# Patient Record
Sex: Female | Born: 1945 | Race: Black or African American | Hispanic: No | Smoking: Former smoker
Health system: Southern US, Community
[De-identification: ages and names within clinical notes are randomized; demographics above are authoritative.]

## PROBLEM LIST (undated history)

## (undated) DIAGNOSIS — R7303 Prediabetes: Secondary | ICD-10-CM

## (undated) DIAGNOSIS — H409 Unspecified glaucoma: Secondary | ICD-10-CM

## (undated) DIAGNOSIS — I10 Essential (primary) hypertension: Secondary | ICD-10-CM

## (undated) DIAGNOSIS — H269 Unspecified cataract: Secondary | ICD-10-CM

## (undated) DIAGNOSIS — K219 Gastro-esophageal reflux disease without esophagitis: Secondary | ICD-10-CM

## (undated) DIAGNOSIS — M549 Dorsalgia, unspecified: Secondary | ICD-10-CM

## (undated) DIAGNOSIS — Z972 Presence of dental prosthetic device (complete) (partial): Secondary | ICD-10-CM

## (undated) DIAGNOSIS — Z973 Presence of spectacles and contact lenses: Secondary | ICD-10-CM

## (undated) DIAGNOSIS — K08109 Complete loss of teeth, unspecified cause, unspecified class: Secondary | ICD-10-CM

## (undated) DIAGNOSIS — Z96642 Presence of left artificial hip joint: Secondary | ICD-10-CM

## (undated) DIAGNOSIS — Z8711 Personal history of peptic ulcer disease: Secondary | ICD-10-CM

## (undated) DIAGNOSIS — M1990 Unspecified osteoarthritis, unspecified site: Secondary | ICD-10-CM

## (undated) HISTORY — DX: Unspecified cataract: H26.9

## (undated) HISTORY — DX: Presence of left artificial hip joint: Z96.642

## (undated) HISTORY — PX: COLONOSCOPY: SHX174

## (undated) HISTORY — DX: Unspecified glaucoma: H40.9

## (undated) HISTORY — DX: Personal history of peptic ulcer disease: Z87.11

## (undated) HISTORY — PX: TUBAL LIGATION: SHX77

## (undated) HISTORY — PX: COSMETIC SURGERY: SHX468

## (undated) HISTORY — PX: REDUCTION MAMMAPLASTY: SUR839

---

## 2013-04-20 ENCOUNTER — Encounter (HOSPITAL_BASED_OUTPATIENT_CLINIC_OR_DEPARTMENT_OTHER): Payer: Self-pay | Admitting: *Deleted

## 2013-04-20 NOTE — Progress Notes (Signed)
Pt to come in for bmet-ekg-denies any resp or cardiac problems

## 2013-04-21 ENCOUNTER — Encounter (HOSPITAL_BASED_OUTPATIENT_CLINIC_OR_DEPARTMENT_OTHER)
Admission: RE | Admit: 2013-04-21 | Discharge: 2013-04-21 | Disposition: A | Discharge status: Home / Self Care | Payer: Medicare Other | Source: Ambulatory Visit | Attending: Specialist | Admitting: Specialist

## 2013-04-21 ENCOUNTER — Other Ambulatory Visit: Payer: Self-pay

## 2013-04-21 DIAGNOSIS — Z01818 Encounter for other preprocedural examination: Secondary | ICD-10-CM | POA: Insufficient documentation

## 2013-04-21 DIAGNOSIS — Z01810 Encounter for preprocedural cardiovascular examination: Secondary | ICD-10-CM | POA: Insufficient documentation

## 2013-04-21 DIAGNOSIS — Z01812 Encounter for preprocedural laboratory examination: Secondary | ICD-10-CM | POA: Insufficient documentation

## 2013-04-22 LAB — POCT I-STAT, CHEM 8
BUN: 16 mg/dL (ref 6–23)
Calcium, Ion: 1.1 mmol/L — ABNORMAL LOW (ref 1.13–1.30)
HCT: 38 % (ref 36.0–46.0)
Hemoglobin: 12.9 g/dL (ref 12.0–15.0)
Sodium: 141 mEq/L (ref 135–145)
TCO2: 27 mmol/L (ref 0–100)

## 2013-04-25 ENCOUNTER — Encounter (HOSPITAL_BASED_OUTPATIENT_CLINIC_OR_DEPARTMENT_OTHER)
Admission: RE | Disposition: A | Discharge status: Home / Self Care | Payer: Self-pay | Source: Ambulatory Visit | Attending: Specialist

## 2013-04-25 ENCOUNTER — Ambulatory Visit (HOSPITAL_BASED_OUTPATIENT_CLINIC_OR_DEPARTMENT_OTHER)
Admission: RE | Admit: 2013-04-25 | Discharge: 2013-04-25 | Disposition: A | Discharge status: Home / Self Care | Payer: Medicare Other | Source: Ambulatory Visit | Attending: Specialist | Admitting: Specialist

## 2013-04-25 ENCOUNTER — Encounter (HOSPITAL_BASED_OUTPATIENT_CLINIC_OR_DEPARTMENT_OTHER): Payer: Self-pay | Admitting: Anesthesiology

## 2013-04-25 ENCOUNTER — Ambulatory Visit (HOSPITAL_BASED_OUTPATIENT_CLINIC_OR_DEPARTMENT_OTHER): Payer: Medicare Other | Admitting: Anesthesiology

## 2013-04-25 ENCOUNTER — Encounter (HOSPITAL_BASED_OUTPATIENT_CLINIC_OR_DEPARTMENT_OTHER): Payer: Self-pay | Admitting: *Deleted

## 2013-04-25 DIAGNOSIS — N62 Hypertrophy of breast: Secondary | ICD-10-CM | POA: Insufficient documentation

## 2013-04-25 HISTORY — DX: Gastro-esophageal reflux disease without esophagitis: K21.9

## 2013-04-25 HISTORY — DX: Presence of dental prosthetic device (complete) (partial): Z97.2

## 2013-04-25 HISTORY — DX: Dorsalgia, unspecified: M54.9

## 2013-04-25 HISTORY — DX: Presence of spectacles and contact lenses: Z97.3

## 2013-04-25 HISTORY — DX: Complete loss of teeth, unspecified cause, unspecified class: K08.109

## 2013-04-25 HISTORY — DX: Essential (primary) hypertension: I10

## 2013-04-25 HISTORY — PX: BREAST REDUCTION SURGERY: SHX8

## 2013-04-25 HISTORY — DX: Unspecified osteoarthritis, unspecified site: M19.90

## 2013-04-25 SURGERY — MAMMOPLASTY, REDUCTION
Anesthesia: General | Site: Breast | Laterality: Bilateral | Wound class: Clean

## 2013-04-25 MED ORDER — CEFAZOLIN SODIUM-DEXTROSE 2-3 GM-% IV SOLR
2.0000 g | INTRAVENOUS | Status: AC
  Administered 2013-04-25: 2 g via INTRAVENOUS

## 2013-04-25 MED ORDER — SODIUM CHLORIDE 0.9 % IV SOLN
INTRAVENOUS | Status: DC | PRN
  Administered 2013-04-25: 08:00:00 via INTRAMUSCULAR

## 2013-04-25 MED ORDER — DEXAMETHASONE SODIUM PHOSPHATE 4 MG/ML IJ SOLN
INTRAMUSCULAR | Status: DC | PRN
  Administered 2013-04-25: 10 mg via INTRAVENOUS

## 2013-04-25 MED ORDER — ONDANSETRON HCL 4 MG/2ML IJ SOLN
INTRAMUSCULAR | Status: DC | PRN
  Administered 2013-04-25: 4 mg via INTRAVENOUS

## 2013-04-25 MED ORDER — PHENYLEPHRINE HCL 10 MG/ML IJ SOLN
INTRAMUSCULAR | Status: DC | PRN
  Administered 2013-04-25: 20 ug via INTRAVENOUS

## 2013-04-25 MED ORDER — OXYCODONE HCL 5 MG/5ML PO SOLN: 5.0000 mg | Freq: Once | ORAL | Status: AC | PRN

## 2013-04-25 MED ORDER — HYDROMORPHONE HCL PF 1 MG/ML IJ SOLN
0.2500 mg | INTRAMUSCULAR | Status: DC | PRN
  Administered 2013-04-25: 0.5 mg via INTRAVENOUS

## 2013-04-25 MED ORDER — MIDAZOLAM HCL 5 MG/5ML IJ SOLN
INTRAMUSCULAR | Status: DC | PRN
  Administered 2013-04-25: 1 mg via INTRAVENOUS

## 2013-04-25 MED ORDER — PROPOFOL 10 MG/ML IV BOLUS
INTRAVENOUS | Status: DC | PRN
  Administered 2013-04-25: 120 mg via INTRAVENOUS

## 2013-04-25 MED ORDER — PHENYLEPHRINE HCL 10 MG/ML IJ SOLN
10.0000 mg | INTRAVENOUS | Status: DC | PRN
  Administered 2013-04-25: 50 ug/min via INTRAVENOUS

## 2013-04-25 MED ORDER — MORPHINE SULFATE 10 MG/ML IJ SOLN
INTRAMUSCULAR | Status: DC | PRN
  Administered 2013-04-25: 1 mg via INTRAVENOUS
  Administered 2013-04-25 (×2): 2 mg via INTRAVENOUS

## 2013-04-25 MED ORDER — EPHEDRINE SULFATE 50 MG/ML IJ SOLN
INTRAMUSCULAR | Status: DC | PRN
  Administered 2013-04-25: 10 mg via INTRAVENOUS

## 2013-04-25 MED ORDER — SUCCINYLCHOLINE CHLORIDE 20 MG/ML IJ SOLN
INTRAMUSCULAR | Status: DC | PRN
  Administered 2013-04-25: 80 mg via INTRAVENOUS

## 2013-04-25 MED ORDER — OXYCODONE HCL 5 MG PO TABS
5.0000 mg | ORAL_TABLET | Freq: Once | ORAL | Status: AC | PRN
  Administered 2013-04-25: 5 mg via ORAL

## 2013-04-25 MED ORDER — LACTATED RINGERS IV SOLN
INTRAVENOUS | Status: DC
  Administered 2013-04-25: 07:00:00 via INTRAVENOUS

## 2013-04-25 MED ORDER — LIDOCAINE HCL (CARDIAC) 20 MG/ML IV SOLN
INTRAVENOUS | Status: DC | PRN
  Administered 2013-04-25: 60 mg via INTRAVENOUS

## 2013-04-25 SURGICAL SUPPLY — 59 items
BAG DECANTER FOR FLEXI CONT (MISCELLANEOUS) ×2 IMPLANT
BENZOIN TINCTURE PRP APPL 2/3 (GAUZE/BANDAGES/DRESSINGS) ×4 IMPLANT
BINDER BREAST XLRG (GAUZE/BANDAGES/DRESSINGS) IMPLANT
BINDER BREAST XXLRG (GAUZE/BANDAGES/DRESSINGS) ×2 IMPLANT
BLADE KNIFE  20 PERSONNA (BLADE) ×2
BLADE KNIFE 20 PERSONNA (BLADE) ×2 IMPLANT
BLADE KNIFE PERSONA 15 (BLADE) ×2 IMPLANT
CANISTER SUCTION 1200CC (MISCELLANEOUS) ×2 IMPLANT
CLOTH BEACON ORANGE TIMEOUT ST (SAFETY) ×2 IMPLANT
COVER MAYO STAND STRL (DRAPES) ×2 IMPLANT
COVER TABLE BACK 60X90 (DRAPES) ×2 IMPLANT
DECANTER SPIKE VIAL GLASS SM (MISCELLANEOUS) ×4 IMPLANT
DRAIN CHANNEL 10F 3/8 F FF (DRAIN) ×4 IMPLANT
DRAPE LAPAROSCOPIC ABDOMINAL (DRAPES) ×2 IMPLANT
DRAPE UTILITY XL STRL (DRAPES) ×2 IMPLANT
DRSG PAD ABDOMINAL 8X10 ST (GAUZE/BANDAGES/DRESSINGS) ×8 IMPLANT
ELECT REM PT RETURN 9FT ADLT (ELECTROSURGICAL) ×2
ELECTRODE REM PT RTRN 9FT ADLT (ELECTROSURGICAL) ×1 IMPLANT
EVACUATOR SILICONE 100CC (DRAIN) ×4 IMPLANT
FILTER 7/8 IN (FILTER) IMPLANT
GAUZE XEROFORM 5X9 LF (GAUZE/BANDAGES/DRESSINGS) ×4 IMPLANT
GLOVE BIO SURGEON STRL SZ 6.5 (GLOVE) ×2 IMPLANT
GLOVE BIO SURGEON STRL SZ7 (GLOVE) ×2 IMPLANT
GLOVE BIOGEL M STRL SZ7.5 (GLOVE) IMPLANT
GLOVE BIOGEL PI IND STRL 8 (GLOVE) IMPLANT
GLOVE BIOGEL PI INDICATOR 8 (GLOVE)
GLOVE ECLIPSE 7.0 STRL STRAW (GLOVE) ×2 IMPLANT
GOWN PREVENTION PLUS XXLARGE (GOWN DISPOSABLE) ×4 IMPLANT
IV NS 500ML (IV SOLUTION) ×1
IV NS 500ML BAXH (IV SOLUTION) ×1 IMPLANT
NEEDLE SPNL 18GX3.5 QUINCKE PK (NEEDLE) ×2 IMPLANT
NS IRRIG 1000ML POUR BTL (IV SOLUTION) IMPLANT
PACK BASIN DAY SURGERY FS (CUSTOM PROCEDURE TRAY) ×2 IMPLANT
PEN SKIN MARKING BROAD TIP (MISCELLANEOUS) ×2 IMPLANT
PILLOW FOAM RUBBER ADULT (PILLOWS) ×2 IMPLANT
PIN SAFETY STERILE (MISCELLANEOUS) ×2 IMPLANT
SHEETING SILICONE GEL EPI DERM (MISCELLANEOUS) IMPLANT
SLEEVE SCD COMPRESS KNEE MED (MISCELLANEOUS) ×2 IMPLANT
SLEEVE SURGEON STRL (DRAPES) ×2 IMPLANT
SPECIMEN JAR MEDIUM (MISCELLANEOUS) IMPLANT
SPECIMEN JAR X LARGE (MISCELLANEOUS) ×4 IMPLANT
SPONGE GAUZE 4X4 12PLY (GAUZE/BANDAGES/DRESSINGS) ×4 IMPLANT
SPONGE LAP 18X18 X RAY DECT (DISPOSABLE) ×8 IMPLANT
STRIP SUTURE WOUND CLOSURE 1/2 (SUTURE) ×10 IMPLANT
SUT MNCRL AB 3-0 PS2 18 (SUTURE) ×12 IMPLANT
SUT MON AB 2-0 CT1 36 (SUTURE) IMPLANT
SUT MON AB 5-0 PS2 18 (SUTURE) ×4 IMPLANT
SUT PROLENE 3 0 PS 2 (SUTURE) ×12 IMPLANT
SYR 50ML LL SCALE MARK (SYRINGE) ×4 IMPLANT
SYR CONTROL 10ML LL (SYRINGE) IMPLANT
TAPE HYPAFIX 6X30 (GAUZE/BANDAGES/DRESSINGS) ×2 IMPLANT
TAPE MEASURE 72IN RETRACT (INSTRUMENTS) ×1
TAPE MEASURE LINEN 72IN RETRCT (INSTRUMENTS) ×1 IMPLANT
TAPE PAPER MEDFIX 1IN X 10YD (GAUZE/BANDAGES/DRESSINGS) ×2 IMPLANT
TOWEL OR NON WOVEN STRL DISP B (DISPOSABLE) ×2 IMPLANT
TUBE CONNECTING 20X1/4 (TUBING) ×2 IMPLANT
UNDERPAD 30X30 INCONTINENT (UNDERPADS AND DIAPERS) ×6 IMPLANT
VAC PENCILS W/TUBING CLEAR (MISCELLANEOUS) ×2 IMPLANT
YANKAUER SUCT BULB TIP NO VENT (SUCTIONS) ×4 IMPLANT

## 2013-04-25 NOTE — Transfer of Care (Signed)
Immediate Anesthesia Transfer of Care Note  [name]  Procedure(s) Performed: Procedure(s): MAMMARY REDUCTION  (BREAST) (Bilateral)  Patient Location: PACU  Anesthesia Type:General  Level of Consciousness: awake and sedated  Airway & Oxygen Therapy: Patient Spontanous Breathing and Patient connected to face mask oxygen  Post-op Assessment: Report given to PACU RN and Post -op Vital signs reviewed and stable  Post vital signs: Reviewed and stable  Complications: No apparent anesthesia complications

## 2013-04-25 NOTE — Op Note (Signed)
NAMEGOWRI, SUCHAN                   ACCOUNT NO.:  [id number]  MEDICAL RECORD NO.:  [id number]  LOCATION:                               FACILITY:  [facility]  PHYSICIAN:  Earvin Hansen L. Shon Hough, M.D.DATE OF BIRTH:  Feb 20, 1946  DATE OF PROCEDURE:  04/25/2013 DATE OF DISCHARGE:  04/25/2013                              OPERATIVE REPORT   INDICATIONS FOR PROCEDURE:  A 67 year old lady with severe macromastia, back and shoulder pain secondary to large pendulous breasts, as well as increased accessory breast tissue.  PROCEDURES DONE:  Bilateral breast reductions using the inferior pedicle technique, excision of accessory breast tissue bilaterally.  ANESTHESIA:  General.  DESCRIPTION OF PROCEDURE:  Preoperatively, the patient sat up and drawn for the reduction mammoplasty reducing the distance from the nipple- areolar complex to the suprasternal notch from over 40 cm to 25.  She underwent general anesthesia intubated orally and prep was done to the chest and breast areas in routine fashion using Hibiclens soap and solution walled off with sterile towels and drapes so as to make a sterile field.  One 4% Xylocaine with epinephrine 1:400,000 concentration was injected locally, a total of 200 mL per side.  This was allowed to set up.  The wounds were scored with #15 blade and the skin of the inferior pedicle was de-epithelialized with #20 blades. Medial and lateral fatty dermal pedicles were incised down the underlying pectoralis major fascia.  After proper hemostasis, the new keyhole area was also debulked, as well as the inferior pedicle.  After proper hemostasis, the flaps were transposed and stayed with 3-0 Monocryl sutures.  Subcutaneous closure was done with 3-0 Monocryl x2 layers and running subcuticular stitch of 3-0 Monocryl and 5-0 Monocryl throughout the inverted T.  The wounds were drained with #10 fully fluted Blake drains which were placed in the drape in the lateral portion of the  incision and secured with 3-0 Prolene.  The patient underwent the procedures very well.  ESTIMATED BLOOD LOSS:  Less than 100 mL.  The wounds were cleansed and covered with half-inch Steri-Strips, Xeroform 4x4s, ABD pads.  Nipple- areolar complexes at the end of procedure showing excellent suppleness and blood supply pinkness.  She was taken to recovery in excellent condition.  ESTIMATED BLOOD LOSS:  Less than 150 mL.  COMPLICATIONS:  None.     Yaakov Guthrie. Shon Hough, M.D.   ______________________________ Yaakov Guthrie. Shon Hough, M.D.    Cathie Hoops  D:  04/25/2013  T:  04/25/2013  Job:  [id number]

## 2013-04-25 NOTE — Anesthesia Preprocedure Evaluation (Signed)
Anesthesia Evaluation  Patient identified by MRN, date of birth, ID band Patient awake    Reviewed: Allergy & Precautions, H&P , NPO status , Patient's Chart, lab work & pertinent test results  Airway Mallampati: II TM Distance: >3 FB Neck ROM: Full    Dental no notable dental hx. (+) Edentulous Upper, Edentulous Lower and Dental Advisory Given   Pulmonary neg pulmonary ROS,  breath sounds clear to auscultation  Pulmonary exam normal       Cardiovascular hypertension, On Medications Rhythm:Regular Rate:Normal     Neuro/Psych negative neurological ROS  negative psych ROS   GI/Hepatic Neg liver ROS, GERD-  Medicated and Controlled,  Endo/Other  negative endocrine ROS  Renal/GU negative Renal ROS  negative genitourinary   Musculoskeletal   Abdominal   Peds  Hematology negative hematology ROS (+)   Anesthesia Other Findings   Reproductive/Obstetrics negative OB ROS                           Anesthesia Physical Anesthesia Plan  ASA: II  Anesthesia Plan: General   Post-op Pain Management:    Induction: Intravenous  Airway Management Planned: Oral ETT  Additional Equipment:   Intra-op Plan:   Post-operative Plan: Extubation in OR  Informed Consent: I have reviewed the patients History and Physical, chart, labs and discussed the procedure including the risks, benefits and alternatives for the proposed anesthesia with the patient or authorized representative who has indicated his/her understanding and acceptance.   Dental advisory given  Plan Discussed with: CRNA  Anesthesia Plan Comments:         Anesthesia Quick Evaluation

## 2013-04-25 NOTE — Anesthesia Postprocedure Evaluation (Signed)
  Anesthesia Post-op Note  [name]  Procedure(s) Performed: Procedure(s): MAMMARY REDUCTION  (BREAST) (Bilateral)  Patient Location: PACU  Anesthesia Type:General  Level of Consciousness: awake, alert  and oriented  Airway and Oxygen Therapy: Patient Spontanous Breathing  Post-op Pain: none  Post-op Assessment: Post-op Vital signs reviewed, Patient's Cardiovascular Status Stable, Respiratory Function Stable, Patent Airway and No signs of Nausea or vomiting  Post-op Vital Signs: Reviewed and stable  Complications: No apparent anesthesia complications

## 2013-04-25 NOTE — Anesthesia Procedure Notes (Signed)
Procedure Name: Intubation Performed by: York Grice Pre-anesthesia Checklist: Patient identified, Timeout performed, Emergency Drugs available, Suction available and Patient being monitored Patient Re-evaluated:Patient Re-evaluated prior to inductionOxygen Delivery Method: Circle system utilized Preoxygenation: Pre-oxygenation with 100% oxygen Intubation Type: IV induction Ventilation: Mask ventilation without difficulty Laryngoscope Size: Miller and 2 Grade View: Grade I Tube type: Oral Tube size: 7.0 mm Number of attempts: 1 Airway Equipment and Method: Stylet Placement Confirmation: positive ETCO2,  breath sounds checked- equal and bilateral and ETT inserted through vocal cords under direct vision Secured at: 22 cm Tube secured with: Tape Dental Injury: Teeth and Oropharynx as per pre-operative assessment

## 2013-04-25 NOTE — H&P (Signed)
Tiffany Bradley is an 67 y.o. female.   Chief Complaint: Increased macromastia HPI: Increased back and shoulder pain increased accessory breast tissue  Past Medical History  Diagnosis Date  . Arthritis   . Hypertension   . Back pain   . DJD (degenerative joint disease)   . GERD (gastroesophageal reflux disease)   . Wears glasses   . Full dentures     Past Surgical History  Procedure Laterality Date  . Tubal ligation    . Colonoscopy      History reviewed. No pertinent family history. Social History:  reports that she quit smoking about 20 years ago. She does not have any smokeless tobacco history on file. She reports that  drinks alcohol. She reports that she does not use illicit drugs.  Allergies: No Known Allergies  Medications Prior to Admission  Medication Sig Dispense Refill  . acetaminophen-codeine (TYLENOL #3) 300-30 MG per tablet Take 1 tablet by mouth every 4 (four) hours as needed for pain.      Marland Kitchen amLODipine (NORVASC) 10 MG tablet Take 10 mg by mouth daily.      . cyclobenzaprine (FLEXERIL) 10 MG tablet Take 10 mg by mouth 3 (three) times daily as needed for muscle spasms.      Marland Kitchen ibuprofen (ADVIL,MOTRIN) 800 MG tablet Take 800 mg by mouth every 8 (eight) hours as needed for pain.      Marland Kitchen omeprazole (PRILOSEC) 20 MG capsule Take 20 mg by mouth daily.        No results found for this or any previous visit (from the past 48 hour(s)). No results found.  Review of Systems  Constitutional: Negative.   HENT: Positive for neck pain.   Eyes: Negative.   Respiratory: Negative.   Cardiovascular: Negative.   Gastrointestinal: Positive for heartburn.  Genitourinary: Negative.   Musculoskeletal: Positive for back pain.  Skin: Positive for rash.  Neurological: Negative.   Endo/Heme/Allergies: Negative.   Psychiatric/Behavioral: Negative.     Blood pressure 139/99, pulse 94, temperature 98.1 F (36.7 C), temperature source Oral, height 5\' 6"  (1.676 m), weight 82.328 kg (181  lb 8 oz), SpO2 99.00%. Physical Exam   Assessment/Plan Severe macromastia for bilateral breast reductions and excision of accessory breast tissue  Zeke Aker L 04/25/2013, 7:25 AM

## 2013-04-25 NOTE — Brief Op Note (Signed)
04/25/2013  10:21 AM  PATIENT:  Tiffany Bradley  67 y.o. female  PRE-OPERATIVE DIAGNOSIS:  MACROMASTIA  POST-OPERATIVE DIAGNOSIS:  MACROMASTIA  PROCEDURE:  Procedure(s): MAMMARY REDUCTION  (BREAST) (Bilateral)  SURGEON:  Surgeon(s) and Role:    * Louisa Second, MD - Primary  PHYSICIAN ASSISTANT:   ASSISTANTS: none   ANESTHESIA:   general  EBL:  Total I/O In: 1300 [I.V.:1300] Out: -   BLOOD ADMINISTERED:none  DRAINS: (right and left lateral chest areas) Jackson-Pratt drain(s) with closed bulb suction in the right and left lateral chest areas   LOCAL MEDICATIONS USED:  LIDOCAINE   SPECIMEN:  Excision  DISPOSITION OF SPECIMEN:  PATHOLOGY  COUNTS:  YES  TOURNIQUET:  * No tourniquets in log *  DICTATION: .Other Dictation: Dictation Number [id number]  PLAN OF CARE: Discharge to home after PACU  PATIENT DISPOSITION:  PACU - hemodynamically stable.   Delay start of Pharmacological VTE agent (>24hrs) due to surgical blood loss or risk of bleeding: yes

## 2013-04-26 ENCOUNTER — Encounter (HOSPITAL_BASED_OUTPATIENT_CLINIC_OR_DEPARTMENT_OTHER): Payer: Self-pay | Admitting: Specialist

## 2015-07-09 DIAGNOSIS — M059 Rheumatoid arthritis with rheumatoid factor, unspecified: Secondary | ICD-10-CM | POA: Diagnosis not present

## 2015-07-09 DIAGNOSIS — M1990 Unspecified osteoarthritis, unspecified site: Secondary | ICD-10-CM | POA: Diagnosis not present

## 2015-07-23 DIAGNOSIS — M65842 Other synovitis and tenosynovitis, left hand: Secondary | ICD-10-CM | POA: Diagnosis not present

## 2015-07-23 DIAGNOSIS — M179 Osteoarthritis of knee, unspecified: Secondary | ICD-10-CM | POA: Diagnosis not present

## 2015-09-26 DIAGNOSIS — M170 Bilateral primary osteoarthritis of knee: Secondary | ICD-10-CM | POA: Diagnosis not present

## 2015-09-26 DIAGNOSIS — M25562 Pain in left knee: Secondary | ICD-10-CM | POA: Diagnosis not present

## 2015-11-08 DIAGNOSIS — H40013 Open angle with borderline findings, low risk, bilateral: Secondary | ICD-10-CM | POA: Diagnosis not present

## 2015-11-08 DIAGNOSIS — Z83511 Family history of glaucoma: Secondary | ICD-10-CM | POA: Diagnosis not present

## 2015-12-27 DIAGNOSIS — M170 Bilateral primary osteoarthritis of knee: Secondary | ICD-10-CM | POA: Diagnosis not present

## 2016-03-11 DIAGNOSIS — I10 Essential (primary) hypertension: Secondary | ICD-10-CM | POA: Diagnosis not present

## 2016-03-11 DIAGNOSIS — R69 Illness, unspecified: Secondary | ICD-10-CM | POA: Diagnosis not present

## 2016-03-11 DIAGNOSIS — M1990 Unspecified osteoarthritis, unspecified site: Secondary | ICD-10-CM | POA: Diagnosis not present

## 2016-03-11 DIAGNOSIS — K219 Gastro-esophageal reflux disease without esophagitis: Secondary | ICD-10-CM | POA: Diagnosis not present

## 2016-03-17 DIAGNOSIS — Z0000 Encounter for general adult medical examination without abnormal findings: Secondary | ICD-10-CM | POA: Diagnosis not present

## 2016-03-17 DIAGNOSIS — E785 Hyperlipidemia, unspecified: Secondary | ICD-10-CM | POA: Diagnosis not present

## 2016-04-02 DIAGNOSIS — M1711 Unilateral primary osteoarthritis, right knee: Secondary | ICD-10-CM | POA: Diagnosis not present

## 2016-04-02 DIAGNOSIS — M1712 Unilateral primary osteoarthritis, left knee: Secondary | ICD-10-CM | POA: Diagnosis not present

## 2016-05-19 DIAGNOSIS — Z0000 Encounter for general adult medical examination without abnormal findings: Secondary | ICD-10-CM | POA: Diagnosis not present

## 2016-05-19 DIAGNOSIS — K219 Gastro-esophageal reflux disease without esophagitis: Secondary | ICD-10-CM | POA: Diagnosis not present

## 2016-05-19 DIAGNOSIS — I10 Essential (primary) hypertension: Secondary | ICD-10-CM | POA: Diagnosis not present

## 2016-06-28 DIAGNOSIS — R350 Frequency of micturition: Secondary | ICD-10-CM | POA: Diagnosis not present

## 2016-06-28 DIAGNOSIS — R3915 Urgency of urination: Secondary | ICD-10-CM | POA: Diagnosis not present

## 2016-06-28 DIAGNOSIS — Z87891 Personal history of nicotine dependence: Secondary | ICD-10-CM | POA: Diagnosis not present

## 2016-06-28 DIAGNOSIS — R300 Dysuria: Secondary | ICD-10-CM | POA: Diagnosis not present

## 2016-06-28 DIAGNOSIS — I10 Essential (primary) hypertension: Secondary | ICD-10-CM | POA: Diagnosis not present

## 2016-06-28 DIAGNOSIS — R1013 Epigastric pain: Secondary | ICD-10-CM | POA: Diagnosis not present

## 2016-06-28 DIAGNOSIS — R109 Unspecified abdominal pain: Secondary | ICD-10-CM | POA: Diagnosis not present

## 2016-06-29 ENCOUNTER — Emergency Department (HOSPITAL_COMMUNITY): Payer: Medicare HMO | Admitting: Anesthesiology

## 2016-06-29 ENCOUNTER — Emergency Department (HOSPITAL_COMMUNITY): Payer: Medicare HMO

## 2016-06-29 ENCOUNTER — Inpatient Hospital Stay (HOSPITAL_COMMUNITY)
Admission: EM | Admit: 2016-06-29 | Discharge: 2016-07-04 | DRG: 330 | Disposition: A | Discharge status: Home / Self Care | Payer: Medicare HMO | Attending: Surgery | Admitting: Surgery

## 2016-06-29 ENCOUNTER — Encounter (HOSPITAL_COMMUNITY): Payer: Self-pay | Admitting: Emergency Medicine

## 2016-06-29 ENCOUNTER — Encounter (HOSPITAL_COMMUNITY)
Admission: EM | Disposition: A | Discharge status: Home / Self Care | Payer: Self-pay | Source: Home / Self Care | Attending: Surgery

## 2016-06-29 DIAGNOSIS — R1013 Epigastric pain: Secondary | ICD-10-CM | POA: Diagnosis not present

## 2016-06-29 DIAGNOSIS — I10 Essential (primary) hypertension: Secondary | ICD-10-CM | POA: Diagnosis present

## 2016-06-29 DIAGNOSIS — Z79899 Other long term (current) drug therapy: Secondary | ICD-10-CM

## 2016-06-29 DIAGNOSIS — J9811 Atelectasis: Secondary | ICD-10-CM | POA: Diagnosis not present

## 2016-06-29 DIAGNOSIS — Z01811 Encounter for preprocedural respiratory examination: Secondary | ICD-10-CM | POA: Diagnosis not present

## 2016-06-29 DIAGNOSIS — Q438 Other specified congenital malformations of intestine: Secondary | ICD-10-CM | POA: Diagnosis not present

## 2016-06-29 DIAGNOSIS — M1990 Unspecified osteoarthritis, unspecified site: Secondary | ICD-10-CM | POA: Diagnosis not present

## 2016-06-29 DIAGNOSIS — K631 Perforation of intestine (nontraumatic): Secondary | ICD-10-CM | POA: Diagnosis not present

## 2016-06-29 DIAGNOSIS — K5909 Other constipation: Secondary | ICD-10-CM | POA: Diagnosis not present

## 2016-06-29 DIAGNOSIS — K219 Gastro-esophageal reflux disease without esophagitis: Secondary | ICD-10-CM | POA: Diagnosis present

## 2016-06-29 DIAGNOSIS — K529 Noninfective gastroenteritis and colitis, unspecified: Secondary | ICD-10-CM | POA: Diagnosis not present

## 2016-06-29 DIAGNOSIS — K567 Ileus, unspecified: Secondary | ICD-10-CM | POA: Diagnosis not present

## 2016-06-29 DIAGNOSIS — K562 Volvulus: Secondary | ICD-10-CM | POA: Diagnosis not present

## 2016-06-29 DIAGNOSIS — R109 Unspecified abdominal pain: Secondary | ICD-10-CM | POA: Diagnosis not present

## 2016-06-29 DIAGNOSIS — Z87891 Personal history of nicotine dependence: Secondary | ICD-10-CM | POA: Diagnosis not present

## 2016-06-29 DIAGNOSIS — R000 Tachycardia, unspecified: Secondary | ICD-10-CM | POA: Diagnosis not present

## 2016-06-29 DIAGNOSIS — N281 Cyst of kidney, acquired: Secondary | ICD-10-CM | POA: Diagnosis not present

## 2016-06-29 HISTORY — PX: PARTIAL COLECTOMY: SHX5273

## 2016-06-29 HISTORY — DX: Perforation of intestine (nontraumatic): K63.1

## 2016-06-29 LAB — URINE MICROSCOPIC-ADD ON

## 2016-06-29 LAB — COMPREHENSIVE METABOLIC PANEL
ALT: 26 U/L (ref 14–54)
AST: 27 U/L (ref 15–41)
Albumin: 3.8 g/dL (ref 3.5–5.0)
Alkaline Phosphatase: 87 U/L (ref 38–126)
Anion gap: 10 (ref 5–15)
BUN: 13 mg/dL (ref 6–20)
CO2: 25 mmol/L (ref 22–32)
Calcium: 8.7 mg/dL — ABNORMAL LOW (ref 8.9–10.3)
Chloride: 102 mmol/L (ref 101–111)
Creatinine, Ser: 0.67 mg/dL (ref 0.44–1.00)
GFR calc Af Amer: >60 mL/min (ref >60)
GFR calc non Af Amer: >60 mL/min (ref >60)
Glucose, Bld: 138 mg/dL — ABNORMAL HIGH (ref 65–99)
Potassium: 3.1 mmol/L — ABNORMAL LOW (ref 3.5–5.1)
Sodium: 137 mmol/L (ref 135–145)
Total Bilirubin: 1.2 mg/dL (ref 0.3–1.2)
Total Protein: 7.6 g/dL (ref 6.5–8.1)

## 2016-06-29 LAB — URINALYSIS, ROUTINE W REFLEX MICROSCOPIC
Bilirubin Urine: NEGATIVE
Glucose, UA: NEGATIVE mg/dL
Hgb urine dipstick: NEGATIVE
Ketones, ur: NEGATIVE mg/dL
Leukocytes, UA: NEGATIVE
Nitrite: NEGATIVE
Protein, ur: 100 mg/dL — AB
Specific Gravity, Urine: 1.015 (ref 1.005–1.030)
pH: 7 (ref 5.0–8.0)

## 2016-06-29 LAB — CBC
HCT: 35.6 % — ABNORMAL LOW (ref 36.0–46.0)
Hemoglobin: 11.8 g/dL — ABNORMAL LOW (ref 12.0–15.0)
MCH: 26.4 pg (ref 26.0–34.0)
MCHC: 33.1 g/dL (ref 30.0–36.0)
MCV: 79.6 fL (ref 78.0–100.0)
Platelets: 286 10*3/uL (ref 150–400)
RBC: 4.47 MIL/uL (ref 3.87–5.11)
RDW: 15.9 % — ABNORMAL HIGH (ref 11.5–15.5)
WBC: 10 10*3/uL (ref 4.0–10.5)

## 2016-06-29 LAB — TYPE AND SCREEN
ABO/RH(D): O POS
Antibody Screen: NEGATIVE

## 2016-06-29 LAB — CBG MONITORING, ED: Glucose-Capillary: 145 mg/dL — ABNORMAL HIGH (ref 65–99)

## 2016-06-29 LAB — LIPASE, BLOOD: Lipase: 16 U/L (ref 11–51)

## 2016-06-29 SURGERY — COLECTOMY, PARTIAL
Anesthesia: General | Site: Abdomen

## 2016-06-29 MED ORDER — GLYCOPYRROLATE 0.2 MG/ML IJ SOLN
INTRAMUSCULAR | Status: DC | PRN
  Administered 2016-06-29: 0.4 mg via INTRAVENOUS

## 2016-06-29 MED ORDER — FAMOTIDINE IN NACL 20-0.9 MG/50ML-% IV SOLN
20.0000 mg | Freq: Two times a day (BID) | INTRAVENOUS | Status: DC
  Administered 2016-06-30 – 2016-07-01 (×5): 20 mg via INTRAVENOUS
  Filled 2016-06-29 (×10): qty 50

## 2016-06-29 MED ORDER — POTASSIUM CHLORIDE CRYS ER 20 MEQ PO TBCR
60.0000 meq | EXTENDED_RELEASE_TABLET | Freq: Once | ORAL | Status: AC
  Administered 2016-06-29: 60 meq via ORAL
  Filled 2016-06-29: qty 3

## 2016-06-29 MED ORDER — METRONIDAZOLE IN NACL 5-0.79 MG/ML-% IV SOLN
500.0000 mg | Freq: Once | INTRAVENOUS | Status: AC
  Administered 2016-06-29: 500 mg via INTRAVENOUS
  Filled 2016-06-29: qty 100

## 2016-06-29 MED ORDER — FENTANYL CITRATE (PF) 100 MCG/2ML IJ SOLN
INTRAMUSCULAR | Status: DC | PRN
  Administered 2016-06-29: 50 ug via INTRAVENOUS
  Administered 2016-06-29 (×3): 25 ug via INTRAVENOUS

## 2016-06-29 MED ORDER — POVIDONE-IODINE 10 % EX OINT
TOPICAL_OINTMENT | CUTANEOUS | Status: AC
  Filled 2016-06-29: qty 1

## 2016-06-29 MED ORDER — FENTANYL CITRATE (PF) 250 MCG/5ML IJ SOLN
INTRAMUSCULAR | Status: AC
  Filled 2016-06-29: qty 5

## 2016-06-29 MED ORDER — BUPIVACAINE LIPOSOME 1.3 % IJ SUSP
INTRAMUSCULAR | Status: DC | PRN
  Administered 2016-06-29: 20 mL

## 2016-06-29 MED ORDER — SUCCINYLCHOLINE CHLORIDE 20 MG/ML IJ SOLN
INTRAMUSCULAR | Status: AC
  Filled 2016-06-29: qty 1

## 2016-06-29 MED ORDER — 0.9 % SODIUM CHLORIDE (POUR BTL) OPTIME
TOPICAL | Status: DC | PRN
  Administered 2016-06-29: 2000 mL

## 2016-06-29 MED ORDER — HYDROMORPHONE HCL 1 MG/ML IJ SOLN
1.0000 mg | Freq: Once | INTRAMUSCULAR | Status: AC
  Administered 2016-06-29: 1 mg via INTRAVENOUS
  Filled 2016-06-29: qty 1

## 2016-06-29 MED ORDER — PHENYLEPHRINE 40 MCG/ML (10ML) SYRINGE FOR IV PUSH (FOR BLOOD PRESSURE SUPPORT)
PREFILLED_SYRINGE | INTRAVENOUS | Status: AC
  Filled 2016-06-29: qty 10

## 2016-06-29 MED ORDER — PROPOFOL 10 MG/ML IV BOLUS
INTRAVENOUS | Status: AC
  Filled 2016-06-29: qty 20

## 2016-06-29 MED ORDER — POVIDONE-IODINE 10 % OINT PACKET
TOPICAL_OINTMENT | CUTANEOUS | Status: DC | PRN
  Administered 2016-06-29: 1 via TOPICAL

## 2016-06-29 MED ORDER — SODIUM CHLORIDE 0.9 % IJ SOLN
INTRAMUSCULAR | Status: AC
  Filled 2016-06-29: qty 10

## 2016-06-29 MED ORDER — PROPOFOL 10 MG/ML IV BOLUS
INTRAVENOUS | Status: DC | PRN
  Administered 2016-06-29: 100 mg via INTRAVENOUS

## 2016-06-29 MED ORDER — MIDAZOLAM HCL 2 MG/2ML IJ SOLN
INTRAMUSCULAR | Status: DC | PRN
  Administered 2016-06-29: 2 mg via INTRAVENOUS

## 2016-06-29 MED ORDER — MIDAZOLAM HCL 2 MG/2ML IJ SOLN
INTRAMUSCULAR | Status: AC
  Filled 2016-06-29: qty 2

## 2016-06-29 MED ORDER — CHLORHEXIDINE GLUCONATE CLOTH 2 % EX PADS: 6.0000 | MEDICATED_PAD | Freq: Once | CUTANEOUS | Status: DC

## 2016-06-29 MED ORDER — IOPAMIDOL (ISOVUE-300) INJECTION 61%
100.0000 mL | Freq: Once | INTRAVENOUS | Status: AC | PRN
  Administered 2016-06-29: 100 mL via INTRAVENOUS

## 2016-06-29 MED ORDER — NEOSTIGMINE METHYLSULFATE 10 MG/10ML IV SOLN
INTRAVENOUS | Status: AC
Start: 2016-06-29 — End: 2016-06-29
  Filled 2016-06-29: qty 2

## 2016-06-29 MED ORDER — ONDANSETRON HCL 4 MG/2ML IJ SOLN
INTRAMUSCULAR | Status: AC
  Filled 2016-06-29: qty 2

## 2016-06-29 MED ORDER — LIDOCAINE HCL (PF) 1 % IJ SOLN
INTRAMUSCULAR | Status: AC
  Filled 2016-06-29: qty 5

## 2016-06-29 MED ORDER — BUPIVACAINE LIPOSOME 1.3 % IJ SUSP
INTRAMUSCULAR | Status: AC
  Filled 2016-06-29: qty 20

## 2016-06-29 MED ORDER — CEFOTETAN DISODIUM-DEXTROSE 2-2.08 GM-% IV SOLR: 2.0000 g | INTRAVENOUS | Status: DC

## 2016-06-29 MED ORDER — ROCURONIUM BROMIDE 50 MG/5ML IV SOLN
INTRAVENOUS | Status: AC
  Filled 2016-06-29: qty 1

## 2016-06-29 MED ORDER — EPHEDRINE SULFATE 50 MG/ML IJ SOLN
INTRAMUSCULAR | Status: AC
  Filled 2016-06-29: qty 1

## 2016-06-29 MED ORDER — IOPAMIDOL (ISOVUE-300) INJECTION 61%
15.0000 mL | Freq: Once | INTRAVENOUS | Status: AC | PRN
  Administered 2016-06-29: 15 mL via ORAL

## 2016-06-29 MED ORDER — ONDANSETRON HCL 4 MG/2ML IJ SOLN
4.0000 mg | Freq: Once | INTRAMUSCULAR | Status: AC
  Administered 2016-06-29: 4 mg via INTRAVENOUS
  Filled 2016-06-29: qty 2

## 2016-06-29 MED ORDER — ETOMIDATE 2 MG/ML IV SOLN
INTRAVENOUS | Status: AC
  Filled 2016-06-29: qty 10

## 2016-06-29 MED ORDER — LACTATED RINGERS IV SOLN
INTRAVENOUS | Status: DC | PRN
  Administered 2016-06-29 (×2): via INTRAVENOUS

## 2016-06-29 MED ORDER — GLYCOPYRROLATE 0.2 MG/ML IJ SOLN
INTRAMUSCULAR | Status: AC
  Filled 2016-06-29: qty 4

## 2016-06-29 MED ORDER — SUCCINYLCHOLINE CHLORIDE 20 MG/ML IJ SOLN
INTRAMUSCULAR | Status: DC | PRN
  Administered 2016-06-29: 120 mg via INTRAVENOUS

## 2016-06-29 MED ORDER — ROCURONIUM BROMIDE 100 MG/10ML IV SOLN
INTRAVENOUS | Status: DC | PRN
  Administered 2016-06-29: 5 mg via INTRAVENOUS
  Administered 2016-06-29: 25 mg via INTRAVENOUS

## 2016-06-29 MED ORDER — LIDOCAINE HCL (CARDIAC) 10 MG/ML IV SOLN
INTRAVENOUS | Status: DC | PRN
  Administered 2016-06-29: 50 mg via INTRAVENOUS

## 2016-06-29 MED ORDER — ONDANSETRON HCL 4 MG/2ML IJ SOLN
INTRAMUSCULAR | Status: DC | PRN
  Administered 2016-06-29: 4 mg via INTRAVENOUS

## 2016-06-29 MED ORDER — CIPROFLOXACIN IN D5W 400 MG/200ML IV SOLN
400.0000 mg | Freq: Once | INTRAVENOUS | Status: AC
  Administered 2016-06-29: 400 mg via INTRAVENOUS
  Filled 2016-06-29: qty 200

## 2016-06-29 MED ORDER — NEOSTIGMINE METHYLSULFATE 10 MG/10ML IV SOLN
INTRAVENOUS | Status: DC | PRN
  Administered 2016-06-29: 3 mg via INTRAVENOUS

## 2016-06-29 MED ORDER — PHENYLEPHRINE HCL 10 MG/ML IJ SOLN
INTRAMUSCULAR | Status: DC | PRN
  Administered 2016-06-29 (×2): 40 ug via INTRAVENOUS

## 2016-06-29 SURGICAL SUPPLY — 48 items
BAG HAMPER (MISCELLANEOUS) ×3 IMPLANT
CHLORAPREP W/TINT 26ML (MISCELLANEOUS) ×3 IMPLANT
CLOTH BEACON ORANGE TIMEOUT ST (SAFETY) ×3 IMPLANT
COVER LIGHT HANDLE STERIS (MISCELLANEOUS) ×6 IMPLANT
COVER MAYO STAND XLG (DRAPE) ×3 IMPLANT
DRAPE UTILITY W/TAPE 26X15 (DRAPES) ×6 IMPLANT
DRAPE WARM FLUID 44X44 (DRAPE) ×3 IMPLANT
DRSG OPSITE POSTOP 4X10 (GAUZE/BANDAGES/DRESSINGS) IMPLANT
DRSG OPSITE POSTOP 4X8 (GAUZE/BANDAGES/DRESSINGS) ×3 IMPLANT
ELECT BLADE 6 FLAT ULTRCLN (ELECTRODE) ×3 IMPLANT
ELECT REM PT RETURN 9FT ADLT (ELECTROSURGICAL) ×3
ELECTRODE REM PT RTRN 9FT ADLT (ELECTROSURGICAL) ×2 IMPLANT
GLOVE BIOGEL PI IND STRL 7.0 (GLOVE) ×10 IMPLANT
GLOVE BIOGEL PI IND STRL 7.5 (GLOVE) ×4 IMPLANT
GLOVE BIOGEL PI INDICATOR 7.0 (GLOVE) ×5
GLOVE BIOGEL PI INDICATOR 7.5 (GLOVE) ×2
GLOVE ECLIPSE 7.0 STRL STRAW (GLOVE) ×6 IMPLANT
GLOVE EXAM NITRILE MD LF STRL (GLOVE) ×3 IMPLANT
GLOVE SURG SS PI 7.0 STRL IVOR (GLOVE) ×6 IMPLANT
GOWN STRL REUS W/TWL LRG LVL3 (GOWN DISPOSABLE) ×15 IMPLANT
HANDLE SUCTION POOLE (INSTRUMENTS) ×2 IMPLANT
INST SET MAJOR GENERAL (KITS) ×3 IMPLANT
KIT BLADEGUARD II DBL (SET/KITS/TRAYS/PACK) ×3 IMPLANT
KIT ROOM TURNOVER APOR (KITS) ×3 IMPLANT
LIGASURE IMPACT 36 18CM CVD LR (INSTRUMENTS) ×3 IMPLANT
MANIFOLD NEPTUNE II (INSTRUMENTS) ×3 IMPLANT
NEEDLE HYPO 18GX1.5 BLUNT FILL (NEEDLE) ×3 IMPLANT
NEEDLE HYPO 21X1.5 SAFETY (NEEDLE) ×3 IMPLANT
NS IRRIG 1000ML POUR BTL (IV SOLUTION) ×6 IMPLANT
PACK ABDOMINAL MAJOR (CUSTOM PROCEDURE TRAY) ×3 IMPLANT
PAD ARMBOARD 7.5X6 YLW CONV (MISCELLANEOUS) ×3 IMPLANT
PENCIL HANDSWITCHING (ELECTRODE) ×3 IMPLANT
RELOAD LINEAR CUT PROX 55 BLUE (ENDOMECHANICALS) ×12 IMPLANT
RETRACTOR WND ALEXIS 25 LRG (MISCELLANEOUS) ×2 IMPLANT
RTRCTR WOUND ALEXIS 25CM LRG (MISCELLANEOUS) ×3
SET BASIN LINEN APH (SET/KITS/TRAYS/PACK) ×3 IMPLANT
SPONGE LAP 18X18 X RAY DECT (DISPOSABLE) ×3 IMPLANT
STAPLER PROXIMATE 55 BLUE (STAPLE) ×3 IMPLANT
STAPLER VISISTAT (STAPLE) ×3 IMPLANT
SUCTION POOLE HANDLE (INSTRUMENTS) ×3
SUCTION YANKAUER HANDLE (MISCELLANEOUS) ×3 IMPLANT
SUT PDS AB 0 CTX 60 (SUTURE) ×6 IMPLANT
SUT SILK 3 0 SH CR/8 (SUTURE) ×3 IMPLANT
SYR 20CC LL (SYRINGE) ×3 IMPLANT
SYRINGE 10CC LL (SYRINGE) ×3 IMPLANT
TOWEL BLUE STERILE X RAY DET (MISCELLANEOUS) ×3 IMPLANT
TOWEL OR 17X26 4PK STRL BLUE (TOWEL DISPOSABLE) ×3 IMPLANT
TRAY FOLEY W/METER SILVER 16FR (SET/KITS/TRAYS/PACK) ×3 IMPLANT

## 2016-06-29 NOTE — Anesthesia Preprocedure Evaluation (Signed)
Anesthesia Evaluation  Patient identified by MRN, date of birth, ID band Patient awake    Reviewed: Allergy & Precautions, H&P , NPO status , Patient's Chart, lab work & pertinent test results  Airway Mallampati: II  TM Distance: >3 FB Neck ROM: Full    Dental no notable dental hx. (+) Edentulous Upper, Edentulous Lower   Pulmonary neg pulmonary ROS, former smoker,    Pulmonary exam normal breath sounds clear to auscultation       Cardiovascular hypertension, Pt. on medications  Rhythm:Regular Rate:Normal     Neuro/Psych negative neurological ROS  negative psych ROS   GI/Hepatic Neg liver ROS, GERD  Medicated and Controlled,  Endo/Other  negative endocrine ROS  Renal/GU      Musculoskeletal  (+) Arthritis ,   Abdominal   Peds  Hematology negative hematology ROS (+)   Anesthesia Other Findings   Reproductive/Obstetrics negative OB ROS                             Anesthesia Physical Anesthesia Plan  ASA: II and emergent  Anesthesia Plan: General   Post-op Pain Management:    Induction: Rapid sequence, Cricoid pressure planned and Intravenous  Airway Management Planned: Oral ETT  Additional Equipment:   Intra-op Plan:   Post-operative Plan: Extubation in OR  Informed Consent: I have reviewed the patients History and Physical, chart, labs and discussed the procedure including the risks, benefits and alternatives for the proposed anesthesia with the patient or authorized representative who has indicated his/her understanding and acceptance.     Plan Discussed with: Surgeon  Anesthesia Plan Comments:         Anesthesia Quick Evaluation

## 2016-06-29 NOTE — ED Notes (Signed)
Report received 

## 2016-06-29 NOTE — Consult Note (Signed)
SURGICAL CONSULTATION NOTE (initial) - cpt: 99244  HISTORY OF PRESENT ILLNESS (HPI):  70 y.o. female presented with worsening abdominal pain x 3 days and mild-/moderate- nausea without emesis over the past 24 hours with abdominal "bloating" >~1 month. She was actually evaluated at [facility] ED yesterday, where her urine was tested and found to be negative for infection, after which she was offered pain medication and follow-up with her PMD vs CT. She chose to follow up outpatient with her PMD. Patient reports her last colonoscopy was >10 years ago, at which time she was told it was normal. Her last "normal" BM was 4 days ago, she has not eaten any solid food since last night, and she has had only a few sips of water today. She otherwise denies any fever/chills or chest pain, though she states she sometimes feels short of breath when walking <1 block or up a flight of stairs, and her pain is currently well-controlled. Despite her shortness of breath, she denies any prior cardiac or pulmonary diagnoses or treatments and quit smoking >20 years ago. She also reports having seen her primary care physician this past June and says her HTN and GERD are typically well-controlled.  Surgery is consulted by ED physician Dr. Juleen China in this context for evaluation and management of sigmoid colon perforation.  PAST MEDICAL HISTORY (PMH):  Past Medical History:  Diagnosis Date  . Arthritis   . Back pain   . DJD (degenerative joint disease)   . Full dentures   . GERD (gastroesophageal reflux disease)   . Hypertension   . Wears glasses      PAST SURGICAL HISTORY (PSH):  Past Surgical History:  Procedure Laterality Date  . BREAST REDUCTION SURGERY Bilateral 04/25/2013   Procedure: MAMMARY REDUCTION  (BREAST);  Surgeon: Louisa Second, MD;  Location: [facility];  Service: Plastics;  Laterality: Bilateral;  . COLONOSCOPY    . TUBAL LIGATION       MEDICATIONS:  Prior to Admission medications    Medication Sig Start Date End Date Taking? Authorizing Provider  amLODipine (NORVASC) 10 MG tablet Take 10 mg by mouth daily.   Yes Historical Provider, MD  diclofenac (VOLTAREN) 75 MG EC tablet Take 75 mg by mouth 2 (two) times daily.   Yes Historical Provider, MD  GLUCOSAMINE CHONDROITIN COMPLX PO Take 1 tablet by mouth daily.   Yes Historical Provider, MD  HYDROcodone-acetaminophen (NORCO/VICODIN) 5-325 MG tablet Take 1 tablet by mouth every 4 (four) hours as needed for moderate pain.   Yes Historical Provider, MD  Menthol 1.27 % PADS Apply 1 each topically daily as needed (for pain).   Yes Historical Provider, MD  omeprazole (PRILOSEC) 20 MG capsule Take 20 mg by mouth daily.   Yes Historical Provider, MD  tiZANidine (ZANAFLEX) 4 MG tablet Take 4 mg by mouth at bedtime. 03/26/16  Yes Historical Provider, MD     ALLERGIES:  No Known Allergies   SOCIAL HISTORY:  Social History   Social History  . Marital status: Divorced    Spouse name: N/A  . Number of children: N/A  . Years of education: N/A   Occupational History  . Not on file.   Social History Main Topics  . Smoking status: Former Smoker    Types: Cigarettes    Quit date: 04/20/1993  . Smokeless tobacco: Never Used  . Alcohol use Yes     Comment: rare  . Drug use: No  . Sexual activity: Not on file  Other Topics Concern  . Not on file   Social History Narrative  . No narrative on file    The patient currently resides (home / rehab facility / nursing home): Home  The patient normally is (ambulatory / bedbound): Ambulatory   FAMILY HISTORY:  History reviewed. No pertinent family history.   REVIEW OF SYSTEMS:  Constitutional: denies weight loss, fever, chills, or sweats  Eyes: denies any other vision changes, history of eye injury  ENT: denies sore throat, hearing problems  Respiratory: denies shortness of breath, wheezing  Cardiovascular: denies chest pain, palpitations  Gastrointestinal: abdominal pain,  N/V, and bowel function as per HPI Genitourinary: denies burning with urination or urinary frequency Musculoskeletal: denies any other joint pains or cramps  Skin: denies any other rashes or skin discolorations  Neurological: denies any other headache, dizziness, weakness  Psychiatric: denies any other depression, anxiety   All other review of systems were negative   VITAL SIGNS:  Temp:  [99.2 F (37.3 C)] 99.2 F (37.3 C) (10/29 1412) Pulse Rate:  [94-103] 95 (10/29 1800) Resp:  [18] 18 (10/29 1800) BP: (105-122)/(67-78) 105/75 (10/29 1800) SpO2:  [89 %-98 %] 97 % (10/29 1800) Weight:  [79.4 kg (175 lb)] 79.4 kg (175 lb) (10/29 1412)     Height: 5\' 6"  (167.6 cm) Weight: 79.4 kg (175 lb) BMI (Calculated): 28.3   INTAKE/OUTPUT:  This shift: No intake/output data recorded.  Last 2 shifts: @IOLAST2SHIFTS @   PHYSICAL EXAM:  Constitutional:  -- Normal overweight body habitus  -- Awake, alert, and oriented x3  Eyes:  -- Pupils equally round and reactive to light  -- No scleral icterus  Ear, nose, and throat:  -- No jugular venous distension  Pulmonary:  -- No crackles  -- Equal breath sounds bilaterally -- Breathing non-labored at rest Cardiovascular:  -- S1, S2 present  -- No pericardial rubs Gastrointestinal:  -- Abdomen soft, mild LLQ abdominal tenderness to palpation, mild distension, no guarding/rebound  -- No abdominal masses appreciated, pulsatile or otherwise  Musculoskeletal and Integumentary:  -- Wounds or skin discoloration: None appreciated except well-healed small infra-umbilical incision (likely a former port site)  -- Extremities: B/L UE and LE FROM, hands and feet warm, no edema  Neurologic:  -- Motor function: intact and symmetric -- Sensation: intact and symmetric   Labs:  CBC:  Lab Results  Component Value Date   WBC 10.0 06/29/2016   RBC 4.47 06/29/2016   BMP:  Lab Results  Component Value Date   GLUCOSE 138 (H) 06/29/2016   CO2 25 06/29/2016    BUN 13 06/29/2016   CREATININE 0.67 06/29/2016   CALCIUM 8.7 (L) 06/29/2016     Imaging studies:  CT Abdomen and Pelvis with Contrast (06/29/2016) Stomach/Bowel: The proximal sigmoid colon is distended filled with formed stool. This distended segment of the sigmoid colon is approximately 8 cm in length and an distended to 4.2 cm diameter. There is wall thickening in this region of the sigmoid colon, with wall thickness measuring up to 7 mm. There are no discrete diverticula in this region. There is some pericolonic fluid focally in the left lower quadrant on image number 69, with some stranding in the pericolonic mesenteries in this region. Distal to the dilated colon, the remainder of the sigmoid colon is decompressed. Proximal to the distended sigmoid colon, the remainder of the bowel loops are normal in caliber.  There are is small locules of gas within the sigmoid mesenteric in the left lower quadrant. Additionally,  there are multiple scattered locules of free intraperitoneal air, with a larger amount of free air in the nondependent portion of the upper abdomen, anterior to the liver.  Appendix and terminal ileum within normal limits. Normal rectum. Stomach is distended and demonstrates normal wall thickness.  Breast: In the far medial right breast, lower inner quadrant and  parasternal is a 1.6 x 2.3 x 2.0 cm circumscribed oval mass measuring  water density. No prior breast imaging for comparison.  Assessment/Plan: (ICD-10's: K63.1, possible C18.7) 70 y.o. 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   - NPO, IVF   - pain control prn   - IV antibiotics (Cefotetan or Zosyn preferred)  - all risks, benefits, and alternatives to colectomy with possible Hartmann's procedure (end  colostomy with mucus fistula, rectal stump) were discussed, including increased risk of anastomotic leak, abscess formation, and wound infection if fecal contamination, patient elects to proceed, and informed consent was accordingly obtained   - DVT prophylaxis  All of the above findings and recommendations were discussed with the patient and her family (daughter and nieces bedside), and all of patient's and her family's questions were answered to their expressed satisfaction.  Thank you for the opportunity to participate in this patient's care.   -- Scherrie GerlachJason E. Earlene Plateravis, MD, RPVI [facility] Office: [phone]

## 2016-06-29 NOTE — ED Provider Notes (Signed)
[facility]Y DEPT Provider Note   CSN: [id number] Arrival date & time: 06/29/16  1356     History   Chief Complaint Chief Complaint  Patient presents with  . Abdominal Pain    HPI Tiffany Bradley is a 70 y.o. female.  HPI   70 year old female with abdominal pain. Gradual onset on Friday. Constant progressive since then. Pain is diffuse across lower abdomen. Does not lateralize. Radiates into the back. She has a sensation that she needs to have a bowel movement but recently passed a small amount of stool over the past few days. No blood in this. Nausea and "watering of my mouth" but denies vomiting. Perhaps mild increase in urinary frequency. No dysuria. She reports that she was [facility]. She reports she had her urinalysis was told she did not have urinary tract infection. She was prescribed hydrocodone for pain. She is taking a few doses of this without significant improvement of her symptoms.[place]   Past Medical History:  Diagnosis Date  . Arthritis   . Back pain   . DJD (degenerative joint disease)   . Full dentures   . GERD (gastroesophageal reflux disease)   . Hypertension   . Wears glasses     There are no active problems to display for this patient.   Past Surgical History:  Procedure Laterality Date  . BREAST REDUCTION SURGERY Bilateral 04/25/2013   Procedure: MAMMARY REDUCTION  (BREAST);  Surgeon: Louisa SecondGerald Truesdale, MD;  Location: [facility];  Service: Plastics;  Laterality: Bilateral;  . COLONOSCOPY    . TUBAL LIGATION      OB History    Gravida Para Term Preterm AB Living   3 3 3     3    SAB TAB Ectopic Multiple Live Births                   Home Medications    Prior to Admission medications   Medication Sig Start Date End Date Taking? Authorizing Provider  amLODipine (NORVASC) 10 MG tablet Take 10 mg by mouth daily.    Historical Provider, MD  cyclobenzaprine (FLEXERIL) 10 MG tablet Take 10 mg by  mouth 3 (three) times daily as needed for muscle spasms.    Historical Provider, MD  omeprazole (PRILOSEC) 20 MG capsule Take 20 mg by mouth daily.    Historical Provider, MD    Family History History reviewed. No pertinent family history.  Social History Social History  Substance Use Topics  . Smoking status: Former Smoker    Types: Cigarettes    Quit date: 04/20/1993  . Smokeless tobacco: Never Used  . Alcohol use Yes     Comment: rare     Allergies   Review of patient's allergies indicates no known allergies.   Review of Systems Review of Systems  All systems reviewed and negative, other than as noted in HPI.   Physical Exam Updated Vital Signs BP 119/67 (BP Location: Left Arm)   Pulse 103   Temp 99.2 F (37.3 C) (Oral)   Resp 18   Ht 5\' 6"  (1.676 m)   Wt 175 lb (79.4 kg)   SpO2 97%   BMI 28.25 kg/m   Physical Exam  Constitutional: She appears well-developed and well-nourished. No distress.  HENT:  Head: Normocephalic and atraumatic.  Eyes: Conjunctivae are normal. Right eye exhibits no discharge. Left eye exhibits no discharge.  Neck: Neck supple.  Cardiovascular: Regular rhythm and normal heart sounds.  Exam reveals no gallop and no friction rub.   No murmur heard. Mild tachycardia  Pulmonary/Chest: Effort normal and breath sounds normal. No respiratory distress.  Abdominal: Soft. There is tenderness.  Perhaps some mild distention. Mild/moderate tenderness across lower abdomen. No rebound or guarding. No CVA tenderness.  Musculoskeletal: She exhibits no edema or tenderness.  Neurological: She is alert.  Skin: Skin is warm and dry.  Psychiatric: She has a normal mood and affect. Her behavior is normal. Thought content normal.  Nursing note and vitals reviewed.    ED Treatments / Results  Labs (all labs ordered are listed, but only abnormal results are displayed) Labs Reviewed  COMPREHENSIVE METABOLIC PANEL - Abnormal; Notable for the following:        Result Value   Potassium 3.1 (*)    Glucose, Bld 138 (*)    Calcium 8.7 (*)    All other components within normal limits  CBC - Abnormal; Notable for the following:    Hemoglobin 11.8 (*)    HCT 35.6 (*)    RDW 15.9 (*)    All other components within normal limits  URINALYSIS, ROUTINE W REFLEX MICROSCOPIC (NOT AT [facility]) - Abnormal; Notable for the following:    Color, Urine AMBER (*)    APPearance HAZY (*)    Protein, ur 100 (*)    All other components within normal limits  URINE MICROSCOPIC-ADD ON - Abnormal; Notable for the following:    Squamous Epithelial / LPF 6-30 (*)    Bacteria, UA FEW (*)    All other components within normal limits  LIPASE, BLOOD    EKG  EKG Interpretation None       Radiology No results found.   Ct Abdomen Pelvis W Contrast  Result Date: 06/29/2016 CLINICAL DATA:  70 year old patient with lower abdominal pain that radiates into the back for 2 days, nausea vomiting and constipation and frequent urination. EXAM: CT ABDOMEN AND PELVIS WITH CONTRAST TECHNIQUE: Multidetector CT imaging of the abdomen and pelvis was performed using the standard protocol following bolus administration of intravenous contrast. CONTRAST:  15mL ISOVUE-300 IOPAMIDOL (ISOVUE-300) INJECTION 61%, 100mL ISOVUE-300 IOPAMIDOL (ISOVUE-300) INJECTION 61% COMPARISON:  None. FINDINGS: Lower chest: There is a rounded noncalcified peripheral left lower lobe nodule measuring 8 mm on image number 4. There are no prior chest imaging studies for comparison. Bandlike opacity in the lingula is favored to be atelectasis or scarring. Coronary artery atherosclerosis noted. In the far medial right breast, lower inner quadrant and parasternal is a 1.6 x 2.3 x 2.0 cm circumscribed oval mass measuring water density. No prior breast imaging for comparison. Hepatobiliary: No focal liver abnormality is seen. No gallstones, gallbladder wall thickening, or biliary dilatation. Pancreas: Unremarkable. No  pancreatic ductal dilatation or surrounding inflammatory changes. Spleen: Normal in size without focal abnormality. Adrenals/Urinary Tract: Normal adrenal glands. 2 cm simple cyst upper pole left kidney. 2.2 cm simple cyst interpolar right kidney. Negative for hydronephrosis. No visualized renal stones. Stomach/Bowel: The proximal sigmoid colon is distended filled with formed stool. This distended segment of the sigmoid colon is approximately 8 cm in length and an distended to 4.2 cm diameter. There is wall thickening in this region of the sigmoid colon, with wall thickness measuring up to 7 mm. There are no discrete diverticula in this region. There is some pericolonic fluid focally in the left lower quadrant on image number 69, with some stranding in the pericolonic mesenteries in this region. Distal to the dilated colon, the remainder  of the sigmoid colon is decompressed. Proximal to the distended sigmoid colon, the remainder of the bowel loops are normal in caliber. There are is small locules of gas within the sigmoid mesenteric in the left lower quadrant. Additionally, there are multiple scattered locules of free intraperitoneal air, with a larger amount of free air in the nondependent portion of the upper abdomen, anterior to the liver. Appendix and terminal ileum within normal limits. Normal rectum. Stomach is distended and demonstrates normal wall thickness. Vascular/Lymphatic: Scattered atherosclerotic calcification of the abdominal aorta without aneurysm. Reproductive: Dense calcification in the normal size uterus consistent with a calcified fibroid. No adnexal mass. Other: Free intraperitoneal air as described above. Small amount of ascites in the dependent portion of the pelvis. Musculoskeletal: Grade 1 retrolisthesis of L4 on L5. Degenerative disc disease at L5-S1. No suspicious osseous lesion. IMPRESSION: 1. Free intraperitoneal air, most likely due to perforation of the sigmoid colon. There is an 8 cm  segment of the proximal sigmoid colon that is dilated and filled with formed stool. There is some fluid and locules of gas adjacent to this dilated colon, and the colonic wall is focally thickened in this region. There is a small amount of abdominal ascites. Colonic perforation may be due to a focal stricture. Colorectal carcinoma as a potential cause of stricture must be considered. A definite/discrete mass is not visualized on CT. Critical Value/emergent results were called by telephone at the time of interpretation on 06/29/2016 at 5:20 pm to Dr. Raeford Razor , who verbally acknowledged these results. 2. Approximately 2 cm mass in the parasternal lower inner quadrant of the right breast as described above. After the patient's current acute illness is resolved, recommend evaluation with diagnostic bilateral mammogram and right breast ultrasound at [facility]. This was discussed with Dr. Juleen China by telephone today. 3. Left lower lobe pulmonary nodule, 8 mm. Non-contrast chest CT at 6-12 months is recommended. If the nodule is stable at time of repeat CT, then future CT at 18-24 months (from today's scan) is considered optional for low-risk patients, but is recommended for high-risk patients. This recommendation follows the consensus statement: Guidelines for Management of Incidental Pulmonary Nodules Detected on CT Images: From the Fleischner Society 2017; Radiology 2017; 284:228-243. 4. Bilateral renal cysts. 5. Grade 1 anterolisthesis of L3 on L4. Electronically Signed   By: Britta Mccreedy M.D.   On: 06/29/2016 17:28   Dg Chest Portable 1 View  Result Date: 06/29/2016 CLINICAL DATA:  Acute onset of nausea, vomiting, constipation and increased urinary frequency. Initial encounter. EXAM: PORTABLE CHEST 1 VIEW COMPARISON:  None. FINDINGS: The lungs are well-aerated. Vascular congestion is noted. Bilateral atelectasis is seen. Increased interstitial markings could reflect minimal interstitial edema. There  is no evidence of pleural effusion or pneumothorax. The cardiomediastinal silhouette is borderline normal in size. No acute osseous abnormalities are seen. IMPRESSION: Vascular congestion noted. Bilateral atelectasis seen. Increased interstitial markings could reflect minimal interstitial edema. Electronically Signed   By: Roanna Raider M.D.   On: 06/29/2016 20:10    Procedures Procedures (including critical care time)  CRITICAL CARE Performed by: Raeford Razor Total critical care time: 35 minutes Critical care time was exclusive of separately billable procedures and treating other patients. Critical care was necessary to treat or prevent imminent or life-threatening deterioration. Critical care was time spent personally by me on the following activities: development of treatment plan with patient and/or surrogate as well as nursing, discussions with consultants, evaluation of patient's response to treatment,  examination of patient, obtaining history from patient or surrogate, ordering and performing treatments and interventions, ordering and review of laboratory studies, ordering and review of radiographic studies, pulse oximetry and re-evaluation of patient's condition.   Medications Ordered in ED Medications  potassium chloride SA (K-DUR,KLOR-CON) CR tablet 60 mEq (not administered)  HYDROmorphone (DILAUDID) injection 1 mg (not administered)  ondansetron (ZOFRAN) injection 4 mg (not administered)     Initial Impression / Assessment and Plan / ED Course  I have reviewed the triage vital signs and the nursing notes.  Pertinent labs & imaging results that were available during my care of the patient were reviewed by me and considered in my medical decision making (see chart for details).  Clinical Course    70 year old female with lower abdominal pain. Suspect that she may diverticulitis. States has been constipation as well. Urinalysis is unremarkable. Mild hypokalemia which is  likely noncontributory. Given her age and ER visit, will CT.  Final diagnoses:  Pre-op chest exam  Perforation of sigmoid colon [facility])    New Prescriptions New Prescriptions   No medications on file     Raeford Razor, MD 07/07/16 1152

## 2016-06-29 NOTE — ED Notes (Signed)
To OR via stretcher- family to OR WR

## 2016-06-29 NOTE — ED Notes (Signed)
Family at bedside. 

## 2016-06-29 NOTE — ED Triage Notes (Addendum)
Patient c/o lower abd pain that radiates into back. Patient reports nausea, vomiting, frequent urination, and constipation. Per [facility] and checked for UTI. Patient states no UTI. Denies any blood work or x-rays done. Patient states that the nausea and vomiting didn't start till last night. Denies any fevers.

## 2016-06-29 NOTE — ED Notes (Signed)
Pt placed on 2L of oxygen. Pt oxygen decreased after giving fentanyl.

## 2016-06-29 NOTE — ED Notes (Signed)
CRNA in to speak with pt- OR permit, labels and paperwork given to [facility], Scientist, clinical (histocompatibility and immunogenetics)CRNA

## 2016-06-29 NOTE — Anesthesia Procedure Notes (Signed)
Procedure Name: Intubation Date/Time: 06/29/2016 8:36 PM Performed by: Franco NonesYATES, Charmin Aguiniga S Pre-anesthesia Checklist: Patient identified, Patient being monitored, Timeout performed, Emergency Drugs available and Suction available Patient Re-evaluated:Patient Re-evaluated prior to inductionOxygen Delivery Method: Circle System Utilized Preoxygenation: Pre-oxygenation with 100% oxygen Intubation Type: IV induction, Rapid sequence and Cricoid Pressure applied Ventilation: Mask ventilation without difficulty Laryngoscope Size: Miller and 2 Grade View: Grade I Tube type: Oral Tube size: 7.0 mm Number of attempts: 1 Airway Equipment and Method: Stylet Placement Confirmation: ETT inserted through vocal cords under direct vision,  positive ETCO2 and breath sounds checked- equal and bilateral Secured at: 21 cm Tube secured with: Tape Dental Injury: Teeth and Oropharynx as per pre-operative assessment

## 2016-06-29 NOTE — ED Notes (Signed)
Pt last ate 10/28 at 2300 -  Clothing off, with daughter and warm blankets applied

## 2016-06-29 NOTE — Op Note (Addendum)
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 without colonic mass or diverticulosis (K63.1, K56.2)  PROCEDURE(S): (cpt's: 44140) 1.) Partial colectomy with primary linear stapled anastomosis, no diverting colostomy  INTRAOPERATIVE FINDINGS: Perforation of mid-sigmoid colon associated with an 8 cm segment containing dense stool surrounded by focally contained purulent fluid, no feculent contamination, and well-perfused, non-contaminated decompressed colon proximal and distal to focally stool-filled segment of mid-sigmoid colon containing perforation  INTRAVENOUS FLUIDS: 1200 mL crystalloid   ESTIMATED BLOOD LOSS: Minimal (<20 mL)  URINE OUTPUT: 200 mL   SPECIMENS: Partial sigmoid colon   IMPLANTS: None  DRAINS: None   COMPLICATIONS: None apparent   CONDITION AT END OF PROCEDURE: Hemodynamically stable and extubated   DISPOSITION OF PATIENT: PACU  INDICATIONS FOR PROCEDURE:  Patient is a 70 y.o. female who presented with worsening abdominal pain x 3 days and mild nausea without emesis over the past 24 hours along with abdominal "bloating" >~1 month. Patient reports her last colonoscopy was >10 years ago, at which time she was told it was normal, and patient's family reports she has a nearly lifelong history of chronic constipation. Patient presented with no leukocytosis, and a no time prior to or during surgery was patient hypotensive. All risks, benefits, and alternatives to above procedures were discussed with the patient, all of patient's questions were answered to her expressed  satisfaction, and informed consent was accordingly obtained and documented.  DETAILS OF PROCEDURE: Patient was brought to the operating suite and appropriately identified. General anesthesia was administered,  appropriate pre-operative antibiotics were confirmed, and endotracheal intubation was performed by anesthetist. In supine position, the operative site was prepped and draped in the usual sterile fashion, and following a brief time out, a lower vertical midline incision was made from the Left side of the umbilicus inferiorly to just above the pubis using a #10 blade scalpel and extended deep through subcutaneous tissues until fascia was visualized and exposed along the linea alba midline between the rectus abdominal muscles. Lower midline fascia was then divided in the midline. Upon dissecting through preperitoneal fat, the peritoneum was entered and opened along the length of the incision, taking care to avoid injury to underlying non-dilated bowel. A wound protector was then applied.  The abdominal cavity was explored, and no contamination at all was encountered upon entry into the peritoneal cavity. Further exploration did not reveal any visible nor palpable evidence of masses, and a focally contained purulent fluid collection was exposed and immediately suctioned upon examination and blunt separation of the sigmoid colon from its adjacent intestines and peritoneum. An ~8 cm segment of very redundant sigmoid colon containing dense stool and a readily identified focal perforation without any appreciable feculent contamination or any palpable mass. Hartmann's procedure and diverting colostomy were both strongly considered. However, the colon both proximal and distal to this ~8 cm segment containing dense stool and the identified perforation appeared to be well-perfused, non-contaminated, and decompressed without diverticulosis or other disease. The descending colon was then retracted medially using a  moist towel and, using a combination of blunt dissection and electrocautery, the colon was freed from its peritoneal attachments along the white line of Toldt proximally from the splenic flexure  and distally towards the rectosigmoid junction. During this dissection, the underlying ureters were protected. The splenic flexure did not need to be taken down in this case. Points of transection were selected distally and proximally, and both the distal and proximal bowel were stapled and divided using a GIA-55 linear cutting stapler with reload. Peritoneum was then scored along the mesentary with electrocautery, and the vessels were cauterized, sealed, and cut using a Ligasure. Colon specimen was then handed off of the field as specimen for pathology. Two stapled ends of bowel were confirmed to lay adjacent and parallel to one another without tension, and the corners were each cut and dilated, through which linear cutting stapler was advanced and used to create the colonic anastomosis and to close the resulting colonic defect.  The staple line was inspected and found to be intact, widely patient, and hemostatic. Interrupted 3-0 silk Lembert sutures were then used to approximate tissue over the new anastomosis. The abdominal cavity was then copiously irrigated, and hemostasis was once more confirmed. The abdominal wall was then re-approximated in layers with #1 looped PDS sutures from the top and bottom of the incision. Exparel (72-hour release liposomal formulation of bupivicane) was injected into the fascia and subcutaneously, the wound was further irrigated, and surgical skin staples were used to re-approximate skin. Skin was then cleaned and dried, and a sterile dressing was applied. Patient was then safely able to be extubated, awakened, and transferred to PACU for post-operative monitoring and care.  I was present for all aspects of the above procedure, and there were no complications apparent.

## 2016-06-29 NOTE — ED Notes (Signed)
Dr Davis at bedside.

## 2016-06-29 NOTE — Anesthesia Postprocedure Evaluation (Signed)
Anesthesia Post Note  [name]  Procedure(s) Performed: Procedure(s) (LRB): PARTIAL COLECTOMY (N/A)  Anesthesia Type: General Level of consciousness: awake Pain management: satisfactory to patient Vital Signs Assessment: post-procedure vital signs reviewed and stable Respiratory status: spontaneous breathing and patient connected to nasal cannula oxygen Cardiovascular status: stable Anesthetic complications: no    Last Vitals:  Vitals:   06/29/16 2315 06/29/16 2330  BP: 113/68 106/73  Pulse: 89   Resp: 15 16  Temp: 36.9 C     Last Pain:  Vitals:   06/29/16 2315  TempSrc:   PainSc: Asleep                 Vaden Becherer

## 2016-06-29 NOTE — H&P (Signed)
SURGICAL HISTORY AND PHYSICAL  HISTORY OF PRESENT ILLNESS (HPI):  70 y.o. female presented with worsening abdominal pain x 3 days and mild-/moderate- nausea without emesis over the past 24 hours with abdominal "bloating" >~1 month. She was actually evaluated at [facility] yesterday, where her urine was tested and found to be negative for infection, after which she was offered pain medication and follow-up with her PMD vs CT. She chose to follow up outpatient with her PMD. Patient reports her last colonoscopy was >10 years ago, at which time she was told it was normal. Her last "normal" BM was 4 days ago, she has not eaten any solid food since last night, and she has had only a few sips of water today. She otherwise denies any fever/chills or chest pain, though she states she sometimes feels short of breath when walking <1 block or up a flight of stairs, and her pain is currently well-controlled. Despite her shortness of breath, she denies any prior cardiac or pulmonary diagnoses or treatments and quit smoking >20 years ago. She also reports having seen her primary care physician this past June and says her HTN and GERD are typically well-controlled.  Surgery is consulted by ED physician Dr. Juleen ChinaKohut in this context for evaluation and management of sigmoid colon perforation.  PAST MEDICAL HISTORY (PMH):      Past Medical History:  Diagnosis Date  . Arthritis   . Back pain   . DJD (degenerative joint disease)   . Full dentures   . GERD (gastroesophageal reflux disease)   . Hypertension   . Wears glasses      PAST SURGICAL HISTORY (PSH):       Past Surgical History:  Procedure Laterality Date  . BREAST REDUCTION SURGERY Bilateral 04/25/2013   Procedure: MAMMARY REDUCTION  (BREAST);  Surgeon: Louisa SecondGerald Truesdale, MD;  Location: [facility];  Service: Plastics;  Laterality: Bilateral;  . COLONOSCOPY    . TUBAL LIGATION       MEDICATIONS:         Prior to  Admission medications   Medication Sig Start Date End Date Taking? Authorizing Provider  amLODipine (NORVASC) 10 MG tablet Take 10 mg by mouth daily.   Yes Historical Provider, MD  diclofenac (VOLTAREN) 75 MG EC tablet Take 75 mg by mouth 2 (two) times daily.   Yes Historical Provider, MD  GLUCOSAMINE CHONDROITIN COMPLX PO Take 1 tablet by mouth daily.   Yes Historical Provider, MD  HYDROcodone-acetaminophen (NORCO/VICODIN) 5-325 MG tablet Take 1 tablet by mouth every 4 (four) hours as needed for moderate pain.   Yes Historical Provider, MD  Menthol 1.27 % PADS Apply 1 each topically daily as needed (for pain).   Yes Historical Provider, MD  omeprazole (PRILOSEC) 20 MG capsule Take 20 mg by mouth daily.   Yes Historical Provider, MD  tiZANidine (ZANAFLEX) 4 MG tablet Take 4 mg by mouth at bedtime. 03/26/16  Yes Historical Provider, MD     ALLERGIES:  No Known Allergies   SOCIAL HISTORY:  Social History        Social History  . Marital status: Divorced    Spouse name: N/A  . Number of children: N/A  . Years of education: N/A   Occupational History  . Not on file.         Social History Main Topics  . Smoking status: Former Smoker    Types: Cigarettes    Quit date: 04/20/1993  . Smokeless tobacco: Never Used  .  Alcohol use Yes     Comment: rare  . Drug use: No  . Sexual activity: Not on file       Other Topics Concern  . Not on file      Social History Narrative  . No narrative on file    The patient currently resides (home / rehab facility / nursing home): Home  The patient normally is (ambulatory / bedbound): Ambulatory   FAMILY HISTORY:  History reviewed. No pertinent family history.   REVIEW OF SYSTEMS:  Constitutional: denies weight loss, fever, chills, or sweats  Eyes: denies any other vision changes, history of eye injury  ENT: denies sore throat, hearing problems  Respiratory: denies shortness of breath, wheezing   Cardiovascular: denies chest pain, palpitations  Gastrointestinal: abdominal pain, N/V, and bowel function as per HPI Genitourinary: denies burning with urination or urinary frequency Musculoskeletal: denies any other joint pains or cramps  Skin: denies any other rashes or skin discolorations  Neurological: denies any other headache, dizziness, weakness  Psychiatric: denies any other depression, anxiety   All other review of systems were negative   VITAL SIGNS:  Temp:  [99.2 F (37.3 C)] 99.2 F (37.3 C) (10/29 1412) Pulse Rate:  [94-103] 95 (10/29 1800) Resp:  [18] 18 (10/29 1800) BP: (105-122)/(67-78) 105/75 (10/29 1800) SpO2:  [89 %-98 %] 97 % (10/29 1800) Weight:  [79.4 kg (175 lb)] 79.4 kg (175 lb) (10/29 1412)     Height: 5\' 6"  (167.6 cm) Weight: 79.4 kg (175 lb) BMI (Calculated): 28.3   INTAKE/OUTPUT:  This shift: No intake/output data recorded.  Last 2 shifts: @IOLAST2SHIFTS @   PHYSICAL EXAM:  Constitutional:  -- Normal overweight body habitus  -- Awake, alert, and oriented x3  Eyes:  -- Pupils equally round and reactive to light  -- No scleral icterus  Ear, nose, and throat:  -- No jugular venous distension  Pulmonary:  -- No crackles  -- Equal breath sounds bilaterally -- Breathing non-labored at rest Cardiovascular:  -- S1, S2 present  -- No pericardial rubs Gastrointestinal:  -- Abdomen soft, mild LLQ abdominal tenderness to palpation, mild distension, no guarding/rebound  -- No abdominal masses appreciated, pulsatile or otherwise  Musculoskeletal and Integumentary:  -- Wounds or skin discoloration: None appreciated except well-healed small infra-umbilical incision (likely a former port site)  -- Extremities: B/L UE and LE FROM, hands and feet warm, no edema  Neurologic:  -- Motor function: intact and symmetric -- Sensation: intact and symmetric   Labs:  CBC:  Labs (Brief)       Lab Results  Component Value Date   WBC 10.0 06/29/2016    RBC 4.47 06/29/2016     BMP:  Labs (Brief)  Lab Results  Component Value Date   GLUCOSE 138 (H) 06/29/2016   CO2 25 06/29/2016   BUN 13 06/29/2016   CREATININE 0.67 06/29/2016   CALCIUM 8.7 (L) 06/29/2016       Imaging studies:  CT Abdomen and Pelvis with Contrast (06/29/2016) Stomach/Bowel: The proximal sigmoid colon is distended filled with formed stool. This distended segment of the sigmoid colon is approximately 8 cm in length and an distended to 4.2 cm diameter. There is wall thickening in this region of the sigmoid colon, with wall thickness measuring up to 7 mm. There are no discrete diverticula in this region. There is some pericolonic fluid focally in the left lower quadrant on image number 69, with some stranding in the pericolonic mesenteries in this region. Distal to  the dilated colon, the remainder of the sigmoid colon is decompressed. Proximal to the distended sigmoid colon, the remainder of the bowel loops are normal in caliber.  There are is small locules of gas within the sigmoid mesenteric in the left lower quadrant. Additionally, there are multiple scattered locules of free intraperitoneal air, with a larger amount of free air in the nondependent portion of the upper abdomen, anterior to the liver.  Appendix and terminal ileum within normal limits. Normal rectum. Stomach is distended and demonstrates normal wall thickness.  Breast: In the far medial right breast, lower inner quadrant and  parasternal is a 1.6 x 2.3 x 2.0 cm circumscribed oval mass measuring  water density. No prior breast imaging for comparison.  Assessment/Plan: (ICD-10's: K63.1, possible C18.7) 70 y.o. 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              - NPO, IVF             -  pain control prn              - IV antibiotics (Cefotetan or Zosyn preferred)             - all risks, benefits, and alternatives to colectomy with possible Hartmann's procedure (end colostomy with mucus fistula, rectal stump) were discussed, including increased risk of anastomotic leak, abscess formation, and wound infection if fecal contamination, patient elects to proceed, and informed consent was accordingly obtained    - plan is for emergent partial colectomy, possible Hartmann's procedure tonight             - DVT prophylaxis  All of the above findings and recommendations were discussed with the patient and her family (daughter and nieces bedside), and all of patient's and her family's questions were answered to their expressed satisfaction.  Thank you for the opportunity to participate in this patient's care.   -- Scherrie GerlachJason E. Earlene Plateravis, MD, RPVI [place]: [facility] Office: [phone]

## 2016-06-29 NOTE — Transfer of Care (Signed)
Immediate Anesthesia Transfer of Care Note  [name]  Procedure(s) Performed: Procedure(s): PARTIAL COLECTOMY (N/A)  Patient Location: PACU  Anesthesia Type:General  Level of Consciousness: sedated and patient cooperative  Airway & Oxygen Therapy: Patient Spontanous Breathing and non-rebreather face mask  Post-op Assessment: Report given to RN and Post -op Vital signs reviewed and stable  Post vital signs: Reviewed and stable  Last Vitals:  Vitals:   06/29/16 2000 06/29/16 2023  BP: 110/72 110/72  Pulse: 92 92  Resp:  18  Temp:  36.9 C    Last Pain:  Vitals:   06/29/16 2023  TempSrc: Oral  PainSc:          Complications: No apparent anesthesia complications

## 2016-06-30 ENCOUNTER — Encounter (HOSPITAL_COMMUNITY): Payer: Self-pay

## 2016-06-30 MED ORDER — MORPHINE SULFATE (PF) 2 MG/ML IV SOLN
2.0000 mg | INTRAVENOUS | Status: DC | PRN
  Administered 2016-07-01: 2 mg via INTRAVENOUS
  Filled 2016-06-30: qty 1

## 2016-06-30 MED ORDER — ONDANSETRON HCL 4 MG PO TABS: 4.0000 mg | ORAL_TABLET | Freq: Four times a day (QID) | ORAL | Status: DC | PRN

## 2016-06-30 MED ORDER — ONDANSETRON HCL 4 MG/2ML IJ SOLN: 4.0000 mg | Freq: Four times a day (QID) | INTRAMUSCULAR | Status: DC | PRN

## 2016-06-30 MED ORDER — PIPERACILLIN-TAZOBACTAM 3.375 G IVPB
3.3750 g | Freq: Three times a day (TID) | INTRAVENOUS | Status: DC
  Administered 2016-06-30 – 2016-07-04 (×14): 3.375 g via INTRAVENOUS
  Filled 2016-06-30 (×14): qty 50

## 2016-06-30 MED ORDER — ENOXAPARIN SODIUM 40 MG/0.4ML ~~LOC~~ SOLN
40.0000 mg | SUBCUTANEOUS | Status: DC
Start: 2016-07-01 — End: 2016-07-04
  Administered 2016-07-01 – 2016-07-03 (×4): 40 mg via SUBCUTANEOUS
  Filled 2016-06-30 (×4): qty 0.4

## 2016-06-30 MED ORDER — KETOROLAC TROMETHAMINE 15 MG/ML IJ SOLN
15.0000 mg | Freq: Four times a day (QID) | INTRAMUSCULAR | Status: AC
  Administered 2016-06-30: 15 mg via INTRAVENOUS
  Filled 2016-06-30: qty 1

## 2016-06-30 MED ORDER — ORAL CARE MOUTH RINSE
15.0000 mL | Freq: Two times a day (BID) | OROMUCOSAL | Status: DC
  Administered 2016-06-30 – 2016-07-03 (×8): 15 mL via OROMUCOSAL

## 2016-06-30 MED ORDER — KETOROLAC TROMETHAMINE 15 MG/ML IJ SOLN
15.0000 mg | Freq: Four times a day (QID) | INTRAMUSCULAR | Status: AC | PRN
  Administered 2016-06-30 – 2016-07-01 (×3): 15 mg via INTRAVENOUS
  Filled 2016-06-30 (×3): qty 1

## 2016-06-30 MED ORDER — CHLORHEXIDINE GLUCONATE 0.12 % MT SOLN
15.0000 mL | Freq: Two times a day (BID) | OROMUCOSAL | Status: DC
  Administered 2016-06-30 – 2016-07-04 (×9): 15 mL via OROMUCOSAL
  Filled 2016-06-30 (×9): qty 15

## 2016-06-30 MED ORDER — KCL IN DEXTROSE-NACL 20-5-0.45 MEQ/L-%-% IV SOLN
INTRAVENOUS | Status: DC
  Administered 2016-06-30 – 2016-07-01 (×3): via INTRAVENOUS

## 2016-06-30 NOTE — Anesthesia Postprocedure Evaluation (Signed)
Anesthesia Post Note  [name]  Procedure(s) Performed: Procedure(s) (LRB): PARTIAL COLECTOMY (N/A)  Patient location during evaluation: Nursing Unit Anesthesia Type: General Level of consciousness: awake Pain management: satisfactory to patient Vital Signs Assessment: post-procedure vital signs reviewed and stable Respiratory status: spontaneous breathing and patient connected to nasal cannula oxygen Cardiovascular status: stable Anesthetic complications: no    Last Vitals:  Vitals:   06/30/16 0629 06/30/16 0800  BP: (!) 99/56 110/80  Pulse: 89 79  Resp: 18 17  Temp: (!) 34.9 C 37.6 C    Last Pain:  Vitals:   06/30/16 0805  TempSrc:   PainSc: 5                  Shaine Mount

## 2016-06-30 NOTE — Progress Notes (Signed)
Incentive in room 

## 2016-06-30 NOTE — Addendum Note (Signed)
Addendum  created 06/30/16 0930 by Franco Noneseresa S Rayjon Wery, CRNA   Sign clinical note

## 2016-06-30 NOTE — Progress Notes (Signed)
SURGICAL PROGRESS NOTE  Hospital Day(s): 1.   Post op day(s): 1 Day Post-Op.   Interval History: Patient seen and examined, no acute events overnight. Patient reports her peri-incisional pain is overall controlled, though is sore with coughing that is somewhat mediated by holding a pillow over her abdomen when she coughs. She otherwise denies N/V, fever/chills, CP, or SOB.  Review of Systems:  Constitutional: denies fever, chills  HEENT: denies cough or congestion  Respiratory: denies any shortness of breath  Cardiovascular: denies chest pain or palpitations  Gastrointestinal: denies N/V, diarrhea or constipation  Musculoskeletal: denies pain, decreased motor or sensation  Neurological: denies HA or vision/hearing changes   Vital signs in last 24 hours: [min-max] current  Temp:  [94.8 F (34.9 C)-100.2 F (37.9 C)] 99.7 F (37.6 C) (10/30 0800) Pulse Rate:  [79-103] 79 (10/30 0800) Resp:  [15-18] 17 (10/30 0800) BP: (95-122)/(56-80) 110/80 (10/30 0800) SpO2:  [89 %-100 %] 100 % (10/30 0800) Weight:  [79.4 kg (175 lb)-83.7 kg (184 lb 8.4 oz)] 83.7 kg (184 lb 8.4 oz) (10/30 0023)     Height: 5\' 8"  (172.7 cm) Weight: 83.7 kg (184 lb 8.4 oz) BMI (Calculated): 28.1   Intake/Output this shift:  Total I/O In: 50 [IV Piggyback:50] Out: -    Intake/Output last 2 shifts:  @IOLAST2SHIFTS @   Physical Exam:  Constitutional: alert, cooperative and no distress  HENT: normocephalic without obvious abnormality  Eyes: PERRL, EOM's grossly intact and symmetric  Neuro: CN II - XII grossly intact and symmetric without deficit  Respiratory: breathing non-labored at rest  Cardiovascular: regular rate and sinus rhythm  Gastrointestinal: soft, appropriate/moderate peri-incisional tenderness to palpation, non-distended, incision is well-approximated without erythema or drainage, and dressing is c/d/i  Musculoskeletal: UE and LE FROM, motor and sensation grossly intact, NT    Labs:  CBC:   Lab Results  Component Value Date   WBC 10.0 06/29/2016   RBC 4.47 06/29/2016   BMP:  Lab Results  Component Value Date   GLUCOSE 138 (H) 06/29/2016   CO2 25 06/29/2016   BUN 13 06/29/2016   CREATININE 0.67 06/29/2016   CALCIUM 8.7 (L) 06/29/2016     Imaging studies: No new pertinent imaging studies   Assessment/Plan: (ICD-10's: K63.1, K56.2) 70 y.o. 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   - pain control prn  - incentive spirometry  - NPO and IV fluids, daily CBC and BMP   - monitor abdominal exam and bowel function  - NG tube to low intermittent suction for now  - will plan to remove NG tube and start clear liquids when flatus  - ambulation encouraged, DVT prophylaxis (ordered)  - follow-up surgical pathology  All of the above findings and recommendations were discussed with the patient, and all of patient's questions were answered to her expressed satisfaction.  -- Scherrie GerlachJason E. Earlene Plateravis, MD, RPVI [place]: [facility] Office: [phone]

## 2016-06-30 NOTE — Care Management Note (Signed)
Case Management Note  Patient Details  Name: Tiffany Bradley MRN: [id number] Date of Birth: 05/10/1946  Subjective/Objective:                  Pt admitted s/p repair of perforated sigmoid colon. Pt lives with her daughter and grandchildren. She is ind with ADL's. She has PCP and drives herself to appointments. She does not wear oxygen at home PTA. She plans to return home with self care.   Action/Plan: No CM needs anticipated. Will cont to follow.   Expected Discharge Date:      06/30/2016            Expected Discharge Plan:  Home/Self Care  In-House Referral:  NA  Discharge planning Services  CM Consult  Post Acute Care Choice:  NA Choice offered to:  NA  Status of Service:  In process, will continue to follow  [name], [name], RN 06/30/2016, 1:58 PM

## 2016-06-30 NOTE — Care Management Important Message (Signed)
Important Message  Patient Details  Name: Tiffany Bradley MRN: [id number] Date of Birth: 06/10/1946   Medicare Important Message Given:  Yes    Tiffany Bradley, Tiffany Griffiths Demske, RN 06/30/2016, 1:54 PM

## 2016-07-01 LAB — BASIC METABOLIC PANEL
Anion gap: 6 (ref 5–15)
BUN: 14 mg/dL (ref 6–20)
CALCIUM: 7.8 mg/dL — AB (ref 8.9–10.3)
CO2: 26 mmol/L (ref 22–32)
CREATININE: 0.57 mg/dL (ref 0.44–1.00)
Chloride: 102 mmol/L (ref 101–111)
GFR calc Af Amer: >60 mL/min (ref >60)
GFR calc non Af Amer: >60 mL/min (ref >60)
GLUCOSE: 144 mg/dL — AB (ref 65–99)
Potassium: 3.3 mmol/L — ABNORMAL LOW (ref 3.5–5.1)
Sodium: 134 mmol/L — ABNORMAL LOW (ref 135–145)

## 2016-07-01 LAB — CBC
HEMATOCRIT: 30.8 % — AB (ref 36.0–46.0)
Hemoglobin: 10.3 g/dL — ABNORMAL LOW (ref 12.0–15.0)
MCH: 26.6 pg (ref 26.0–34.0)
MCHC: 33.4 g/dL (ref 30.0–36.0)
MCV: 79.6 fL (ref 78.0–100.0)
Platelets: 265 10*3/uL (ref 150–400)
RBC: 3.87 MIL/uL (ref 3.87–5.11)
RDW: 16.1 % — AB (ref 11.5–15.5)
WBC: 9.3 10*3/uL (ref 4.0–10.5)

## 2016-07-01 MED ORDER — ACETAMINOPHEN 325 MG PO TABS: 650.0000 mg | ORAL_TABLET | Freq: Four times a day (QID) | ORAL | Status: DC | PRN

## 2016-07-01 MED ORDER — OXYCODONE-ACETAMINOPHEN 5-325 MG PO TABS
1.0000 | ORAL_TABLET | ORAL | Status: DC | PRN
  Administered 2016-07-01 – 2016-07-04 (×4): 1 via ORAL
  Filled 2016-07-01 (×4): qty 1

## 2016-07-01 NOTE — Progress Notes (Signed)
Foley catheter removed with no complication. Informed pt to notify RN when she feels the urge to urinate. Will continue to monitor pt.

## 2016-07-01 NOTE — Progress Notes (Addendum)
SURGICAL PROGRESS NOTE  Hospital Day(s): 2.   Post op day(s): 2 Days Post-Op.  Interval History: Patient seen and examined, no acute events or new complaints overnight, minimal NGT drainage (<50 mL since yesterday). Patient reports +flatus x3 and +BM with much decreased pain well-controlled, denies N/V, CP, or SOB.  Review of Systems:  Constitutional: denies fever, chills  HEENT: denies cough or congestion  Respiratory: denies any shortness of breath  Cardiovascular: denies chest pain or palpitations  Gastrointestinal: denies N/V, diarrhea or constipation  Musculoskeletal: denies pain, decreased motor or sensation  Neurological: denies HA or vision/hearing changes   Vital signs in last 24 hours: [min-max] current  Temp:  [98.8 F (37.1 C)-100.2 F (37.9 C)] 98.8 F (37.1 C) (10/31 0500) Pulse Rate:  [90-99] 90 (10/31 0500) Resp:  [16-20] 18 (10/31 0500) BP: (101-110)/(61-72) 110/72 (10/31 0500) SpO2:  [91 %-100 %] 91 % (10/31 0500)     Height: 5\' 8"  (172.7 cm) Weight: 83.7 kg (184 lb 8.4 oz) BMI (Calculated): 28.1   Intake/Output this shift:  Total I/O In: 0  Out: 300 [Urine:300]   Intake/Output last 2 shifts:  @IOLAST2SHIFTS @   Physical Exam:  Constitutional: alert, cooperative and no distress  HENT: normocephalic without obvious abnormality  Eyes: PERRL, EOM's grossly intact and symmetric  Neuro: CN II - XII grossly intact and symmetric without deficit  Respiratory: breathing non-labored at rest  Cardiovascular: regular rate and sinus rhythm  Gastrointestinal: soft, minimal peri-incisional tenderness to palpation, and non-distended, incision well-approximated without erythema or drainage, dressing c/d/i Musculoskeletal: UE and LE FROM, motor and sensation grossly intact, NT    Labs:  CBC:  Lab Results  Component Value Date   WBC 9.3 07/01/2016   RBC 3.87 07/01/2016   BMP:  Lab Results  Component Value Date   GLUCOSE 144 (H) 07/01/2016   CO2 26 07/01/2016    BUN 14 07/01/2016   CREATININE 0.57 07/01/2016   CALCIUM 7.8 (L) 07/01/2016     Imaging studies: No new pertinent imaging studies   Assessment/Plan: (ICD-10's: K63.1, K56.2) 70 y.o. female doing overall well with resolving post-op ileus 2 Days Post-Op s/p exploratory laparotomy and partial sigmoid colon resection with linear stapled primary anastomosis for perforated sigmoid colon attributed to chronic redundant sigmoid colon with possible partial vs complete sigmoid volvuluswithout appreciable colonic mass or diverticulitis, complicated by pertinent comorbidities including HTN, GERD, former tobacco abuse, and possibly undiagnosed COPD.              - pain control prn, minimize narcotics             - monitor abdominal exam and bowel function             - NG tube removed, sips of clear liquids ordered, will decrease IV fluids  - antibiotics continued for intra-abdominal infection, will complete course  - ambulation + incentive spirometry encouraged  - normotensive, will hold home amlodipine             - follow-up surgical pathology  - DVT prophylaxis  All of the above findings and recommendations were discussed with the patient, and all of patient's questions were answered to her expressed satisfaction.  -- Scherrie GerlachJason E. Earlene Plateravis, MD, RPVI [place]: [facility] Office: [phone]

## 2016-07-01 NOTE — Progress Notes (Signed)
Pt has passed gas.

## 2016-07-02 ENCOUNTER — Encounter (HOSPITAL_COMMUNITY): Payer: Self-pay | Admitting: Surgery

## 2016-07-02 LAB — CBC
HCT: 28.1 % — ABNORMAL LOW (ref 36.0–46.0)
Hemoglobin: 9.4 g/dL — ABNORMAL LOW (ref 12.0–15.0)
MCH: 26.5 pg (ref 26.0–34.0)
MCHC: 33.5 g/dL (ref 30.0–36.0)
MCV: 79.2 fL (ref 78.0–100.0)
PLATELETS: 273 10*3/uL (ref 150–400)
RBC: 3.55 MIL/uL — ABNORMAL LOW (ref 3.87–5.11)
RDW: 16 % — ABNORMAL HIGH (ref 11.5–15.5)
WBC: 7.6 10*3/uL (ref 4.0–10.5)

## 2016-07-02 LAB — BASIC METABOLIC PANEL
Anion gap: 6 (ref 5–15)
BUN: 9 mg/dL (ref 6–20)
CHLORIDE: 105 mmol/L (ref 101–111)
CO2: 27 mmol/L (ref 22–32)
CREATININE: 0.44 mg/dL (ref 0.44–1.00)
Calcium: 8.1 mg/dL — ABNORMAL LOW (ref 8.9–10.3)
GFR calc Af Amer: >60 mL/min (ref >60)
GFR calc non Af Amer: >60 mL/min (ref >60)
GLUCOSE: 120 mg/dL — AB (ref 65–99)
Potassium: 3.4 mmol/L — ABNORMAL LOW (ref 3.5–5.1)
SODIUM: 138 mmol/L (ref 135–145)

## 2016-07-02 MED ORDER — PANTOPRAZOLE SODIUM 40 MG PO TBEC
40.0000 mg | DELAYED_RELEASE_TABLET | Freq: Every day | ORAL | Status: DC
  Administered 2016-07-02 – 2016-07-04 (×3): 40 mg via ORAL
  Filled 2016-07-02 (×3): qty 1

## 2016-07-02 NOTE — Progress Notes (Addendum)
SURGICAL PROGRESS NOTE  Hospital Day(s): 3.   Post op day(s): 3 Days Post-Op.   Interval History: Patient seen and examined, no acute events or new complaints overnight. Patient reports she continues to pass a small amount of flatus and feels the urge to have a bowel movement with mild pain well-controlled, though she expresses frustration that she has not had one yet, denies N/V, fever/chills, CP, or SOB.  Review of Systems:  Constitutional: denies fever, chills  HEENT: denies cough or congestion  Respiratory: denies any shortness of breath  Cardiovascular: denies chest pain or palpitations  Gastrointestinal: denies N/V, diarrhea or constipation  Musculoskeletal: denies pain, decreased motor or sensation  Neurological: denies HA or vision/hearing changes   Vital signs in last 24 hours: [min-max] current  Temp:  [98 F (36.7 C)-99.5 F (37.5 C)] 98 F (36.7 C) (11/01 0300) Pulse Rate:  [78-97] 78 (11/01 0300) Resp:  [20] 20 (11/01 0300) BP: (107-122)/(65-78) 114/78 (11/01 0300) SpO2:  [87 %-100 %] 100 % (11/01 0300)     Height: 5\' 8"  (172.7 cm) Weight: 83.7 kg (184 lb 8.4 oz) BMI (Calculated): 28.1   Intake/Output this shift:  No intake/output data recorded.   Intake/Output last 2 shifts:  @IOLAST2SHIFTS @   Physical Exam:  Constitutional: alert, cooperative and no distress  HENT: normocephalic without obvious abnormality  Eyes: PERRL, EOM's grossly intact and symmetric  Neuro: CN II - XII grossly intact and symmetric without deficit  Respiratory: breathing non-labored at rest  Cardiovascular: regular rate and sinus rhythm  Gastrointestinal: soft, non-tender, and non-distended, incision well-approximated without erythema or drainage, dressing c/d/i Musculoskeletal: UE and LE FROM, motor and sensation grossly intact, NT    Labs:  CBC:  Lab Results  Component Value Date   WBC 7.6 07/02/2016   RBC 3.55 (L) 07/02/2016   BMP:  Lab Results  Component Value Date   GLUCOSE 120 (H) 07/02/2016   CO2 27 07/02/2016   BUN 9 07/02/2016   CREATININE 0.44 07/02/2016   CALCIUM 8.1 (L) 07/02/2016     Imaging studies: No new pertinent imaging studies   Assessment/Plan:(ICD-10's: K63.1, K56.2) 70 y.o.femaledoing overall well with resolving post-op ileus 3 Days Post-Ops/p exploratory laparotomy and partial sigmoid colon resection with linear stapled primary anastomosisfor perforated sigmoid colon attributed to chronic redundant sigmoid colon with possible partial vs complete sigmoid volvuluswithout appreciable colonic mass or diverticulitis, complicated by pertinent comorbidities including HTN, GERD, former tobacco abuse, and possibly undiagnosed COPD. Pathology negative for malignancy.   - ambulation encouraged - pain control prn, minimize narcotics  - will continue clear liquids diet for now - monitor abdominal exam and bowel function             - antibiotics continued for intra-abdominal infection, will complete 5-day course             - normotensive, will continue to hold home amlodipine  - pathology results discussed with patient             - DVT prophylaxis  All of the above findings and recommendations were discussed with the patient, and all of patient's questions were answered to her expressed satisfaction.  -- Scherrie GerlachJason E. Earlene Plateravis, MD, RPVI [place]: [facility] Office: [phone]

## 2016-07-03 LAB — BASIC METABOLIC PANEL
Anion gap: 8 (ref 5–15)
BUN: 6 mg/dL (ref 6–20)
CALCIUM: 8.5 mg/dL — AB (ref 8.9–10.3)
CHLORIDE: 103 mmol/L (ref 101–111)
CO2: 27 mmol/L (ref 22–32)
CREATININE: 0.48 mg/dL (ref 0.44–1.00)
GFR calc Af Amer: >60 mL/min (ref >60)
GFR calc non Af Amer: >60 mL/min (ref >60)
Glucose, Bld: 111 mg/dL — ABNORMAL HIGH (ref 65–99)
Potassium: 3.2 mmol/L — ABNORMAL LOW (ref 3.5–5.1)
SODIUM: 138 mmol/L (ref 135–145)

## 2016-07-03 LAB — CBC
HEMATOCRIT: 29.9 % — AB (ref 36.0–46.0)
HEMOGLOBIN: 10 g/dL — AB (ref 12.0–15.0)
MCH: 26.2 pg (ref 26.0–34.0)
MCHC: 33.4 g/dL (ref 30.0–36.0)
MCV: 78.3 fL (ref 78.0–100.0)
Platelets: 336 10*3/uL (ref 150–400)
RBC: 3.82 MIL/uL — ABNORMAL LOW (ref 3.87–5.11)
RDW: 16.1 % — AB (ref 11.5–15.5)
WBC: 7.6 10*3/uL (ref 4.0–10.5)

## 2016-07-03 NOTE — Progress Notes (Signed)
Patient ambulated in hallway approximately 100 feet with supervision. Patient tolerated ambulation well.  Will continue to monitor patient.

## 2016-07-03 NOTE — Progress Notes (Signed)
Patient ambulated in hallway with family approximately 100 feet with no distress and minimal assistance. Will continue to monitor patient.

## 2016-07-04 MED ORDER — OXYCODONE-ACETAMINOPHEN 5-325 MG PO TABS: 1.0000 | ORAL_TABLET | ORAL | 0 refills | Status: DC | PRN

## 2016-07-04 NOTE — Care Management Important Message (Signed)
Important Message  Patient Details  Name: Tiffany Bradley MRN: [id number] Date of Birth: 02/27/1946   Medicare Important Message Given:  Yes    [name], Shawnique Mariotti Demske, RN 07/04/2016, 10:55 AM

## 2016-07-04 NOTE — Care Management Note (Signed)
Case Management Note  Patient Details  Name: Tiffany Bradley MRN: [id number] Date of Birth: 10/22/1945  Expected Discharge Date:    07/04/2016               Expected Discharge Plan:  Home/Self Care  In-House Referral:  NA  Discharge planning Services  CM Consult  Post Acute Care Choice:  NA Choice offered to:  NA  Status of Service:  Completed, signed off   Additional Comments: Pt anticipated DC in next 24-48 Hrs. Pt plans to return home with self care. Pt has support at home. No CM needs.   Malcolm Metrohildress, Symphony Demuro Demske, RN 07/04/2016, 10:55 AM

## 2016-07-04 NOTE — Progress Notes (Signed)
Patient discharged home.  IV removed - WNL.  Reviewed DC instructions and medications, educated on incisional care and s/s of infection and when to call MD  - verbalizes understanding.  No questions at this time.  Follow up in place.  Assisted off unit via WC in NAD.

## 2016-07-05 NOTE — Discharge Summary (Signed)
Physician Discharge Summary  Patient ID: Tiffany Bradley MRN: [id number] DOB/AGE: 69/05/1946 69 y.o.  Admit date: 06/29/2016 Discharge date: 07/05/2016  Admission Diagnoses:  Discharge Diagnoses:  Active Problems:   Perforation of sigmoid colon [facility](HCC)   Discharged Condition: good  Hospital Course: 70 year old Female presented with Left-sided > generalized abdominal pain, recent cessation of flatus and BM's, and findings of free intraperitoneal air on CT without diverticulitis or visible mass. Patient was brought to the OR emergently, and aside from a contained purulent fluid collection encompassing the identified perforation within a segment of bowel containing a large amount of impacted dense but soft stool with no identifiable mass, let alone an obstructing mass, and completely normal decompressed Left and sigmoid colon proximal and distal to the diseased segment, suggestive of perforated sigmoid volvulus. Accordingly, uncomplicated non-oncologic partial colectomy with linear stapled primary anastomosis was performed, and therapeutic (not prophylactic) antibiotics were continued for a 5-day course. Patient's post-operative course was uneventful as her pain was well-controlled while she recovered bowel function, NG tube was removed, her diet was appropriately advanced, and discharge planning was initiated. Surgical pathology confirmed no evidence of colon malignancy, the results of which were promptly discussed with the patient. On post-operative day 4, patient was safely able to be discharged home with follow-up.  Consults: general surgery  Significant Diagnostic Studies: radiology: CT scan: Colonic perforation with free intraperitoneal air  Treatments: surgery: Partial colectomy with primary linear stapled colonic anastomosis  Discharge Exam: Blood pressure 135/87, pulse 94, temperature 98.7 F (37.1 C), temperature source Oral, resp. rate 20, height 5\' 8"  (1.727 m), weight 83.7 kg (184 lb 8.4  oz), SpO2 98 %. General appearance: alert, cooperative and no distress GI: soft, non-tender; bowel sounds normal; no masses,  no organomegaly, incision well-approximated without erythema or drainage  Disposition: 01-Home or Self Care     Medication List    TAKE these medications   amLODipine 10 MG tablet Commonly known as:  NORVASC Take 10 mg by mouth daily.   diclofenac 75 MG EC tablet Commonly known as:  VOLTAREN Take 75 mg by mouth 2 (two) times daily.   GLUCOSAMINE CHONDROITIN COMPLX PO Take 1 tablet by mouth daily.   HYDROcodone-acetaminophen 5-325 MG tablet Commonly known as:  NORCO/VICODIN Take 1 tablet by mouth every 4 (four) hours as needed for moderate pain.   Menthol 1.27 % Pads Apply 1 each topically daily as needed (for pain).   omeprazole 20 MG capsule Commonly known as:  PRILOSEC Take 20 mg by mouth daily.   oxyCODONE-acetaminophen 5-325 MG tablet Commonly known as:  PERCOCET/ROXICET Take 1 tablet by mouth every 4 (four) hours as needed for severe pain.   tiZANidine 4 MG tablet Commonly known as:  ZANAFLEX Take 4 mg by mouth at bedtime.      Follow-up Information    Ancil LinseyJason Evan Davis, MD. Schedule an appointment as soon as possible for a visit on 07/15/2016.   Specialty:  General Surgery Why:  3 pm Contact information: [address] [facility] [place] [id number] [phone]           Signed: Ancil LinseyJason Evan Davis 07/05/2016, 4:52 PM

## 2016-07-29 DIAGNOSIS — M25562 Pain in left knee: Secondary | ICD-10-CM | POA: Diagnosis not present

## 2016-07-29 DIAGNOSIS — M25561 Pain in right knee: Secondary | ICD-10-CM | POA: Diagnosis not present

## 2016-07-29 DIAGNOSIS — M170 Bilateral primary osteoarthritis of knee: Secondary | ICD-10-CM | POA: Diagnosis not present

## 2016-10-27 DIAGNOSIS — K219 Gastro-esophageal reflux disease without esophagitis: Secondary | ICD-10-CM | POA: Diagnosis not present

## 2016-10-27 DIAGNOSIS — I10 Essential (primary) hypertension: Secondary | ICD-10-CM | POA: Diagnosis not present

## 2016-10-27 DIAGNOSIS — M170 Bilateral primary osteoarthritis of knee: Secondary | ICD-10-CM | POA: Diagnosis not present

## 2016-10-27 DIAGNOSIS — Z0000 Encounter for general adult medical examination without abnormal findings: Secondary | ICD-10-CM | POA: Diagnosis not present

## 2016-10-27 DIAGNOSIS — Z972 Presence of dental prosthetic device (complete) (partial): Secondary | ICD-10-CM | POA: Diagnosis not present

## 2016-10-27 DIAGNOSIS — Z87891 Personal history of nicotine dependence: Secondary | ICD-10-CM | POA: Diagnosis not present

## 2016-10-27 DIAGNOSIS — Z6830 Body mass index (BMI) 30.0-30.9, adult: Secondary | ICD-10-CM | POA: Diagnosis not present

## 2016-10-27 DIAGNOSIS — E669 Obesity, unspecified: Secondary | ICD-10-CM | POA: Diagnosis not present

## 2016-10-27 DIAGNOSIS — R252 Cramp and spasm: Secondary | ICD-10-CM | POA: Diagnosis not present

## 2016-10-29 ENCOUNTER — Ambulatory Visit (INDEPENDENT_AMBULATORY_CARE_PROVIDER_SITE_OTHER): Payer: Medicare HMO | Admitting: Physician Assistant

## 2016-10-29 ENCOUNTER — Encounter (INDEPENDENT_AMBULATORY_CARE_PROVIDER_SITE_OTHER): Payer: Self-pay

## 2016-10-29 ENCOUNTER — Encounter: Payer: Self-pay | Admitting: Physician Assistant

## 2016-10-29 VITALS — BP 119/76 | HR 85 | Temp 97.2°F | Ht 68.0 in | Wt 176.4 lb

## 2016-10-29 DIAGNOSIS — K219 Gastro-esophageal reflux disease without esophagitis: Secondary | ICD-10-CM

## 2016-10-29 DIAGNOSIS — M1712 Unilateral primary osteoarthritis, left knee: Secondary | ICD-10-CM | POA: Diagnosis not present

## 2016-10-29 DIAGNOSIS — Z1211 Encounter for screening for malignant neoplasm of colon: Secondary | ICD-10-CM

## 2016-10-29 DIAGNOSIS — M810 Age-related osteoporosis without current pathological fracture: Secondary | ICD-10-CM | POA: Diagnosis not present

## 2016-10-29 DIAGNOSIS — I10 Essential (primary) hypertension: Secondary | ICD-10-CM | POA: Diagnosis not present

## 2016-10-29 DIAGNOSIS — Z0000 Encounter for general adult medical examination without abnormal findings: Secondary | ICD-10-CM | POA: Diagnosis not present

## 2016-10-29 DIAGNOSIS — M25561 Pain in right knee: Secondary | ICD-10-CM | POA: Diagnosis not present

## 2016-10-29 DIAGNOSIS — M170 Bilateral primary osteoarthritis of knee: Secondary | ICD-10-CM | POA: Diagnosis not present

## 2016-10-29 DIAGNOSIS — M1711 Unilateral primary osteoarthritis, right knee: Secondary | ICD-10-CM | POA: Diagnosis not present

## 2016-10-29 DIAGNOSIS — M25562 Pain in left knee: Secondary | ICD-10-CM | POA: Diagnosis not present

## 2016-10-29 MED ORDER — TIZANIDINE HCL 4 MG PO TABS: 4.0000 mg | ORAL_TABLET | Freq: Three times a day (TID) | ORAL | 11 refills | Status: DC | PRN

## 2016-10-29 MED ORDER — RANITIDINE HCL 300 MG PO TABS: 150.0000 mg | ORAL_TABLET | Freq: Two times a day (BID) | ORAL | 11 refills | Status: DC

## 2016-10-29 MED ORDER — AMLODIPINE BESYLATE 10 MG PO TABS: 10.0000 mg | ORAL_TABLET | Freq: Every day | ORAL | 11 refills | Status: DC

## 2016-10-29 MED ORDER — DICLOFENAC SODIUM 75 MG PO TBEC: 75.0000 mg | DELAYED_RELEASE_TABLET | Freq: Two times a day (BID) | ORAL | 11 refills | Status: DC

## 2016-10-29 NOTE — Patient Instructions (Signed)
In a few days you may receive a survey in the mail or online from [employer] regarding your visit with us today. Please take a moment to fill this out. Your feedback is very important to our whole office. It can help us better understand your needs as well as improve your experience and satisfaction. Thank you for taking your time to complete it. We care about you.  Kaegan Stigler, PA-C  

## 2016-10-30 LAB — LIPID PANEL
CHOL/HDL RATIO: 2.8 ratio (ref 0.0–4.4)
Cholesterol, Total: 184 mg/dL (ref 100–199)
HDL: 65 mg/dL (ref >39)
LDL CALC: 112 mg/dL — AB (ref 0–99)
TRIGLYCERIDES: 35 mg/dL (ref 0–149)
VLDL Cholesterol Cal: 7 mg/dL (ref 5–40)

## 2016-10-30 LAB — CMP14+EGFR
ALBUMIN: 4.3 g/dL (ref 3.6–4.8)
ALT: 17 IU/L (ref 0–32)
AST: 27 IU/L (ref 0–40)
Albumin/Globulin Ratio: 1.7 (ref 1.2–2.2)
Alkaline Phosphatase: 111 IU/L (ref 39–117)
BUN/Creatinine Ratio: 39 — ABNORMAL HIGH (ref 12–28)
BUN: 22 mg/dL (ref 8–27)
Bilirubin Total: 0.4 mg/dL (ref 0.0–1.2)
CO2: 21 mmol/L (ref 18–29)
CREATININE: 0.57 mg/dL (ref 0.57–1.00)
Calcium: 9.2 mg/dL (ref 8.7–10.3)
Chloride: 102 mmol/L (ref 96–106)
GFR, EST AFRICAN AMERICAN: 109 mL/min/{1.73_m2} (ref >59)
GFR, EST NON AFRICAN AMERICAN: 95 mL/min/{1.73_m2} (ref >59)
GLOBULIN, TOTAL: 2.5 g/dL (ref 1.5–4.5)
GLUCOSE: 72 mg/dL (ref 65–99)
Potassium: 4.4 mmol/L (ref 3.5–5.2)
SODIUM: 141 mmol/L (ref 134–144)
TOTAL PROTEIN: 6.8 g/dL (ref 6.0–8.5)

## 2016-10-30 LAB — CBC WITH DIFFERENTIAL/PLATELET
BASOS: 1 %
Basophils Absolute: 0.1 10*3/uL (ref 0.0–0.2)
EOS (ABSOLUTE): 0.2 10*3/uL (ref 0.0–0.4)
EOS: 6 %
HEMATOCRIT: 34.2 % (ref 34.0–46.6)
HEMOGLOBIN: 10.6 g/dL — AB (ref 11.1–15.9)
Immature Grans (Abs): 0 10*3/uL (ref 0.0–0.1)
Immature Granulocytes: 0 %
LYMPHS ABS: 1.2 10*3/uL (ref 0.7–3.1)
Lymphs: 27 %
MCH: 25.1 pg — AB (ref 26.6–33.0)
MCHC: 31 g/dL — AB (ref 31.5–35.7)
MCV: 81 fL (ref 79–97)
MONOCYTES: 7 %
Monocytes Absolute: 0.3 10*3/uL (ref 0.1–0.9)
NEUTROS ABS: 2.4 10*3/uL (ref 1.4–7.0)
Neutrophils: 59 %
Platelets: 307 10*3/uL (ref 150–379)
RBC: 4.22 x10E6/uL (ref 3.77–5.28)
RDW: 17.4 % — ABNORMAL HIGH (ref 12.3–15.4)
WBC: 4.2 10*3/uL (ref 3.4–10.8)

## 2016-10-30 LAB — TSH: TSH: 1.61 u[IU]/mL (ref 0.450–4.500)

## 2016-11-03 DIAGNOSIS — Z1211 Encounter for screening for malignant neoplasm of colon: Secondary | ICD-10-CM | POA: Insufficient documentation

## 2016-11-03 DIAGNOSIS — K219 Gastro-esophageal reflux disease without esophagitis: Secondary | ICD-10-CM | POA: Insufficient documentation

## 2016-11-03 DIAGNOSIS — I10 Essential (primary) hypertension: Secondary | ICD-10-CM | POA: Insufficient documentation

## 2016-11-03 DIAGNOSIS — M810 Age-related osteoporosis without current pathological fracture: Secondary | ICD-10-CM | POA: Insufficient documentation

## 2016-11-03 DIAGNOSIS — M170 Bilateral primary osteoarthritis of knee: Secondary | ICD-10-CM | POA: Insufficient documentation

## 2016-11-03 NOTE — Progress Notes (Signed)
BP 119/76   Pulse 85   Temp 97.2 F (36.2 C) (Oral)   Ht _0  (1.727 m)   Wt 176 lb 6.4 oz (80 kg)   BMI 26.82 kg/m    Subjective:    Patient ID: Tiffany Bradley, female    DOB: 1946-02-18, 71 y.o.   MRN: [id number]  Tiffany Bradley is a 71 y.o. female presenting on 10/29/2016 for Establish Care and Follow-up  HPI Patient here to be established as new patient at [facility].  This patient is known to me from [facility]. This patient comes in for periodic recheck on medications and conditions including osteoarthritis, hypertension, GERD, osteoporosis.   All medications are reviewed today. There are no reports of any problems with the medications. All of the medical conditions are reviewed and updated.  Lab work is reviewed and will be ordered as medically necessary. There are no new problems reported with today's visit.   Past Medical History:  Diagnosis Date  . Arthritis   . Back pain   . Cataract   . DJD (degenerative joint disease)   . Full dentures   . GERD (gastroesophageal reflux disease)   . Glaucoma   . Hypertension   . Wears glasses    Relevant past medical, surgical, family and social history reviewed and updated as indicated. Interim medical history since our last visit reviewed. Allergies and medications reviewed and updated.   Data reviewed from any sources in EPIC.  Review of Systems  Constitutional: Negative.  Negative for activity change, fatigue and fever.  HENT: Negative.   Eyes: Negative.   Respiratory: Negative.  Negative for cough.   Cardiovascular: Negative.  Negative for chest pain.  Gastrointestinal: Negative.  Negative for abdominal pain.  Endocrine: Negative.   Genitourinary: Negative.  Negative for dysuria.  Musculoskeletal: Negative.   Skin: Negative.   Neurological: Negative.      Social History   Social History  . Marital status: Divorced    Spouse name: N/A  . Number of children: N/A  . Years of education:  N/A   Occupational History  . Not on file.   Social History Main Topics  . Smoking status: Former Smoker    Types: Cigarettes    Quit date: 04/20/1993  . Smokeless tobacco: Never Used  . Alcohol use Yes     Comment: rare  . Drug use: No  . Sexual activity: Not Currently   Other Topics Concern  . Not on file   Social History Narrative  . No narrative on file    Past Surgical History:  Procedure Laterality Date  . BREAST REDUCTION SURGERY Bilateral 04/25/2013   Procedure: MAMMARY REDUCTION  (BREAST);  Surgeon: Cristine Polio, MD;  Location: [facility];  Service: Plastics;  Laterality: Bilateral;  . COLON SURGERY    . COLONOSCOPY    . COSMETIC SURGERY    . PARTIAL COLECTOMY N/A 06/29/2016   Procedure: PARTIAL COLECTOMY;  Surgeon: Vickie Epley, MD;  Location: [facility];  Service: General;  Laterality: N/A;  . TUBAL LIGATION      Family History  Problem Relation Age of Onset  . Kidney disease Mother   . Stroke Mother   . Heart disease Father     Allergies as of 10/29/2016   No Known Allergies     Medication List       Accurate as of 10/29/16 11:59 PM. Always use your most recent med list.  amLODipine 10 MG tablet Commonly known as:  NORVASC Take 1 tablet (10 mg total) by mouth daily.   diclofenac 75 MG EC tablet Commonly known as:  VOLTAREN Take 1 tablet (75 mg total) by mouth 2 (two) times daily.   GLUCOSAMINE CHONDROITIN COMPLX PO Take 1 tablet by mouth daily.   Menthol 1.27 % Pads Apply 1 each topically daily as needed (for pain).   MULTI-VITAMIN GUMMIES PO Take by mouth.   ranitidine 300 MG tablet Commonly known as:  ZANTAC Take 0.5 tablets (150 mg total) by mouth 2 (two) times daily.   tiZANidine 4 MG tablet Commonly known as:  ZANAFLEX Take 1 tablet (4 mg total) by mouth every 8 (eight) hours as needed for muscle spasms.          Objective:    BP 119/76   Pulse 85   Temp 97.2 F (36.2 C) (Oral)   Ht _0   (1.727 m)   Wt 176 lb 6.4 oz (80 kg)   BMI 26.82 kg/m   No Known Allergies Wt Readings from Last 3 Encounters:  10/29/16 176 lb 6.4 oz (80 kg)  06/30/16 184 lb 8.4 oz (83.7 kg)  04/25/13 181 lb 8 oz (82.3 kg)    Physical Exam  Constitutional: She is oriented to person, place, and time. She appears well-developed and well-nourished.  HENT:  Head: Normocephalic and atraumatic.  Right Ear: Tympanic membrane, external ear and ear canal normal.  Left Ear: Tympanic membrane, external ear and ear canal normal.  Nose: Nose normal. No rhinorrhea.  Mouth/Throat: Oropharynx is clear and moist and mucous membranes are normal. No oropharyngeal exudate or posterior oropharyngeal erythema.  Eyes: Conjunctivae and EOM are normal. Pupils are equal, round, and reactive to light.  Neck: Normal range of motion. Neck supple.  Cardiovascular: Normal rate, regular rhythm, normal heart sounds and intact distal pulses.   Pulmonary/Chest: Effort normal and breath sounds normal.  Abdominal: Soft. Bowel sounds are normal.  Musculoskeletal: She exhibits edema and deformity.  Neurological: She is alert and oriented to person, place, and time. She has normal reflexes.  Skin: Skin is warm and dry. No rash noted.  Psychiatric: She has a normal mood and affect. Her behavior is normal. Judgment and thought content normal.    Results for orders placed or performed in visit on 10/29/16  CMP14+EGFR  Result Value Ref Range   Glucose 72 65 - 99 mg/dL   BUN 22 8 - 27 mg/dL   Creatinine, Ser 0.57 0.57 - 1.00 mg/dL   GFR calc non Af Amer 95 >59 mL/min/1.73   GFR calc Af Amer 109 >59 mL/min/1.73   BUN/Creatinine Ratio 39 (H) 12 - 28   Sodium 141 134 - 144 mmol/L   Potassium 4.4 3.5 - 5.2 mmol/L   Chloride 102 96 - 106 mmol/L   CO2 21 18 - 29 mmol/L   Calcium 9.2 8.7 - 10.3 mg/dL   Total Protein 6.8 6.0 - 8.5 g/dL   Albumin 4.3 3.6 - 4.8 g/dL   Globulin, Total 2.5 1.5 - 4.5 g/dL   Albumin/Globulin Ratio 1.7 1.2 -  2.2   Bilirubin Total 0.4 0.0 - 1.2 mg/dL   Alkaline Phosphatase 111 39 - 117 IU/L   AST 27 0 - 40 IU/L   ALT 17 0 - 32 IU/L  CBC with Differential  Result Value Ref Range   WBC 4.2 3.4 - 10.8 x10E3/uL   RBC 4.22 3.77 - 5.28 x10E6/uL   Hemoglobin 10.6 (  L) 11.1 - 15.9 g/dL   Hematocrit 34.2 34.0 - 46.6 %   MCV 81 79 - 97 fL   MCH 25.1 (L) 26.6 - 33.0 pg   MCHC 31.0 (L) 31.5 - 35.7 g/dL   RDW 17.4 (H) 12.3 - 15.4 %   Platelets 307 150 - 379 x10E3/uL   Neutrophils 59 Not Estab. %   Lymphs 27 Not Estab. %   Monocytes 7 Not Estab. %   Eos 6 Not Estab. %   Basos 1 Not Estab. %   Neutrophils Absolute 2.4 1.4 - 7.0 x10E3/uL   Lymphocytes Absolute 1.2 0.7 - 3.1 x10E3/uL   Monocytes Absolute 0.3 0.1 - 0.9 x10E3/uL   EOS (ABSOLUTE) 0.2 0.0 - 0.4 x10E3/uL   Basophils Absolute 0.1 0.0 - 0.2 x10E3/uL   Immature Granulocytes 0 Not Estab. %   Immature Grans (Abs) 0.0 0.0 - 0.1 x10E3/uL  Lipid panel  Result Value Ref Range   Cholesterol, Total 184 100 - 199 mg/dL   Triglycerides 35 0 - 149 mg/dL   HDL 65 >39 mg/dL   VLDL Cholesterol Cal 7 5 - 40 mg/dL   LDL Calculated 112 (H) 0 - 99 mg/dL   Chol/HDL Ratio 2.8 0.0 - 4.4 ratio units  TSH  Result Value Ref Range   TSH 1.610 0.450 - 4.500 uIU/mL      Assessment & Plan:   1. Screening for colon cancer - Ambulatory referral to Gastroenterology  2. Essential hypertension - amLODipine (NORVASC) 10 MG tablet; Take 1 tablet (10 mg total) by mouth daily.  Dispense: 30 tablet; Refill: 11  3. Primary osteoarthritis of both knees - diclofenac (VOLTAREN) 75 MG EC tablet; Take 1 tablet (75 mg total) by mouth 2 (two) times daily.  Dispense: 60 tablet; Refill: 11 - tiZANidine (ZANAFLEX) 4 MG tablet; Take 1 tablet (4 mg total) by mouth every 8 (eight) hours as needed for muscle spasms.  Dispense: 60 tablet; Refill: 11  4. Gastroesophageal reflux disease without esophagitis - ranitidine (ZANTAC) 300 MG tablet; Take 0.5 tablets (150 mg total) by  mouth 2 (two) times daily.  Dispense: 60 tablet; Refill: 11  5. Age-related osteoporosis without current pathological fracture - DG [facility] DEXA  6. Well adult exam Not performed, listed for lab ordering - CMP14+EGFR - CBC with Differential - Lipid panel - TSH   Continue all other maintenance medications as listed above. Educational handout given for health information  Follow up plan: Return in about 6 months (around 04/28/2017) for recheck.  Terald Sleeper PA-C [facility] [address]  [place], [place] [address] [phone]   11/03/2016, 8:11 AM

## 2016-11-11 ENCOUNTER — Telehealth: Payer: Self-pay

## 2016-11-11 NOTE — Telephone Encounter (Signed)
[phone] patient received letter to schedule tcs

## 2016-11-13 DIAGNOSIS — Z79899 Other long term (current) drug therapy: Secondary | ICD-10-CM | POA: Diagnosis not present

## 2016-11-13 DIAGNOSIS — I10 Essential (primary) hypertension: Secondary | ICD-10-CM | POA: Diagnosis not present

## 2016-11-13 DIAGNOSIS — K219 Gastro-esophageal reflux disease without esophagitis: Secondary | ICD-10-CM | POA: Diagnosis not present

## 2016-11-13 DIAGNOSIS — Z791 Long term (current) use of non-steroidal anti-inflammatories (NSAID): Secondary | ICD-10-CM | POA: Diagnosis not present

## 2016-11-13 DIAGNOSIS — R1013 Epigastric pain: Secondary | ICD-10-CM | POA: Diagnosis not present

## 2016-11-13 DIAGNOSIS — Z87891 Personal history of nicotine dependence: Secondary | ICD-10-CM | POA: Diagnosis not present

## 2016-11-14 ENCOUNTER — Telehealth: Payer: Self-pay | Admitting: Physician Assistant

## 2016-11-14 MED ORDER — OMEPRAZOLE 40 MG PO CPDR: 40.0000 mg | DELAYED_RELEASE_CAPSULE | Freq: Every day | ORAL | 11 refills | Status: DC

## 2016-11-14 NOTE — Telephone Encounter (Signed)
What symptoms do you have? Vomiting, er f/u for acute gastric pain.   How long have you been sick? Wants to go back on her omeprazole. Please send in an rx to [facility] [place].   Have you been seen for this problem? yes  If your provider decides to give you a prescription, which pharmacy would you like for it to be sent to? [facility] in [place].   Patient informed that this information will be sent to the clinical staff for review and that they should receive a follow up call.

## 2016-11-14 NOTE — Telephone Encounter (Signed)
[facility] Ranitidine is not helping would like to change back to omeprazole. Continues with vomiting this morning as well.

## 2016-11-14 NOTE — Telephone Encounter (Signed)
I called pt to triage. She had a perforated colon in 06/2016.  She is also having problems with reflux. OV with Tana CoastLeslie Lewis, PA on 12/12/2016 at 10:00 AM.

## 2016-11-14 NOTE — Telephone Encounter (Signed)
Patient aware that medication has been sent to the pharmacy 

## 2016-11-19 ENCOUNTER — Ambulatory Visit: Payer: Medicare HMO | Admitting: Physician Assistant

## 2016-11-19 ENCOUNTER — Encounter: Payer: Self-pay | Admitting: Physician Assistant

## 2016-11-19 ENCOUNTER — Ambulatory Visit (INDEPENDENT_AMBULATORY_CARE_PROVIDER_SITE_OTHER): Payer: Medicare HMO | Admitting: Physician Assistant

## 2016-11-19 VITALS — BP 125/80 | HR 101 | Temp 96.8°F | Ht 68.0 in | Wt 169.0 lb

## 2016-11-19 DIAGNOSIS — K279 Peptic ulcer, site unspecified, unspecified as acute or chronic, without hemorrhage or perforation: Secondary | ICD-10-CM | POA: Diagnosis not present

## 2016-11-19 DIAGNOSIS — K219 Gastro-esophageal reflux disease without esophagitis: Secondary | ICD-10-CM | POA: Diagnosis not present

## 2016-11-19 MED ORDER — SUCRALFATE 1 G PO TABS: 1.0000 g | ORAL_TABLET | Freq: Three times a day (TID) | ORAL | 1 refills | Status: DC

## 2016-11-19 NOTE — Progress Notes (Signed)
BP 125/80   Pulse (!) 101   Temp (!) 96.8 F (36 C) (Oral)   Ht '5\' 8"'  (1.727 m)   Wt 169 lb (76.7 kg)   BMI 25.70 kg/m    Subjective:    Patient ID: Tiffany Bradley, female    DOB: 21-Feb-1946, 71 y.o.   MRN: [id number]  HPI: Tiffany Bradley is a 71 y.o. female presenting on 11/19/2016 for Hospitalization Follow-up [facility])  Patient was seen a couple days ago through the [facility] emergency room. She was given a GI cocktail and she reports that this helped her upper abdominal and epigastric pain tremendously. She had stopped her proton pump inhibitor a few weeks ago thinking it was causing joint pain. She has been taking Zantac. She states that she noticed the increase in her GERD symptoms on this medication. She also had eaten a lot of acidic foods. She thinks the combination has irritated her stomach. Diclofenac was not stopped through the emergency room. We will do this now. She is an appointment April 14 with a gastroenterologist. At this time we will add Carafate to see if it gives her some relief in her GI symptoms. I have encouraged her to keep a bland diet for another week or 2. Continue the omeprazole at 40 mg daily  Relevant past medical, surgical, family and social history reviewed and updated as indicated. Allergies and medications reviewed and updated.  Past Medical History:  Diagnosis Date  . Arthritis   . Back pain   . Cataract   . DJD (degenerative joint disease)   . Full dentures   . GERD (gastroesophageal reflux disease)   . Glaucoma   . Hypertension   . Wears glasses     Past Surgical History:  Procedure Laterality Date  . BREAST REDUCTION SURGERY Bilateral 04/25/2013   Procedure: MAMMARY REDUCTION  (BREAST);  Surgeon: Cristine Polio, MD;  Location: [facility];  Service: Plastics;  Laterality: Bilateral;  . COLON SURGERY    . COLONOSCOPY    . COSMETIC SURGERY    . PARTIAL COLECTOMY N/A 06/29/2016   Procedure: PARTIAL COLECTOMY;  Surgeon: Vickie Epley, MD;  Location: [facility];  Service: General;  Laterality: N/A;  . TUBAL LIGATION      Review of Systems  Constitutional: Positive for appetite change and fatigue. Negative for fever and unexpected weight change.  HENT: Negative.   Eyes: Negative.   Respiratory: Negative.  Negative for cough, shortness of breath and wheezing.   Cardiovascular: Negative for chest pain, palpitations and leg swelling.  Gastrointestinal: Positive for abdominal distention, abdominal pain and nausea. Negative for rectal pain and vomiting.  Genitourinary: Negative.     Allergies as of 11/19/2016   No Known Allergies     Medication List       Accurate as of 11/19/16 12:05 PM. Always use your most recent med list.          amLODipine 10 MG tablet Commonly known as:  NORVASC Take 1 tablet (10 mg total) by mouth daily.   GLUCOSAMINE CHONDROITIN COMPLX PO Take 1 tablet by mouth daily.   Menthol 1.27 % Pads Apply 1 each topically daily as needed (for pain).   MULTI-VITAMIN GUMMIES PO Take by mouth.   omeprazole 40 MG capsule Commonly known as:  PRILOSEC Take 1 capsule (40 mg total) by mouth daily.   sucralfate 1 g tablet Commonly known as:  CARAFATE Take 1 tablet (1 g total) by mouth 4 (four) times daily -  with meals and at bedtime.   tiZANidine 4 MG tablet Commonly known as:  ZANAFLEX Take 1 tablet (4 mg total) by mouth every 8 (eight) hours as needed for muscle spasms.          Objective:    BP 125/80   Pulse (!) 101   Temp (!) 96.8 F (36 C) (Oral)   Ht '5\' 8"'  (1.727 m)   Wt 169 lb (76.7 kg)   BMI 25.70 kg/m   No Known Allergies  Physical Exam  Constitutional: She is oriented to person, place, and time. She appears well-developed and well-nourished.  HENT:  Head: Normocephalic and atraumatic.  Eyes: Conjunctivae and EOM are normal. Pupils are equal, round, and reactive to light.  Cardiovascular: Normal rate, regular rhythm, normal heart sounds and intact distal pulses.     Pulmonary/Chest: Effort normal and breath sounds normal.  Abdominal: Soft. Bowel sounds are normal. She exhibits no mass. There is tenderness in the epigastric area. There is no rigidity, no rebound and no guarding.  Neurological: She is alert and oriented to person, place, and time. She has normal reflexes.  Skin: Skin is warm and dry. No rash noted.  Psychiatric: She has a normal mood and affect. Her behavior is normal. Judgment and thought content normal.    Results for orders placed or performed in visit on 10/29/16  CMP14+EGFR  Result Value Ref Range   Glucose 72 65 - 99 mg/dL   BUN 22 8 - 27 mg/dL   Creatinine, Ser 0.57 0.57 - 1.00 mg/dL   GFR calc non Af Amer 95 >59 mL/min/1.73   GFR calc Af Amer 109 >59 mL/min/1.73   BUN/Creatinine Ratio 39 (H) 12 - 28   Sodium 141 134 - 144 mmol/L   Potassium 4.4 3.5 - 5.2 mmol/L   Chloride 102 96 - 106 mmol/L   CO2 21 18 - 29 mmol/L   Calcium 9.2 8.7 - 10.3 mg/dL   Total Protein 6.8 6.0 - 8.5 g/dL   Albumin 4.3 3.6 - 4.8 g/dL   Globulin, Total 2.5 1.5 - 4.5 g/dL   Albumin/Globulin Ratio 1.7 1.2 - 2.2   Bilirubin Total 0.4 0.0 - 1.2 mg/dL   Alkaline Phosphatase 111 39 - 117 IU/L   AST 27 0 - 40 IU/L   ALT 17 0 - 32 IU/L  CBC with Differential  Result Value Ref Range   WBC 4.2 3.4 - 10.8 x10E3/uL   RBC 4.22 3.77 - 5.28 x10E6/uL   Hemoglobin 10.6 (L) 11.1 - 15.9 g/dL   Hematocrit 34.2 34.0 - 46.6 %   MCV 81 79 - 97 fL   MCH 25.1 (L) 26.6 - 33.0 pg   MCHC 31.0 (L) 31.5 - 35.7 g/dL   RDW 17.4 (H) 12.3 - 15.4 %   Platelets 307 150 - 379 x10E3/uL   Neutrophils 59 Not Estab. %   Lymphs 27 Not Estab. %   Monocytes 7 Not Estab. %   Eos 6 Not Estab. %   Basos 1 Not Estab. %   Neutrophils Absolute 2.4 1.4 - 7.0 x10E3/uL   Lymphocytes Absolute 1.2 0.7 - 3.1 x10E3/uL   Monocytes Absolute 0.3 0.1 - 0.9 x10E3/uL   EOS (ABSOLUTE) 0.2 0.0 - 0.4 x10E3/uL   Basophils Absolute 0.1 0.0 - 0.2 x10E3/uL   Immature Granulocytes 0 Not Estab. %    Immature Grans (Abs) 0.0 0.0 - 0.1 x10E3/uL  Lipid panel  Result Value Ref Range   Cholesterol, Total 184 100 -  199 mg/dL   Triglycerides 35 0 - 149 mg/dL   HDL 65 >39 mg/dL   VLDL Cholesterol Cal 7 5 - 40 mg/dL   LDL Calculated 112 (H) 0 - 99 mg/dL   Chol/HDL Ratio 2.8 0.0 - 4.4 ratio units  TSH  Result Value Ref Range   TSH 1.610 0.450 - 4.500 uIU/mL      Assessment & Plan:   1. Gastroesophageal reflux disease without esophagitis STOP diclofenac - sucralfate (CARAFATE) 1 g tablet; Take 1 tablet (1 g total) by mouth 4 (four) times daily -  with meals and at bedtime.  Dispense: 120 tablet; Refill: 1  2. PUD (peptic ulcer disease) STOP diclofenac - sucralfate (CARAFATE) 1 g tablet; Take 1 tablet (1 g total) by mouth 4 (four) times daily -  with meals and at bedtime.  Dispense: 120 tablet; Refill: 1   Current Outpatient Prescriptions:  .  amLODipine (NORVASC) 10 MG tablet, Take 1 tablet (10 mg total) by mouth daily., Disp: 30 tablet, Rfl: 11 .  GLUCOSAMINE CHONDROITIN COMPLX PO, Take 1 tablet by mouth daily., Disp: , Rfl:  .  Menthol 1.27 % PADS, Apply 1 each topically daily as needed (for pain)., Disp: , Rfl:  .  Multiple Vitamins-Minerals (MULTI-VITAMIN GUMMIES PO), Take by mouth., Disp: , Rfl:  .  omeprazole (PRILOSEC) 40 MG capsule, Take 1 capsule (40 mg total) by mouth daily., Disp: 30 capsule, Rfl: 11 .  tiZANidine (ZANAFLEX) 4 MG tablet, Take 1 tablet (4 mg total) by mouth every 8 (eight) hours as needed for muscle spasms., Disp: 60 tablet, Rfl: 11 .  sucralfate (CARAFATE) 1 g tablet, Take 1 tablet (1 g total) by mouth 4 (four) times daily -  with meals and at bedtime., Disp: 120 tablet, Rfl: 1  Continue all other maintenance medications as listed above.  Follow up plan: Return if symptoms worsen or fail to improve.  Educational handout given for [place] PA-C [facility] [address]  [place], [place]  [address] [phone]   11/19/2016, 12:05 PM

## 2016-11-19 NOTE — Patient Instructions (Signed)
Food Choices for Gastroesophageal Reflux Disease, Adult When you have gastroesophageal reflux disease (GERD), the foods you eat and your eating habits are very important. Choosing the right foods can help ease your discomfort. What guidelines do I need to follow?  Choose fruits, vegetables, whole grains, and low-fat dairy products.  Choose low-fat meat, fish, and poultry.  Limit fats such as oils, salad dressings, butter, nuts, and avocado.  Keep a food diary. This helps you identify foods that cause symptoms.  Avoid foods that cause symptoms. These may be different for everyone.  Eat small meals often instead of 3 large meals a day.  Eat your meals slowly, in a place where you are relaxed.  Limit fried foods.  Cook foods using methods other than frying.  Avoid drinking alcohol.  Avoid drinking large amounts of liquids with your meals.  Avoid bending over or lying down until 2-3 hours after eating. What foods are not recommended? These are some foods and drinks that may make your symptoms worse: Vegetables  Tomatoes. Tomato juice. Tomato and spaghetti sauce. Chili peppers. Onion and garlic. Horseradish. Fruits  Oranges, grapefruit, and lemon (fruit and juice). Meats  High-fat meats, fish, and poultry. This includes hot dogs, ribs, ham, sausage, salami, and bacon. Dairy  Whole milk and chocolate milk. Sour cream. Cream. Butter. Ice cream. Cream cheese. Drinks  Coffee and tea. Bubbly (carbonated) drinks or energy drinks. Condiments  Hot sauce. Barbecue sauce. Sweets/Desserts  Chocolate and cocoa. Donuts. Peppermint and spearmint. Fats and Oils  High-fat foods. This includes French fries and potato chips. Other  Vinegar. Strong spices. This includes black pepper, white pepper, red pepper, cayenne, curry powder, cloves, ginger, and chili powder. The items listed above may not be a complete list of foods and drinks to avoid. Contact your dietitian for more information.    This information is not intended to replace advice given to you by your health care provider. Make sure you discuss any questions you have with your health care provider. Document Released: 02/17/2012 Document Revised: 01/24/2016 Document Reviewed: 06/22/2013 Elsevier Interactive Patient Education  2017 [employer]  

## 2016-12-12 ENCOUNTER — Encounter: Payer: Self-pay | Admitting: Gastroenterology

## 2016-12-12 ENCOUNTER — Ambulatory Visit (INDEPENDENT_AMBULATORY_CARE_PROVIDER_SITE_OTHER): Payer: Medicare HMO | Admitting: Gastroenterology

## 2016-12-12 ENCOUNTER — Other Ambulatory Visit: Payer: Self-pay

## 2016-12-12 VITALS — BP 130/84 | HR 81 | Temp 97.4°F | Ht 65.0 in | Wt 167.2 lb

## 2016-12-12 DIAGNOSIS — R1013 Epigastric pain: Secondary | ICD-10-CM | POA: Insufficient documentation

## 2016-12-12 DIAGNOSIS — K219 Gastro-esophageal reflux disease without esophagitis: Secondary | ICD-10-CM | POA: Diagnosis not present

## 2016-12-12 DIAGNOSIS — K631 Perforation of intestine (nontraumatic): Secondary | ICD-10-CM

## 2016-12-12 MED ORDER — PEG 3350-KCL-NA BICARB-NACL 420 G PO SOLR: 4000.0000 mL | ORAL | 0 refills | Status: DC

## 2016-12-12 NOTE — Progress Notes (Signed)
Primary Care Physician:  Remus Loffler, PA-C  Primary Gastroenterologist:  Jonette Meika, MD   Chief Complaint  Patient presents with  . Colonoscopy    last tcs 10-11 yrs ago; surgery 06/2016 at [facility]-had whole in colon    HPI:  Tiffany Bradley is a 71 y.o. female here to schedule colonoscopy. Back in October she had spontaneous perforation of the mid sigmoid colon. There was no identifiable diverticulosis or colon mass. CT showed proximal sigmoid colon distended with formed stool, an 8 cm segment was distended up to 4.2 cm. 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 She underwent a partial colectomy on 06/29/2016 by Dr. Satira Mccallum.  On CT she also had 2 cm mass in the parasternal lower inner quadrant of the right breast needing further evaluation and the left lower lobe pulmonary nodule, 8 mm needing further evaluation.Patient reports no follow-up has been done.  Over the past couple months she's had some problems with epigastric pain. She states it all started when she ask her PCP to take her off omeprazole because of her fear of side effects. She had been on omeprazole for about 10 years. She was put on ranitidine. Patient was also on diclofenac chronically for about the last year. Within a few days started having epigastric pain, refractory GERD and reports having to go to the ER. PCP stopped her diclofenac, added back omeprazole along with Carafate. Started to feel some improvement. Has been back on her medication for about 3-4 weeks. Continues to have to be careful with what she eats or she develops abdominal pain. She recently resumed her diclofenac. Denies dysphagia. No further vomiting. Bowel movements regular. No melena rectal bleeding.  She gives a history of bleeding ulcers in the remote past. She is unsure of the etiology. She recalls being hospitalized at least twice for ulcers.  Last colonoscopy over  10 years ago, she reports it to be normal.      Current Outpatient Prescriptions  Medication Sig Dispense Refill  . amLODipine (NORVASC) 10 MG tablet Take 1 tablet (10 mg total) by mouth daily. 30 tablet 11  . diclofenac (VOLTAREN) 75 MG EC tablet Take 75 mg by mouth 2 (two) times daily.    Marland Kitchen GLUCOSAMINE CHONDROITIN COMPLX PO Take 1 tablet by mouth daily.    . Menthol 1.27 % PADS Apply 1 each topically daily as needed (for pain).    . Multiple Vitamins-Minerals (MULTI-VITAMIN GUMMIES PO) Take by mouth daily.     Marland Kitchen omeprazole (PRILOSEC) 40 MG capsule Take 1 capsule (40 mg total) by mouth daily. 30 capsule 11  . sucralfate (CARAFATE) 1 g tablet Take 1 tablet (1 g total) by mouth 4 (four) times daily -  with meals and at bedtime. 120 tablet 1  . tiZANidine (ZANAFLEX) 4 MG tablet Take 1 tablet (4 mg total) by mouth every 8 (eight) hours as needed for muscle spasms. 60 tablet 11  . polyethylene glycol-electrolytes (TRILYTE) 420 g solution Take 4,000 mLs by mouth as directed. 4000 mL 0   No current facility-administered medications for this visit.     Allergies as of 12/12/2016  . (No Known Allergies)    Past Medical History:  Diagnosis Date  . Arthritis   . Back pain   . Cataract   . DJD (degenerative joint disease)   . Full dentures   . GERD (gastroesophageal reflux disease)   . Glaucoma   .  History of bleeding ulcers    patient reports multiple hospitalizations remotely  . Hypertension   . Wears glasses     Past Surgical History:  Procedure Laterality Date  . BREAST REDUCTION SURGERY Bilateral 04/25/2013   Procedure: MAMMARY REDUCTION  (BREAST);  Surgeon: Louisa Second, MD;  Location: [facility];  Service: Plastics;  Laterality: Bilateral;  . COLONOSCOPY     2008, normal per patient  . COSMETIC SURGERY    . PARTIAL COLECTOMY N/A 06/29/2016   Procedure: PARTIAL COLECTOMY;  Surgeon: Ancil Linsey, MD;  Location: [facility];  Service: General;  Laterality:  N/A;  . TUBAL LIGATION      Family History  Problem Relation Age of Onset  . Kidney disease Mother   . Stroke Mother   . Heart disease Father   . Colon cancer Neg Hx     Social History   Social History  . Marital status: Divorced    Spouse name: N/A  . Number of children: N/A  . Years of education: N/A   Occupational History  . Not on file.   Social History Main Topics  . Smoking status: Former Smoker    Types: Cigarettes    Quit date: 04/20/1993  . Smokeless tobacco: Never Used  . Alcohol use Yes     Comment: rare  . Drug use: No  . Sexual activity: Not Currently   Other Topics Concern  . Not on file   Social History Narrative  . No narrative on file      ROS:  General: Negative for anorexia, weight loss, fever, chills, fatigue, weakness. Eyes: Negative for vision changes.  ENT: Negative for hoarseness, difficulty swallowing , nasal congestion. CV: Negative for chest pain, angina, palpitations, dyspnea on exertion, peripheral edema.  Respiratory: Negative for dyspnea at rest, dyspnea on exertion, cough, sputum, wheezing.  GI: See history of present illness. GU:  Negative for dysuria, hematuria, urinary incontinence, urinary frequency, nocturnal urination.  MS: chronic joint pain, no low back pain.  Derm: Negative for rash or itching.  Neuro: Negative for weakness, abnormal sensation, seizure, frequent headaches, memory loss, confusion.  Psych: Negative for anxiety, depression, suicidal ideation, hallucinations.  Endo: Negative for unusual weight change.  Heme: Negative for bruising or bleeding. Allergy: Negative for rash or hives.    Physical Examination:  BP 130/84   Pulse 81   Temp 97.4 F (36.3 C) (Oral)   Ht  (1.651 m)   Wt 167 lb 3.2 oz (75.8 kg)   BMI 27.82 kg/m    General: Well-nourished, well-developed in no acute distress.  Head: Normocephalic, atraumatic.   Eyes: Conjunctiva pink, no icterus. Mouth: Oropharyngeal mucosa moist and  pink , no lesions erythema or exudate. Neck: Supple without thyromegaly, masses, or lymphadenopathy.  Lungs: Clear to auscultation bilaterally.  Heart: Regular rate and rhythm, no murmurs rubs or gallops.  Abdomen: Bowel sounds are normal, nontender, nondistended, no hepatosplenomegaly or masses, no abdominal bruits or    hernia , no rebound or guarding.   Rectal: not performed Extremities: No lower extremity edema. No clubbing or deformities.  Neuro: Alert and oriented x 4 , grossly normal neurologically.  Skin: Warm and dry, no rash or jaundice.   Psych: Alert and cooperative, normal mood and affect.  Labs: Lab Results  Component Value Date   CREATININE 0.57 10/29/2016   BUN 22 10/29/2016   NA 141 10/29/2016   K 4.4 10/29/2016   CL 102 10/29/2016   CO2 21  10/29/2016   Lab Results  Component Value Date   ALT 17 10/29/2016   AST 27 10/29/2016   ALKPHOS 111 10/29/2016   BILITOT 0.4 10/29/2016   Lab Results  Component Value Date   TSH 1.610 10/29/2016   Lab Results  Component Value Date   WBC 4.2 10/29/2016   HGB 10.6L 10/29/2016   HCT 34.2 10/29/2016   MCV 81 10/29/2016   PLT 307 10/29/2016     Imaging Studies: No results found.

## 2016-12-12 NOTE — Assessment & Plan Note (Signed)
71 year old female presenting to schedule colonoscopy for history of perforated colon back in October requiring surgery. No colon mass or diverticulosis seen. Last colonoscopy over 10 years ago. Recommend updated colonoscopy at this time. Would also recommend upper endoscopy due to patient's recent epigastric pain, vomiting in the setting of chronic NSAID use and with a history of prior bleeding ulcers. Symptoms occurred after stopping PPI therapy.  I have discussed the risks, alternatives, benefits with regards to but not limited to the risk of reaction to medication, bleeding, infection, perforation and the patient is agreeable to proceed. Written consent to be obtained.  Recommend continuing omeprazole. Would complete current Carafate prescription and then stop. Further recommendations regarding NSAID use based on EGD findings. Discussed with the patient the need to take concomitant PPI therapy as long she is on NSAIDs.  Patient also had breast and lung abnormalities seen on CT scan back in October. She had an 8 mm pulmonary nodule in the left lower lobe and a 2 cm mass in the parasternal lower inner quadrant of the right breast. Radiology recommended bilateral mammogram with right breast ultrasound and follow-up chest CT. I will for this information onto patient's PCP to arrange for necessary studies.

## 2016-12-12 NOTE — Patient Instructions (Signed)
1. Upper endoscopy and colonoscopy with Dr. Darrick Penna as scheduled. Please see separate instructions. 2. I will send a note to your PCP regarding need to follow-up on abnormalities on CT, i.e. breast and lung spots.

## 2016-12-12 NOTE — Progress Notes (Signed)
CC'ED TO PCP 

## 2016-12-15 NOTE — Progress Notes (Signed)
REVIEWED-NO ADDITIONAL RECOMMENDATIONS. 

## 2017-01-16 ENCOUNTER — Ambulatory Visit (HOSPITAL_COMMUNITY)
Admission: RE | Admit: 2017-01-16 | Discharge: 2017-01-16 | Disposition: A | Discharge status: Home / Self Care | Payer: Medicare HMO | Source: Ambulatory Visit | Attending: Gastroenterology | Admitting: Gastroenterology

## 2017-01-16 ENCOUNTER — Encounter (HOSPITAL_COMMUNITY)
Admission: RE | Disposition: A | Discharge status: Home / Self Care | Payer: Self-pay | Source: Ambulatory Visit | Attending: Gastroenterology

## 2017-01-16 ENCOUNTER — Encounter (HOSPITAL_COMMUNITY): Payer: Self-pay

## 2017-01-16 DIAGNOSIS — K644 Residual hemorrhoidal skin tags: Secondary | ICD-10-CM | POA: Insufficient documentation

## 2017-01-16 DIAGNOSIS — K449 Diaphragmatic hernia without obstruction or gangrene: Secondary | ICD-10-CM | POA: Diagnosis not present

## 2017-01-16 DIAGNOSIS — K648 Other hemorrhoids: Secondary | ICD-10-CM | POA: Insufficient documentation

## 2017-01-16 DIAGNOSIS — K219 Gastro-esophageal reflux disease without esophagitis: Secondary | ICD-10-CM | POA: Diagnosis not present

## 2017-01-16 DIAGNOSIS — Z87891 Personal history of nicotine dependence: Secondary | ICD-10-CM | POA: Diagnosis not present

## 2017-01-16 DIAGNOSIS — Z1212 Encounter for screening for malignant neoplasm of rectum: Secondary | ICD-10-CM

## 2017-01-16 DIAGNOSIS — Z79899 Other long term (current) drug therapy: Secondary | ICD-10-CM | POA: Insufficient documentation

## 2017-01-16 DIAGNOSIS — K222 Esophageal obstruction: Secondary | ICD-10-CM | POA: Insufficient documentation

## 2017-01-16 DIAGNOSIS — K2970 Gastritis, unspecified, without bleeding: Secondary | ICD-10-CM

## 2017-01-16 DIAGNOSIS — I10 Essential (primary) hypertension: Secondary | ICD-10-CM | POA: Insufficient documentation

## 2017-01-16 DIAGNOSIS — K631 Perforation of intestine (nontraumatic): Secondary | ICD-10-CM

## 2017-01-16 DIAGNOSIS — M1990 Unspecified osteoarthritis, unspecified site: Secondary | ICD-10-CM | POA: Diagnosis not present

## 2017-01-16 DIAGNOSIS — H409 Unspecified glaucoma: Secondary | ICD-10-CM | POA: Diagnosis not present

## 2017-01-16 DIAGNOSIS — Z1211 Encounter for screening for malignant neoplasm of colon: Secondary | ICD-10-CM

## 2017-01-16 DIAGNOSIS — K5720 Diverticulitis of large intestine with perforation and abscess without bleeding: Secondary | ICD-10-CM | POA: Insufficient documentation

## 2017-01-16 DIAGNOSIS — K2960 Other gastritis without bleeding: Secondary | ICD-10-CM | POA: Diagnosis not present

## 2017-01-16 HISTORY — PX: COLONOSCOPY: SHX5424

## 2017-01-16 HISTORY — PX: ESOPHAGOGASTRODUODENOSCOPY: SHX5428

## 2017-01-16 SURGERY — COLONOSCOPY
Anesthesia: Moderate Sedation

## 2017-01-16 MED ORDER — SODIUM CHLORIDE 0.9 % IV SOLN
INTRAVENOUS | Status: DC
  Administered 2017-01-16: 08:00:00 via INTRAVENOUS

## 2017-01-16 MED ORDER — LIDOCAINE VISCOUS 2 % MT SOLN
OROMUCOSAL | Status: DC | PRN
  Administered 2017-01-16: 1 via OROMUCOSAL

## 2017-01-16 MED ORDER — MIDAZOLAM HCL 5 MG/5ML IJ SOLN
INTRAMUSCULAR | Status: AC
  Filled 2017-01-16: qty 10

## 2017-01-16 MED ORDER — LIDOCAINE VISCOUS 2 % MT SOLN
OROMUCOSAL | Status: DC
Start: 2017-01-16 — End: 2017-01-16
  Filled 2017-01-16: qty 15

## 2017-01-16 MED ORDER — MEPERIDINE HCL 100 MG/ML IJ SOLN
INTRAMUSCULAR | Status: DC | PRN
  Administered 2017-01-16: 50 mg via INTRAVENOUS
  Administered 2017-01-16: 25 mg via INTRAVENOUS

## 2017-01-16 MED ORDER — MINERAL OIL PO OIL
TOPICAL_OIL | ORAL | Status: AC
  Filled 2017-01-16: qty 30

## 2017-01-16 MED ORDER — MEPERIDINE HCL 100 MG/ML IJ SOLN
INTRAMUSCULAR | Status: AC
  Filled 2017-01-16: qty 2

## 2017-01-16 MED ORDER — MIDAZOLAM HCL 5 MG/5ML IJ SOLN
INTRAMUSCULAR | Status: DC | PRN
  Administered 2017-01-16: 2 mg via INTRAVENOUS
  Administered 2017-01-16: 1 mg via INTRAVENOUS
  Administered 2017-01-16: 2 mg via INTRAVENOUS

## 2017-01-16 MED ORDER — STERILE WATER FOR IRRIGATION IR SOLN
Status: DC | PRN
  Administered 2017-01-16: 2.5 mL

## 2017-01-16 NOTE — Op Note (Signed)
[facility] Patient Name: Tiffany Bradley Procedure Date: 01/16/2017 9:14 AM MRN: [id number] Date of Birth: 12/13/1945 Attending MD: Jonette EvaSandi Jousha Schwandt , MD CSN: [id number] Age: [age] Admit Type: Outpatient Procedure:                Upper GI endoscopy WITH COLD FORCEPS BIOPSY Indications:              Dyspepsia, Esophageal reflux Providers:                Jonette EvaSandi Josuha Fontanez, MD, Nena PolioLisa Canche, RN, Dyann Ruddleonya Wilson Referring MD:             Remus LofflerAngel S. Jones Medicines:                TCS + Midazolam 1 mg IV Complications:            No immediate complications. Estimated Blood Loss:     Estimated blood loss was minimal. Procedure:                Pre-Anesthesia Assessment:                           - Prior to the procedure, a History and Physical                            was performed, and patient medications and                            allergies were reviewed. The patient's tolerance of                            previous anesthesia was also reviewed. The risks                            and benefits of the procedure and the sedation                            options and risks were discussed with the patient.                            All questions were answered, and informed consent                            was obtained. Prior Anticoagulants: The patient has                            taken previous NSAID medication, last dose was 1                            day prior to procedure. ASA Grade Assessment: II -                            A patient with mild systemic disease. After                            reviewing the risks and benefits, the patient was  deemed in satisfactory condition to undergo the                            procedure. After obtaining informed consent, the                            endoscope was passed under direct vision.                            Throughout the procedure, the patient's blood                            pressure, pulse, and oxygen  saturations were                            monitored continuously. The [id number] ([id number])                            scope was introduced through the mouth, and                            advanced to the second part of duodenum. The upper                            GI endoscopy was accomplished without difficulty.                            The patient tolerated the procedure well. Scope In: 9:19:38 AM Scope Out: 9:25:38 AM Total Procedure Duration: 0 hours 6 minutes 0 seconds  Findings:      A non-obstructing Schatzki ring (acquired) was found at the       gastroesophageal junction.      A small hiatal hernia was present.      Diffuse moderate inflammation characterized by congestion (edema),       erosions and erythema was found in the gastric antrum. Biopsies were       taken with a cold forceps for Helicobacter pylori testing. EVDENCE OF       PRIOR PUD IN ANTRUM.      The examined duodenum was normal. Impression:               - Non-obstructing Schatzki ring.                           - Small hiatal hernia.                           - DYSPEPSIA DUE TO GERD/EROSIVE GASTRITIS Moderate Sedation:      Moderate (conscious) sedation was administered by the endoscopy nurse       and supervised by the endoscopist. The following parameters were       monitored: oxygen saturation, heart rate, blood pressure, and response       to care. Total physician intraservice time was 36 minutes. Recommendation:           - Await pathology results.                           -  High fiber diet and low fat diet. AVOID REFLUX                            TRIGGERS.                           - Continue present medications. OMEPRAZOLE 30 MINS                            PRIOR TO FIRST MEAL FOREVER. ZANTAC IF NEEDED.                           - Return to my office in 4 months.                           - Patient has a contact number available for                            emergencies. The signs and symptoms  of potential                            delayed complications were discussed with the                            patient. Return to normal activities tomorrow.                            Written discharge instructions were provided to the                            patient. Procedure Code(s):        --- Professional ---                           [phone], Esophagogastroduodenoscopy, flexible,                            transoral; with biopsy, single or multiple                           99152, Moderate sedation services provided by the                            same physician or other qualified health care                            professional performing the diagnostic or                            therapeutic service that the sedation supports,                            requiring the presence of an independent trained  observer to assist in the monitoring of the                            patient's level of consciousness and physiological                            status; initial 15 minutes of intraservice time,                            patient age 19 years or older                           [phone], Moderate sedation services; each additional                            15 minutes intraservice time Diagnosis Code(s):        --- Professional ---                           K22.2, Esophageal obstruction                           K44.9, Diaphragmatic hernia without obstruction or                            gangrene                           K29.70, Gastritis, unspecified, without bleeding                           R10.13, Epigastric pain                           K21.9, Gastro-esophageal reflux disease without                            esophagitis CPT copyright 2016 American Medical Association. All rights reserved. The codes documented in this report are preliminary and upon coder review may  be revised to meet current compliance requirements. Jonette Xiomara,  MD Jonette Jisella, MD 01/16/2017 10:12:35 AM This report has been signed electronically. Number of Addenda: 0

## 2017-01-16 NOTE — Op Note (Signed)
[facility] Patient Name: Tiffany Bradley Procedure Date: 01/16/2017 7:28 AM MRN: [id number] Date of Birth: 1945/12/28 Attending MD: Jonette Ashlay , MD CSN: [id number] Age: 71 Admit Type: Outpatient Procedure:                Colonoscopy, SCREENING Indications:              Screening for colorectal malignant neoplasm Providers:                Jonette Allegra, MD, Nena Polio, RN, Dyann Ruddle Referring MD:             Pete Glatter Medicines:                Meperidine 75 mg IV, Midazolam 4 mg IV Complications:            No immediate complications. Estimated Blood Loss:     Estimated blood loss: none. Procedure:                Pre-Anesthesia Assessment:                           - Prior to the procedure, a History and Physical                            was performed, and patient medications and                            allergies were reviewed. The patient's tolerance of                            previous anesthesia was also reviewed. The risks                            and benefits of the procedure and the sedation                            options and risks were discussed with the patient.                            All questions were answered, and informed consent                            was obtained. Prior Anticoagulants: The patient has                            taken previous NSAID medication, last dose was 1                            day prior to procedure. ASA Grade Assessment: II -                            A patient with mild systemic disease. After                            reviewing the risks and benefits, the patient was  deemed in satisfactory condition to undergo the                            procedure. After obtaining informed consent, the                            colonoscope was passed under direct vision.                            Throughout the procedure, the patient's blood                            pressure, pulse, and oxygen  saturations were                            monitored continuously. The [id number] ([id number])                            scope was introduced through the anus and advanced                            to the the cecum, identified by appendiceal orifice                            and ileocecal valve. The colonoscopy was performed                            without difficulty. The patient tolerated the                            procedure well. The quality of the bowel                            preparation was good. The ileocecal valve,                            appendiceal orifice, and rectum were photographed. Scope In: 9:01:35 AM Scope Out: 9:12:01 AM Scope Withdrawal Time: 0 hours 8 minutes 19 seconds  Total Procedure Duration: 0 hours 10 minutes 26 seconds  Findings:      A single small-mouthed diverticulum was found in the mid transverse       colon.      The exam was otherwise without abnormality.      External and internal hemorrhoids were found during retroflexion. The       hemorrhoids were moderate. Impression:               - MILD Diverticulosis in the mid transverse colon.                           - SMALL External and internal hemorrhoids. Moderate Sedation:      Moderate (conscious) sedation was administered by the endoscopy nurse       and supervised by the endoscopist. The following parameters were       monitored: oxygen saturation, heart rate, blood pressure, and response  to care. Total physician intraservice time was 36 minutes. Recommendation:           - Repeat colonoscopy in 10 years for surveillance.                           - High fiber diet and low fat diet.                           - Continue present medications.                           - Patient has a contact number available for                            emergencies. The signs and symptoms of potential                            delayed complications were discussed with the                             patient. Return to normal activities tomorrow.                            Written discharge instructions were provided to the                            patient. Procedure Code(s):        --- Professional ---                           [phone], Colonoscopy, flexible; diagnostic, including                            collection of specimen(s) by brushing or washing,                            when performed (separate procedure)                           99152, Moderate sedation services provided by the                            same physician or other qualified health care                            professional performing the diagnostic or                            therapeutic service that the sedation supports,                            requiring the presence of an independent trained                            observer to assist in the monitoring of the  patient's level of consciousness and physiological                            status; initial 15 minutes of intraservice time,                            patient age 47 years or older                           [phone], Moderate sedation services; each additional                            15 minutes intraservice time Diagnosis Code(s):        --- Professional ---                           Z12.11, Encounter for screening for malignant                            neoplasm of colon                           K64.8, Other hemorrhoids                           K57.30, Diverticulosis of large intestine without                            perforation or abscess without bleeding CPT copyright 2016 American Medical Association. All rights reserved. The codes documented in this report are preliminary and upon coder review may  be revised to meet current compliance requirements. Jonette EvaSandi Bambi Fehnel, MD Jonette EvaSandi Niya Behler, MD 01/16/2017 9:42:47 AM This report has been signed electronically. Number of Addenda: 0

## 2017-01-16 NOTE — H&P (Signed)
Primary Care Physician:  Remus LofflerJones, Angel S, PA-C Primary Gastroenterologist:  Dr. Darrick PennaFields  Pre-Procedure History & Physical: HPI:  Tiffany Bradley is a 71 y.o. female here for  dyspepsia & screening.  Past Medical History:  Diagnosis Date  . Arthritis   . Back pain   . Cataract   . DJD (degenerative joint disease)   . Full dentures   . GERD (gastroesophageal reflux disease)   . Glaucoma   . History of bleeding ulcers    patient reports multiple hospitalizations remotely  . Hypertension   . Wears glasses     Past Surgical History:  Procedure Laterality Date  . BREAST REDUCTION SURGERY Bilateral 04/25/2013   Procedure: MAMMARY REDUCTION  (BREAST);  Surgeon: Louisa SecondGerald Truesdale, MD;  Location: [facility];  Service: Plastics;  Laterality: Bilateral;  . COLONOSCOPY     2008, normal per patient  . COSMETIC SURGERY    . PARTIAL COLECTOMY N/A 06/29/2016   Procedure: PARTIAL COLECTOMY;  Surgeon: Ancil LinseyJason Evan Davis, MD;  Location: [facility];  Service: General;  Laterality: N/A;  . TUBAL LIGATION      Prior to Admission medications   Medication Sig Start Date End Date Taking? Authorizing Provider  amLODipine (NORVASC) 10 MG tablet Take 1 tablet (10 mg total) by mouth daily. 10/29/16  Yes Remus LofflerJones, Angel S, PA-C  diclofenac (VOLTAREN) 75 MG EC tablet Take 75 mg by mouth 2 (two) times daily.   Yes [provider]  diphenhydrAMINE (BENADRYL) 25 MG tablet Take 25 mg by mouth at bedtime as needed for allergies.   Yes [provider]  Glucosamine HCl (GLUCOSAMINE PO) Take 2 tablets by mouth daily.   Yes [provider]  Multiple Vitamins-Minerals (MULTI-VITAMIN GUMMIES PO) Take 2 each by mouth daily.    Yes [provider]  omeprazole (PRILOSEC) 40 MG capsule Take 1 capsule (40 mg total) by mouth daily. 11/14/16  Yes Remus LofflerJones, Angel S, PA-C  polyethylene glycol-electrolytes (TRILYTE) 420 g solution Take 4,000 mLs by mouth as directed. 12/12/16  Yes Juli Odom L, MD   tiZANidine (ZANAFLEX) 4 MG tablet Take 1 tablet (4 mg total) by mouth every 8 (eight) hours as needed for muscle spasms. Patient taking differently: Take 4 mg by mouth at bedtime as needed for muscle spasms.  10/29/16  Yes Remus LofflerJones, Angel S, PA-C  sucralfate (CARAFATE) 1 g tablet Take 1 tablet (1 g total) by mouth 4 (four) times daily -  with meals and at bedtime. Patient not taking: Reported on 01/14/2017 11/19/16   Remus LofflerJones, Angel S, PA-C    Allergies as of 12/12/2016  . (No Known Allergies)    Family History  Problem Relation Age of Onset  . Kidney disease Mother   . Stroke Mother   . Heart disease Father   . Colon cancer Neg Hx     Social History   Social History  . Marital status: Divorced    Spouse name: N/A  . Number of children: N/A  . Years of education: N/A   Occupational History  . Not on file.   Social History Main Topics  . Smoking status: Former Smoker    Types: Cigarettes    Quit date: 04/20/1993  . Smokeless tobacco: Never Used  . Alcohol use Yes     Comment: rare  . Drug use: No  . Sexual activity: Not Currently   Other Topics Concern  . Not on file   Social History Narrative  . No narrative on file  Review of Systems: See HPI, otherwise negative ROS   Physical Exam: BP 121/79   Pulse 85   Temp 97.6 F (36.4 C)   Resp 14   Ht 5\' 5"  (1.651 m)   Wt 167 lb (75.8 kg)   SpO2 99%   BMI 27.79 kg/m  General:   Alert,  pleasant and cooperative in NAD Head:  Normocephalic and atraumatic. Neck:  Supple; Lungs:  Clear throughout to auscultation.    Heart:  Regular rate and rhythm. Abdomen:  Soft, nontender and nondistended. Normal bowel sounds, without guarding, and without rebound.   Neurologic:  Alert and  oriented x4;  grossly normal neurologically.  Impression/Plan:    SCREENING/dyspepsia  Plan:  1. TCS/EGD TODAY. DISCUSSED PROCEDURE, BENEFITS, & RISKS: < 1% chance of medication reaction, bleeding, perforation, or rupture of  spleen/liver.

## 2017-01-16 NOTE — Discharge Instructions (Signed)
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  BOTH OF THESE COME FROM DICLOFENAC, ASPIRIN, BC/GOODY POWDERS, IBUPROFEN/MOTRIN, AND NAPROXEN/ALEVE. 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  YOU DID NOT HAVE ANY POLYPS. You have INTERNAL AND EXTERNAL hemorrhoids. 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   I BIOPSIED YOUR STOMACH.    HOLD DICLOFENAC FOR 7 DAYS.  DRINK WATER TO KEEP YOUR URINE LIGHT YELLOW.   FOLLOW A LOW FAT/HIGH FIBER DIET. AVOID ITEMS THAT CAUSE BLOATING. MEATS SHOULD BE BAKED, BROILED, OR BOILED. AVOID FRIED FOODS.  AVOID REFLUX TRIGGERS. SEE INFO BELOW.  CONTINUE OMEPRAZOLE.  TAKE 30 MINUTES PRIOR TO YOUR FIRST MEAL FOREVER.  PEPCID OR ZANTAC HELP WHEN USED IF NEEDED FOR UPPER ABDOMINAL PAIN OR HEARTBURN.  YOUR BIOPSY RESULTS WILL BE AVAILABLE IN MY CHART MAY 23  AND MY OFFICE WILL CONTACT YOU IN 10-14 DAYS WITH YOUR RESULTS.   FOLLOW UP IN 4 MOS.  NEXT COLONOSCOPY IN 10 YEARS.   ENDOSCOPY Care After Read the instructions outlined below and refer to this sheet in the next week. These discharge instructions provide you with general information on caring for yourself after you leave the hospital. While your treatment has been planned according to the most current medical practices available, unavoidable complications occasionally occur. If you have any problems or questions after discharge, call DR. Patton Rabinovich, [phone].  ACTIVITY  You may resume your regular activity, but move at a slower pace for the next 24 hours.   Take frequent rest periods for the next 24 hours.   Walking will help get rid of the air and reduce the bloated feeling in your belly (abdomen).   No driving for 24 hours (because of the medicine (anesthesia) used during the test).   You may shower.   Do not sign any important legal documents or operate any machinery for 24 hours (because of the anesthesia used during the  test).    NUTRITION  Drink plenty of fluids.   You may resume your normal diet as instructed by your doctor.   Begin with a light meal and progress to your normal diet. Heavy or fried foods are harder to digest and may make you feel sick to your stomach (nauseated).   Avoid alcoholic beverages for 24 hours or as instructed.    MEDICATIONS  You may resume your normal medications.   WHAT YOU CAN EXPECT TODAY  Some feelings of bloating in the abdomen.   Passage of more gas than usual.   Spotting of blood in your stool or on the toilet paper  .  IF YOU HAD POLYPS REMOVED DURING THE ENDOSCOPY:  Eat a soft diet IF YOU HAVE NAUSEA, BLOATING, ABDOMINAL PAIN, OR VOMITING.    FINDING OUT THE RESULTS OF YOUR TEST Not all test results are available during your visit. DR. Darrick Penna WILL CALL YOU WITHIN 14 DAYS OF YOUR PROCEDUE WITH YOUR RESULTS. Do not assume everything is normal if you have not heard from DR. Zhi Geier, CALL HER OFFICE AT [phone].  SEEK IMMEDIATE MEDICAL ATTENTION AND CALL THE OFFICE: [phone] IF:  You have more than a spotting of blood in your stool.   Your belly is swollen (abdominal distention).   You are nauseated or vomiting.   You have a temperature over 101F.   You have abdominal pain or discomfort that is severe or gets worse throughout the day.    Lifestyle and  home remedies TO CONTROL REFLUX/HEARTBURN You may eliminate or reduce the frequency of heartburn by making the following lifestyle changes:   Control your weight. Being overweight is a major risk factor for heartburn and GERD. Excess pounds put pressure on your abdomen, pushing up your stomach and causing acid to back up into your esophagus.    Eat smaller meals. 4 TO 6 MEALS A DAY. This reduces pressure on the lower esophageal sphincter, helping to prevent the valve from opening and acid from washing back into your esophagus.    Loosen your belt. Clothes that fit tightly around your  waist put pressure on your abdomen and the lower esophageal sphincter.     Eliminate heartburn triggers. Everyone has specific triggers.Common triggers such as fatty or fried foods, spicy food, tomato sauce, carbonated beverages, alcohol, chocolate, mint, garlic, onion, caffeine and nicotine may make heartburn worse.    Avoid stooping or bending. Tying your shoes is OK. Bending over for longer periods to weed your garden isn't, especially soon after eating.    Don't lie down after a meal. Wait at least three to four hours after eating before going to bed, and don't lie down right after eating.   Alternative medicine  Several home remedies exist for treating GERD, but they provide only temporary relief. They include drinking baking soda (sodium bicarbonate) added to water or drinking other fluids such as baking soda mixed with cream of tartar and water.  Although these liquids create temporary relief by neutralizing, washing away or buffering acids, eventually they aggravate the situation by adding gas and fluid to your stomach, increasing pressure and causing more acid reflux. Further, adding more sodium to your diet may increase your blood pressure and add stress to your heart, and excessive bicarbonate ingestion can alter the acid-base balance in your body.    Gastritis  Gastritis is an inflammation (the body's way of reacting to injury and/or infection) of the stomach. It is often caused by bacterial (germ) infections. It can also be caused BY ASPIRIN, BC/GOODY POWDER'S, (IBUPROFEN) MOTRIN, OR ALEVE (NAPROXEN), chemicals (including alcohol), SPICY FOODS, and medications. This illness may be associated with generalized malaise (feeling tired, not well), UPPER ABDOMINAL STOMACH cramps, and fever. One common bacterial cause of gastritis is an organism known as H. Pylori. This can be treated with antibiotics.    High-Fiber Diet A high-fiber diet changes your normal diet to include more whole  grains, legumes, fruits, and vegetables. Changes in the diet involve replacing refined carbohydrates with unrefined foods. The calorie level of the diet is essentially unchanged. The Dietary Reference Intake (recommended amount) for adult males is 38 grams per day. For adult females, it is 25 grams per day. Pregnant and lactating women should consume 28 grams of fiber per day. Fiber is the intact part of a plant that is not broken down during digestion. Functional fiber is fiber that has been isolated from the plant to provide a beneficial effect in the body. PURPOSE  Increase stool bulk.   Ease and regulate bowel movements.   Lower cholesterol.  REDUCE RISK OF COLON CANCER  INDICATIONS THAT YOU NEED MORE FIBER  Constipation and hemorrhoids.   Uncomplicated diverticulosis (intestine condition) and irritable bowel syndrome.   Weight management.   As a protective measure against hardening of the arteries (atherosclerosis), diabetes, and cancer.   GUIDELINES FOR INCREASING FIBER IN THE DIET  Start adding fiber to the diet slowly. A gradual increase of about 5 more grams (2 slices  of whole-wheat bread, 2 servings of most fruits or vegetables, or 1 bowl of high-fiber cereal) per day is best. Too rapid an increase in fiber may result in constipation, flatulence, and bloating.   Drink enough water and fluids to keep your urine clear or pale yellow. Water, juice, or caffeine-free drinks are recommended. Not drinking enough fluid may cause constipation.   Eat a variety of high-fiber foods rather than one type of fiber.   Try to increase your intake of fiber through using high-fiber foods rather than fiber pills or supplements that contain small amounts of fiber.   The goal is to change the types of food eaten. Do not supplement your present diet with high-fiber foods, but replace foods in your present diet.    INCLUDE A VARIETY OF FIBER SOURCES  Replace refined and processed grains with  whole grains, canned fruits with fresh fruits, and incorporate other fiber sources. White rice, white breads, and most bakery goods contain little or no fiber.   Brown whole-grain rice, buckwheat oats, and many fruits and vegetables are all good sources of fiber. These include: broccoli, Brussels sprouts, cabbage, cauliflower, beets, sweet potatoes, white potatoes (skin on), carrots, tomatoes, eggplant, squash, berries, fresh fruits, and dried fruits.   Cereals appear to be the richest source of fiber. Cereal fiber is found in whole grains and bran. Bran is the fiber-rich outer coat of cereal grain, which is largely removed in refining. In whole-grain cereals, the bran remains. In breakfast cereals, the largest amount of fiber is found in those with "bran" in their names. The fiber content is sometimes indicated on the label.   You may need to include additional fruits and vegetables each day.   In baking, for 1 cup white flour, you may use the following substitutions:   1 cup whole-wheat flour minus 2 tablespoons.   1/2 cup white flour plus 1/2 cup whole-wheat flour.   Diverticulosis Diverticulosis is a common condition that develops when small pouches (diverticula) form in the wall of the colon OR SMALL BOWEL. The risk of diverticulosis increases with age. It happens more often in people who eat a low-fiber diet. Most individuals with diverticulosis have no symptoms. Those individuals with symptoms usually experience belly (abdominal) pain, constipation, or loose stools (diarrhea).  HOME CARE INSTRUCTIONS Increase the amount of fiber in your diet as directed by your caregiver or dietician. This may reduce symptoms of diverticulosis.  Drink at least 6 to 8 glasses of water each day to prevent constipation.  Try not to strain when you have a bowel movement.  Avoiding nuts and seeds to prevent complications is NOT NECESSARY.   FOODS HAVING HIGH FIBER CONTENT INCLUDE: Fruits. Apple, peach,  pear, tangerine, raisins, prunes.  Vegetables. Brussels sprouts, asparagus, broccoli, cabbage, carrot, cauliflower, romaine lettuce, spinach, summer squash, tomato, winter squash, zucchini.  Starchy Vegetables. Baked beans, kidney beans, lima beans, split peas, lentils, potatoes (with skin).  Grains. Whole wheat bread, brown rice, bran flake cereal, plain oatmeal, white rice, shredded wheat, bran muffins.   SEEK IMMEDIATE MEDICAL CARE IF: You develop increasing pain or severe bloating.  You have an oral temperature above 101F.  You develop vomiting or bowel movements that are bloody or black.

## 2017-01-21 ENCOUNTER — Telehealth: Payer: Self-pay | Admitting: Gastroenterology

## 2017-01-22 ENCOUNTER — Encounter (HOSPITAL_COMMUNITY): Payer: Self-pay | Admitting: Gastroenterology

## 2017-01-27 NOTE — Telephone Encounter (Signed)
Tried to call pt. Someone answered and said she was unable to take call. Left message for her to call office.

## 2017-01-27 NOTE — Telephone Encounter (Signed)
Please call pt. His stomach Bx shows gastritis DUE TO DICLOFENAC.     HOLD DICLOFENAC. RE-START  MAY 26.  DRINK WATER TO KEEP YOUR URINE LIGHT YELLOW.   FOLLOW A LOW FAT/HIGH FIBER DIET. AVOID ITEMS THAT CAUSE BLOATING. MEATS SHOULD BE BAKED, BROILED, OR BOILED. AVOID FRIED FOODS.  AVOID REFLUX TRIGGERS.   CONTINUE OMEPRAZOLE.  TAKE 30 MINUTES PRIOR TO YOUR FIRST MEAL FOREVER.  PEPCID OR ZANTAC HELP WHEN USED IF NEEDED FOR UPPER ABDOMINAL PAIN OR HEARTBURN.  FOLLOW UP IN 4 MOS E30 GERD/GASTRITIS.  NEXT COLONOSCOPY IN 10 YEARS.

## 2017-01-27 NOTE — Telephone Encounter (Signed)
Pt called office and was informed. 

## 2017-01-28 DIAGNOSIS — M170 Bilateral primary osteoarthritis of knee: Secondary | ICD-10-CM | POA: Diagnosis not present

## 2017-01-28 DIAGNOSIS — M25561 Pain in right knee: Secondary | ICD-10-CM | POA: Diagnosis not present

## 2017-01-28 DIAGNOSIS — M25562 Pain in left knee: Secondary | ICD-10-CM | POA: Diagnosis not present

## 2017-01-28 NOTE — Telephone Encounter (Signed)
Reminder in epic °

## 2017-01-29 ENCOUNTER — Telehealth: Payer: Self-pay | Admitting: Physician Assistant

## 2017-02-03 ENCOUNTER — Telehealth: Payer: Self-pay | Admitting: Physician Assistant

## 2017-02-06 ENCOUNTER — Telehealth: Payer: Self-pay | Admitting: Physician Assistant

## 2017-02-06 NOTE — Telephone Encounter (Signed)
Aware to ask her Orthopedic doctor if surgery can be done in [place].

## 2017-03-19 ENCOUNTER — Telehealth: Payer: Self-pay | Admitting: Physician Assistant

## 2017-04-16 DIAGNOSIS — M170 Bilateral primary osteoarthritis of knee: Secondary | ICD-10-CM | POA: Diagnosis not present

## 2017-04-16 DIAGNOSIS — M25562 Pain in left knee: Secondary | ICD-10-CM | POA: Diagnosis not present

## 2017-04-16 DIAGNOSIS — M25561 Pain in right knee: Secondary | ICD-10-CM | POA: Diagnosis not present

## 2017-04-28 ENCOUNTER — Other Ambulatory Visit: Payer: Medicare HMO

## 2017-04-28 ENCOUNTER — Encounter: Payer: Self-pay | Admitting: Physician Assistant

## 2017-04-28 ENCOUNTER — Ambulatory Visit (INDEPENDENT_AMBULATORY_CARE_PROVIDER_SITE_OTHER): Payer: Medicare HMO | Admitting: Physician Assistant

## 2017-04-28 ENCOUNTER — Ambulatory Visit: Payer: Medicare HMO | Admitting: Physician Assistant

## 2017-04-28 VITALS — BP 113/76 | HR 83 | Temp 98.2°F | Ht 65.0 in | Wt 170.6 lb

## 2017-04-28 DIAGNOSIS — K219 Gastro-esophageal reflux disease without esophagitis: Secondary | ICD-10-CM

## 2017-04-28 DIAGNOSIS — M8580 Other specified disorders of bone density and structure, unspecified site: Secondary | ICD-10-CM | POA: Diagnosis not present

## 2017-04-28 DIAGNOSIS — D509 Iron deficiency anemia, unspecified: Secondary | ICD-10-CM | POA: Diagnosis not present

## 2017-04-28 DIAGNOSIS — E559 Vitamin D deficiency, unspecified: Secondary | ICD-10-CM | POA: Diagnosis not present

## 2017-04-28 DIAGNOSIS — M8588 Other specified disorders of bone density and structure, other site: Secondary | ICD-10-CM | POA: Insufficient documentation

## 2017-04-28 DIAGNOSIS — M170 Bilateral primary osteoarthritis of knee: Secondary | ICD-10-CM | POA: Diagnosis not present

## 2017-04-28 DIAGNOSIS — I10 Essential (primary) hypertension: Secondary | ICD-10-CM

## 2017-04-28 NOTE — Progress Notes (Signed)
BP 113/76   Pulse 83   Temp 98.2 F (36.8 C) (Oral)   Ht _0  (1.651 m)   Wt 170 lb 9.6 oz (77.4 kg)   BMI 28.39 kg/m    Subjective:    Patient ID: Tiffany Bradley, female    DOB: 12-27-1945, 71 y.o.   MRN: [id number]  HPI: Tiffany Bradley is a 71 y.o. female presenting on 04/28/2017 for Follow-up (6 wk)  This patient comes in for periodic recheck on medications and conditions including Hypertension, current, osteoarthritis, iron deficiency anemia, vitamin D, osteopenia. We have discussed the need for a DEXA. She is agreeable to have a son. She cannot afford to have a few other things done right now. She is planning on knee replacement in the spring of 2019. Her orthopedist is Dr. Case. All of her medications are reviewed. Physical.   All medications are reviewed today. There are no reports of any problems with the medications. All of the medical conditions are reviewed and updated.  Lab work is reviewed and will be ordered as medically necessary. There are no new problems reported with today's visit.   Relevant past medical, surgical, family and social history reviewed and updated as indicated. Allergies and medications reviewed and updated.  Past Medical History:  Diagnosis Date  . Arthritis   . Back pain   . Cataract   . DJD (degenerative joint disease)   . Full dentures   . GERD (gastroesophageal reflux disease)   . Glaucoma   . History of bleeding ulcers    patient reports multiple hospitalizations remotely  . Hypertension   . Wears glasses     Past Surgical History:  Procedure Laterality Date  . BREAST REDUCTION SURGERY Bilateral 04/25/2013   Procedure: MAMMARY REDUCTION  (BREAST);  Surgeon: Cristine Polio, MD;  Location: [facility];  Service: Plastics;  Laterality: Bilateral;  . COLONOSCOPY     2008, normal per patient  . COLONOSCOPY N/A 01/16/2017   Procedure: COLONOSCOPY;  Surgeon: Danie Binder, MD;  [facility];  Service: Endoscopy;   Laterality: N/A;  830   . COSMETIC SURGERY    . ESOPHAGOGASTRODUODENOSCOPY N/A 01/16/2017   Procedure: ESOPHAGOGASTRODUODENOSCOPY (EGD);  Surgeon: Danie Binder, MD;  [facility];  Service: Endoscopy;  Laterality: N/A;  . PARTIAL COLECTOMY N/A 06/29/2016   Procedure: PARTIAL COLECTOMY;  Surgeon: Vickie Epley, MD;  [facility];  Service: General;  Laterality: N/A;  . TUBAL LIGATION      Review of Systems  Constitutional: Negative.  Negative for activity change, fatigue and fever.  HENT: Negative.   Eyes: Negative.   Respiratory: Negative.  Negative for cough.   Cardiovascular: Negative.  Negative for chest pain.  Gastrointestinal: Negative.  Negative for abdominal pain.  Endocrine: Negative.   Genitourinary: Negative.  Negative for dysuria.  Musculoskeletal: Positive for arthralgias and joint swelling.  Skin: Negative.   Neurological: Negative.     Allergies as of 04/28/2017   No Known Allergies     Medication List       Accurate as of 04/28/17  9:29 AM. Always use your most recent med list.          amLODipine 10 MG tablet Commonly known as:  NORVASC Take 1 tablet (10 mg total) by mouth daily.   diclofenac 75 MG EC tablet Commonly known as:  VOLTAREN Take 75 mg by mouth 2 (two) times daily.   GLUCOSAMINE PO Take 2 tablets by mouth  daily.   MULTI-VITAMIN GUMMIES PO Take 2 each by mouth daily.   omeprazole 40 MG capsule Commonly known as:  PRILOSEC Take 1 capsule (40 mg total) by mouth daily.   sucralfate 1 g tablet Commonly known as:  CARAFATE Take 1 g by mouth 4 (four) times daily -  with meals and at bedtime.   tiZANidine 4 MG tablet Commonly known as:  ZANAFLEX Take 1 tablet (4 mg total) by mouth every 8 (eight) hours as needed for muscle spasms.            Discharge Care Instructions        Start     Ordered   04/28/17 0000  VITAMIN D 25 Hydroxy (Vit-D Deficiency, Fractures)     04/28/17 0920   04/28/17 0000  CBC with  Differential/Platelet     04/28/17 0920   04/28/17 0000  Iron     04/28/17 0920   04/28/17 0000  DG [facility] DEXA    Question:  Reason for Exam (SYMPTOM  OR DIAGNOSIS REQUIRED)  Answer:  osteopenia   04/28/17 0928         Objective:    BP 113/76   Pulse 83   Temp 98.2 F (36.8 C) (Oral)   Ht '5\' 5"'$  (1.651 m)   Wt 170 lb 9.6 oz (77.4 kg)   BMI 28.39 kg/m   No Known Allergies  Physical Exam  Constitutional: She is oriented to person, place, and time. She appears well-developed and well-nourished.  HENT:  Head: Normocephalic and atraumatic.  Right Ear: Tympanic membrane, external ear and ear canal normal.  Left Ear: Tympanic membrane, external ear and ear canal normal.  Nose: Nose normal. No rhinorrhea.  Mouth/Throat: Oropharynx is clear and moist and mucous membranes are normal. No oropharyngeal exudate or posterior oropharyngeal erythema.  Eyes: Pupils are equal, round, and reactive to light. Conjunctivae and EOM are normal.  Neck: Normal range of motion. Neck supple.  Cardiovascular: Normal rate, regular rhythm, normal heart sounds and intact distal pulses.   Pulmonary/Chest: Effort normal and breath sounds normal.  Abdominal: Soft. Bowel sounds are normal.  Musculoskeletal:       Left knee: She exhibits decreased range of motion and swelling.  Neurological: She is alert and oriented to person, place, and time. She has normal reflexes.  Skin: Skin is warm and dry. No rash noted.  Psychiatric: She has a normal mood and affect. Her behavior is normal. Judgment and thought content normal.  Nursing note and vitals reviewed.   Results for orders placed or performed in visit on 10/29/16  CMP14+EGFR  Result Value Ref Range   Glucose 72 65 - 99 mg/dL   BUN 22 8 - 27 mg/dL   Creatinine, Ser 0.57 0.57 - 1.00 mg/dL   GFR calc non Af Amer 95 >59 mL/min/1.73   GFR calc Af Amer 109 >59 mL/min/1.73   BUN/Creatinine Ratio 39 (H) 12 - 28   Sodium 141 134 - 144 mmol/L   Potassium 4.4  3.5 - 5.2 mmol/L   Chloride 102 96 - 106 mmol/L   CO2 21 18 - 29 mmol/L   Calcium 9.2 8.7 - 10.3 mg/dL   Total Protein 6.8 6.0 - 8.5 g/dL   Albumin 4.3 3.6 - 4.8 g/dL   Globulin, Total 2.5 1.5 - 4.5 g/dL   Albumin/Globulin Ratio 1.7 1.2 - 2.2   Bilirubin Total 0.4 0.0 - 1.2 mg/dL   Alkaline Phosphatase 111 39 - 117 IU/L   AST  27 0 - 40 IU/L   ALT 17 0 - 32 IU/L  CBC with Differential  Result Value Ref Range   WBC 4.2 3.4 - 10.8 x10E3/uL   RBC 4.22 3.77 - 5.28 x10E6/uL   Hemoglobin 10.6 (L) 11.1 - 15.9 g/dL   Hematocrit 34.2 34.0 - 46.6 %   MCV 81 79 - 97 fL   MCH 25.1 (L) 26.6 - 33.0 pg   MCHC 31.0 (L) 31.5 - 35.7 g/dL   RDW 17.4 (H) 12.3 - 15.4 %   Platelets 307 150 - 379 x10E3/uL   Neutrophils 59 Not Estab. %   Lymphs 27 Not Estab. %   Monocytes 7 Not Estab. %   Eos 6 Not Estab. %   Basos 1 Not Estab. %   Neutrophils Absolute 2.4 1.4 - 7.0 x10E3/uL   Lymphocytes Absolute 1.2 0.7 - 3.1 x10E3/uL   Monocytes Absolute 0.3 0.1 - 0.9 x10E3/uL   EOS (ABSOLUTE) 0.2 0.0 - 0.4 x10E3/uL   Basophils Absolute 0.1 0.0 - 0.2 x10E3/uL   Immature Granulocytes 0 Not Estab. %   Immature Grans (Abs) 0.0 0.0 - 0.1 x10E3/uL  Lipid panel  Result Value Ref Range   Cholesterol, Total 184 100 - 199 mg/dL   Triglycerides 35 0 - 149 mg/dL   HDL 65 >39 mg/dL   VLDL Cholesterol Cal 7 5 - 40 mg/dL   LDL Calculated 112 (H) 0 - 99 mg/dL   Chol/HDL Ratio 2.8 0.0 - 4.4 ratio units  TSH  Result Value Ref Range   TSH 1.610 0.450 - 4.500 uIU/mL      Assessment & Plan:   1. Essential hypertension  2. Gastroesophageal reflux disease without esophagitis - sucralfate (CARAFATE) 1 g tablet; Take 1 g by mouth 4 (four) times daily -  with meals and at bedtime.   3. Primary osteoarthritis of both knees - diclofenac (VOLTAREN) 75 MG EC tablet; Take 75 mg by mouth 2 (two) times daily.   4. Iron deficiency anemia, unspecified iron deficiency anemia type - VITAMIN D 25 Hydroxy (Vit-D Deficiency,  Fractures) - CBC with Differential/Platelet - Iron  5. Vitamin D deficiency - VITAMIN D 25 Hydroxy (Vit-D Deficiency, Fractures) - DG [facility] DEXA  6. Osteopenia, unspecified location - DG [facility] DEXA    Current Outpatient Prescriptions:  .  amLODipine (NORVASC) 10 MG tablet, Take 1 tablet (10 mg total) by mouth daily., Disp: 30 tablet, Rfl: 11 .  diclofenac (VOLTAREN) 75 MG EC tablet, Take 75 mg by mouth 2 (two) times daily. , Disp: , Rfl:  .  Glucosamine HCl (GLUCOSAMINE PO), Take 2 tablets by mouth daily., Disp: , Rfl:  .  Multiple Vitamins-Minerals (MULTI-VITAMIN GUMMIES PO), Take 2 each by mouth daily. , Disp: , Rfl:  .  omeprazole (PRILOSEC) 40 MG capsule, Take 1 capsule (40 mg total) by mouth daily., Disp: 30 capsule, Rfl: 11 .  sucralfate (CARAFATE) 1 g tablet, Take 1 g by mouth 4 (four) times daily -  with meals and at bedtime. , Disp: , Rfl:  .  tiZANidine (ZANAFLEX) 4 MG tablet, Take 1 tablet (4 mg total) by mouth every 8 (eight) hours as needed for muscle spasms. (Patient taking differently: Take 4 mg by mouth at bedtime as needed for muscle spasms. ), Disp: 60 tablet, Rfl: 11 Continue all other maintenance medications as listed above.  Follow up plan: No Follow-up on file.  Educational handout given for Mer[place] PA-C [address], [place] [address] [phone]   04/28/2017, 9:29 AM

## 2017-04-28 NOTE — Patient Instructions (Signed)
In a few days you may receive a survey in the mail or online from [employer] regarding your visit with us today. Please take a moment to fill this out. Your feedback is very important to our whole office. It can help us better understand your needs as well as improve your experience and satisfaction. Thank you for taking your time to complete it. We care about you.  Tisa Weisel, PA-C  

## 2017-04-29 LAB — CBC WITH DIFFERENTIAL/PLATELET
BASOS: 2 %
Basophils Absolute: 0.1 10*3/uL (ref 0.0–0.2)
EOS (ABSOLUTE): 0.3 10*3/uL (ref 0.0–0.4)
Eos: 7 %
HEMATOCRIT: 34.8 % (ref 34.0–46.6)
Hemoglobin: 10.7 g/dL — ABNORMAL LOW (ref 11.1–15.9)
IMMATURE GRANS (ABS): 0 10*3/uL (ref 0.0–0.1)
IMMATURE GRANULOCYTES: 0 %
LYMPHS: 28 %
Lymphocytes Absolute: 1.3 10*3/uL (ref 0.7–3.1)
MCH: 26 pg — ABNORMAL LOW (ref 26.6–33.0)
MCHC: 30.7 g/dL — ABNORMAL LOW (ref 31.5–35.7)
MCV: 85 fL (ref 79–97)
Monocytes Absolute: 0.3 10*3/uL (ref 0.1–0.9)
Monocytes: 6 %
NEUTROS PCT: 57 %
Neutrophils Absolute: 2.5 10*3/uL (ref 1.4–7.0)
Platelets: 278 10*3/uL (ref 150–379)
RBC: 4.11 x10E6/uL (ref 3.77–5.28)
RDW: 16.5 % — ABNORMAL HIGH (ref 12.3–15.4)
WBC: 4.4 10*3/uL (ref 3.4–10.8)

## 2017-04-29 LAB — IRON: Iron: 68 ug/dL (ref 27–139)

## 2017-04-29 LAB — VITAMIN D 25 HYDROXY (VIT D DEFICIENCY, FRACTURES): Vit D, 25-Hydroxy: 32.9 ng/mL (ref 30.0–100.0)

## 2017-05-20 ENCOUNTER — Ambulatory Visit: Payer: Medicare HMO | Admitting: Nurse Practitioner

## 2017-10-15 ENCOUNTER — Ambulatory Visit (INDEPENDENT_AMBULATORY_CARE_PROVIDER_SITE_OTHER): Payer: Medicare HMO | Admitting: *Deleted

## 2017-10-15 VITALS — BP 137/83 | HR 82 | Ht 65.0 in | Wt 174.0 lb

## 2017-10-15 DIAGNOSIS — Z0000 Encounter for general adult medical examination without abnormal findings: Secondary | ICD-10-CM

## 2017-10-15 NOTE — Patient Instructions (Signed)
  Ms. Tiffany Bradley , Thank you for taking time to come for your Medicare Wellness Visit. I appreciate your ongoing commitment to your health goals. Please review the following plan we discussed and let me know if I can assist you in the future.   These are the goals we discussed: Goals    . Exercise 3x per week (30 min per time)       This is a list of the screening recommended for you and due dates:  Health Maintenance  Topic Date Due  .  Hepatitis C: One time screening is recommended by Center for Disease Control  (CDC) for  adults born from [birth year] through 1964.   11/21/1945  . Tetanus Vaccine  02/07/1966  . Mammogram  02/07/1997  . DEXA scan (bone density measurement)  02/08/2012  . Pneumonia vaccines (1 of 2 - PCV13) 02/08/2012  . Flu Shot  06/09/2018*  . Colon Cancer Screening  01/17/2027  *Topic was postponed. The date shown is not the original due date.

## 2017-10-15 NOTE — Progress Notes (Signed)
Subjective:   Tiffany Bradley is a 72 y.o. female who presents for an Initial Medicare Annual Wellness Visit. Patient is here today for her initial medicare annual wellness visit. Patient retired from [employer] where she worked for 21 years. Patient enjoys spending time with her family especially her grandchildren. She enjoys walking and making crafts. Patient does exercise some by walking but not as often due to knee pain. She states her diet is semi-healthy. Patient is involved in her church she sings in the choir and is over the Architectural technologist. She lives in [place] with her daughter and 2 grandchildren. Patient has 3 adult children. Patient has 3 stairs in the home, no small pets or rugs in the home. Fall hazards were discussed with patient. Patient states that she feels like her health is the same other than her knee pain has increased. Patient has not had any hospitalizations or surgeries in the last year.   Review of Systems      Cardiac Risk Factors include: advanced age (>40men, >18 women);hypertension     Objective:    Today's Vitals   10/15/17 1603  BP: 137/83  Pulse: 82  Weight: 174 lb (78.9 kg)  Height: 5\' 5"  (1.651 m)   Body mass index is 28.96 kg/m.  Advanced Directives 10/15/2017 01/16/2017 06/30/2016 06/29/2016 04/25/2013 04/20/2013  Does Patient Have a Medical Advance Directive? No No No No Patient does not have advance directive Patient does not have advance directive  Would patient like information on creating a medical advance directive? Yes (MAU/Ambulatory/Procedural Areas - Information given) No - Patient declined No - patient declined information No - patient declined information - -  Pre-existing out of facility DNR order (yellow form or pink MOST form) - - - - No -    Current Medications (verified) Outpatient Encounter Medications as of 10/15/2017  Medication Sig  . amLODipine (NORVASC) 10 MG tablet Take 1 tablet (10 mg total) by mouth daily.  . diclofenac  (VOLTAREN) 75 MG EC tablet Take 75 mg by mouth 2 (two) times daily.   . Glucosamine HCl (GLUCOSAMINE PO) Take 2 tablets by mouth daily.  . Multiple Vitamins-Minerals (MULTI-VITAMIN GUMMIES PO) Take 2 each by mouth daily.   Marland Kitchen omeprazole (PRILOSEC) 40 MG capsule Take 1 capsule (40 mg total) by mouth daily.  . sucralfate (CARAFATE) 1 g tablet Take 1 g by mouth 4 (four) times daily -  with meals and at bedtime.   Marland Kitchen tiZANidine (ZANAFLEX) 4 MG tablet Take 1 tablet (4 mg total) by mouth every 8 (eight) hours as needed for muscle spasms. (Patient taking differently: Take 4 mg by mouth at bedtime as needed for muscle spasms. )   No facility-administered encounter medications on file as of 10/15/2017.     Allergies (verified) Patient has no known allergies.   History: Past Medical History:  Diagnosis Date  . Arthritis   . Back pain   . Cataract   . DJD (degenerative joint disease)   . Full dentures   . GERD (gastroesophageal reflux disease)   . Glaucoma   . History of bleeding ulcers    patient reports multiple hospitalizations remotely  . Hypertension   . Wears glasses    Past Surgical History:  Procedure Laterality Date  . BREAST REDUCTION SURGERY Bilateral 04/25/2013   Procedure: MAMMARY REDUCTION  (BREAST);  Surgeon: Louisa Second, MD;  Location: [facility];  Service: Plastics;  Laterality: Bilateral;  . COLONOSCOPY     2008, normal  per patient  . COLONOSCOPY N/A 01/16/2017   Procedure: COLONOSCOPY;  Surgeon: West BaliFields, Sandi L, MD;  [facility];  Service: Endoscopy;  Laterality: N/A;  830   . COSMETIC SURGERY    . ESOPHAGOGASTRODUODENOSCOPY N/A 01/16/2017   Procedure: ESOPHAGOGASTRODUODENOSCOPY (EGD);  Surgeon: West BaliFields, Sandi L, MD;  [facility];  Service: Endoscopy;  Laterality: N/A;  . PARTIAL COLECTOMY N/A 06/29/2016   Procedure: PARTIAL COLECTOMY;  Surgeon: Ancil LinseyJason Evan Davis, MD;  [facility];  Service: General;  Laterality: N/A;  . TUBAL  LIGATION     Family History  Problem Relation Age of Onset  . Kidney disease Mother   . Stroke Mother   . Heart disease Father   . Colon cancer Neg Hx    Social History   Socioeconomic History  . Marital status: Divorced    Spouse name: None  . Number of children: 3  . Years of education: 4611  . Highest education level: None  Social Needs  . Financial resource strain: Not hard at all  . Food insecurity - worry: Never true  . Food insecurity - inability: Never true  . Transportation needs - medical: No  . Transportation needs - non-medical: No  Occupational History  . None  Tobacco Use  . Smoking status: Former Smoker    Types: Cigarettes    Last attempt to quit: 04/20/1993    Years since quitting: 24.5  . Smokeless tobacco: Never Used  Substance and Sexual Activity  . Alcohol use: Yes    Comment: rare  . Drug use: No  . Sexual activity: Not Currently  Other Topics Concern  . None  Social History Narrative  . None    Tobacco Counseling Counseling given: Not Answered Patient stopped smoking in 1994  Clinical Intake:    Activities of Daily Living In your present state of health, do you have any difficulty performing the following activities: 10/15/2017  Hearing? N  Vision? N  Difficulty concentrating or making decisions? N  Walking or climbing stairs? Y  Comment at times due to knee pain   Dressing or bathing? N  Doing errands, shopping? N  Preparing Food and eating ? N  Using the Toilet? N  In the past six months, have you accidently leaked urine? N  Do you have problems with loss of bowel control? N  Managing your Medications? N  Managing your Finances? N  Housekeeping or managing your Housekeeping? N  Some recent data might be hidden   Patient has trouble walking and climbing stairs at times due to her knee pain.   Immunizations and Health Maintenance  There is no immunization history on file for this patient. Health Maintenance Due  Topic Date  Due  . Hepatitis C Screening  1945/12/08  . TETANUS/TDAP  02/07/1966  . MAMMOGRAM  02/07/1997  . DEXA SCAN  02/08/2012  . PNA vac Low Risk Adult (1 of 2 - PCV13) 02/08/2012   Patient is scheduled for mammogram on 11/23/2017.  We [facility] to review immunizations.   Patient Care Team: Caryl NeverJones, Angel S, PA-C as PCP - General (Physician Assistant) West BaliFields, Sandi L, MD as Consulting Physician (Gastroenterology)  Indicate any recent Medical Services you may have received from other than [facility] providers in the past year (date may be approximate).     Assessment:   This is a routine wellness examination for Carley Hammedva.  Hearing/Vision screen No exam data present Patient does wear  glasses but has no concerns with vision or hearing Dietary issues and exercise activities discussed: Current Exercise Habits: Home exercise routine, Type of exercise: walking, Time (Minutes): 30, Frequency (Times/Week): 3, Weekly Exercise (Minutes/Week): 90, Intensity: Mild, Exercise limited by: orthopedic condition(s)  Goals    . Exercise 3x per week (30 min per time)      Depression Screen PHQ 2/9 Scores 10/15/2017 04/28/2017 11/19/2016 10/29/2016  PHQ - 2 Score 0 0 0 0    Fall Risk Fall Risk  10/15/2017 04/28/2017 11/19/2016 10/29/2016  Falls in the past year? No No No No    Is the patient's home free of loose throw rugs in walkways, pet beds, electrical cords, etc?   yes      Grab bars in the bathroom? yes      Handrails on the stairs?   yes      Adequate lighting?   yes  Timed Get Up and Go Performed   Cognitive Function:      Screening Tests Health Maintenance  Topic Date Due  . Hepatitis C Screening  04-Nov-1945  . TETANUS/TDAP  02/07/1966  . MAMMOGRAM  02/07/1997  . DEXA SCAN  02/08/2012  . PNA vac Low Risk Adult (1 of 2 - PCV13) 02/08/2012  . INFLUENZA VACCINE  06/09/2018 (Originally 04/01/2017)  . COLONOSCOPY  01/17/2027    Qualifies for  Shingles Vaccine? We have requested patients shot record  Cancer Screenings: Lung: Low Dose CT Chest recommended if Age 42-80 years, 30 pack-year currently smoking OR have quit w/in 15years. Patient does not qualify. Breast: Up to date on Mammogram? No  Patient is scheduled 11/23/2017 Up to date of Bone Density/Dexa? No Patient is scheduled 11/02/2017 Colorectal: Patient had on 01/16/2017  Additional Screenings:  Hepatitis B/HIV/Syphillis: Hepatitis C Screening:      Plan:  Patient to follow up with Lawanna Kobus to discuss orthopedic referral Patient is scheduled for mammogram 11/23/2017 Patient is scheduled for Dexa scan on 03/04 Patient is to call and schedule eye exam.   I have personally reviewed and noted the following in the patient's chart:   . Medical and social history . Use of alcohol, tobacco or illicit drugs  . Current medications and supplements . Functional ability and status . Nutritional status . Physical activity . Advanced directives . List of other physicians . Hospitalizations, surgeries, and ER visits in previous 12 months . Vitals . Screenings to include cognitive, depression, and falls . Referrals and appointments  In addition, I have reviewed and discussed with patient certain preventive protocols, quality metrics, and best practice recommendations. A written personalized care plan for preventive services as well as general preventive health recommendations were provided to patient.     Sherron Monday, LPN   1/61/0960    I have reviewed and agree with the above AWV documentation.   Murtis Sink, MD [facility] 10/16/2017, 8:33 AM

## 2017-10-26 NOTE — Addendum Note (Signed)
Addended by: Tamera PuntWRAY, WENDY S on: 10/26/2017 12:25 PM   Modules accepted: Level of Service

## 2017-11-02 ENCOUNTER — Ambulatory Visit (INDEPENDENT_AMBULATORY_CARE_PROVIDER_SITE_OTHER): Payer: Medicare HMO

## 2017-11-02 ENCOUNTER — Ambulatory Visit (INDEPENDENT_AMBULATORY_CARE_PROVIDER_SITE_OTHER): Payer: Medicare HMO | Admitting: Physician Assistant

## 2017-11-02 ENCOUNTER — Encounter: Payer: Self-pay | Admitting: Physician Assistant

## 2017-11-02 VITALS — BP 119/76 | HR 91 | Temp 97.8°F | Ht 65.0 in | Wt 175.0 lb

## 2017-11-02 DIAGNOSIS — M8588 Other specified disorders of bone density and structure, other site: Secondary | ICD-10-CM | POA: Diagnosis not present

## 2017-11-02 DIAGNOSIS — M1712 Unilateral primary osteoarthritis, left knee: Secondary | ICD-10-CM

## 2017-11-02 DIAGNOSIS — M8580 Other specified disorders of bone density and structure, unspecified site: Secondary | ICD-10-CM

## 2017-11-02 DIAGNOSIS — M810 Age-related osteoporosis without current pathological fracture: Secondary | ICD-10-CM | POA: Diagnosis not present

## 2017-11-02 DIAGNOSIS — I10 Essential (primary) hypertension: Secondary | ICD-10-CM

## 2017-11-02 DIAGNOSIS — M170 Bilateral primary osteoarthritis of knee: Secondary | ICD-10-CM | POA: Diagnosis not present

## 2017-11-02 DIAGNOSIS — Z780 Asymptomatic menopausal state: Secondary | ICD-10-CM | POA: Diagnosis not present

## 2017-11-02 MED ORDER — OMEPRAZOLE 40 MG PO CPDR: 40.0000 mg | DELAYED_RELEASE_CAPSULE | Freq: Every day | ORAL | 11 refills | Status: DC

## 2017-11-02 MED ORDER — AMLODIPINE BESYLATE 10 MG PO TABS: 10.0000 mg | ORAL_TABLET | Freq: Every day | ORAL | 11 refills | Status: DC

## 2017-11-02 MED ORDER — DICLOFENAC SODIUM 75 MG PO TBEC: 75.0000 mg | DELAYED_RELEASE_TABLET | Freq: Two times a day (BID) | ORAL | 11 refills | Status: DC

## 2017-11-02 MED ORDER — TIZANIDINE HCL 4 MG PO TABS: 4.0000 mg | ORAL_TABLET | Freq: Every evening | ORAL | 11 refills | Status: DC | PRN

## 2017-11-02 NOTE — Patient Instructions (Signed)
In a few days you may receive a survey in the mail or online from [employer] regarding your visit with us today. Please take a moment to fill this out. Your feedback is very important to our whole office. It can help us better understand your needs as well as improve your experience and satisfaction. Thank you for taking your time to complete it. We care about you.  Nita Whitmire, PA-C  

## 2017-11-02 NOTE — Progress Notes (Signed)
BP 119/76   Pulse 91   Temp 97.8 F (36.6 C) (Oral)   Ht 5\' 5"  (1.651 m)   Wt 175 lb (79.4 kg)   BMI 29.12 kg/m    Subjective:    Patient ID: Tiffany Bradley, female    DOB: 04/02/1946, 72 y.o.   MRN: [id number]  HPI: Tiffany Meansva Mcgrady is a 72 y.o. female presenting on 11/02/2017 for Follow-up (6 month ) and Referral (orthopedic knee )  Had issues getting surgery scheduled through Dr. Case. She had injections to help but they discussed getting the surgery. However no one from his office ever got it right. Does not feel good about the situation. Known DJD of left knee with deformity and gait being affected.  She would like a referral to another [facility].  Past Medical History:  Diagnosis Date  . Arthritis   . Back pain   . Cataract   . DJD (degenerative joint disease)   . Full dentures   . GERD (gastroesophageal reflux disease)   . Glaucoma   . History of bleeding ulcers    patient reports multiple hospitalizations remotely  . Hypertension   . Wears glasses    Relevant past medical, surgical, family and social history reviewed and updated as indicated. Interim medical history since our last visit reviewed. Allergies and medications reviewed and updated. DATA REVIEWED: CHART IN EPIC  Family History reviewed for pertinent findings.  Review of Systems  Constitutional: Negative.  Negative for activity change, fatigue and fever.  HENT: Negative.   Eyes: Negative.   Respiratory: Negative.  Negative for cough.   Cardiovascular: Negative.  Negative for chest pain.  Gastrointestinal: Negative.  Negative for abdominal pain.  Endocrine: Negative.   Genitourinary: Negative.  Negative for dysuria.  Musculoskeletal: Positive for arthralgias, gait problem, joint swelling and myalgias.  Skin: Negative.     Allergies as of 11/02/2017   No Known Allergies     Medication List        Accurate as of 11/02/17  2:00 PM. Always use your most recent med list.          amLODipine 10 MG  tablet Commonly known as:  NORVASC Take 1 tablet (10 mg total) by mouth daily.   diclofenac 75 MG EC tablet Commonly known as:  VOLTAREN Take 1 tablet (75 mg total) by mouth 2 (two) times daily.   GLUCOSAMINE PO Take 2 tablets by mouth daily.   MULTI-VITAMIN GUMMIES PO Take 2 each by mouth daily.   omeprazole 40 MG capsule Commonly known as:  PRILOSEC Take 1 capsule (40 mg total) by mouth daily.   sucralfate 1 g tablet Commonly known as:  CARAFATE Take 1 g by mouth 4 (four) times daily -  with meals and at bedtime.   tiZANidine 4 MG tablet Commonly known as:  ZANAFLEX Take 1 tablet (4 mg total) by mouth at bedtime as needed for muscle spasms.          Objective:    BP 119/76   Pulse 91   Temp 97.8 F (36.6 C) (Oral)   Ht 5\' 5"  (1.651 m)   Wt 175 lb (79.4 kg)   BMI 29.12 kg/m   No Known Allergies  Wt Readings from Last 3 Encounters:  11/02/17 175 lb (79.4 kg)  10/15/17 174 lb (78.9 kg)  04/28/17 170 lb 9.6 oz (77.4 kg)    Physical Exam  Constitutional: She is oriented to person, place, and time. She appears well-developed and well-nourished.  HENT:  Head: Normocephalic and atraumatic.  Right Ear: Tympanic membrane, external ear and ear canal normal.  Left Ear: Tympanic membrane, external ear and ear canal normal.  Nose: Nose normal. No rhinorrhea.  Mouth/Throat: Oropharynx is clear and moist and mucous membranes are normal. No oropharyngeal exudate or posterior oropharyngeal erythema.  Eyes: Conjunctivae and EOM are normal. Pupils are equal, round, and reactive to light.  Neck: Normal range of motion. Neck supple.  Cardiovascular: Normal rate, regular rhythm, normal heart sounds and intact distal pulses.  Pulmonary/Chest: Effort normal and breath sounds normal.  Abdominal: Soft. Bowel sounds are normal.  Musculoskeletal:       Left knee: She exhibits swelling and deformity. Tenderness found. Medial joint line tenderness noted.       Legs: Neurological:  She is alert and oriented to person, place, and time. She has normal reflexes.  Skin: Skin is warm and dry. No rash noted.  Psychiatric: She has a normal mood and affect. Her behavior is normal. Judgment and thought content normal.    Results for orders placed or performed in visit on 04/28/17  VITAMIN D 25 Hydroxy (Vit-D Deficiency, Fractures)  Result Value Ref Range   Vit D, 25-Hydroxy 32.9 30.0 - 100.0 ng/mL  CBC with Differential/Platelet  Result Value Ref Range   WBC 4.4 3.4 - 10.8 x10E3/uL   RBC 4.11 3.77 - 5.28 x10E6/uL   Hemoglobin 10.7 (L) 11.1 - 15.9 g/dL   Hematocrit 69.6 29.5 - 46.6 %   MCV 85 79 - 97 fL   MCH 26.0 (L) 26.6 - 33.0 pg   MCHC 30.7 (L) 31.5 - 35.7 g/dL   RDW 28.4 (H) 13.2 - 44.0 %   Platelets 278 150 - 379 x10E3/uL   Neutrophils 57 Not Estab. %   Lymphs 28 Not Estab. %   Monocytes 6 Not Estab. %   Eos 7 Not Estab. %   Basos 2 Not Estab. %   Neutrophils Absolute 2.5 1.4 - 7.0 x10E3/uL   Lymphocytes Absolute 1.3 0.7 - 3.1 x10E3/uL   Monocytes Absolute 0.3 0.1 - 0.9 x10E3/uL   EOS (ABSOLUTE) 0.3 0.0 - 0.4 x10E3/uL   Basophils Absolute 0.1 0.0 - 0.2 x10E3/uL   Immature Granulocytes 0 Not Estab. %   Immature Grans (Abs) 0.0 0.0 - 0.1 x10E3/uL  Iron  Result Value Ref Range   Iron 68 27 - 139 ug/dL      Assessment & Plan:   1. Primary osteoarthritis of both knees - Ambulatory referral to Orthopedic Surgery - tiZANidine (ZANAFLEX) 4 MG tablet; Take 1 tablet (4 mg total) by mouth at bedtime as needed for muscle spasms.  Dispense: 60 tablet; Refill: 11 - diclofenac (VOLTAREN) 75 MG EC tablet; Take 1 tablet (75 mg total) by mouth 2 (two) times daily.  Dispense: 60 tablet; Refill: 11  2. Primary osteoarthritis of left knee - Ambulatory referral to Orthopedic Surgery  3. Osteopenia, unspecified location - DG [facility] DEXA  4. Essential hypertension - amLODipine (NORVASC) 10 MG tablet; Take 1 tablet (10 mg total) by mouth daily.  Dispense: 30 tablet; Refill:  11   Continue all other maintenance medications as listed above.  Follow up plan: Return in about 6 months (around 05/05/2018) for recheck.  Educational handout given for survey  Remus Loffler PA-C [facility] [address]  [place], [place] [address] [phone]   11/02/2017, 2:00 PM

## 2017-11-11 ENCOUNTER — Telehealth: Payer: Self-pay | Admitting: Physician Assistant

## 2017-11-11 NOTE — Telephone Encounter (Signed)
Patient has appt Friday to go over Dexa..Marland Kitchen

## 2017-11-13 ENCOUNTER — Ambulatory Visit (INDEPENDENT_AMBULATORY_CARE_PROVIDER_SITE_OTHER): Payer: Medicare HMO | Admitting: Pharmacist Clinician (PhC)/ Clinical Pharmacy Specialist

## 2017-11-13 ENCOUNTER — Encounter: Payer: Self-pay | Admitting: Pharmacist Clinician (PhC)/ Clinical Pharmacy Specialist

## 2017-11-13 DIAGNOSIS — M810 Age-related osteoporosis without current pathological fracture: Secondary | ICD-10-CM | POA: Diagnosis not present

## 2017-11-13 NOTE — Progress Notes (Addendum)
Patient comes in today for dexascan results and review of treatment options.  She went through menopause in her early 5040's and did no take HRT.  She started menses at age 72 and was a smoker but quit at age [age].  Prior dietary history poor for calcium and vitamin D and had hypocalcemia for years and last vitamin D a year ago was 32mg /dL.  Patient does not do weight baring exercises due to knee pain.  She has a history of perforation of sigmoid colon and gastrtitis and GERD.  She is not a candidate for bisphosphonates.  A/P:  1.  Counseled patient on results which were t-score of -2.9 for spine and -2.4 for hip.  Osteoporosis.  2.  Can not take bisphosphonates due to GI diagnosis.  3.  Prio auth for Prolia will be obtained an injection given in office.  Patient had labs drawn today to check calcium level, renal function and vitamin D.  She has had a history of hypocalcemia so this will need to be resolved prior to starting Prolia.  4.  BMP and vitamin D checked today since patient has a history of vitamin D and calcium deficiency.  5.  Vitamin D 50,000IU weekly prescribed.  6.  Fall Prevention covered and patient will get weights to use at home.  7.  Patient said she will get knee replacement.  Total Time with patient 30 minutes  Chari ManningMichelle Che Rachal, PharmD, CPP, FNLA

## 2017-11-13 NOTE — Addendum Note (Signed)
Addended by: Chari ManningBOZOVICH, Eliott Amparan on: 11/13/2017 01:24 PM   Modules accepted: Orders

## 2017-11-14 LAB — BMP8+EGFR
BUN/Creatinine Ratio: 25 (ref 12–28)
BUN: 15 mg/dL (ref 8–27)
CALCIUM: 9.4 mg/dL (ref 8.7–10.3)
CHLORIDE: 108 mmol/L — AB (ref 96–106)
CO2: 25 mmol/L (ref 20–29)
Creatinine, Ser: 0.6 mg/dL (ref 0.57–1.00)
GFR calc non Af Amer: 93 mL/min/{1.73_m2} (ref >59)
GFR, EST AFRICAN AMERICAN: 107 mL/min/{1.73_m2} (ref >59)
Glucose: 95 mg/dL (ref 65–99)
POTASSIUM: 5.1 mmol/L (ref 3.5–5.2)
SODIUM: 147 mmol/L — AB (ref 134–144)

## 2017-11-14 LAB — VITAMIN D 25 HYDROXY (VIT D DEFICIENCY, FRACTURES): Vit D, 25-Hydroxy: 38.5 ng/mL (ref 30.0–100.0)

## 2017-11-20 ENCOUNTER — Telehealth: Payer: Self-pay | Admitting: Pharmacist Clinician (PhC)/ Clinical Pharmacy Specialist

## 2017-11-20 ENCOUNTER — Other Ambulatory Visit: Payer: Self-pay | Admitting: Physician Assistant

## 2017-11-20 NOTE — Telephone Encounter (Signed)
Called Tiffany Bradley after talking with Prudy FeelerAngel Jones regarding her history of low calcium levels.  Most recent level was normal with low normal vitamin D levels (already addressed at visit last Friday).  I asked patient to come in today to get PTH lab checked before we prescribe Prolia for her.  If she can not take Prolia, [facility].  She can not take bisphosphonates due to GI issues.

## 2017-11-20 NOTE — Progress Notes (Signed)
I will call patient today with results and let her know to come in for additional labs and if they are normal prolia is still an option, if not will need to look [facility].  Tiffany DusterMichelle

## 2017-11-20 NOTE — Telephone Encounter (Signed)
-----   Message from Remus LofflerAngel S Jones, PA-C sent at 11/20/2017  8:45 AM EDT ----- Hi,  I will place an order for PTH and calcium and then follow from there.  Will you be contacting her and what are the next options?  Lawanna KobusAngel ----- Message ----- From: Chari ManningBozovich, Adlee Paar, PharmD Sent: 11/18/2017  10:19 AM To: Remus LofflerAngel S Jones, PA-C  Legrand PittsHi Angel,  I saw Mrs. Odom last week for osteoporosis.  She has had chronic low calcium levels which precludes her from taking Prolia and she is not a candidate for bisphosphonates due to her GI issues.  Has hypoparathyroidism been ruled out already?  I know she may have malabsorption issues as well.  I haven't called her yet with the results.  Thanks  [employer] FinancialMichelle

## 2017-11-23 ENCOUNTER — Other Ambulatory Visit: Payer: Medicare HMO

## 2017-11-23 DIAGNOSIS — Z1231 Encounter for screening mammogram for malignant neoplasm of breast: Secondary | ICD-10-CM | POA: Diagnosis not present

## 2017-11-23 LAB — HM MAMMOGRAPHY

## 2017-11-24 LAB — PTH, INTACT AND CALCIUM
Calcium: 9.3 mg/dL (ref 8.7–10.3)
PTH: 26 pg/mL (ref 15–65)

## 2017-12-14 ENCOUNTER — Encounter: Payer: Self-pay | Admitting: *Deleted

## 2017-12-21 DIAGNOSIS — K08109 Complete loss of teeth, unspecified cause, unspecified class: Secondary | ICD-10-CM | POA: Diagnosis not present

## 2017-12-21 DIAGNOSIS — I10 Essential (primary) hypertension: Secondary | ICD-10-CM | POA: Diagnosis not present

## 2017-12-21 DIAGNOSIS — N3941 Urge incontinence: Secondary | ICD-10-CM | POA: Diagnosis not present

## 2017-12-21 DIAGNOSIS — E669 Obesity, unspecified: Secondary | ICD-10-CM | POA: Diagnosis not present

## 2017-12-21 DIAGNOSIS — H269 Unspecified cataract: Secondary | ICD-10-CM | POA: Diagnosis not present

## 2017-12-21 DIAGNOSIS — G629 Polyneuropathy, unspecified: Secondary | ICD-10-CM | POA: Diagnosis not present

## 2017-12-21 DIAGNOSIS — M62838 Other muscle spasm: Secondary | ICD-10-CM | POA: Diagnosis not present

## 2017-12-21 DIAGNOSIS — G8929 Other chronic pain: Secondary | ICD-10-CM | POA: Diagnosis not present

## 2017-12-21 DIAGNOSIS — R269 Unspecified abnormalities of gait and mobility: Secondary | ICD-10-CM | POA: Diagnosis not present

## 2017-12-21 DIAGNOSIS — K219 Gastro-esophageal reflux disease without esophagitis: Secondary | ICD-10-CM | POA: Diagnosis not present

## 2017-12-29 ENCOUNTER — Encounter: Payer: Self-pay | Admitting: Orthopedic Surgery

## 2017-12-29 ENCOUNTER — Ambulatory Visit (INDEPENDENT_AMBULATORY_CARE_PROVIDER_SITE_OTHER): Payer: Medicare HMO

## 2017-12-29 ENCOUNTER — Ambulatory Visit: Payer: Medicare HMO | Admitting: Orthopedic Surgery

## 2017-12-29 VITALS — BP 157/88 | HR 81 | Ht 64.0 in | Wt 170.0 lb

## 2017-12-29 DIAGNOSIS — M170 Bilateral primary osteoarthritis of knee: Secondary | ICD-10-CM

## 2017-12-29 DIAGNOSIS — M5440 Lumbago with sciatica, unspecified side: Secondary | ICD-10-CM

## 2017-12-29 DIAGNOSIS — M4316 Spondylolisthesis, lumbar region: Secondary | ICD-10-CM | POA: Diagnosis not present

## 2017-12-29 NOTE — Patient Instructions (Addendum)
Physical therapy has been ordered for you a in [place]. Please let us know if you do not hear anything within one week call [phone] ask for Amy   Spondylolisthesis Spondylolisthesis is when one of the bones in the spine (vertebrae) slips forward and out of place. This most commonly occurs in the lower back (lumbar spine), but it can happen anywhere along the spine. A vertebra may move out of place due to a previous back injury. In some cases, the injury may go unnoticed until the vertebra moves out of place later in life. Spondylolisthesis may also be caused by an injury or a condition that affects the bones. What are the causes? This condition may be caused by:  Injury (trauma). This is often a result of doing sports or physical activities that: ? Put a lot of strain on the bones in the lower back. ? Involve repetitive overstretching (hyperextension) of the spine.  A condition that affects the bones, such as osteoarthritis or cancer.  Changes in the spine that happen due to age-related wear and tear.  What increases the risk? The following factors may make you more likely to develop this condition:  Participating in sports or activities that put a lot of strain on the lower back, including: ? Gymnastics. ? Figure skating. ? Weight lifting. ? Football.  Having a condition that affects the bones.  Being older than age 34.  What are the signs or symptoms? Symptoms of this condition may include:  Mild to severe pain in the legs, lower back, or buttocks.  An abnormal way of walking (abnormal gait).  Poor posture.  Muscle stiffness, specifically in the hamstrings. The hamstrings are in the backs of the thighs.  Weakness, numbness, or a tingling sensation in the legs.  Symptoms may get worse when standing, and they may temporarily get better when sitting down or bending forward. In some cases, there may be no symptoms of this condition. How is this diagnosed?  This  condition may be diagnosed based on:  Your symptoms.  Your medical history.  A physical exam. ? Your health care provider may push on certain areas to determine the source of your pain. ? You may be asked to bend forward, backward, and side to side so your health care provider can assess the severity of your pain and your range of motion.  Imaging tests, such as: ? X-rays. ? CT scan. ? MRI.  How is this treated? Treatment for this condition may include:  Resting. This may involve avoiding or modifying activities that put strain on your back until your symptoms improve.  Medicines to help relieve pain.  NSAIDs to help reduce swelling and discomfort.  Injections of medicine (cortisone) in your back. These injections can to help relieve pain and numbness.  A brace to stabilize and support your back.  Physical therapy. This may include working with an occupational therapist or physical therapist who can teach you how to reduce pressure on your back while you do everyday activities.  Surgery. This may be needed if: ? Other treatment methods do not improve your condition. ? Your symptoms do not go away after 3-6 months. ? You are unable to walk or stand. ? You have severe pain.  Follow these instructions at home: If you have a brace:  Wear it as told by your health care provider. Remove it only as told by your health care provider.  Do not let your brace get wet if it is not  waterproof.  Keep the brace clean. Driving  Do not drive or operate heavy machinery until you know how your pain medicine affects you.  Ask your health care provider when it is safe to drive if you have a back brace. Activity  Rest and return to your normal activities as told by your health care provider. Ask your health care provider what activities are safe for you.  Avoid activities that take a lot of effort (are strenuous) for as long as told by your health care provider.  Do exercises as told  by your health care provider. General instructions  Take over-the-counter and prescription medicines only as told by your health care provider.  If you have questions or concerns about safety while taking pain medicine, talk with your health care provider.  Do not use any tobacco products, such as cigarettes, chewing tobacco, and e-cigarettes. Tobacco can delay bone healing. If you need help quitting, ask your health care provider.  Keep all follow-up visits as told by your health care provider. This is important. How is this prevented?  Warm up and stretch before being active.  Cool down and stretch after being active.  Give your body time to rest between periods of activity.  Make sure to use equipment that fits you.  Be safe and responsible while being active to avoid falls.  Do at least 150 minutes of moderate-intensity exercise each week, such as brisk walking or water aerobics.  Maintain physical fitness, including: ? Strength. ? Flexibility. ? Cardiovascular fitness. ? Endurance. Contact a health care provider if:  You have pain that gets worse or does not get better. Get help right away if:  You have severe back pain.  You develop weakness or numbness in your legs.  You are unable to stand or walk. This information is not intended to replace advice given to you by your health care provider. Make sure you discuss any questions you have with your health care provider. Document Released: 08/18/2005 Document Revised: 04/24/2016 Document Reviewed: 05/29/2015 Elsevier Interactive Patient Education  [employer].

## 2017-12-29 NOTE — Progress Notes (Signed)
NEW PATIENT OFFICE VISIT   Chief Complaint  Patient presents with  . Knee Pain    left greater than right knee pain      MEDICAL DECISION SECTION  xrays ordered?  Yes 3 views of the knee right and left AND the lumbar spine  My independent reading of xrays: Please see dictated report there is some arthritis in both areas, the right knee shows valgus arthritis in the lumbar spine shows severe degenerative disc disease spondylolisthesis  I also reviewed several pages of notes from Dr. Jeannett Senior Case indicating that the patient was diagnosed with bilateral osteoarthritis had multiple injections.  The patient also brought in sets of imaging from January 2017 they show both knees standing position with AP standing and then slight flexion standing and then a lateral and patellar view on the left and that shows severe valgus deformity on the left subchondral sclerosis and osteophyte formation joint space narrowing and squaring of the condyle  On the right we see less arthritis less valgus deformity primarily on the left side with subchondral sclerosis.   Encounter Diagnoses  Name Primary?  . Primary osteoarthritis of both knees Yes  . Spondylolisthesis at L4-L5 level    PLAN:  The first thing we need to do is give her some therapy on her lower back to improve her strength and coordination and decrease the pain in the left side of the leg.  I have informed her that correcting valgus knee can lead to foot drop and the need to wear a AFO brace.  We will reiterate this if we did indeed come to surgery.  She can have tramadol for pain in the knee for the arthritis if the diclofenac is not holding her.  No orders of the defined types were placed in this encounter.   Chief Complaint  Patient presents with  . Knee Pain    left greater than right knee pain     72 year old female presents as a transfer of care originally booked a second opinion but patient wants to transfer care  She is 72  years old she was followed by Dr. Case for osteoarthritis of both knees with multiple injections.  However, she did not improve with the injection on the last visit and was told she could have knee replacement if the injection did not work.  She went back to see him for the surgery and there was some confusion about her records and she wants to transfer care  She has several complaints.  #1 she says that her left leg is crooked and is worsening in terms of the alignment she has pain in her back hip and entire left leg as well as lateral joint line pain she has difficulties getting out of a chair she says that the knee feels loose throbs at night requiring her to get up and walk around to relieve the pain.  Her left knee pain is 9 out of 10 at its worse.  She is on diclofenac 75 mg and glucosamine and chondroitin sulfate  The right knee is painful and is "crooked" as well but the pain on the right side is mild.  In terms of her activities of daily living she has difficulties getting out of a seated position sitting down and standing for long periods of time   Review of Systems  Constitutional: Negative for chills and fever.  Musculoskeletal: Positive for back pain.  Neurological: Negative for tingling, sensory change and focal weakness.  Past Medical History:  Diagnosis Date  . Arthritis   . Back pain   . Cataract   . DJD (degenerative joint disease)   . Full dentures   . GERD (gastroesophageal reflux disease)   . Glaucoma   . History of bleeding ulcers    patient reports multiple hospitalizations remotely  . Hypertension   . Wears glasses     Past Surgical History:  Procedure Laterality Date  . BREAST REDUCTION SURGERY Bilateral 04/25/2013   Procedure: MAMMARY REDUCTION  (BREAST);  Surgeon: Louisa Second, MD;  Location: [facility];  Service: Plastics;  Laterality: Bilateral;  . COLONOSCOPY     2008, normal per patient  . COLONOSCOPY N/A 01/16/2017   Procedure:  COLONOSCOPY;  Surgeon: West Bali, MD;  Location: [facility];  Service: Endoscopy;  Laterality: N/A;  830   . COSMETIC SURGERY    . ESOPHAGOGASTRODUODENOSCOPY N/A 01/16/2017   Procedure: ESOPHAGOGASTRODUODENOSCOPY (EGD);  Surgeon: West Bali, MD;  Location: [facility];  Service: Endoscopy;  Laterality: N/A;  . PARTIAL COLECTOMY N/A 06/29/2016   Procedure: PARTIAL COLECTOMY;  Surgeon: Ancil Linsey, MD;  Location: [facility];  Service: General;  Laterality: N/A;  . TUBAL LIGATION      Family History  Problem Relation Age of Onset  . Kidney disease Mother   . Stroke Mother   . Heart disease Father   . Colon cancer Neg Hx    Social History   Tobacco Use  . Smoking status: Former Smoker    Types: Cigarettes    Last attempt to quit: 04/20/1993    Years since quitting: 24.7  . Smokeless tobacco: Never Used  Substance Use Topics  . Alcohol use: Yes    Comment: rare  . Drug use: No    Current Meds  Medication Sig  . amLODipine (NORVASC) 10 MG tablet Take 1 tablet (10 mg total) by mouth daily.  . diclofenac (VOLTAREN) 75 MG EC tablet Take 1 tablet (75 mg total) by mouth 2 (two) times daily.  . Glucosamine HCl (GLUCOSAMINE PO) Take 2 tablets by mouth daily.  . Multiple Vitamins-Minerals (MULTI-VITAMIN GUMMIES PO) Take 2 each by mouth daily.     BP (!) 157/88   Pulse 81   Ht  (1.626 m)   Wt 170 lb (77.1 kg)   BMI 29.18 kg/m   Physical Exam  Constitutional: She is oriented to person, place, and time. She appears well-developed and well-nourished.  Neurological: She is alert and oriented to person, place, and time.  Psychiatric: She has a normal mood and affect. Judgment normal.  Vitals reviewed.   Ortho Exam   LEFT KNEE:  August alignment left knee fairly severe.  Flexion 95 degrees with 10 degree flexion contracture.  Pseudolaxity varus valgus plane firm endpoints.  No AP laxity.  Muscle tone normal.  Skin normal.  No rashes no ulcers no lacerations or  previous incisions  Pulse and temperature normal no edema or swelling normal sensation in the left leg and foot with normal deep tendon reflexes and no pathologic reflexes on the left straight leg raise is positive for posterior thigh pain running to the knee from the hip and lower back.  RIGHT KNEE mild valgus deformity flexion 115 5 degree flexion contracture no laxity in either plane muscle tone and strength normal skin is normal without laceration ulceration or erythema pulse and perfusion are normal without edema sensation is intact and deep tendon reflexes are normal there are  no pathologic reflexes  LOWER BACK: She has tenderness in her lower back especially on the left side but some central as well.

## 2017-12-30 ENCOUNTER — Encounter: Payer: Self-pay | Admitting: Orthopedic Surgery

## 2018-01-05 ENCOUNTER — Ambulatory Visit: Payer: Medicare HMO | Attending: Orthopedic Surgery | Admitting: Physical Therapy

## 2018-01-05 ENCOUNTER — Encounter: Payer: Self-pay | Admitting: Physical Therapy

## 2018-01-05 DIAGNOSIS — M545 Low back pain: Secondary | ICD-10-CM | POA: Insufficient documentation

## 2018-01-05 DIAGNOSIS — G8929 Other chronic pain: Secondary | ICD-10-CM | POA: Diagnosis not present

## 2018-01-05 DIAGNOSIS — R293 Abnormal posture: Secondary | ICD-10-CM | POA: Insufficient documentation

## 2018-01-05 NOTE — Therapy (Signed)
[facility] [address] [place], [place], [address] Phone: [phone]   Fax:  [phone]  Physical Therapy Evaluation  Patient Details  Name: Tiffany Bradley MRN: [id number] Date of Birth: 06-Sep-1945 Referring Provider: Fuller Canada MD   Encounter Date: 01/05/2018  PT End of Session - 01/05/18 1328    Visit Number  1    Number of Visits  12    Date for PT Re-Evaluation  03/02/18    Authorization Type  10TH VISIT PROGRESS NOTE AND KX MODIFIER AFTER THE 15 VISIT.    PT Start Time  0105    PT Stop Time  0153    PT Time Calculation (min)  48 min    Activity Tolerance  Patient tolerated treatment well    Behavior During Therapy  WFL for tasks assessed/performed       Past Medical History:  Diagnosis Date  . Arthritis   . Back pain   . Cataract   . DJD (degenerative joint disease)   . Full dentures   . GERD (gastroesophageal reflux disease)   . Glaucoma   . History of bleeding ulcers    patient reports multiple hospitalizations remotely  . Hypertension   . Wears glasses     Past Surgical History:  Procedure Laterality Date  . BREAST REDUCTION SURGERY Bilateral 04/25/2013   Procedure: MAMMARY REDUCTION  (BREAST);  Surgeon: Louisa Second, MD;  Location: [facility];  Service: Plastics;  Laterality: Bilateral;  . COLONOSCOPY     2008, normal per patient  . COLONOSCOPY N/A 01/16/2017   Procedure: COLONOSCOPY;  Surgeon: West Bali, MD;  [facility];  Service: Endoscopy;  Laterality: N/A;  830   . COSMETIC SURGERY    . ESOPHAGOGASTRODUODENOSCOPY N/A 01/16/2017   Procedure: ESOPHAGOGASTRODUODENOSCOPY (EGD);  Surgeon: West Bali, MD;  [facility];  Service: Endoscopy;  Laterality: N/A;  . PARTIAL COLECTOMY N/A 06/29/2016   Procedure: PARTIAL COLECTOMY;  Surgeon: Ancil Linsey, MD;  Location: [facility];  Service: General;  Laterality: N/A;  . TUBAL LIGATION      There were no vitals  filed for this visit.   Subjective Assessment - 01/05/18 1330    Subjective  The patient reports ongoing low back pain that has been ongoing for a bout a year.  Lying down decreases her pain and walking and standing increases her pain.    Pertinent History  OP; DJD;     Limitations  Standing    How long can you stand comfortably?  10 minutes.    How long can you walk comfortably?  Short community distances.    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    Currently in Pain?  Yes    Pain Score  6     Pain Location  Back    Pain Orientation  Right;Left;Mid    Pain Descriptors / Indicators  Aching    Pain Type  Chronic pain    Pain Onset  More than a month ago    Pain Frequency  Constant    Aggravating Factors   See above.    Pain Relieving Factors  See above.         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    Referring Provider  Fuller Canada MD    Onset Date/Surgical Date  -- ~one year.  Precautions   Precautions  -- OP.      Restrictions   Weight Bearing Restrictions  No      Balance Screen   Has the patient fallen in the past 6 months  No    Has the patient had a decrease in activity level because of a fear of falling?   Yes    Is the patient reluctant to leave their home because of a fear of falling?   Yes      Home Environment   Living Environment  Private residence      Prior Function   Level of Independence  Independent      Posture/Postural Control   Posture/Postural Control  Postural limitations    Postural Limitations  Rounded Shoulders;Forward head;Decreased lumbar lordosis;Flexed trunk;Weight shift right      ROM / Strength   AROM / PROM / Strength  AROM;Strength Severe left genu valgum.      AROM   Overall AROM   -- WFL for bilateral LE's.    Overall AROM Comments  Deferred spinal range of motion testing due to OP.      Strength   Overall Strength  Comments  Bilateral hip flexion strength= 4/5; knees and ankles normal.      Palpation   Palpation comment  Tender at L4-S1 and adjacent bilateral across the patient's low back from this region.      Special Tests    Special Tests  -- Normal Patellar DTR's; absent Achille's DTR's.    Other special tests  Right LE slightly longer than left.  (-) SLR testing.      Ambulation/Gait   Gait Comments  The patient walks in trunk flexion and appears to be in obvious pain.  Her left knee is remarkable for a great deal of valgum.                Objective measurements completed on examination: See above findings.      [facility] Adult PT Treatment/Exercise - 01/05/18 0001      Modalities   Modalities  Electrical Stimulation;Moist Heat      Moist Heat Therapy   Number Minutes Moist Heat  15 Minutes    Moist Heat Location  Lumbar Spine      Electrical Stimulation   Electrical Stimulation Location  Lower lumbar.    Electrical Stimulation Action  IFC    Electrical Stimulation Parameters  80-150 Hz x 15 minutes.    Electrical Stimulation Goals  Pain               PT Short Term Goals - 01/05/18 1358      PT SHORT TERM GOAL #1   Title  STG's= LTG's.        PT Long Term Goals - 01/05/18 1358      PT LONG TERM GOAL #1   Title  Independent with a HEP.    Time  6    Period  Weeks    Status  New      PT LONG TERM GOAL #2   Title  Stand 20 minutes with pain not > 3/10.    Time  6    Period  Weeks    Status  New      PT LONG TERM GOAL #3   Title  Perform ADL's with pain not > 3/10.    Period  Weeks    Status  New  Plan - 01/05/18 1341    Clinical Impression Statement  The patient presents to OPPT with c/o of low back pain that has not been improving for over a year.  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  She has diffuse pain across her lower back region.  She unweights her left side due to knee pain and  extreme valgum.  She has a loss of lumbar lordosis and absent bilateral Achille's DTR's.  Her pain and deficits have impaired her functional mobility.  Patient will benefit from skilled physical therapy intervention.    History and Personal Factors relevant to plan of care:  OP; DJD;     Clinical Presentation  Evolving    Clinical Presentation due to:  Not improving.    Clinical Decision Making  Moderate    Rehab Potential  Good    PT Frequency  2x / week    PT Duration  6 weeks    PT Treatment/Interventions  ADLs/Self Care Home Management;Cryotherapy;Electrical Stimulation;Ultrasound;Moist Heat;Therapeutic activities;Therapeutic exercise;Patient/family education;Manual techniques    PT Next Visit Plan  No spinal loading; HMP, Modalities and STW/M to low back; low-level core exercises (ie:  Hip bridges).    Consulted and Agree with Plan of Care  Patient       Patient will benefit from skilled therapeutic intervention in order to improve the following deficits and impairments:  Abnormal gait, Decreased activity tolerance, Postural dysfunction, Pain  Visit Diagnosis: Chronic bilateral low back pain without sciatica - Plan: PT plan of care cert/re-cert  Abnormal posture - Plan: PT plan of care cert/re-cert     Problem List Patient Active Problem List   Diagnosis Date Noted  . Primary osteoarthritis of left knee 11/02/2017  . Iron deficiency anemia 04/28/2017  . Vitamin D deficiency 04/28/2017  . Osteopenia 04/28/2017  . Gastritis, erosive   . Abdominal pain, epigastric 12/12/2016  . Colon cancer screening 11/03/2016  . Essential hypertension 11/03/2016  . Primary osteoarthritis of both knees 11/03/2016  . GERD (gastroesophageal reflux disease) 11/03/2016  . Age-related osteoporosis without current pathological fracture 11/03/2016  . Perforation of sigmoid colon (HCC) 06/29/2016    Ramar Nobrega, [place] MPT 01/05/2018, 2:08 PM  [facility] [address] [place], [place], [address] Phone: [phone]   Fax:  [phone]  Name: Shamyia Grandpre MRN: [id number] Date of Birth: May 13, 1946

## 2018-01-07 ENCOUNTER — Ambulatory Visit: Payer: Medicare HMO | Admitting: *Deleted

## 2018-01-07 DIAGNOSIS — M545 Low back pain: Principal | ICD-10-CM

## 2018-01-07 DIAGNOSIS — G8929 Other chronic pain: Secondary | ICD-10-CM

## 2018-01-07 DIAGNOSIS — R293 Abnormal posture: Secondary | ICD-10-CM | POA: Diagnosis not present

## 2018-01-07 NOTE — Therapy (Signed)
[facility] [address] [place], [place], [address] Phone: [phone]   Fax:  [phone]  Physical Therapy Treatment  Patient Details  Name: Tiffany Bradley MRN: [id number] Date of Birth: Jul 04, 1946 Referring Provider: Fuller Canada MD   Encounter Date: 01/07/2018  PT End of Session - 01/07/18 1618    Visit Number  2    Number of Visits  12    Date for PT Re-Evaluation  03/02/18    Authorization Type  10TH VISIT PROGRESS NOTE AND KX MODIFIER AFTER THE 15 VISIT.    PT Start Time  1300    PT Stop Time  1350    PT Time Calculation (min)  50 min    Activity Tolerance  Patient tolerated treatment well    Behavior During Therapy  WFL for tasks assessed/performed       Past Medical History:  Diagnosis Date  . Arthritis   . Back pain   . Cataract   . DJD (degenerative joint disease)   . Full dentures   . GERD (gastroesophageal reflux disease)   . Glaucoma   . History of bleeding ulcers    patient reports multiple hospitalizations remotely  . Hypertension   . Wears glasses     Past Surgical History:  Procedure Laterality Date  . BREAST REDUCTION SURGERY Bilateral 04/25/2013   Procedure: MAMMARY REDUCTION  (BREAST);  Surgeon: Louisa Second, MD;  Location: [facility];  Service: Plastics;  Laterality: Bilateral;  . COLONOSCOPY     2008, normal per patient  . COLONOSCOPY N/A 01/16/2017   Procedure: COLONOSCOPY;  Surgeon: West Bali, MD;  [facility];  Service: Endoscopy;  Laterality: N/A;  830   . COSMETIC SURGERY    . ESOPHAGOGASTRODUODENOSCOPY N/A 01/16/2017   Procedure: ESOPHAGOGASTRODUODENOSCOPY (EGD);  Surgeon: West Bali, MD;  [facility];  Service: Endoscopy;  Laterality: N/A;  . PARTIAL COLECTOMY N/A 06/29/2016   Procedure: PARTIAL COLECTOMY;  Surgeon: Ancil Linsey, MD;  Location: [facility];  Service: General;  Laterality: N/A;  . TUBAL LIGATION      There were no vitals  filed for this visit.  Subjective Assessment - 01/07/18 1617    Subjective   Felt better after last Rx    Pertinent History  OP; DJD;     Limitations  Standing    How long can you stand comfortably?  10 minutes.    How long can you walk comfortably?  Short community distances.    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    Currently in Pain?  Yes    Pain Score  6     Pain Location  Back    Pain Orientation  Right    Pain Type  Chronic pain    Pain Onset  More than a month ago                       [facility] Adult PT Treatment/Exercise - 01/07/18 0001      Modalities   Modalities  Electrical Stimulation;Moist Heat;Ultrasound      Moist Heat Therapy   Number Minutes Moist Heat  15 Minutes    Moist Heat Location  Lumbar Spine      Electrical Stimulation   Electrical Stimulation Location  Lower lumbar. IFC 80-150hz  x 15 mins    Electrical Stimulation Goals  Pain      Ultrasound   Ultrasound Location  1.5 w/cm2 x 12 mins to Bil LB paras    Ultrasound Parameters  RT sidelying    Ultrasound Goals  Pain      Manual Therapy   Manual Therapy  Soft tissue mobilization    Soft tissue mobilization  STW to bil paras in RT sidelying               PT Short Term Goals - 01/05/18 1358      PT SHORT TERM GOAL #1   Title  STG's= LTG's.        PT Long Term Goals - 01/05/18 1358      PT LONG TERM GOAL #1   Title  Independent with a HEP.    Time  6    Period  Weeks    Status  New      PT LONG TERM GOAL #2   Title  Stand 20 minutes with pain not > 3/10.    Time  6    Period  Weeks    Status  New      PT LONG TERM GOAL #3   Title  Perform ADL's with pain not > 3/10.    Period  Weeks    Status  New            Plan - 01/07/18 1629    Clinical Impression Statement  The Pt arrived today doing about the same with LBP on both sides. Rx consisted of modalities and STW to decrease pain in LB. She did well with [place] for  decreased pain/tension, but had notable soreness still Bil LB paras during  STW. Decreased pain after session with normal modality response.    Clinical Presentation  Evolving    Clinical Decision Making  Moderate    Rehab Potential  Good    PT Frequency  2x / week    PT Duration  6 weeks    PT Treatment/Interventions  ADLs/Self Care Home Management;Cryotherapy;Electrical Stimulation;Ultrasound;Moist Heat;Therapeutic activities;Therapeutic exercise;Patient/family education;Manual techniques    PT Next Visit Plan  No spinal loading; HMP, Modalities and STW/M to low back; low-level core exercises (ie:  Hip bridges).    Consulted and Agree with Plan of Care  Patient       Patient will benefit from skilled therapeutic intervention in order to improve the following deficits and impairments:  Abnormal gait, Decreased activity tolerance, Postural dysfunction, Pain  Visit Diagnosis: Chronic bilateral low back pain without sciatica  Abnormal posture     Problem List Patient Active Problem List   Diagnosis Date Noted  . Primary osteoarthritis of left knee 11/02/2017  . Iron deficiency anemia 04/28/2017  . Vitamin D deficiency 04/28/2017  . Osteopenia 04/28/2017  . Gastritis, erosive   . Abdominal pain, epigastric 12/12/2016  . Colon cancer screening 11/03/2016  . Essential hypertension 11/03/2016  . Primary osteoarthritis of both knees 11/03/2016  . GERD (gastroesophageal reflux disease) 11/03/2016  . Age-related osteoporosis without current pathological fracture 11/03/2016  . Perforation of sigmoid colon (HCC) 06/29/2016    Yoshio Seliga,CHRIS, PTA 01/07/2018, 4:45 PM  [facility] [address] [place], [place], [address] Phone: [phone]   Fax:  [phone]  Name: Tiffany Bradley MRN: [id number] Date of Birth: [birth year]

## 2018-01-12 ENCOUNTER — Ambulatory Visit: Payer: Medicare HMO | Admitting: *Deleted

## 2018-01-12 DIAGNOSIS — M545 Low back pain: Secondary | ICD-10-CM | POA: Diagnosis not present

## 2018-01-12 DIAGNOSIS — R293 Abnormal posture: Secondary | ICD-10-CM | POA: Diagnosis not present

## 2018-01-12 DIAGNOSIS — G8929 Other chronic pain: Secondary | ICD-10-CM | POA: Diagnosis not present

## 2018-01-12 NOTE — Therapy (Signed)
[facility] [address] [place], [place], [address] Phone: [phone]   Fax:  [phone]  Physical Therapy Treatment  Patient Details  Name: Tiffany Bradley MRN: [id number] Date of Birth: 1946-02-06 Referring Provider: Fuller Canada MD   Encounter Date: 01/12/2018  PT End of Session - 01/12/18 1354    Visit Number  3    Number of Visits  12    Date for PT Re-Evaluation  03/02/18    Authorization Type  10TH VISIT PROGRESS NOTE AND KX MODIFIER AFTER THE 15 VISIT.    PT Start Time  1304    PT Stop Time  1354    PT Time Calculation (min)  50 min       Past Medical History:  Diagnosis Date  . Arthritis   . Back pain   . Cataract   . DJD (degenerative joint disease)   . Full dentures   . GERD (gastroesophageal reflux disease)   . Glaucoma   . History of bleeding ulcers    patient reports multiple hospitalizations remotely  . Hypertension   . Wears glasses     Past Surgical History:  Procedure Laterality Date  . BREAST REDUCTION SURGERY Bilateral 04/25/2013   Procedure: MAMMARY REDUCTION  (BREAST);  Surgeon: Louisa Second, MD;  Location: [facility];  Service: Plastics;  Laterality: Bilateral;  . COLONOSCOPY     2008, normal per patient  . COLONOSCOPY N/A 01/16/2017   Procedure: COLONOSCOPY;  Surgeon: West Bali, MD;  [facility];  Service: Endoscopy;  Laterality: N/A;  830   . COSMETIC SURGERY    . ESOPHAGOGASTRODUODENOSCOPY N/A 01/16/2017   Procedure: ESOPHAGOGASTRODUODENOSCOPY (EGD);  Surgeon: West Bali, MD;  [facility];  Service: Endoscopy;  Laterality: N/A;  . PARTIAL COLECTOMY N/A 06/29/2016   Procedure: PARTIAL COLECTOMY;  Surgeon: Ancil Linsey, MD;  [facility];  Service: General;  Laterality: N/A;  . TUBAL LIGATION      There were no vitals filed for this visit.  Subjective Assessment - 01/12/18 1309    Subjective   Felt better after last Rx. Only 5/10  today. Pain is not all day now    Pertinent History  OP; DJD;     Limitations  Standing    How long can you stand comfortably?  10 minutes.    How long can you walk comfortably?  Short community distances.    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    Currently in Pain?  Yes    Pain Score  5     Pain Location  Back    Pain Descriptors / Indicators  Aching    Pain Type  Chronic pain    Pain Onset  More than a month ago                       [facility] Adult PT Treatment/Exercise - 01/12/18 0001      Exercises   Exercises  Lumbar      Lumbar Exercises: Supine   Ab Set  10 reps;5 seconds    Bent Knee Raise  10 reps;3 seconds      Modalities   Modalities  Electrical Stimulation;Moist Heat;Ultrasound      Moist Heat Therapy   Number Minutes Moist Heat  15 Minutes    Moist Heat Location  Lumbar Spine      Electrical Stimulation   Electrical Stimulation Location  Lower lumbar. IFC 80-150hz  x 15 mins    Electrical Stimulation Goals  Pain      Ultrasound   Ultrasound Location  1.5 w/cm2 x 12 mins    Ultrasound Parameters  RT sidelying    Ultrasound Goals  Pain      Manual Therapy   Manual Therapy  Soft tissue mobilization    Soft tissue mobilization  STW to bil paras in RT sidelying and LT QL               PT Short Term Goals - 01/05/18 1358      PT SHORT TERM GOAL #1   Title  STG's= LTG's.        PT Long Term Goals - 01/05/18 1358      PT LONG TERM GOAL #1   Title  Independent with a HEP.    Time  6    Period  Weeks    Status  New      PT LONG TERM GOAL #2   Title  Stand 20 minutes with pain not > 3/10.    Time  6    Period  Weeks    Status  New      PT LONG TERM GOAL #3   Title  Perform ADL's with pain not > 3/10.    Period  Weeks    Status  New            Plan - 01/12/18 1355    Clinical Impression Statement  Pt arrived today reporting that she feels better after the last 2 Rxs and that her LBP  is not constant now. She reports waking without pain this morning, but now it is 5/10. She did well with Rx and was issued an HEP for core activation exs. Pt reports pain in LT hip from her bursitis and LT knee pain due to bone on bone.. Normal modality response.. Try bridging next Rx    Clinical Presentation  Evolving    Rehab Potential  Good    PT Frequency  2x / week    PT Duration  6 weeks    PT Treatment/Interventions  ADLs/Self Care Home Management;Cryotherapy;Electrical Stimulation;Ultrasound;Moist Heat;Therapeutic activities;Therapeutic exercise;Patient/family education;Manual techniques    PT Next Visit Plan  No spinal loading; HMP, Modalities and STW/M to low back; low-level core exercises (ie:  Hip bridges).    Consulted and Agree with Plan of Care  Patient       Patient will benefit from skilled therapeutic intervention in order to improve the following deficits and impairments:  Abnormal gait, Decreased activity tolerance, Postural dysfunction, Pain  Visit Diagnosis: Chronic bilateral low back pain without sciatica  Abnormal posture     Problem List Patient Active Problem List   Diagnosis Date Noted  . Primary osteoarthritis of left knee 11/02/2017  . Iron deficiency anemia 04/28/2017  . Vitamin D deficiency 04/28/2017  . Osteopenia 04/28/2017  . Gastritis, erosive   . Abdominal pain, epigastric 12/12/2016  . Colon cancer screening 11/03/2016  . Essential hypertension 11/03/2016  . Primary osteoarthritis of both knees 11/03/2016  . GERD (gastroesophageal reflux disease) 11/03/2016  . Age-related osteoporosis without current pathological fracture 11/03/2016  . Perforation of sigmoid colon (HCC) 06/29/2016    Tacara Hadlock,CHRIS, PTA 01/12/2018, 2:00 PM  [facility] [address] [place], [place], [address] Phone: [phone]   Fax:  [phone]  Name: Tiffany Bradley MRN: [id number] Date of Birth: [birth year]

## 2018-01-12 NOTE — Patient Instructions (Signed)

## 2018-01-14 ENCOUNTER — Ambulatory Visit: Payer: Medicare HMO | Admitting: *Deleted

## 2018-01-14 DIAGNOSIS — G8929 Other chronic pain: Secondary | ICD-10-CM

## 2018-01-14 DIAGNOSIS — M545 Low back pain: Secondary | ICD-10-CM | POA: Diagnosis not present

## 2018-01-14 DIAGNOSIS — R293 Abnormal posture: Secondary | ICD-10-CM | POA: Diagnosis not present

## 2018-01-14 NOTE — Therapy (Signed)
[facility] [address] [place], [place], [address] Phone: [phone]   Fax:  [phone]  Physical Therapy Treatment  Patient Details  Name: Tiffany Bradley MRN: [id number] Date of Birth: 1945/10/20 Referring Provider: Fuller Canada MD   Encounter Date: 01/14/2018  PT End of Session - 01/14/18 1315    Visit Number  4    Number of Visits  12    Date for PT Re-Evaluation  03/02/18    Authorization Type  10TH VISIT PROGRESS NOTE AND KX MODIFIER AFTER THE 15 VISIT.    MD 01-27-18    PT Start Time  1306    PT Stop Time  1355    PT Time Calculation (min)  49 min    Activity Tolerance  Patient tolerated treatment well    Behavior During Therapy  WFL for tasks assessed/performed       Past Medical History:  Diagnosis Date  . Arthritis   . Back pain   . Cataract   . DJD (degenerative joint disease)   . Full dentures   . GERD (gastroesophageal reflux disease)   . Glaucoma   . History of bleeding ulcers    patient reports multiple hospitalizations remotely  . Hypertension   . Wears glasses     Past Surgical History:  Procedure Laterality Date  . BREAST REDUCTION SURGERY Bilateral 04/25/2013   Procedure: MAMMARY REDUCTION  (BREAST);  Surgeon: Louisa Second, MD;  Location: [facility];  Service: Plastics;  Laterality: Bilateral;  . COLONOSCOPY     2008, normal per patient  . COLONOSCOPY N/A 01/16/2017   Procedure: COLONOSCOPY;  Surgeon: West Bali, MD;  [facility];  Service: Endoscopy;  Laterality: N/A;  830   . COSMETIC SURGERY    . ESOPHAGOGASTRODUODENOSCOPY N/A 01/16/2017   Procedure: ESOPHAGOGASTRODUODENOSCOPY (EGD);  Surgeon: West Bali, MD;  [facility];  Service: Endoscopy;  Laterality: N/A;  . PARTIAL COLECTOMY N/A 06/29/2016   Procedure: PARTIAL COLECTOMY;  Surgeon: Ancil Linsey, MD;  Location: [facility];  Service: General;  Laterality: N/A;  . TUBAL LIGATION      There  were no vitals filed for this visit.  Subjective Assessment - 01/14/18 1312    Subjective   Felt better after last Rx. Only 5/10 today. Pain is not all day now    Pertinent History  OP; DJD;     Limitations  Standing    How long can you stand comfortably?  10 minutes.    How long can you walk comfortably?  Short community distances.    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    Currently in Pain?  Yes    Pain Score  5     Pain Orientation  Right    Pain Descriptors / Indicators  Aching    Pain Type  Chronic pain    Pain Onset  More than a month ago    Pain Frequency  Constant                       [facility] Adult PT Treatment/Exercise - 01/14/18 0001      Exercises   Exercises  Lumbar      Lumbar Exercises: Supine   Ab Set  10 reps;5 seconds    Bent Knee Raise  10 reps;3 seconds    Bridge  10 reps;3 seconds    Bridge Limitations  LT knee pain  Moist Heat Therapy   Number Minutes Moist Heat  15 Minutes    Moist Heat Location  Lumbar Spine      Electrical Stimulation   Electrical Stimulation Location  Lower lumbar. IFC 80-150hz  x 15 mins    Electrical Stimulation Goals  Pain      Ultrasound   Ultrasound Location  1.5 w/cm2 x 10 mins LB paras    Ultrasound Parameters  RT sidelying    Ultrasound Goals  Pain      Manual Therapy   Manual Therapy  Soft tissue mobilization    Soft tissue mobilization  STW to bil paras in RT sidelying and LT QL               PT Short Term Goals - 01/05/18 1358      PT SHORT TERM GOAL #1   Title  STG's= LTG's.        PT Long Term Goals - 01/05/18 1358      PT LONG TERM GOAL #1   Title  Independent with a HEP.    Time  6    Period  Weeks    Status  New      PT LONG TERM GOAL #2   Title  Stand 20 minutes with pain not > 3/10.    Time  6    Period  Weeks    Status  New      PT LONG TERM GOAL #3   Title  Perform ADL's with pain not > 3/10.    Period  Weeks    Status  New             Plan - 01/14/18 1404    Clinical Presentation  Evolving    Clinical Decision Making  Moderate    Rehab Potential  Good    PT Frequency  2x / week    PT Duration  6 weeks    PT Treatment/Interventions  ADLs/Self Care Home Management;Cryotherapy;Electrical Stimulation;Ultrasound;Moist Heat;Therapeutic activities;Therapeutic exercise;Patient/family education;Manual techniques    PT Next Visit Plan  No spinal loading; HMP, Modalities and STW/M to low back; low-level core exercises (ie:  Hip bridges).    Consulted and Agree with Plan of Care  Patient       Patient will benefit from skilled therapeutic intervention in order to improve the following deficits and impairments:  Abnormal gait, Decreased activity tolerance, Postural dysfunction, Pain  Visit Diagnosis: Chronic bilateral low back pain without sciatica  Abnormal posture     Problem List Patient Active Problem List   Diagnosis Date Noted  . Primary osteoarthritis of left knee 11/02/2017  . Iron deficiency anemia 04/28/2017  . Vitamin D deficiency 04/28/2017  . Osteopenia 04/28/2017  . Gastritis, erosive   . Abdominal pain, epigastric 12/12/2016  . Colon cancer screening 11/03/2016  . Essential hypertension 11/03/2016  . Primary osteoarthritis of both knees 11/03/2016  . GERD (gastroesophageal reflux disease) 11/03/2016  . Age-related osteoporosis without current pathological fracture 11/03/2016  . Perforation of sigmoid colon (HCC) 06/29/2016    RAMSEUR,CHRIS, PTA 01/14/2018, 6:29 PM  [facility] [address] [place], [place], [address] Phone: [phone]   Fax:  [phone]  Name: Tiffany Bradley MRN: [id number] Date of Birth: 1946/07/18

## 2018-01-19 ENCOUNTER — Ambulatory Visit: Payer: Medicare HMO | Admitting: Physical Therapy

## 2018-01-19 ENCOUNTER — Encounter: Payer: Self-pay | Admitting: Physical Therapy

## 2018-01-19 DIAGNOSIS — M545 Low back pain: Principal | ICD-10-CM

## 2018-01-19 DIAGNOSIS — G8929 Other chronic pain: Secondary | ICD-10-CM

## 2018-01-19 DIAGNOSIS — R293 Abnormal posture: Secondary | ICD-10-CM | POA: Diagnosis not present

## 2018-01-19 NOTE — Therapy (Addendum)
[facility] [address] [place], [place], [address] Phone: [phone]   Fax:  [phone]  Physical Therapy Treatment  Patient Details  Name: Tiffany Bradley MRN: [id number] Date of Birth: [birth year] Referring Provider: Fuller Canada MD   Encounter Date: 01/19/2018  PT End of Session - 01/19/18 1341    Visit Number  5    Number of Visits  12    Date for PT Re-Evaluation  03/02/18    Authorization Type  10TH VISIT PROGRESS NOTE AND KX MODIFIER AFTER THE 15 VISIT.    MD 01-27-18    PT Start Time  0101    PT Stop Time  0155    PT Time Calculation (min)  54 min    Activity Tolerance  Patient tolerated treatment well    Behavior During Therapy  [facility] for tasks assessed/performed       Past Medical History:  Diagnosis Date  . Arthritis   . Back pain   . Cataract   . DJD (degenerative joint disease)   . Full dentures   . GERD (gastroesophageal reflux disease)   . Glaucoma   . History of bleeding ulcers    patient reports multiple hospitalizations remotely  . Hypertension   . Wears glasses     Past Surgical History:  Procedure Laterality Date  . BREAST REDUCTION SURGERY Bilateral 04/25/2013   Procedure: MAMMARY REDUCTION  (BREAST);  Surgeon: Louisa Second, MD;  Location: [facility];  Service: Plastics;  Laterality: Bilateral;  . COLONOSCOPY     2008, normal per patient  . COLONOSCOPY N/A 01/16/2017   Procedure: COLONOSCOPY;  Surgeon: West Bali, MD;  [facility];  Service: Endoscopy;  Laterality: N/A;  830   . COSMETIC SURGERY    . ESOPHAGOGASTRODUODENOSCOPY N/A 01/16/2017   Procedure: ESOPHAGOGASTRODUODENOSCOPY (EGD);  Surgeon: West Bali, MD;  [facility];  Service: Endoscopy;  Laterality: N/A;  . PARTIAL COLECTOMY N/A 06/29/2016   Procedure: PARTIAL COLECTOMY;  Surgeon: Ancil Linsey, MD;  [facility];  Service: General;  Laterality: N/A;  . TUBAL LIGATION      There  were no vitals filed for this visit.  Subjective Assessment - 01/19/18 1339    Subjective  The treatments are helping.    Currently in Pain?  Yes    Pain Score  5     Pain Location  Back    Pain Orientation  Right    Pain Descriptors / Indicators  Aching                       [facility] Adult PT Treatment/Exercise - 01/19/18 0001      Exercises   Exercises  Lumbar      Moist Heat Therapy   Number Minutes Moist Heat  20 Minutes    Moist Heat Location  Lumbar Spine      Electrical Stimulation   Electrical Stimulation Location  lower lumbar    Electrical Stimulation Action  IFC    Electrical Stimulation Parameters  80-150 hz x 20 minutes.    Electrical Stimulation Goals  Pain      Ultrasound   Ultrasound Location  Right sdly position with folded pillow between knees.    Ultrasound Parameters  U/S at 1.50 w/CM2 x 12 minutes.    Ultrasound Goals  Pain      Manual Therapy   Manual Therapy  Soft tissue mobilization    Soft tissue mobilization  STW/M x 12 minutes to lower lumbar musculature.               PT Short Term Goals - 01/05/18 1358      PT SHORT TERM GOAL #1   Title  STG's= LTG's.        PT Long Term Goals - 01/05/18 1358      PT LONG TERM GOAL #1   Title  Independent with a HEP.    Time  6    Period  Weeks    Status  New      PT LONG TERM GOAL #2   Title  Stand 20 minutes with pain not > 3/10.    Time  6    Period  Weeks    Status  New      PT LONG TERM GOAL #3   Title  Perform ADL's with pain not > 3/10.    Period  Weeks    Status  New              Patient will benefit from skilled therapeutic intervention in order to improve the following deficits and impairments:     Visit Diagnosis: Chronic bilateral low back pain without sciatica  Abnormal posture     Problem List Patient Active Problem List   Diagnosis Date Noted  . Primary osteoarthritis of left knee 11/02/2017  . Iron deficiency anemia 04/28/2017  .  Vitamin D deficiency 04/28/2017  . Osteopenia 04/28/2017  . Gastritis, erosive   . Abdominal pain, epigastric 12/12/2016  . Colon cancer screening 11/03/2016  . Essential hypertension 11/03/2016  . Primary osteoarthritis of both knees 11/03/2016  . GERD (gastroesophageal reflux disease) 11/03/2016  . Age-related osteoporosis without current pathological fracture 11/03/2016  . Perforation of sigmoid colon (HCC) 06/29/2016    Eldena Dede, [place] MPT 01/19/2018, 2:12 PM  [facility] [address] [place], [place], [address] Phone: [phone]   Fax:  [phone]  Name: Tiffany Bradley MRN: [id number] Date of Birth: 01-25-1946

## 2018-01-21 ENCOUNTER — Encounter: Payer: Medicare HMO | Admitting: *Deleted

## 2018-01-26 ENCOUNTER — Ambulatory Visit: Payer: Medicare HMO | Admitting: *Deleted

## 2018-01-26 DIAGNOSIS — G8929 Other chronic pain: Secondary | ICD-10-CM

## 2018-01-26 DIAGNOSIS — M545 Low back pain: Secondary | ICD-10-CM | POA: Diagnosis not present

## 2018-01-26 DIAGNOSIS — R293 Abnormal posture: Secondary | ICD-10-CM | POA: Diagnosis not present

## 2018-01-26 NOTE — Therapy (Signed)
[facility] [address] [place], [place], [address] Phone: [phone]   Fax:  [phone]  Physical Therapy Treatment  Patient Details  Name: Tiffany Bradley MRN: [id number] Date of Birth: 12/24/1945 Referring Provider: Stanley Harrison MD   Encounter Date: 01/26/2018  PT End of Session - 01/26/18 1321    Visit Number  6    Number of Visits  12    Date for PT Re-Evaluation  03/02/18    Authorization Type  10TH VISIT PROGRESS NOTE AND KX MODIFIER AFTER THE 15 VISIT.    MD 01-27-18    PT Start Time  1305    PT Stop Time  1351    PT Time Calculation (min)  46 min       Past Medical History:  Diagnosis Date  . Arthritis   . Back pain   . Cataract   . DJD (degenerative joint disease)   . Full dentures   . GERD (gastroesophageal reflux disease)   . Glaucoma   . History of bleeding ulcers    patient reports multiple hospitalizations remotely  . Hypertension   . Wears glasses     Past Surgical History:  Procedure Laterality Date  . BREAST REDUCTION SURGERY Bilateral 04/25/2013   Procedure: MAMMARY REDUCTION  (BREAST);  Surgeon: Gerald Truesdale, MD;  Location: [facility];  Service: Plastics;  Laterality: Bilateral;  . COLONOSCOPY     2008, normal per patient  . COLONOSCOPY N/A 01/16/2017   Procedure: COLONOSCOPY;  Surgeon: Fields, Sandi L, MD;  [facility];  Service: Endoscopy;  Laterality: N/A;  830   . COSMETIC SURGERY    . ESOPHAGOGASTRODUODENOSCOPY N/A 01/16/2017   Procedure: ESOPHAGOGASTRODUODENOSCOPY (EGD);  Surgeon: Fields, Sandi L, MD;  [facility];  Service: Endoscopy;  Laterality: N/A;  . PARTIAL COLECTOMY N/A 06/29/2016   Procedure: PARTIAL COLECTOMY;  Surgeon: Jason Evan Davis, MD;  Location[facility];  Service: General;  Laterality: N/A;  . TUBAL LIGATION      There were no vitals filed for this visit.  Subjective Assessment - 01/26/18 1322    Subjective  The treatments are  helping.    Pertinent History  OP; DJD;     Limitations  Standing    How long can you stand comfortably?  10 minutes.    How long can you walk comfortably?  Short community distances.    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    Currently in Pain?  Yes    Pain Score  6     Pain Location  Back    Pain Orientation  Right    Pain Descriptors / Indicators  Aching    Pain Type  Chronic pain    Pain Onset  More than a month ago    Pain Frequency  Constant                       [facility] Adult PT Treatment/Exercise - 01/26/18 0001      Exercises   Exercises  Lumbar      Moist Heat Therapy   Number Minutes Moist Heat  15 Minutes    Moist Heat Location  Lumbar Spine      Electrical Stimulation   Electrical Stimulation Location  Lower lumbar. IFC 80-150hz x 15 mins    Electrical Stimulation Goals  Pain      Ultrasound   Ultrasound Location  RT sidelying, Bil LB   paras    Ultrasound Parameters  1.5 w/cm2 x 12 mins    Ultrasound Goals  Pain      Manual Therapy   Manual Therapy  Soft tissue mobilization    Soft tissue mobilization  STW/M x 12 minutes to lower lumbar musculature.               PT Short Term Goals - 01/05/18 1358      PT SHORT TERM GOAL #1   Title  STG's= LTG's.        PT Long Term Goals - 01/26/18 1345      PT LONG TERM GOAL #1   Title  Independent with a HEP.    Period  Weeks    Status  Achieved      PT LONG TERM GOAL #2   Title  Stand 20 minutes with pain not > 3/10.    Time  6    Period  Weeks    Status  Achieved      PT LONG TERM GOAL #3   Period  Weeks    Status  Partially Met yard work 5-6/10            Plan - 01/26/18 1347    Clinical Impression Statement  Pt arrived today reporting that she feels 50% better since starting PT. She has met several LTGs and feels that she does fairly well unless she does some yard work and then her painlevels go up to5-6/10. Her LT knee causes her more  pain and prevents her from performing some act's.    Clinical Presentation  Evolving    PT Frequency  2x / week    PT Duration  6 weeks    PT Treatment/Interventions  ADLs/Self Care Home Management;Cryotherapy;Electrical Stimulation;Ultrasound;Moist Heat;Therapeutic activities;Therapeutic exercise;Patient/family education;Manual techniques    PT Next Visit Plan  No spinal loading; HMP, Modalities and STW/M to low back; low-level core exercises (ie:  Hip bridges).    Consulted and Agree with Plan of Care  Patient       Patient will benefit from skilled therapeutic intervention in order to improve the following deficits and impairments:  Abnormal gait, Decreased activity tolerance, Postural dysfunction, Pain  Visit Diagnosis: Chronic bilateral low back pain without sciatica  Abnormal posture     Problem List Patient Active Problem List   Diagnosis Date Noted  . Primary osteoarthritis of left knee 11/02/2017  . Iron deficiency anemia 04/28/2017  . Vitamin D deficiency 04/28/2017  . Osteopenia 04/28/2017  . Gastritis, erosive   . Abdominal pain, epigastric 12/12/2016  . Colon cancer screening 11/03/2016  . Essential hypertension 11/03/2016  . Primary osteoarthritis of both knees 11/03/2016  . GERD (gastroesophageal reflux disease) 11/03/2016  . Age-related osteoporosis without current pathological fracture 11/03/2016  . Perforation of sigmoid colon (HCC) 06/29/2016    ,CHRIS,PTA 01/26/2018, 1:52 PM  [facility] [address] [place], [place], [address] Phone: [phone]   Fax:  [phone]  Name: Sumaiyah Piper MRN: [id number] Date of Birth: 04/30/1946   

## 2018-01-27 ENCOUNTER — Encounter: Payer: Self-pay | Admitting: Orthopedic Surgery

## 2018-01-27 ENCOUNTER — Ambulatory Visit: Payer: Medicare HMO | Admitting: Orthopedic Surgery

## 2018-01-27 VITALS — BP 148/96 | HR 79 | Ht 63.0 in | Wt 168.0 lb

## 2018-01-27 DIAGNOSIS — M170 Bilateral primary osteoarthritis of knee: Secondary | ICD-10-CM | POA: Diagnosis not present

## 2018-01-27 DIAGNOSIS — M4316 Spondylolisthesis, lumbar region: Secondary | ICD-10-CM

## 2018-01-27 NOTE — Progress Notes (Addendum)
PREOP   Chief Complaint  Patient presents with  . Back Pain    feels better after going for physical therapy     MEDICAL DECISION SECTION  xrays ordered? no  My independent reading of xrays: Previous x-rays show severe valgus deformity of the left knee severe arthritis in the lateral compartment  She has improved in terms of her back pain   Encounter Diagnoses  Name Primary?  . Primary osteoarthritis of both knees Yes  . Spondylolisthesis at L4-L5 level     Osteoarthritis right wrist both knees left knee worse  Spondylolisthesis and back pain has improved PLAN:   The risks and benefits of surgery have been explained to the patient she agrees to accept those risks specific to her situation include the following she was advised in the potential postoperative complication of foot drop which could last for 6 months and required AFO bracing  She lives at home with her daughter and 2 grandchildren.  She has [employer] and will probably have to go home after surgery.  She says that she can no longer take the pain in her knee as it is keeping her up at night and preventing her from activity and exercise   Surgical procedure planned: Left total knee arthroplasty  The procedure has been fully reviewed with the patient; The risks and benefits of surgery have been discussed and explained and understood. Alternative treatment has also been reviewed, questions were encouraged and answered. The postoperative plan is also been reviewed.  Nonsurgical treatment as described in the history and physical section was attempted and unsuccessful and the patient has agreed to proceed with surgical intervention to improve their situation.  No orders of the defined types were placed in this encounter.      Chief Complaint  Patient presents with  . Back Pain    feels better after going for physical therapy     43 old female bilateral knee arthritis followed and even became unhappy because  she was not getting surgery when they said she would presented test with bilateral knee pain worsening deformity of the left lower extremity pain in her back in both legs left worse than right difficulty getting out of a chair trouble walking.  Pain was unrelieved by diclofenac 75 and glucosamine/chondroitin  She complained of activities of daily living becoming more difficult and pain standing for long periods of time     Review of Systems  Musculoskeletal: Positive for back pain and joint pain.  All other systems reviewed and are negative.    Past Medical History:  Diagnosis Date  . Arthritis   . Back pain   . Cataract   . DJD (degenerative joint disease)   . Full dentures   . GERD (gastroesophageal reflux disease)   . Glaucoma   . History of bleeding ulcers    patient reports multiple hospitalizations remotely  . Hypertension   . Wears glasses     Past Surgical History:  Procedure Laterality Date  . BREAST REDUCTION SURGERY Bilateral 04/25/2013   Procedure: MAMMARY REDUCTION  (BREAST);  Surgeon: Louisa Second, MD;  Location: [facility];  Service: Plastics;  Laterality: Bilateral;  . COLONOSCOPY     2008, normal per patient  . COLONOSCOPY N/A 01/16/2017   Procedure: COLONOSCOPY;  Surgeon: West Bali, MD;  [facility];  Service: Endoscopy;  Laterality: N/A;  830   . COSMETIC SURGERY    . ESOPHAGOGASTRODUODENOSCOPY N/A 01/16/2017   Procedure: ESOPHAGOGASTRODUODENOSCOPY (  EGD);  Surgeon: West Bali, MD;  [facility];  Service: Endoscopy;  Laterality: N/A;  . PARTIAL COLECTOMY N/A 06/29/2016   Procedure: PARTIAL COLECTOMY;  Surgeon: Ancil Linsey, MD;  [facility];  Service: General;  Laterality: N/A;  . TUBAL LIGATION      Family History  Problem Relation Age of Onset  . Kidney disease Mother   . Stroke Mother   . Heart disease Father   . Colon cancer Neg Hx    Social History   Tobacco Use  . Smoking status:  Former Smoker    Types: Cigarettes    Last attempt to quit: 04/20/1993    Years since quitting: 24.7  . Smokeless tobacco: Never Used  Substance Use Topics  . Alcohol use: Yes    Comment: rare  . Drug use: No    No Known Allergies  Current Meds  Medication Sig  . amLODipine (NORVASC) 10 MG tablet Take 1 tablet (10 mg total) by mouth daily.  . diclofenac (VOLTAREN) 75 MG EC tablet Take 1 tablet (75 mg total) by mouth 2 (two) times daily.  . Glucosamine HCl (GLUCOSAMINE PO) Take 2 tablets by mouth daily.  . Multiple Vitamins-Minerals (MULTI-VITAMIN GUMMIES PO) Take 2 each by mouth daily.   Marland Kitchen omeprazole (PRILOSEC) 40 MG capsule Take 1 capsule (40 mg total) by mouth daily.  . sucralfate (CARAFATE) 1 g tablet Take 1 g by mouth 4 (four) times daily -  with meals and at bedtime.   Marland Kitchen tiZANidine (ZANAFLEX) 4 MG tablet Take 1 tablet (4 mg total) by mouth at bedtime as needed for muscle spasms.  . Vitamin D, Ergocalciferol, (DRISDOL) 50000 units CAPS capsule Take 50,000 Units by mouth every 7 (seven) days.    BP (!) 148/96   Pulse 79   Ht  (1.6 m)   Wt 168 lb (76.2 kg)   BMI 29.76 kg/m   Physical Exam She is well-developed well-nourished oriented to person place and time alert and oriented x3 mood and affect are normal normal mood and affect are noted with normal judgment  Vitals are stable Ortho Exam  Left knee she has valgus alignment fairly severe flexion only 95 degrees 10 degree flexion contracture she has a pseudo-laxity of the varus valgus plane with firm endpoints no AP laxity muscle tone is normal skin is intact quadriceps function is normal there are no rashes ulcerations or lacerations or erythema to the skin pulses normal temperature normal no edema or swelling normal sensation and reflexes with negative straight leg raise on the left  On the right side she has a mild valgus deformity with 5 to 115 degrees of flexion contracture with no laxity in the sagittal coronal  plane muscle strength and tone are normal skin is intact without erythema pulse and perfusion are normal sensation and reflexes are normal  Fuller Canada, MD 01/27/2018 11:11 AM

## 2018-01-27 NOTE — Patient Instructions (Signed)
You have decided to proceed with knee replacement surgery. You have decided not to continue with nonoperative measures such as but not limited to oral medication, weight loss, activity modification, physical therapy, bracing, or injection.  We will perform the procedure commonly known as total knee replacement. Some of the risks associated with knee replacement surgery include but are not limited to Bleeding Infection Swelling Stiffness Blood clot Pain that persists even after surgery  Infection is especially devastating complication of knee surgery although rare. If infection does occur your implant will usually have to be removed and several surgeries and antibiotics will be needed to eradicate the infection prior to performing a repeat replacement.   In some cases amputation is required to eradicate the infection. In other rare cases a knee fusion is needed    If you're not comfortable with these risks and would like to continue with nonoperative treatment please let Dr. Seeley Southgate know prior to your surgery.  

## 2018-01-28 ENCOUNTER — Encounter: Payer: Self-pay | Admitting: Physical Therapy

## 2018-01-28 ENCOUNTER — Ambulatory Visit: Payer: Medicare HMO | Admitting: Physical Therapy

## 2018-01-28 DIAGNOSIS — G8929 Other chronic pain: Secondary | ICD-10-CM

## 2018-01-28 DIAGNOSIS — R293 Abnormal posture: Secondary | ICD-10-CM

## 2018-01-28 DIAGNOSIS — M545 Low back pain: Secondary | ICD-10-CM | POA: Diagnosis not present

## 2018-01-28 NOTE — Patient Instructions (Signed)

## 2018-01-28 NOTE — Therapy (Signed)
[facility] [place], [place], [address] Phone: [phone]   Fax:  [phone]  Physical Therapy Treatment  Patient Details  Name: Tiffany Bradley MRN: [id number] Date of Birth: 1945-09-12 Referring Provider: Arther Abbott MD   Encounter Date: 01/28/2018  PT End of Session - 01/28/18 1315    Visit Number  7    Number of Visits  12    Date for PT Re-Evaluation  03/02/18    Authorization Type  10TH VISIT PROGRESS NOTE AND KX MODIFIER AFTER THE 15 VISIT.    MD 01-27-18    PT Start Time  1300    PT Stop Time  1347    PT Time Calculation (min)  47 min    Activity Tolerance  Patient tolerated treatment well    Behavior During Therapy  WFL for tasks assessed/performed       Past Medical History:  Diagnosis Date  . Arthritis   . Back pain   . Cataract   . DJD (degenerative joint disease)   . Full dentures   . GERD (gastroesophageal reflux disease)   . Glaucoma   . History of bleeding ulcers    patient reports multiple hospitalizations remotely  . Hypertension   . Wears glasses     Past Surgical History:  Procedure Laterality Date  . BREAST REDUCTION SURGERY Bilateral 04/25/2013   Procedure: MAMMARY REDUCTION  (BREAST);  Surgeon: Cristine Polio, MD;  Location: [facility];  Service: Plastics;  Laterality: Bilateral;  . COLONOSCOPY     2008, normal per patient  . COLONOSCOPY N/A 01/16/2017   Procedure: COLONOSCOPY;  Surgeon: Danie Binder, MD;  [facility];  Service: Endoscopy;  Laterality: N/A;  830   . COSMETIC SURGERY    . ESOPHAGOGASTRODUODENOSCOPY N/A 01/16/2017   Procedure: ESOPHAGOGASTRODUODENOSCOPY (EGD);  Surgeon: Danie Binder, MD;  [facility];  Service: Endoscopy;  Laterality: N/A;  . PARTIAL COLECTOMY N/A 06/29/2016   Procedure: PARTIAL COLECTOMY;  Surgeon: Vickie Epley, MD;  [facility];  Service: General;  Laterality: N/A;  . TUBAL LIGATION      There  were no vitals filed for this visit.  Subjective Assessment - 01/28/18 1306    Subjective  Patient reported doing good overall    Pertinent History  OP; DJD;     Limitations  Standing    How long can you stand comfortably?  10 minutes.    How long can you walk comfortably?  Short community distances.    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    Currently in Pain?  Yes    Pain Score  5     Pain Location  Back    Pain Orientation  Right;Left;Lower    Pain Descriptors / Indicators  Aching    Pain Type  Chronic pain    Pain Onset  More than a month ago    Pain Frequency  Constant    Aggravating Factors   prolong activity    Pain Relieving Factors  rest                       [facility] Adult PT Treatment/Exercise - 01/28/18 0001      Self-Care   Self-Care  Lifting;ADL's;Posture;Other Self-Care Comments HEP given for all the above      Moist Heat Therapy   Number Minutes Moist Heat  15 Minutes    Moist Heat  Location  Lumbar Spine      Electrical Stimulation   Electrical Stimulation Location  Lower lumbar. IFC 80-'150hz'  x 15 mins    Electrical Stimulation Goals  Pain      Ultrasound   Ultrasound Location  bil low back paraspinals right sidelying    Ultrasound Parameters  1.5w/cm2/50%/61mz x169m    Ultrasound Goals  Pain      Manual Therapy   Manual Therapy  Soft tissue mobilization;Myofascial release    Soft tissue mobilization  manual STW/MFR to lower lumbar paraspinals              PT Education - 01/28/18 1315    Education Details  HEP    Person(s) Educated  Patient    Methods  Explanation;Demonstration;Handout    Comprehension  Verbalized understanding;Returned demonstration       PT Short Term Goals - 01/05/18 1358      PT SHORT TERM GOAL #1   Title  STG's= LTG's.        PT Long Term Goals - 01/28/18 1319      PT LONG TERM GOAL #1   Title  Independent with a HEP.    Time  6    Period  Weeks    Status   Achieved      PT LONG TERM GOAL #2   Title  Stand 20 minutes with pain not > 3/10.    Time  6    Period  Weeks    Status  Achieved      PT LONG TERM GOAL #3   Title  Perform ADL's with pain not > 3/10.    Time  6    Period  Weeks    Status  Partially Met 5-6/10 pain5/30/19 with yard work            Plan - 01/28/18 1320    Clinical Impression Statement  Patient tolerated treatment well today. Patient reported feeling overall improvement yet ongoing discomfort with prolong activity/ADL's esp yard work. Patient educated on posture awareness techniques with HEP provided. Patient reported doing HEP daily and will incorporated posture techniques to help avoid further discomfort. Remaining goal ongoing.     Rehab Potential  Good    PT Frequency  2x / week    PT Duration  6 weeks    PT Treatment/Interventions  ADLs/Self Care Home Management;Cryotherapy;Electrical Stimulation;Ultrasound;Moist Heat;Therapeutic activities;Therapeutic exercise;Patient/family education;Manual techniques    PT Next Visit Plan  No spinal loading; HMP, Modalities and STW/M to low back; low-level core exercises (ie:  Hip bridges).    Consulted and Agree with Plan of Care  Patient       Patient will benefit from skilled therapeutic intervention in order to improve the following deficits and impairments:  Abnormal gait, Decreased activity tolerance, Postural dysfunction, Pain  Visit Diagnosis: Chronic bilateral low back pain without sciatica  Abnormal posture     Problem List Patient Active Problem List   Diagnosis Date Noted  . Primary osteoarthritis of left knee 11/02/2017  . Iron deficiency anemia 04/28/2017  . Vitamin D deficiency 04/28/2017  . Osteopenia 04/28/2017  . Gastritis, erosive   . Abdominal pain, epigastric 12/12/2016  . Colon cancer screening 11/03/2016  . Essential hypertension 11/03/2016  . Primary osteoarthritis of both knees 11/03/2016  . GERD (gastroesophageal reflux disease)  11/03/2016  . Age-related osteoporosis without current pathological fracture 11/03/2016  . Perforation of sigmoid colon ([facility]/29/2017    DUNFORD, CHRISTINA P, PTA 01/28/2018, 2:07 PM  [facility] [place], [place], [address] Phone: [phone]   Fax:  [phone]  Name: Tiffany Bradley MRN: [id number] Date of Birth: 1946/05/05

## 2018-01-29 ENCOUNTER — Telehealth: Payer: Self-pay | Admitting: *Deleted

## 2018-01-29 ENCOUNTER — Telehealth: Payer: Self-pay | Admitting: Orthopedic Surgery

## 2018-01-29 NOTE — Addendum Note (Signed)
Addended byCaffie Damme: Burl Tauzin W on: 01/29/2018 11:31 AM   Modules accepted: Orders, SmartSet

## 2018-01-29 NOTE — Telephone Encounter (Signed)
Osteoporosis with recommendation for Prolia. 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

## 2018-01-29 NOTE — Telephone Encounter (Signed)
Tiffany Bradley called and wanted to know if her surgery has been scheduled.  She asked that you please give her a call back regarding this  Thanks

## 2018-01-29 NOTE — Telephone Encounter (Signed)
Spoke to her Dr Romeo AppleHarrison has sch for June 13th

## 2018-02-02 ENCOUNTER — Encounter: Payer: Self-pay | Admitting: Physical Therapy

## 2018-02-02 ENCOUNTER — Ambulatory Visit: Payer: Medicare HMO | Attending: Orthopedic Surgery | Admitting: Physical Therapy

## 2018-02-02 ENCOUNTER — Other Ambulatory Visit: Payer: Self-pay | Admitting: Radiology

## 2018-02-02 DIAGNOSIS — M170 Bilateral primary osteoarthritis of knee: Secondary | ICD-10-CM

## 2018-02-02 DIAGNOSIS — R293 Abnormal posture: Secondary | ICD-10-CM | POA: Insufficient documentation

## 2018-02-02 DIAGNOSIS — M545 Low back pain: Secondary | ICD-10-CM | POA: Diagnosis not present

## 2018-02-02 DIAGNOSIS — G8929 Other chronic pain: Secondary | ICD-10-CM | POA: Insufficient documentation

## 2018-02-02 NOTE — Therapy (Signed)
[facility] [place], [place], [address] Phone: [phone]   Fax:  [phone]  Physical Therapy Treatment  Patient Details  Name: Tiffany Bradley MRN: [id number] Date of Birth: 15-Feb-1946 Referring Provider: Arther Abbott MD   Encounter Date: 02/02/2018  PT End of Session - 02/02/18 1424    Visit Number  8    Number of Visits  12    Date for PT Re-Evaluation  03/02/18    Authorization Type  10TH VISIT PROGRESS NOTE AND KX MODIFIER AFTER THE 15 VISIT.    MD 01-27-18    PT Start Time  1345    PT Stop Time  1440    PT Time Calculation (min)  55 min    Activity Tolerance  Patient tolerated treatment well    Behavior During Therapy  WFL for tasks assessed/performed       Past Medical History:  Diagnosis Date  . Arthritis   . Back pain   . Cataract   . DJD (degenerative joint disease)   . Full dentures   . GERD (gastroesophageal reflux disease)   . Glaucoma   . History of bleeding ulcers    patient reports multiple hospitalizations remotely  . Hypertension   . Wears glasses     Past Surgical History:  Procedure Laterality Date  . BREAST REDUCTION SURGERY Bilateral 04/25/2013   Procedure: MAMMARY REDUCTION  (BREAST);  Surgeon: Cristine Polio, MD;  Location: [facility];  Service: Plastics;  Laterality: Bilateral;  . COLONOSCOPY     2008, normal per patient  . COLONOSCOPY N/A 01/16/2017   Procedure: COLONOSCOPY;  Surgeon: Danie Binder, MD;  [facility];  Service: Endoscopy;  Laterality: N/A;  830   . COSMETIC SURGERY    . ESOPHAGOGASTRODUODENOSCOPY N/A 01/16/2017   Procedure: ESOPHAGOGASTRODUODENOSCOPY (EGD);  Surgeon: Danie Binder, MD;  [facility];  Service: Endoscopy;  Laterality: N/A;  . PARTIAL COLECTOMY N/A 06/29/2016   Procedure: PARTIAL COLECTOMY;  Surgeon: Vickie Epley, MD;  [facility];  Service: General;  Laterality: N/A;  . TUBAL LIGATION      There  were no vitals filed for this visit.  Subjective Assessment - 02/02/18 1424    Subjective  Patient reported feeling good.     Pertinent History  OP; DJD;     Limitations  Standing    How long can you stand comfortably?  10 minutes.    How long can you walk comfortably?  Short community distances.    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    Currently in Pain?  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                   [facility] Adult PT Treatment/Exercise - 02/02/18 0001      Electrical Stimulation   Electrical Stimulation Location  Lower lumbar. IFC 80-_0  x 15 mins    Electrical Stimulation Goals  Pain  Ultrasound   Ultrasound Location  bilateral lumbar paraspinals, right sidelying    Ultrasound Parameters  1.5 w/cm2, 100%, 79mz, x12 min    Ultrasound Goals  Pain      Manual Therapy   Manual Therapy  Soft tissue mobilization    Soft tissue mobilization  manual STW/MFR to lower lumbar paraspinals and QL to reduce pain               PT Short Term Goals - 01/05/18 1358      PT SHORT TERM GOAL #1   Title  STG's= LTG's.        PT Long Term Goals - 01/28/18 1319      PT LONG TERM GOAL #1   Title  Independent with a HEP.    Time  6    Period  Weeks    Status  Achieved      PT LONG TERM GOAL #2   Title  Stand 20 minutes with pain not > 3/10.    Time  6    Period  Weeks    Status  Achieved      PT LONG TERM GOAL #3   Title  Perform ADL's with pain not > 3/10.    Time  6    Period  Weeks    Status  Partially Met 5-6/10 pain5/30/19 with yard work            Plan - 02/02/18 1425    Clinical Impression Statement   Patient was able to tolerate treatment well today with an overall decrease of pain to 0/10 after STW/M and ultrasound. Patient reported she has been compliant with HEP. Normal response to modalities upon removal.    Clinical Decision Making  Moderate    Rehab Potential  Good    PT Frequency  2x / week    PT Duration  6 weeks    PT Treatment/Interventions  ADLs/Self Care Home Management;Cryotherapy;Electrical Stimulation;Ultrasound;Moist Heat;Therapeutic activities;Therapeutic exercise;Patient/family education;Manual techniques    PT Next Visit Plan  No spinal loading; HMP, Modalities and STW/M to low back; low-level core exercises (ie:  Hip bridges).    Consulted and Agree with Plan of Care  Patient       Patient will benefit from skilled therapeutic intervention in order to improve the following deficits and impairments:  Abnormal gait, Decreased activity tolerance, Postural dysfunction, Pain  Visit Diagnosis: Chronic bilateral low back pain without sciatica  Abnormal posture     Problem List Patient Active Problem List   Diagnosis Date Noted  . Primary osteoarthritis of left knee 11/02/2017  . Iron deficiency anemia 04/28/2017  . Vitamin D deficiency 04/28/2017  . Osteopenia 04/28/2017  . Gastritis, erosive   . Abdominal pain, epigastric 12/12/2016  . Colon cancer screening 11/03/2016  . Essential hypertension 11/03/2016  . Primary osteoarthritis of both knees 11/03/2016  . GERD (gastroesophageal reflux disease) 11/03/2016  . Age-related osteoporosis without current pathological fracture 11/03/2016  . Perforation of sigmoid colon ([facility] 06/29/2016    KGabriela Eves PT, DPT 02/02/2018, 2:46 PM  [facility] [address] [address]Phone: [phone]  Fax:  [phone] Name: Tiffany BuhmanMRN: [id number] of Birth: 6Dec 03, 1947

## 2018-02-03 ENCOUNTER — Telehealth: Payer: Self-pay | Admitting: Physician Assistant

## 2018-02-04 ENCOUNTER — Ambulatory Visit: Payer: Medicare HMO | Admitting: Physical Therapy

## 2018-02-04 ENCOUNTER — Encounter: Payer: Self-pay | Admitting: Physical Therapy

## 2018-02-04 DIAGNOSIS — M545 Low back pain: Principal | ICD-10-CM

## 2018-02-04 DIAGNOSIS — G8929 Other chronic pain: Secondary | ICD-10-CM

## 2018-02-04 DIAGNOSIS — R293 Abnormal posture: Secondary | ICD-10-CM | POA: Diagnosis not present

## 2018-02-04 NOTE — Therapy (Addendum)
[facility] [place], [place], [address] Phone: [phone]   Fax:  [phone]  Physical Therapy Treatment  Patient Details  Name: Tiffany Bradley MRN: [id number] Date of Birth: 10-Sep-1945 Referring Provider: Arther Abbott MD   Encounter Date: 02/04/2018  PT End of Session - 02/04/18 1340    Visit Number  9    Number of Visits  12    Date for PT Re-Evaluation  03/02/18    Authorization Type  10TH VISIT PROGRESS NOTE AND KX MODIFIER AFTER THE 15 VISIT.    MD 01-27-18    PT Start Time  1305    PT Stop Time  1347    PT Time Calculation (min)  42 min    Activity Tolerance  Patient tolerated treatment well    Behavior During Therapy  [facility] for tasks assessed/performed       Past Medical History:  Diagnosis Date  . Arthritis   . Back pain   . Cataract   . DJD (degenerative joint disease)   . Full dentures   . GERD (gastroesophageal reflux disease)   . Glaucoma   . History of bleeding ulcers    patient reports multiple hospitalizations remotely  . Hypertension   . Wears glasses     Past Surgical History:  Procedure Laterality Date  . BREAST REDUCTION SURGERY Bilateral 04/25/2013   Procedure: MAMMARY REDUCTION  (BREAST);  Surgeon: Cristine Polio, MD;  Location: [facility];  Service: Plastics;  Laterality: Bilateral;  . COLONOSCOPY     2008, normal per patient  . COLONOSCOPY N/A 01/16/2017   Procedure: COLONOSCOPY;  Surgeon: Danie Binder, MD;  [facility];  Service: Endoscopy;  Laterality: N/A;  830   . COSMETIC SURGERY    . ESOPHAGOGASTRODUODENOSCOPY N/A 01/16/2017   Procedure: ESOPHAGOGASTRODUODENOSCOPY (EGD);  Surgeon: Danie Binder, MD;  [facility];  Service: Endoscopy;  Laterality: N/A;  . PARTIAL COLECTOMY N/A 06/29/2016   Procedure: PARTIAL COLECTOMY;  Surgeon: Vickie Epley, MD;  [facility];  Service: General;  Laterality: N/A;  . TUBAL LIGATION      There  were no vitals filed for this visit.  Subjective Assessment - 02/04/18 1304    Subjective  Reports 80% improvement regarding LBP. Patient reports no longer having to sit after watering her flowers now.    Pertinent History  OP; DJD;     Limitations  Standing    How long can you stand comfortably?  10 minutes.    How long can you walk comfortably?  Short community distances.    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    Currently in Pain?  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      Restrictions   Weight Bearing Restrictions  No                   [facility] Adult PT Treatment/Exercise - 02/04/18 0001      Modalities   Modalities  Electrical Stimulation;Moist Heat;Ultrasound      Moist Heat Therapy  Number Minutes Moist Heat  15 Minutes    Moist Heat Location  Lumbar Spine      Electrical Stimulation   Electrical Stimulation Location  B low back    Electrical Stimulation Action  IFC    Electrical Stimulation Parameters  80-150 hz x15 min    Electrical Stimulation Goals  Pain      Ultrasound   Ultrasound Location  B lumbar paraspinals    Ultrasound Parameters  1.5 w/cm2, 100%, 1 mhz x10 min    Ultrasound Goals  Pain      Manual Therapy   Manual Therapy  Soft tissue mobilization    Soft tissue mobilization  STW to B lumbar paraspinals/ QL region to reduce pain in R SL               PT Short Term Goals - 01/05/18 1358      PT SHORT TERM GOAL #1   Title  STG's= LTG's.        PT Long Term Goals - 02/04/18 1332      PT LONG TERM GOAL #1   Title  Independent with a HEP.    Time  6    Period  Weeks    Status  Achieved      PT LONG TERM GOAL #2   Title  Stand 20 minutes with pain not > 3/10.     Time  6    Period  Weeks    Status  Achieved      PT LONG TERM GOAL #3   Title  Perform ADL's with pain not > 3/10.    Time  6    Period  Weeks    Status  Achieved            Plan - 02/04/18 1341    Clinical Impression Statement  Patient tolerated today's treatment well with reports of 80% overall improvement in regards to LBP. Patient able to achieve all goals set at evaluation today. Only minimal increased muscle tightness palpated in B lumbar musculature with good release following manual therapy. Normal modalities response noted following removal of the modalities.    Rehab Potential  Good    PT Frequency  2x / week    PT Duration  6 weeks    PT Treatment/Interventions  ADLs/Self Care Home Management;Cryotherapy;Electrical Stimulation;Ultrasound;Moist Heat;Therapeutic activities;Therapeutic exercise;Patient/family education;Manual techniques    PT Next Visit Plan  D/C summary required.    Consulted and Agree with Plan of Care  Patient       Patient will benefit from skilled therapeutic intervention in order to improve the following deficits and impairments:  Abnormal gait, Decreased activity tolerance, Postural dysfunction, Pain  Visit Diagnosis: Chronic bilateral low back pain without sciatica  Abnormal posture     Problem List Patient Active Problem List   Diagnosis Date Noted  . Primary osteoarthritis of left knee 11/02/2017  . Iron deficiency anemia 04/28/2017  . Vitamin D deficiency 04/28/2017  . Osteopenia 04/28/2017  . Gastritis, erosive   . Abdominal pain, epigastric 12/12/2016  . Colon cancer screening 11/03/2016  . Essential hypertension 11/03/2016  . Primary osteoarthritis of both knees 11/03/2016  . GERD (gastroesophageal reflux disease) 11/03/2016  . Age-related osteoporosis without current pathological fracture 11/03/2016  . Perforation of sigmoid colon ([name]) 06/29/2016    Standley Brooking, PTA 02/04/18 1:56 PM    [facility] [place], [place], [address] Phone: [phone]   Fax:  [phone]  Name: Tiffany Bradley MRN: [id number] Date of Birth: 01/18/1946  PHYSICAL THERAPY DISCHARGE SUMMARY  Visits from Start of Care: 9.  Current functional level related to goals / functional outcomes: See above.   Remaining deficits: All goals met.   Education / Equipment: HEP. Plan: Patient agrees to discharge.  Patient goals were met. Patient is being discharged due to meeting the stated rehab goals.  ?????         Mali Applegate MPT

## 2018-02-04 NOTE — Patient Instructions (Signed)
Tiffany Bradley  02/04/2018     @PREFPERIOPPHARMACY @   Your procedure is scheduled on  02/11/2018  Report to [facility] at  615   A.M.  Call this number if you have problems the morning of surgery:  [phone]   Remember:  No food after midnight.  You may drink clear liquids until 12 midnight on 02/10/2018 .  Clear liquids allowed are:                    Water, Juice (non-citric and without pulp), Carbonated beverages, Clear Tea, Black Coffee only, Plain Jell-O only, Gatorade and Plain Popsicles only    Take these medicines the morning of surgery with A SIP OF WATER  Amlodipine, voltaren, prilosec.    Do not wear jewelry, make-up or nail polish.  Do not wear lotions, powders, or perfumes, or deodorant.  Do not shave 48 hours prior to surgery.  Men may shave face and neck.  Do not bring valuables to the hospital.  [facility] is not responsible for any belongings or valuables.  Contacts, dentures or bridgework may not be worn into surgery.  Leave your suitcase in the car.  After surgery it may be brought to your room.  For patients admitted to the hospital, discharge time will be determined by your treatment team.  Patients discharged the day of surgery will not be allowed to drive home.   Name and phone number of your driver:   family Special instructions:  None  Please read over the following fact sheets that you were given. Anesthesia Post-op Instructions and Care and Recovery After Surgery       Total Knee Replacement Total knee replacement is a procedure to replace the knee joint with an artificial (prosthetic) knee joint. The purpose of this surgery is to reduce knee pain and improve knee function. The prosthetic knee joint (prosthesis) may be made of metal, plastic or ceramic. It replaces parts of the thigh bone (femur), lower leg bone (tibia), and kneecap (patella) that are removed during the procedure. Tell a health care provider about:  Any  allergies you have.  All medicines you are taking, including vitamins, herbs, eye drops, creams, and over-the-counter medicines.  Any problems you or family members have had with anesthetic medicines.  Any blood disorders you have.  Any surgeries you have had.  Any medical conditions you have.  Whether you are pregnant or may be pregnant. What are the risks? Generally, this is a safe procedure. However, problems may occur, including:  Infection.  Bleeding.  Allergic reactions to medicines.  Damage to other structures or organs.  Decreased range of motion of the knee.  Instability of the knee.  Loosening of the prosthetic joint.  Knee pain that does not go away (chronic pain).  What happens before the procedure?  Ask your health care provider about: ? Changing or stopping your regular medicines. This is especially important if you are taking diabetes medicines or blood thinners. ? Taking medicines such as aspirin and ibuprofen. These medicines can thin your blood. Do not take these medicines before your procedure if your health care provider instructs you not to.  Have dental care and routine cleanings completed before your procedure. Plan to not have dental work done for 3 months after your procedure. Germs from anywhere in your body, including your mouth, can travel to your new joint and infect it.  Follow instructions from your health care provider about eating or drinking restrictions.  Ask your health care provider how your surgical site will be marked or identified.  You may be given antibiotic medicine to help prevent infection.  If your health care provider prescribes physical therapy, do exercises as instructed.  Do not use any tobacco products, such as cigarettes, chewing tobacco, or e-cigarettes. If you need help quitting, ask your health care provider.  You may have a physical exam.  You may have tests, such as: ? X-rays. ? MRI. ? CT scan. ? Bone  scans.  You may have a blood or urine sample taken.  Plan to have someone take you home after the procedure.  If you will be going home right after the procedure, plan to have someone with you for at least 24 hours. It is recommended that you have someone to help care for you for at least 4-6 weeks after your procedure. What happens during the procedure?  To reduce your risk of infection: ? Your health care team will wash or sanitize their hands. ? Your skin will be washed with soap.  An IV tube will be inserted into one of your veins.  You will be given one or more of the following: ? A medicine to help you relax (sedative). ? A medicine to numb the area (local anesthetic). ? A medicine to make you fall asleep (general anesthetic). ? A medicine that is injected into your spine to numb the area below and slightly above the injection site (spinal anesthetic). ? A medicine that is injected into an area of your body to numb everything below the injection site (regional anesthetic).  An incision will be made in your knee.  Damaged cartilage and bone will be removed from your femur, tibia, and patella.  Parts of the prosthesis (liners) will be placed over the areas of bone and cartilage that were removed. A metal liner will be placed over your femur, and plastic liners will be placed over your tibia and the underside of your patella.  One or more small tubes (drains) may be placed near your incision to help drain extra fluid from your surgical site.  Your incision will be closed with stitches (sutures), skin glue, or adhesive strips. Medicine may be applied to your incision.  A bandage (dressing) will be placed over your incision. The procedure may vary among health care providers and hospitals. What happens after the procedure?  Your blood pressure, heart rate, breathing rate, and blood oxygen level will be monitored often until the medicines you were given have worn off.  You may  continue to receive fluids and medicines through an IV tube.  You will have some pain. Pain medicines will be available to help you.  You may have fluid coming from one or more drains in your incision.  You may have to wear compression stockings. These stockings help to prevent blood clots and reduce swelling in your legs.  You will be encouraged to move around as much as possible.  You may be given a continuous passive motion machine to use at home. You will be shown how to use this machine.  Do not drive for 24 hours if you received a sedative. This information is not intended to replace advice given to you by your health care provider. Make sure you discuss any questions you have with your health care provider. Document Released: 11/24/2000 Document Revised: 09/20/2016 Document Reviewed: 07/25/2015 Elsevier Interactive Patient Education  2018  [employer]  Total Knee Replacement, Care After Refer to this sheet in the next few weeks. These instructions provide you with information about caring for yourself after your procedure. Your health care provider may also give you more specific instructions. Your treatment has been planned according to current medical practices, but problems sometimes occur. Call your health care provider if you have any problems or questions after your procedure. What can I expect after the procedure? After the procedure, it is common to have:  Pain and swelling.  A small amount of blood or clear fluid coming from your incision.  Limited range of motion.  Follow these instructions at home: Medicines  Take over-the-counter and prescription medicines only as told by your health care provider.  If you were prescribed an antibiotic medicine, take it as told by your health care provider. Do not stop taking the antibiotic even if you start to feel better.  If you were prescribed a blood thinner (anticoagulant), take it as told by your health care  provider. If you have a splint or brace:  Wear the immobilizer as told by your health care provider. Remove it only as told by your health care provider.  Loosen the immobilizer if your toes tingle, become numb, or turn cold and blue.  Do not let your immobilizer get wet if it is not waterproof.  Keep the immobilizer clean. Bathing   Do not take baths, swim, or use a hot tub until your health care provider approves. Ask your health care provider if you can take showers. You may only be allowed to take sponge baths for bathing.  If you have an immobilizer that is not waterproof, cover it with a watertight covering when you take a bath or shower.  Keep your bandage (dressing) dry until your health care provider says it can be removed. Incision care and drain care  Check your incision area and drain site every day for signs of infection. Check for: ? More redness, swelling, or pain. ? More fluid or blood. ? Warmth. ? Pus or a bad smell.  Follow instructions from your health care provider about how to take care of your incision. Make sure you: ? Wash your hands with soap and water before you change your dressing. If soap and water are not available, use hand sanitizer. ? Change your dressing as told by your health care provider. ? Leave stitches (sutures), skin glue, or adhesive strips in place. These skin closures may need to stay in place for 2 weeks or longer. If adhesive strip edges start to loosen and curl up, you may trim the loose edges. Do not remove adhesive strips completely unless your health care provider tells you to do that.  If you have a drain, follow instructions from your health care provider about caring for it. Do not remove the drain tube or any dressings around the tube opening unless your health care provider approves. Managing pain, stiffness, and swelling  If directed, put ice on your knee. ? Put ice in a plastic bag. ? Place a towel between your skin and the  bag. ? Leave the ice on for 20 minutes, 2-3 times per day.  If directed, apply heat to the affected area as often as told by your health care provider. Use the heat source that your health care provider recommends, such as a moist heat pack or a heating pad. ? Place a towel between your skin and the heat source. ? Leave the heat on  for 20-30 minutes. ? Remove the heat if your skin turns bright red. This is especially important if you are unable to feel pain, heat, or cold. You may have a greater risk of getting burned.  Move your toes often to avoid stiffness and to lessen swelling.  Raise (elevate) your knee above the level of your heart while you are sitting or lying down.  Wear elastic knee support for as long as told by your health care provider. Driving   Do not drive until your health care provider approves. Ask your health care provider when it is safe to drive if you have an immobilizer on your knee.  Do not drive or operate heavy machinery while taking prescription pain medicine.  Do not drive for 24 hours if you received a sedative. Activity  Do not lift anything that is heavier than 10 lb (4.5 kg) until your health care provider approves.  Do not play contact sports until your health care provider approves.  Avoid high-impact activities, including running, jumping rope, and jumping jacks.  Avoid sitting for a long time without moving. Get up and move around at least every few hours.  If physical therapy was prescribed, do exercises as told by your health care provider.  Return to your normal activities as told by your health care provider. Ask your health care provider what activities are safe for you. Safety  Do not use your leg to support your body weight until your health care provider approves. Use crutches or a walker as told by your health care provider. General instructions   Do not have any dental work done for at least 3 months after your surgery. When you  do have dental work done, tell your dentist about your joint replacement.  Do not use any tobacco products, such as cigarettes, chewing tobacco, or e-cigarettes. If you need help quitting, ask your health care provider.  Wear compression stockings as told by your health care provider. These stockings help to prevent blood clots and reduce swelling in your legs.  If you have been sent home with a continuous passive motion machine, use it as told by your health care provider.  Drink enough fluid to keep your urine clear or pale yellow.  If you have been instructed to lose weight, follow instructions from your health care provider about how to do this safely.  Keep all follow-up visits as told by your health care provider. This is important. Contact a health care provider if:  You have more redness, swelling, or pain around your incision or drain.  You have more fluid or blood coming from your incision or drain.  Your incision or drain site feels warm to the touch.  You have pus or a bad smell coming from your incision or drain.  You have a fever.  Your incision breaks open after your health care provider removes your sutures, skin glue, or adhesive tape.  Your prosthesis feels loose.  You have knee pain that does not go away. Get help right away if:  You have a rash.  You have pain or swelling in your calf or thigh.  You have shortness of breath or difficulty breathing.  You have chest pain.  Your range of motion in your knee is getting worse. This information is not intended to replace advice given to you by your health care provider. Make sure you discuss any questions you have with your health care provider. Document Released: 03/07/2005 Document Revised: 04/21/2016 Document Reviewed: 07/25/2015  Elsevier Interactive Patient Education  [employer].  Spinal Anesthesia and Epidural Anesthesia Spinal anesthesia and epidural anesthesia are methods of numbing the body.  They are done by injecting numbing medicine (anesthetic) into the back, near the spinal cord. Spinal anesthesia is usually done to numb an area at and below the place where the injection is made. It is often used during surgeries of the pelvis, hips, legs, and lower abdomen. It begins to work almost immediately. Epidural anesthesia may be done to numb an area above or below the area where the injection is made. It is often used during childbirth and after major abdominal or chest surgery. It begins to work after 10-20 minutes. Tell a health care provider about:  Any allergies you have.  All medicines you are taking, including vitamins, herbs, eye drops, creams, and over-the-counter medicines.  Any problems you or family members have had with anesthetic medicines.  Any blood disorders you have.  Any surgeries you have had.  Any medical conditions you have.  Whether you are pregnant or may be pregnant.  Any recent alcohol, tobacco, or drug use. What are the risks? Generally, this is a safe procedure. However, problems may occur, including:  Headache.  A drop in blood pressure. In some cases, this can lead to a heart attack or a stroke.  Nerve damage.  Infection.  Allergic reaction to medicines.  Seizures.  Spinal fluid leak.  Bleeding around the injection site.  Inability to breathe. If this happens, a tube may be put into your windpipe (trachea) and a machine may be used to help you breathe until the anesthetic wears off.  Long-lasting numbness, pain, or loss of function of body parts.  Nausea and vomiting.  Dizziness and fainting.  Shivering.  Itching.  What happens before the procedure? Staying hydrated Follow instructions from your health care provider about hydration, which may include:  Up to 2 hours before the procedure - you may continue to drink clear liquids, such as water, clear fruit juice, black coffee, and plain tea.  Eating and drinking  restrictions Follow instructions from your health care provider about eating and drinking, which may include:  8 hours before the procedure - stop eating heavy meals or foods such as meat, fried foods, or fatty foods.  6 hours before the procedure - stop eating light meals or foods, such as toast or cereal.  6 hours before the procedure - stop drinking milk or drinks that contain milk.  2 hours before the procedure - stop drinking clear liquids.  Medicine Ask your health care provider about:  Changing or stopping your regular medicines. This is especially important if you are taking diabetes medicines or blood thinners.  Changing or stopping your dietary supplements.  Taking medicines such as aspirin and ibuprofen. These medicines can thin your blood. Do not take these medicines before your procedure if your health care provider instructs you not to.  General instructions   Do not use any tobacco products, such as cigarettes, chewing tobacco, and electronic cigarettes, for as long as possible before your procedure. If you need help quitting, ask your health care provider.  Ask your health care provider if you will have to stay overnight at the hospital or clinic.  If you will not have to stay overnight: ? Plan to have someone take you home from the hospital or clinic. ? Plan to have someone with you for 24 hours. What happens during the procedure?  A health care provider will  put patches on your chest, a cuff around your arm, or a sensor device on your finger. These will be attached to monitors that allow your health care provider to watch your blood pressure, pulse, and oxygen levels to make sure that the anesthetic does not cause any problems.  An IV tube may be inserted into one of your veins. The tube will be used to give you fluids and medicines throughout the procedure as needed.  You may be given a medicine to help you relax (sedative).  You will be asked to sit up or to  lie on your side with your knees and your chin bent toward your chest. These positions open up the space between the bones in your back, making it easier to inject the medicine.  The area of your back where the medicine will be injected will be cleaned.  A medicine called a local anesthetic may be injected to numb the area where the spinal or epidural anesthetic will be injected.  A needle will be inserted between the bones of your back. While this is being done: ? Continue to breathe normally. ? Stay as still and quiet as you can. ? If you feel a tingling shock or pain going down your leg, tell your health care provider but try not to move.  The spinal or epidural anesthetic will be injected.  If you receive an epidural anesthetic and need more than one dose, a tiny, flexible tube (catheter) will be placed where the anesthetic was injected. Additional doses will be given through the catheter. If you need pain medicine after the procedure, the catheter will be kept in place.  If an epidural catheter has not been placed in your injection site or left there, a small bandage (dressing) will be placed over the injection site. The procedure may vary among health care providers and hospitals. What happens after the procedure?  You will need to stay in bed until your health care provider says it safe for you to walk.  Your blood pressure, heart rate, breathing rate, and blood oxygen level will be monitored until the medicines you were given have worn off.  If you have a catheter, it will be removed when it is no longer needed.  Do not drive for 24 hours if you received a sedative.  It is common to have nausea and itching. There are medicines that can help with these side effects. It is also common to have: ? Sleepiness. ? Vomiting. ? Numbness or tingling in your legs. ? Trouble urinating. This information is not intended to replace advice given to you by your health care provider. Make sure  you discuss any questions you have with your health care provider. Document Released: 11/08/2003 Document Revised: 01/29/2016 Document Reviewed: 12/10/2015 Elsevier Interactive Patient Education  2018 [employer].  Spinal Anesthesia and Epidural Anesthesia, Care After These instructions give you information about caring for yourself after your procedure. Your doctor may also give you more specific instructions. Call your doctor if you have any problems or questions after your procedure. Follow these instructions at home: For at least 24 hours after the procedure:   Do not: ? Do activities where you could fall or get hurt (injured). ? Drive. ? Use heavy machinery. ? Drink alcohol. ? Take sleeping pills or medicines that make you sleepy (drowsy). ? Make important decisions. ? Sign legal documents. ? Take care of children on your own.  Rest. Eating and drinking  If you throw up (vomit),  drink water, juice, or soup when you can drink without throwing up.  Make sure you do not feel like throwing up (are not nauseous) before you eat.  Follow the diet that is recommended by your doctor. General instructions  Have a responsible adult stay with you until you are awake and alert.  Take over-the-counter and prescription medicines only as told by your doctor.  If you smoke, do not smoke unless an adult is watching.  Keep all follow-up visits as told by your doctor. This is important. Contact a doctor if:  It has been more than one day since your procedure and you feel like throwing up.  It has been more than one day since your procedure and you throw up.  You have a rash. Get help right away if:  You have a fever.  You have a headache that lasts a long time.  You have a very bad headache.  Your vision is blurry.  You see two of a single object (double vision).  You are dizzy or light-headed.  You faint.  Your arms or legs tingle, feel weak, or get numb.  You have  trouble breathing.  You cannot pee (urinate). This information is not intended to replace advice given to you by your health care provider. Make sure you discuss any questions you have with your health care provider. Document Released: 12/10/2015 Document Revised: 04/10/2016 Document Reviewed: 12/10/2015 Elsevier Interactive Patient Education  2018 [employer].  General Anesthesia, Adult General anesthesia is the use of medicines to make a person "go to sleep" (be unconscious) for a medical procedure. General anesthesia is often recommended when a procedure:  Is long.  Requires you to be still or in an unusual position.  Is major and can cause you to lose blood.  Is impossible to do without general anesthesia.  The medicines used for general anesthesia are called general anesthetics. In addition to making you sleep, the medicines:  Prevent pain.  Control your blood pressure.  Relax your muscles.  Tell a health care provider about:  Any allergies you have.  All medicines you are taking, including vitamins, herbs, eye drops, creams, and over-the-counter medicines.  Any problems you or family members have had with anesthetic medicines.  Types of anesthetics you have had in the past.  Any bleeding disorders you have.  Any surgeries you have had.  Any medical conditions you have.  Any history of heart or lung conditions, such as heart failure, sleep apnea, or chronic obstructive pulmonary disease (COPD).  Whether you are pregnant or may be pregnant.  Whether you use tobacco, alcohol, marijuana, or street drugs.  Any history of Financial [employer].  Any history of depression or anxiety. What are the risks? Generally, this is a safe procedure. However, problems may occur, including:  Allergic reaction to anesthetics.  Lung and heart problems.  Inhaling food or liquids from your stomach into your lungs (aspiration).  Injury to nerves.  Waking up during your  procedure and being unable to move (rare).  Extreme agitation or a state of mental confusion (delirium) when you wake up from the anesthetic.  Air in the bloodstream, which can lead to stroke.  These problems are more likely to develop if you are having a major surgery or if you have an advanced medical condition. You can prevent some of these complications by answering all of your health care provider's questions thoroughly and by following all pre-procedure instructions. General anesthesia can cause side effects, including:  Nausea or vomiting  A sore throat from the breathing tube.  Feeling cold or shivery.  Feeling tired, washed out, or achy.  Sleepiness or drowsiness.  Confusion or agitation.  What happens before the procedure? Staying hydrated Follow instructions from your health care provider about hydration, which may include:  Up to 2 hours before the procedure - you may continue to drink clear liquids, such as water, clear fruit juice, black coffee, and plain tea.  Eating and drinking restrictions Follow instructions from your health care provider about eating and drinking, which may include:  8 hours before the procedure - stop eating heavy meals or foods such as meat, fried foods, or fatty foods.  6 hours before the procedure - stop eating light meals or foods, such as toast or cereal.  6 hours before the procedure - stop drinking milk or drinks that contain milk.  2 hours before the procedure - stop drinking clear liquids.  Medicines  Ask your health care provider about: ? Changing or stopping your regular medicines. This is especially important if you are taking diabetes medicines or blood thinners. ? Taking medicines such as aspirin and ibuprofen. These medicines can thin your blood. Do not take these medicines before your procedure if your health care provider instructs you not to. ? Taking new dietary supplements or medicines. Do not take these during the  week before your procedure unless your health care provider approves them.  If you are told to take a medicine or to continue taking a medicine on the day of the procedure, take the medicine with sips of water. General instructions   Ask if you will be going home the same day, the following day, or after a longer hospital stay. ? Plan to have someone take you home. ? Plan to have someone stay with you for the first 24 hours after you leave the hospital or clinic.  For 3-6 weeks before the procedure, try not to use any tobacco products, such as cigarettes, chewing tobacco, and e-cigarettes.  You may brush your teeth on the morning of the procedure, but make sure to spit out the toothpaste. What happens during the procedure?  You will be given anesthetics through a mask and through an IV tube in one of your veins.  You may receive medicine to help you relax (sedative).  As soon as you are asleep, a breathing tube may be used to help you breathe.  An anesthesia specialist will stay with you throughout the procedure. He or she will help keep you comfortable and safe by continuing to give you medicines and adjusting the amount of medicine that you get. He or she will also watch your blood pressure, pulse, and oxygen levels to make sure that the anesthetics do not cause any problems.  If a breathing tube was used to help you breathe, it will be removed before you wake up. The procedure may vary among health care providers and hospitals. What happens after the procedure?  You will wake up, often slowly, after the procedure is complete, usually in a recovery area.  Your blood pressure, heart rate, breathing rate, and blood oxygen level will be monitored until the medicines you were given have worn off.  You may be given medicine to help you calm down if you feel anxious or agitated.  If you will be going home the same day, your health care provider may check to make sure you can stand,  drink, and urinate.  Your health care  providers will treat your pain and side effects before you go home.  Do not drive for 24 hours if you received a sedative.  You may: ? Feel nauseous and vomit. ? Have a sore throat. ? Have mental slowness. ? Feel cold or shivery. ? Feel sleepy. ? Feel tired. ? Feel sore or achy, even in parts of your body where you did not have surgery. This information is not intended to replace advice given to you by your health care provider. Make sure you discuss any questions you have with your health care provider. Document Released: 11/25/2007 Document Revised: 01/29/2016 Document Reviewed: 08/02/2015 Elsevier Interactive Patient Education  2018 [employer]. General Anesthesia, Adult, Care After These instructions provide you with information about caring for yourself after your procedure. Your health care provider may also give you more specific instructions. Your treatment has been planned according to current medical practices, but problems sometimes occur. Call your health care provider if you have any problems or questions after your procedure. What can I expect after the procedure? After the procedure, it is common to have:  Vomiting.  A sore throat.  Mental slowness.  It is common to feel:  Nauseous.  Cold or shivery.  Sleepy.  Tired.  Sore or achy, even in parts of your body where you did not have surgery.  Follow these instructions at home: For at least 24 hours after the procedure:  Do not: ? Participate in activities where you could fall or become injured. ? Drive. ? Use heavy machinery. ? Drink alcohol. ? Take sleeping pills or medicines that cause drowsiness. ? Make important decisions or sign legal documents. ? Take care of children on your own.  Rest. Eating and drinking  If you vomit, drink water, juice, or soup when you can drink without vomiting.  Drink enough fluid to keep your urine clear or pale  yellow.  Make sure you have little or no nausea before eating solid foods.  Follow the diet recommended by your health care provider. General instructions  Have a responsible adult stay with you until you are awake and alert.  Return to your normal activities as told by your health care provider. Ask your health care provider what activities are safe for you.  Take over-the-counter and prescription medicines only as told by your health care provider.  If you smoke, do not smoke without supervision.  Keep all follow-up visits as told by your health care provider. This is important. Contact a health care provider if:  You continue to have nausea or vomiting at home, and medicines are not helpful.  You cannot drink fluids or start eating again.  You cannot urinate after 8-12 hours.  You develop a skin rash.  You have fever.  You have increasing redness at the site of your procedure. Get help right away if:  You have difficulty breathing.  You have chest pain.  You have unexpected bleeding.  You feel that you are having a life-threatening or urgent problem. This information is not intended to replace advice given to you by your health care provider. Make sure you discuss any questions you have with your health care provider. Document Released: 11/24/2000 Document Revised: 01/21/2016 Document Reviewed: 08/02/2015 Elsevier Interactive Patient Education  [employer].

## 2018-02-08 ENCOUNTER — Other Ambulatory Visit: Payer: Self-pay

## 2018-02-08 ENCOUNTER — Encounter (HOSPITAL_COMMUNITY): Payer: Self-pay

## 2018-02-08 ENCOUNTER — Encounter (HOSPITAL_COMMUNITY)
Admission: RE | Admit: 2018-02-08 | Discharge: 2018-02-08 | Disposition: A | Discharge status: Home / Self Care | Payer: Medicare HMO | Source: Ambulatory Visit | Attending: Orthopedic Surgery | Admitting: Orthopedic Surgery

## 2018-02-08 DIAGNOSIS — Z01812 Encounter for preprocedural laboratory examination: Secondary | ICD-10-CM | POA: Insufficient documentation

## 2018-02-08 LAB — BASIC METABOLIC PANEL
Anion gap: 9 (ref 5–15)
BUN: 15 mg/dL (ref 6–20)
CHLORIDE: 107 mmol/L (ref 101–111)
CO2: 27 mmol/L (ref 22–32)
Calcium: 9.3 mg/dL (ref 8.9–10.3)
Creatinine, Ser: 0.61 mg/dL (ref 0.44–1.00)
GFR calc Af Amer: >60 mL/min (ref >60)
GFR calc non Af Amer: >60 mL/min (ref >60)
GLUCOSE: 107 mg/dL — AB (ref 65–99)
Potassium: 3.7 mmol/L (ref 3.5–5.1)
SODIUM: 143 mmol/L (ref 135–145)

## 2018-02-08 LAB — CBC WITH DIFFERENTIAL/PLATELET
Basophils Absolute: 0.1 10*3/uL (ref 0.0–0.1)
Basophils Relative: 1 %
EOS PCT: 4 %
Eosinophils Absolute: 0.2 10*3/uL (ref 0.0–0.7)
HCT: 33.5 % — ABNORMAL LOW (ref 36.0–46.0)
Hemoglobin: 10.6 g/dL — ABNORMAL LOW (ref 12.0–15.0)
LYMPHS ABS: 1.1 10*3/uL (ref 0.7–4.0)
Lymphocytes Relative: 26 %
MCH: 25.5 pg — AB (ref 26.0–34.0)
MCHC: 31.6 g/dL (ref 30.0–36.0)
MCV: 80.5 fL (ref 78.0–100.0)
MONO ABS: 0.3 10*3/uL (ref 0.1–1.0)
MONOS PCT: 7 %
Neutro Abs: 2.6 10*3/uL (ref 1.7–7.7)
Neutrophils Relative %: 62 %
PLATELETS: 318 10*3/uL (ref 150–400)
RBC: 4.16 MIL/uL (ref 3.87–5.11)
RDW: 16 % — ABNORMAL HIGH (ref 11.5–15.5)
WBC: 4.1 10*3/uL (ref 4.0–10.5)

## 2018-02-08 LAB — SURGICAL PCR SCREEN
MRSA, PCR: NEGATIVE
Staphylococcus aureus: NEGATIVE

## 2018-02-08 LAB — PREPARE RBC (CROSSMATCH)

## 2018-02-10 ENCOUNTER — Telehealth: Payer: Self-pay | Admitting: Physician Assistant

## 2018-02-10 MED ORDER — TRANEXAMIC ACID 1000 MG/10ML IV SOLN
1000.0000 mg | INTRAVENOUS | Status: AC
  Administered 2018-02-11: 1000 mg via INTRAVENOUS
  Filled 2018-02-10: qty 10

## 2018-02-10 NOTE — H&P (Signed)
Chief Complaint  Patient presents with  .      Left knee pain     Surgical procedure planned: Left total knee arthroplasty   MEDICAL DECISION SECTION  xrays ordered? no   My independent reading of xrays: Previous x-rays show severe valgus deformity of the left knee severe arthritis in the lateral compartment   She has improved in terms of her back pain         Encounter Diagnoses  Name Primary?  . Primary osteoarthritis of both knees Yes  . Spondylolisthesis at L4-L5 level        Osteoarthritis both knees left knee worse    PLAN:    The risks and benefits of surgery have been explained to the patient she agrees to accept those risks specific to her situation include the following she was advised in the potential postoperative complication of foot drop which could last for 6 months and required AFO bracing   She lives at home with her daughter and 2 grandchildren.  She has [employer] Medicare and will probably have to go home after surgery.   She says that she can no longer take the pain in her knee as it is keeping her up at night and preventing her from activity and exercise      The procedure has been fully reviewed with the patient; The risks and benefits of surgery have been discussed and explained and understood. Alternative treatment has also been reviewed, questions were encouraged and answered. The postoperative plan is also been reviewed.   Nonsurgical treatment as described in the history and physical section was attempted and unsuccessful and the patient has agreed to proceed with surgical intervention to improve their situation.   No orders of the defined types were placed in this encounter.               Chief Complaint  Patient presents with  . Back Pain      feels better after going for physical therapy       [age] old female bilateral knee arthritis followed and even became unhappy because she was not getting surgery when they said she would presented test  with bilateral knee pain worsening deformity of the left lower extremity pain in her back in both legs left worse than right difficulty getting out of a chair trouble walking.  Pain was unrelieved by diclofenac 75 and glucosamine/chondroitin   She complained of activities of daily living becoming more difficult and pain standing for long periods of time         Review of Systems  Musculoskeletal: Positive for back pain and joint pain.  All other systems reviewed and are negative.           Past Medical History:  Diagnosis Date  . Arthritis    . Back pain    . Cataract    . DJD (degenerative joint disease)    . Full dentures    . GERD (gastroesophageal reflux disease)    . Glaucoma    . History of bleeding ulcers      patient reports multiple hospitalizations remotely  . Hypertension    . Wears glasses             Past Surgical History:  Procedure Laterality Date  . BREAST REDUCTION SURGERY Bilateral 04/25/2013    Procedure: MAMMARY REDUCTION  (BREAST);  Surgeon: Louisa SecondGerald Truesdale, MD;  Location: [facility];  Service: Plastics;  Laterality: Bilateral;  . COLONOSCOPY  2008, normal per patient  . COLONOSCOPY N/A 01/16/2017    Procedure: COLONOSCOPY;  Surgeon: West Bali, MD;  Location: [facility];  Service: Endoscopy;  Laterality: N/A;  830    . COSMETIC SURGERY      . ESOPHAGOGASTRODUODENOSCOPY N/A 01/16/2017    Procedure: ESOPHAGOGASTRODUODENOSCOPY (EGD);  Surgeon: West Bali, MD;  Location: [facility];  Service: Endoscopy;  Laterality: N/A;  . PARTIAL COLECTOMY N/A 06/29/2016    Procedure: PARTIAL COLECTOMY;  Surgeon: Ancil Linsey, MD;  Location: [facility];  Service: General;  Laterality: N/A;  . TUBAL LIGATION               Family History  Problem Relation Age of Onset  . Kidney disease Mother    . Stroke Mother    . Heart disease Father    . Colon cancer Neg Hx      Social History         Tobacco Use  . Smoking status:  Former Smoker      Types: Cigarettes      Last attempt to quit: 04/20/1993      Years since quitting: 24.7  . Smokeless tobacco: Never Used  Substance Use Topics  . Alcohol use: Yes      Comment: rare  . Drug use: No      No Known Allergies   Active Medications      Current Meds  Medication Sig  . amLODipine (NORVASC) 10 MG tablet Take 1 tablet (10 mg total) by mouth daily.  . diclofenac (VOLTAREN) 75 MG EC tablet Take 1 tablet (75 mg total) by mouth 2 (two) times daily.  . Glucosamine HCl (GLUCOSAMINE PO) Take 2 tablets by mouth daily.  . Multiple Vitamins-Minerals (MULTI-VITAMIN GUMMIES PO) Take 2 each by mouth daily.   Marland Kitchen omeprazole (PRILOSEC) 40 MG capsule Take 1 capsule (40 mg total) by mouth daily.  . sucralfate (CARAFATE) 1 g tablet Take 1 g by mouth 4 (four) times daily -  with meals and at bedtime.   Marland Kitchen tiZANidine (ZANAFLEX) 4 MG tablet Take 1 tablet (4 mg total) by mouth at bedtime as needed for muscle spasms.  . Vitamin D, Ergocalciferol, (DRISDOL) 50000 units CAPS capsule Take 50,000 Units by mouth every 7 (seven) days.        BP (!) 148/96   Pulse 79   Ht 5\' 3"  (1.6 m)   Wt 168 lb (76.2 kg)   BMI 29.76 kg/m    Physical Exam She is well-developed well-nourished oriented to person place and time alert and oriented x3 mood and affect are normal normal mood and affect are noted with normal judgment   Vitals are stable Ortho Exam   Left knee she has valgus alignment fairly severe flexion only 95 degrees 10 degree flexion contracture she has a pseudo-laxity of the varus valgus plane with firm endpoints no AP laxity muscle tone is normal skin is intact quadriceps function is normal there are no rashes ulcerations or lacerations or erythema to the skin pulses normal temperature normal no edema or swelling normal sensation and reflexes with negative straight leg raise on the left   On the right side she has a mild valgus deformity with 5 to 115 degrees of flexion  contracture with no laxity in the sagittal coronal plane muscle strength and tone are normal skin is intact without erythema pulse and perfusion are normal sensation and reflexes are normal   Fuller Canada, MD

## 2018-02-10 NOTE — Telephone Encounter (Signed)
Attempts have been made to contact patient and no call back - this encounter will be closed.

## 2018-02-11 ENCOUNTER — Encounter (HOSPITAL_COMMUNITY): Payer: Self-pay | Admitting: *Deleted

## 2018-02-11 ENCOUNTER — Encounter (HOSPITAL_COMMUNITY)
Admission: RE | Disposition: A | Discharge status: Home Health | Payer: Self-pay | Source: Home / Self Care | Attending: Orthopedic Surgery

## 2018-02-11 ENCOUNTER — Inpatient Hospital Stay (HOSPITAL_COMMUNITY)
Admission: RE | Admit: 2018-02-11 | Discharge: 2018-02-13 | DRG: 470 | Disposition: A | Discharge status: Home Health | Payer: Medicare HMO | Attending: Orthopedic Surgery | Admitting: Orthopedic Surgery

## 2018-02-11 ENCOUNTER — Inpatient Hospital Stay (HOSPITAL_COMMUNITY): Payer: Medicare HMO | Admitting: Anesthesiology

## 2018-02-11 ENCOUNTER — Other Ambulatory Visit: Payer: Self-pay

## 2018-02-11 ENCOUNTER — Inpatient Hospital Stay (HOSPITAL_COMMUNITY): Payer: Medicare HMO

## 2018-02-11 DIAGNOSIS — Z471 Aftercare following joint replacement surgery: Secondary | ICD-10-CM | POA: Diagnosis not present

## 2018-02-11 DIAGNOSIS — Z01810 Encounter for preprocedural cardiovascular examination: Secondary | ICD-10-CM

## 2018-02-11 DIAGNOSIS — Z87891 Personal history of nicotine dependence: Secondary | ICD-10-CM | POA: Diagnosis not present

## 2018-02-11 DIAGNOSIS — M170 Bilateral primary osteoarthritis of knee: Secondary | ICD-10-CM | POA: Diagnosis not present

## 2018-02-11 DIAGNOSIS — K219 Gastro-esophageal reflux disease without esophagitis: Secondary | ICD-10-CM | POA: Diagnosis not present

## 2018-02-11 DIAGNOSIS — Z8711 Personal history of peptic ulcer disease: Secondary | ICD-10-CM | POA: Diagnosis not present

## 2018-02-11 DIAGNOSIS — Z01812 Encounter for preprocedural laboratory examination: Secondary | ICD-10-CM | POA: Diagnosis not present

## 2018-02-11 DIAGNOSIS — Z01818 Encounter for other preprocedural examination: Secondary | ICD-10-CM

## 2018-02-11 DIAGNOSIS — M25562 Pain in left knee: Secondary | ICD-10-CM | POA: Diagnosis present

## 2018-02-11 DIAGNOSIS — I10 Essential (primary) hypertension: Secondary | ICD-10-CM | POA: Diagnosis not present

## 2018-02-11 DIAGNOSIS — Z96652 Presence of left artificial knee joint: Secondary | ICD-10-CM | POA: Diagnosis not present

## 2018-02-11 DIAGNOSIS — M1712 Unilateral primary osteoarthritis, left knee: Secondary | ICD-10-CM

## 2018-02-11 HISTORY — PX: TOTAL KNEE ARTHROPLASTY: SHX125

## 2018-02-11 SURGERY — ARTHROPLASTY, KNEE, TOTAL
Anesthesia: Spinal | Site: Knee | Laterality: Left

## 2018-02-11 MED ORDER — TRANEXAMIC ACID 1000 MG/10ML IV SOLN
1000.0000 mg | Freq: Once | INTRAVENOUS | Status: AC
  Administered 2018-02-11: 1000 mg via INTRAVENOUS
  Filled 2018-02-11: qty 10

## 2018-02-11 MED ORDER — HYDROCODONE-ACETAMINOPHEN 5-325 MG PO TABS
1.0000 | ORAL_TABLET | ORAL | Status: DC | PRN
  Administered 2018-02-11: 1 via ORAL
  Administered 2018-02-11: 2 via ORAL
  Filled 2018-02-11: qty 2
  Filled 2018-02-11: qty 1

## 2018-02-11 MED ORDER — HYDROCODONE-ACETAMINOPHEN 7.5-325 MG PO TABS: 1.0000 | ORAL_TABLET | ORAL | Status: DC | PRN

## 2018-02-11 MED ORDER — PHENYLEPHRINE HCL 10 MG/ML IJ SOLN
INTRAMUSCULAR | Status: DC | PRN
  Administered 2018-02-11 (×3): 50 ug via INTRAVENOUS

## 2018-02-11 MED ORDER — ASPIRIN EC 325 MG PO TBEC
325.0000 mg | DELAYED_RELEASE_TABLET | Freq: Every day | ORAL | Status: DC
Start: 2018-02-12 — End: 2018-02-13
  Administered 2018-02-12 – 2018-02-13 (×2): 325 mg via ORAL
  Filled 2018-02-11 (×2): qty 1

## 2018-02-11 MED ORDER — FENTANYL CITRATE (PF) 100 MCG/2ML IJ SOLN
INTRAMUSCULAR | Status: AC
  Filled 2018-02-11: qty 2

## 2018-02-11 MED ORDER — ALUM & MAG HYDROXIDE-SIMETH 200-200-20 MG/5ML PO SUSP
30.0000 mL | ORAL | Status: DC | PRN
Start: 2018-02-11 — End: 2018-02-13

## 2018-02-11 MED ORDER — MEPERIDINE HCL 50 MG/ML IJ SOLN: 6.2500 mg | INTRAMUSCULAR | Status: DC | PRN

## 2018-02-11 MED ORDER — PROMETHAZINE HCL 25 MG/ML IJ SOLN: 6.2500 mg | INTRAMUSCULAR | Status: DC | PRN

## 2018-02-11 MED ORDER — CHLORHEXIDINE GLUCONATE 4 % EX LIQD: 60.0000 mL | Freq: Once | CUTANEOUS | Status: DC

## 2018-02-11 MED ORDER — HYDROMORPHONE HCL 1 MG/ML IJ SOLN: 0.2500 mg | INTRAMUSCULAR | Status: DC | PRN

## 2018-02-11 MED ORDER — PHENYLEPHRINE 40 MCG/ML (10ML) SYRINGE FOR IV PUSH (FOR BLOOD PRESSURE SUPPORT)
PREFILLED_SYRINGE | INTRAVENOUS | Status: AC
Start: 2018-02-11 — End: ?
  Filled 2018-02-11: qty 10

## 2018-02-11 MED ORDER — CEFAZOLIN SODIUM-DEXTROSE 2-4 GM/100ML-% IV SOLN
INTRAVENOUS | Status: AC
  Filled 2018-02-11: qty 100

## 2018-02-11 MED ORDER — CEFAZOLIN SODIUM-DEXTROSE 2-4 GM/100ML-% IV SOLN
2.0000 g | Freq: Four times a day (QID) | INTRAVENOUS | Status: AC
  Administered 2018-02-11 (×2): 2 g via INTRAVENOUS
  Filled 2018-02-11 (×2): qty 100

## 2018-02-11 MED ORDER — METOCLOPRAMIDE HCL 10 MG PO TABS: 5.0000 mg | ORAL_TABLET | Freq: Three times a day (TID) | ORAL | Status: DC | PRN

## 2018-02-11 MED ORDER — SODIUM CHLORIDE 0.9 % IJ SOLN
INTRAMUSCULAR | Status: AC
  Filled 2018-02-11: qty 40

## 2018-02-11 MED ORDER — MENTHOL 3 MG MT LOZG: 1.0000 | LOZENGE | OROMUCOSAL | Status: DC | PRN

## 2018-02-11 MED ORDER — EPHEDRINE SULFATE 50 MG/ML IJ SOLN
INTRAMUSCULAR | Status: AC
  Filled 2018-02-11: qty 1

## 2018-02-11 MED ORDER — CALCIUM CARBONATE-VITAMIN D 500-200 MG-UNIT PO TABS
1.0000 | ORAL_TABLET | Freq: Every day | ORAL | Status: DC
Start: 2018-02-11 — End: 2018-02-13
  Administered 2018-02-11 – 2018-02-13 (×3): 1 via ORAL
  Filled 2018-02-11 (×3): qty 1

## 2018-02-11 MED ORDER — DEXAMETHASONE SODIUM PHOSPHATE 10 MG/ML IJ SOLN
10.0000 mg | Freq: Once | INTRAMUSCULAR | Status: AC
  Administered 2018-02-12: 10 mg via INTRAVENOUS
  Filled 2018-02-11: qty 1

## 2018-02-11 MED ORDER — ONDANSETRON HCL 4 MG PO TABS
4.0000 mg | ORAL_TABLET | Freq: Four times a day (QID) | ORAL | Status: DC | PRN
  Filled 2018-02-11: qty 1

## 2018-02-11 MED ORDER — PANTOPRAZOLE SODIUM 40 MG PO TBEC: 80.0000 mg | DELAYED_RELEASE_TABLET | Freq: Every day | ORAL | Status: DC

## 2018-02-11 MED ORDER — FLEET ENEMA 7-19 GM/118ML RE ENEM: 1.0000 | ENEMA | Freq: Once | RECTAL | Status: DC | PRN

## 2018-02-11 MED ORDER — KETOROLAC TROMETHAMINE 15 MG/ML IJ SOLN
7.5000 mg | Freq: Four times a day (QID) | INTRAMUSCULAR | Status: AC
  Administered 2018-02-11 – 2018-02-12 (×4): 7.5 mg via INTRAVENOUS
  Filled 2018-02-11 (×4): qty 1

## 2018-02-11 MED ORDER — ONDANSETRON HCL 4 MG/2ML IJ SOLN
4.0000 mg | Freq: Once | INTRAMUSCULAR | Status: AC
  Administered 2018-02-11: 4 mg via INTRAVENOUS
  Filled 2018-02-11: qty 2

## 2018-02-11 MED ORDER — METOCLOPRAMIDE HCL 5 MG/ML IJ SOLN
5.0000 mg | Freq: Three times a day (TID) | INTRAMUSCULAR | Status: DC | PRN
  Administered 2018-02-11: 10 mg via INTRAVENOUS
  Filled 2018-02-11: qty 2

## 2018-02-11 MED ORDER — MIDAZOLAM HCL 5 MG/5ML IJ SOLN
INTRAMUSCULAR | Status: DC | PRN
  Administered 2018-02-11 (×2): 1 mg via INTRAVENOUS

## 2018-02-11 MED ORDER — METHOCARBAMOL 1000 MG/10ML IJ SOLN
500.0000 mg | Freq: Once | INTRAVENOUS | Status: AC
  Administered 2018-02-11: 500 mg via INTRAVENOUS
  Filled 2018-02-11: qty 550

## 2018-02-11 MED ORDER — HYDROCODONE-ACETAMINOPHEN 5-325 MG PO TABS
1.0000 | ORAL_TABLET | ORAL | Status: DC | PRN
  Administered 2018-02-11: 1 via ORAL
  Filled 2018-02-11: qty 1

## 2018-02-11 MED ORDER — SENNOSIDES-DOCUSATE SODIUM 8.6-50 MG PO TABS: 1.0000 | ORAL_TABLET | Freq: Every evening | ORAL | Status: DC | PRN

## 2018-02-11 MED ORDER — ACETAMINOPHEN 325 MG PO TABS: 325.0000 mg | ORAL_TABLET | Freq: Four times a day (QID) | ORAL | Status: DC | PRN

## 2018-02-11 MED ORDER — SODIUM CHLORIDE 0.9 % IV SOLN
INTRAVENOUS | Status: AC
  Administered 2018-02-11 (×2): via INTRAVENOUS

## 2018-02-11 MED ORDER — HYDROCODONE-ACETAMINOPHEN 7.5-325 MG PO TABS: 1.0000 | ORAL_TABLET | Freq: Once | ORAL | Status: DC | PRN

## 2018-02-11 MED ORDER — METHOCARBAMOL 1000 MG/10ML IJ SOLN
500.0000 mg | Freq: Four times a day (QID) | INTRAVENOUS | Status: DC | PRN
  Filled 2018-02-11: qty 5

## 2018-02-11 MED ORDER — ADULT MULTIVITAMIN W/MINERALS CH
1.0000 | ORAL_TABLET | Freq: Every day | ORAL | Status: DC
  Administered 2018-02-12 – 2018-02-13 (×2): 1 via ORAL
  Filled 2018-02-11 (×2): qty 1

## 2018-02-11 MED ORDER — PREGABALIN 50 MG PO CAPS
50.0000 mg | ORAL_CAPSULE | Freq: Once | ORAL | Status: AC
  Administered 2018-02-11: 50 mg via ORAL
  Filled 2018-02-11: qty 1

## 2018-02-11 MED ORDER — TRAMADOL HCL 50 MG PO TABS
50.0000 mg | ORAL_TABLET | Freq: Four times a day (QID) | ORAL | Status: DC
  Administered 2018-02-11 – 2018-02-13 (×8): 50 mg via ORAL
  Filled 2018-02-11 (×8): qty 1

## 2018-02-11 MED ORDER — PHENOL 1.4 % MT LIQD: 1.0000 | OROMUCOSAL | Status: DC | PRN

## 2018-02-11 MED ORDER — PROPOFOL 10 MG/ML IV BOLUS
INTRAVENOUS | Status: AC
  Filled 2018-02-11: qty 40

## 2018-02-11 MED ORDER — LACTATED RINGERS IV SOLN
INTRAVENOUS | Status: DC
  Administered 2018-02-11: 07:00:00 via INTRAVENOUS

## 2018-02-11 MED ORDER — AMLODIPINE BESYLATE 5 MG PO TABS
10.0000 mg | ORAL_TABLET | Freq: Every day | ORAL | Status: DC
  Administered 2018-02-12 – 2018-02-13 (×2): 10 mg via ORAL
  Filled 2018-02-11 (×2): qty 2

## 2018-02-11 MED ORDER — PROPOFOL 500 MG/50ML IV EMUL
INTRAVENOUS | Status: DC | PRN
  Administered 2018-02-11: 25 ug/kg/min via INTRAVENOUS

## 2018-02-11 MED ORDER — DOCUSATE SODIUM 100 MG PO CAPS
100.0000 mg | ORAL_CAPSULE | Freq: Two times a day (BID) | ORAL | Status: DC
  Administered 2018-02-11 – 2018-02-13 (×4): 100 mg via ORAL
  Filled 2018-02-11 (×6): qty 1

## 2018-02-11 MED ORDER — METHOCARBAMOL 500 MG PO TABS: 500.0000 mg | ORAL_TABLET | Freq: Four times a day (QID) | ORAL | Status: DC | PRN

## 2018-02-11 MED ORDER — CEFAZOLIN SODIUM-DEXTROSE 2-4 GM/100ML-% IV SOLN
2.0000 g | INTRAVENOUS | Status: AC
  Administered 2018-02-11: 2 g via INTRAVENOUS

## 2018-02-11 MED ORDER — CELECOXIB 100 MG PO CAPS
200.0000 mg | ORAL_CAPSULE | Freq: Two times a day (BID) | ORAL | Status: DC
  Administered 2018-02-12 – 2018-02-13 (×2): 200 mg via ORAL
  Filled 2018-02-11 (×2): qty 2

## 2018-02-11 MED ORDER — SODIUM CHLORIDE 0.9 % IR SOLN
Status: DC | PRN
  Administered 2018-02-11: 3000 mL

## 2018-02-11 MED ORDER — BUPIVACAINE-EPINEPHRINE (PF) 0.5% -1:200000 IJ SOLN
INTRAMUSCULAR | Status: AC
  Filled 2018-02-11: qty 30

## 2018-02-11 MED ORDER — LACTATED RINGERS IV SOLN
INTRAVENOUS | Status: DC
  Administered 2018-02-11: 08:00:00 via INTRAVENOUS
  Administered 2018-02-11: 1000 mL via INTRAVENOUS

## 2018-02-11 MED ORDER — ACETAMINOPHEN 500 MG PO TABS
500.0000 mg | ORAL_TABLET | Freq: Four times a day (QID) | ORAL | Status: AC
  Administered 2018-02-11 – 2018-02-12 (×4): 500 mg via ORAL
  Filled 2018-02-11 (×4): qty 1

## 2018-02-11 MED ORDER — PANTOPRAZOLE SODIUM 40 MG PO TBEC
40.0000 mg | DELAYED_RELEASE_TABLET | Freq: Every day | ORAL | Status: DC
  Administered 2018-02-12 – 2018-02-13 (×2): 40 mg via ORAL
  Filled 2018-02-11 (×2): qty 1

## 2018-02-11 MED ORDER — CELECOXIB 400 MG PO CAPS
400.0000 mg | ORAL_CAPSULE | Freq: Once | ORAL | Status: AC
  Administered 2018-02-11: 400 mg via ORAL
  Filled 2018-02-11: qty 1

## 2018-02-11 MED ORDER — FENTANYL CITRATE (PF) 100 MCG/2ML IJ SOLN
INTRAMUSCULAR | Status: DC | PRN
  Administered 2018-02-11: 25 ug via INTRAVENOUS
  Administered 2018-02-11: 30 ug via INTRAVENOUS
  Administered 2018-02-11: 25 ug via INTRAVENOUS
  Administered 2018-02-11: 20 ug via INTRATHECAL

## 2018-02-11 MED ORDER — MORPHINE SULFATE (PF) 2 MG/ML IV SOLN: 0.5000 mg | INTRAVENOUS | Status: DC | PRN

## 2018-02-11 MED ORDER — GABAPENTIN 300 MG PO CAPS
300.0000 mg | ORAL_CAPSULE | Freq: Three times a day (TID) | ORAL | Status: DC
  Administered 2018-02-11 – 2018-02-13 (×6): 300 mg via ORAL
  Filled 2018-02-11 (×6): qty 1

## 2018-02-11 MED ORDER — SUCCINYLCHOLINE CHLORIDE 20 MG/ML IJ SOLN
INTRAMUSCULAR | Status: AC
  Filled 2018-02-11: qty 1

## 2018-02-11 MED ORDER — 0.9 % SODIUM CHLORIDE (POUR BTL) OPTIME
TOPICAL | Status: DC | PRN
  Administered 2018-02-11: 1000 mL

## 2018-02-11 MED ORDER — BUPIVACAINE LIPOSOME 1.3 % IJ SUSP
INTRAMUSCULAR | Status: AC
  Filled 2018-02-11: qty 20

## 2018-02-11 MED ORDER — BISACODYL 5 MG PO TBEC
5.0000 mg | DELAYED_RELEASE_TABLET | Freq: Every day | ORAL | Status: DC | PRN
Start: 2018-02-11 — End: 2018-02-13

## 2018-02-11 MED ORDER — MIDAZOLAM HCL 2 MG/2ML IJ SOLN
INTRAMUSCULAR | Status: AC
  Filled 2018-02-11: qty 2

## 2018-02-11 MED ORDER — BUPIVACAINE IN DEXTROSE 0.75-8.25 % IT SOLN
INTRATHECAL | Status: DC | PRN
  Administered 2018-02-11: 14 mg via INTRATHECAL

## 2018-02-11 MED ORDER — BUPIVACAINE LIPOSOME 1.3 % IJ SUSP
INTRAMUSCULAR | Status: DC | PRN
  Administered 2018-02-11: 60 mL

## 2018-02-11 MED ORDER — SUCRALFATE 1 G PO TABS: 1.0000 g | ORAL_TABLET | Freq: Four times a day (QID) | ORAL | Status: DC | PRN

## 2018-02-11 MED ORDER — CELECOXIB 100 MG PO CAPS: 200.0000 mg | ORAL_CAPSULE | Freq: Two times a day (BID) | ORAL | Status: DC

## 2018-02-11 MED ORDER — SODIUM CHLORIDE 0.9 % IJ SOLN
INTRAMUSCULAR | Status: AC
  Filled 2018-02-11: qty 10

## 2018-02-11 MED ORDER — DICLOFENAC SODIUM 75 MG PO TBEC: 75.0000 mg | DELAYED_RELEASE_TABLET | Freq: Two times a day (BID) | ORAL | Status: DC

## 2018-02-11 MED ORDER — BUPIVACAINE-EPINEPHRINE (PF) 0.5% -1:200000 IJ SOLN
INTRAMUSCULAR | Status: DC | PRN
  Administered 2018-02-11: 20 mL via PERINEURAL

## 2018-02-11 MED ORDER — ONDANSETRON HCL 4 MG/2ML IJ SOLN
4.0000 mg | Freq: Four times a day (QID) | INTRAMUSCULAR | Status: DC | PRN
  Administered 2018-02-11: 4 mg via INTRAVENOUS
  Filled 2018-02-11: qty 2

## 2018-02-11 SURGICAL SUPPLY — 64 items
BANDAGE ELASTIC 4 LF NS (GAUZE/BANDAGES/DRESSINGS) ×4 IMPLANT
BANDAGE ELASTIC 6 LF NS (GAUZE/BANDAGES/DRESSINGS) ×2 IMPLANT
BANDAGE ESMARK 6X9 LF (GAUZE/BANDAGES/DRESSINGS) ×1 IMPLANT
BIT DRILL 3.2X128 (BIT) IMPLANT
BLADE HEX COATED 2.75 (ELECTRODE) ×2 IMPLANT
BLADE SAGITTAL 25.0X1.27X90 (BLADE) ×2 IMPLANT
BNDG ESMARK 6X9 LF (GAUZE/BANDAGES/DRESSINGS) ×2
CAP KNEE TOTAL 3 SIGMA ×2 IMPLANT
CEMENT HV SMART SET (Cement) ×4 IMPLANT
CLOTH BEACON ORANGE TIMEOUT ST (SAFETY) ×2 IMPLANT
COOLER CRYO CUFF IC AND MOTOR (MISCELLANEOUS) ×2 IMPLANT
COVER LIGHT HANDLE STERIS (MISCELLANEOUS) ×4 IMPLANT
CUFF CRYO KNEE18X23 MED (MISCELLANEOUS) ×2 IMPLANT
CUFF TOURNIQUET SINGLE 34IN LL (TOURNIQUET CUFF) ×2 IMPLANT
DECANTER SPIKE VIAL GLASS SM (MISCELLANEOUS) ×4 IMPLANT
DRAPE BACK TABLE (DRAPES) ×2 IMPLANT
DRAPE EXTREMITY T 121X128X90 (DRAPE) ×2 IMPLANT
DRSG MEPILEX BORDER 4X12 (GAUZE/BANDAGES/DRESSINGS) ×2 IMPLANT
DURAPREP 26ML APPLICATOR (WOUND CARE) ×4 IMPLANT
ELECT REM PT RETURN 9FT ADLT (ELECTROSURGICAL) ×2
ELECTRODE REM PT RTRN 9FT ADLT (ELECTROSURGICAL) ×1 IMPLANT
EVACUATOR 3/16  PVC DRAIN (DRAIN) ×1
EVACUATOR 3/16 PVC DRAIN (DRAIN) ×1 IMPLANT
GLOVE BIO SURGEON STRL SZ7 (GLOVE) ×6 IMPLANT
GLOVE BIOGEL PI IND STRL 7.0 (GLOVE) ×5 IMPLANT
GLOVE BIOGEL PI INDICATOR 7.0 (GLOVE) ×5
GLOVE SKINSENSE NS SZ8.0 LF (GLOVE) ×2
GLOVE SKINSENSE STRL SZ8.0 LF (GLOVE) ×2 IMPLANT
GLOVE SS N UNI LF 8.5 STRL (GLOVE) ×2 IMPLANT
GOWN STRL REUS W/ TWL LRG LVL3 (GOWN DISPOSABLE) ×1 IMPLANT
GOWN STRL REUS W/TWL LRG LVL3 (GOWN DISPOSABLE) ×5 IMPLANT
GOWN STRL REUS W/TWL XL LVL3 (GOWN DISPOSABLE) ×2 IMPLANT
HANDPIECE INTERPULSE COAX TIP (DISPOSABLE) ×1
HOOD W/PEELAWAY (MISCELLANEOUS) ×8 IMPLANT
INST SET MAJOR BONE (KITS) ×2 IMPLANT
IV NS IRRIG 3000ML ARTHROMATIC (IV SOLUTION) ×2 IMPLANT
KIT BLADEGUARD II DBL (SET/KITS/TRAYS/PACK) ×2 IMPLANT
KIT TURNOVER CYSTO (KITS) ×2 IMPLANT
MANIFOLD NEPTUNE II (INSTRUMENTS) ×2 IMPLANT
MARKER SKIN DUAL TIP RULER LAB (MISCELLANEOUS) ×2 IMPLANT
NEEDLE HYPO 21X1.5 SAFETY (NEEDLE) ×2 IMPLANT
NS IRRIG 1000ML POUR BTL (IV SOLUTION) ×2 IMPLANT
PACK TOTAL JOINT (CUSTOM PROCEDURE TRAY) ×2 IMPLANT
PAD ARMBOARD 7.5X6 YLW CONV (MISCELLANEOUS) ×2 IMPLANT
PAD DANNIFLEX CPM (ORTHOPEDIC SUPPLIES) ×2 IMPLANT
PILLOW KNEE EXTENSION 0 DEG (MISCELLANEOUS) ×2 IMPLANT
PIN TROCAR 3 INCH (PIN) IMPLANT
SAW OSC TIP CART 19.5X105X1.3 (SAW) ×2 IMPLANT
SET BASIN LINEN APH (SET/KITS/TRAYS/PACK) ×2 IMPLANT
SET HNDPC FAN SPRY TIP SCT (DISPOSABLE) ×1 IMPLANT
STAPLER VISISTAT 35W (STAPLE) ×2 IMPLANT
SUT BRALON NAB BRD #1 30IN (SUTURE) ×4 IMPLANT
SUT MNCRL 0 VIOLET CTX 36 (SUTURE) ×1 IMPLANT
SUT MON AB 0 CT1 (SUTURE) ×4 IMPLANT
SUT MON AB 2-0 CT1 36 (SUTURE) IMPLANT
SUT MONOCRYL 0 CTX 36 (SUTURE) ×1
SYR 20CC LL (SYRINGE) ×6 IMPLANT
SYR 30ML LL (SYRINGE) ×2 IMPLANT
SYR BULB IRRIGATION 50ML (SYRINGE) ×2 IMPLANT
TOWEL OR 17X26 4PK STRL BLUE (TOWEL DISPOSABLE) ×2 IMPLANT
TOWER CARTRIDGE SMART MIX (DISPOSABLE) ×2 IMPLANT
TRAY FOLEY METER SIL LF 16FR (CATHETERS) ×2 IMPLANT
WATER STERILE IRR 1000ML POUR (IV SOLUTION) ×4 IMPLANT
YANKAUER SUCT 12FT TUBE ARGYLE (SUCTIONS) ×2 IMPLANT

## 2018-02-11 NOTE — Evaluation (Signed)
Physical Therapy Evaluation Patient Details Name: Tiffany Bradley MRN: [id number] DOB: 11/15/1945 Today's Date: 02/11/2018   LEFT KNEE ROM:  1-85 degrees AMBULATION DISTANCE: 20 feet with Min assist using RW    History of Present Illness  Tiffany Bradley a 72 y/o female, s/p Left TKA 02/11/18  with the diagnosis of left knee osteoarthritis    Clinical Impression  Patient demonstrates slow labored movement for sitting at bedside, verbal cues for leaning forward during sit to stands, limited for gait due to c/o increased left knee pain when weight bearing and c/o nausea and tolerated sitting up in chair with LLE dangling with ice pack on left knee after therapy - RN notified.  Patient will benefit from continued physical therapy in hospital and recommended venue below to increase strength, balance, endurance for safe ADLs and gait.    Follow Up Recommendations Home health PT;Supervision for mobility/OOB    Equipment Recommendations  None recommended by PT    Recommendations for Other Services       Precautions / Restrictions Precautions Precautions: Fall Restrictions Weight Bearing Restrictions: Yes LLE Weight Bearing: Weight bearing as tolerated Other Position/Activity Restrictions: no pillows underneath left knee      Mobility  Bed Mobility Overal bed mobility: Needs Assistance Bed Mobility: Supine to Sit     Supine to sit: Min assist     General bed mobility comments: slow labored movement with VC's to prop up on elbows  Transfers Overall transfer level: Needs assistance Equipment used: Rolling walker (2 wheeled) Transfers: Sit to/from [employer] CorporationStand;Stand Pivot Transfers Sit to Stand: Min assist Stand pivot transfers: Min assist       General transfer comment: slow labored   Ambulation/Gait Ambulation/Gait assistance: Min assist Gait Distance (Feet): 20 Feet Assistive device: Rolling walker (2 wheeled) Gait Pattern/deviations: Decreased step length - left;Decreased stance  time - left;Decreased stride length;Antalgic Gait velocity: slow   General Gait Details: demonstrates slow labored cadence with limited weight bearing on LLE due to increased pain, decreased heel strike, limited secondary to increased pain  Stairs            Wheelchair Mobility    Modified Rankin (Stroke Patients Only)       Balance Overall balance assessment: Needs assistance Sitting-balance support: Feet supported;No upper extremity supported Sitting balance-Leahy Scale: Good     Standing balance support: Bilateral upper extremity supported;During functional activity Standing balance-Leahy Scale: Fair                               Pertinent Vitals/Pain Pain Assessment: 0-10 Pain Score: 9  Pain Location: left knee Pain Descriptors / Indicators: Aching;Sore;Discomfort Pain Intervention(s): Limited activity within patient's tolerance;Monitored during session;Premedicated before session    Home Living Family/patient expects to be discharged to:: Private residence Living Arrangements: Children(daughter) Available Help at Discharge: Family Type of Home: House Home Access: Stairs to enter Entrance Stairs-Rails: Left(going up) Entrance Stairs-Number of Steps: 2-3 Home Layout: Multi-level(split level) Home Equipment: Cane - single point;Walker - 2 wheels;Bedside commode      Prior Function Level of Independence: Independent with assistive device(s)         Comments: household and short distanced community, uses cane for going up stairs     Hand Dominance        Extremity/Trunk Assessment   Upper Extremity Assessment Upper Extremity Assessment: Overall WFL for tasks assessed    Lower Extremity Assessment Lower Extremity Assessment: Overall [facility]  for tasks assessed;LLE deficits/detail LLE Deficits / Details: grossly 3+/5    Cervical / Trunk Assessment Cervical / Trunk Assessment: Normal  Communication   Communication: No difficulties   Cognition Arousal/Alertness: Awake/alert Behavior During Therapy: WFL for tasks assessed/performed Overall Cognitive Status: Within Functional Limits for tasks assessed                                        General Comments      Exercises Total Joint Exercises Ankle Circles/Pumps: Supine;AROM;Strengthening;Left;5 reps Quad Sets: Supine;AROM;Strengthening;Left;5 reps Gluteal Sets: Supine;AROM;Strengthening;Both;5 reps Short Arc Quad: Supine;AROM;Strengthening;Left;5 reps Heel Slides: Supine;AROM;Strengthening;Left;5 reps   Assessment/Plan    PT Assessment Patient needs continued PT services  PT Problem List Decreased strength;Decreased range of motion;Decreased activity tolerance;Decreased balance;Decreased mobility       PT Treatment Interventions Gait training;Stair training;Functional mobility training;Therapeutic activities;Therapeutic exercise;Patient/family education    PT Goals (Current goals can be found in the Care Plan section)  Acute Rehab PT Goals Patient Stated Goal: return home with family to assist PT Goal Formulation: With patient/family Time For Goal Achievement: 02/14/18 Potential to Achieve Goals: Good    Frequency 7X/week   Barriers to discharge        Co-evaluation               AM-PAC PT "6 Clicks" Daily Activity  Outcome Measure Difficulty turning over in bed (including adjusting bedclothes, sheets and blankets)?: A Little Difficulty moving from lying on back to sitting on the side of the bed? : A Lot Difficulty sitting down on and standing up from a chair with arms (e.g., wheelchair, bedside commode, etc,.)?: A Little Help needed moving to and from a bed to chair (including a wheelchair)?: A Little Help needed walking in hospital room?: A Lot Help needed climbing 3-5 steps with a railing? : A Lot 6 Click Score: 15    End of Session   Activity Tolerance: Patient tolerated treatment well;Patient limited by  fatigue Patient left: in chair;with call bell/phone within reach;with restraints reapplied Nurse Communication: Mobility status;Other (comment)(RN notified that patient left up in chair) PT Visit Diagnosis: Unsteadiness on feet (R26.81);Other abnormalities of gait and mobility (R26.89);Muscle weakness (generalized) (M62.81)    Time: 1610-9604 PT Time Calculation (min) (ACUTE ONLY): 33 min   Charges:   PT Evaluation $PT Eval Moderate Complexity: 1 Mod PT Treatments $Therapeutic Activity: 23-37 mins   PT G Codes:        4:16 PM, February 14, 2018 Ocie Bob, MPT Physical Therapist with [facility] [phone] office [phone] mobile phone

## 2018-02-11 NOTE — Anesthesia Postprocedure Evaluation (Signed)
Anesthesia Post Note  [name]  Procedure(s) Performed: TOTAL KNEE ARTHROPLASTY (Left Knee)  Patient location during evaluation: PACU Anesthesia Type: Spinal Level of consciousness: awake and alert and oriented Pain management: pain level controlled Vital Signs Assessment: post-procedure vital signs reviewed and stable Respiratory status: spontaneous breathing Cardiovascular status: stable Postop Assessment: no apparent nausea or vomiting and spinal receding Anesthetic complications: no     Last Vitals:  Vitals:   02/11/18 0945 02/11/18 1000  BP: 113/75 (!) 111/95  Pulse: 68 80  Resp: 14 16  Temp:    SpO2: 100% (!) 80%    Last Pain:  Vitals:   02/11/18 0924  TempSrc:   PainSc: 0-No pain                 River Mckercher A

## 2018-02-11 NOTE — Anesthesia Procedure Notes (Signed)
Spinal  Patient location during procedure: OR Start time: 02/11/2018 7:35 AM End time: 02/11/2018 7:39 AM Staffing Anesthesiologist: Arbie Cookeyameransi, Delante Karapetyan G, MD Preanesthetic Checklist Completed: patient identified, site marked, surgical consent, pre-op evaluation, timeout performed, IV checked, risks and benefits discussed and monitors and equipment checked Spinal Block Patient position: sitting Prep: Betadine Patient monitoring: heart rate, continuous pulse ox and blood pressure Approach: midline Location: L3-4 Injection technique: single-shot Needle Needle type: Pencan  Needle gauge: 24 G Needle length: 10 cm Needle insertion depth: 5 cm Assessment Sensory level: T8 Additional Notes Seater,prep,drape.1cc1% at approxL3/4. #24 pencanxone,CLEAR CSF at half and fulldose.Heme/P negative. IT Bupiv 14mg ,fent 20mcg.  Supine with VSS.

## 2018-02-11 NOTE — Anesthesia Procedure Notes (Signed)
Date/Time: 02/11/2018 7:29 AM Performed by: Pernell DupreAdams, Amy A, CRNA Pre-anesthesia Checklist: Patient identified, Emergency Drugs available, Suction available, Patient being monitored and Timeout performed Oxygen Delivery Method: Nasal cannula

## 2018-02-11 NOTE — Plan of Care (Signed)
  Problem: Acute Rehab PT Goals(only PT should resolve) Goal: Pt Will Go Supine/Side To Sit Outcome: Progressing Flowsheets (Taken 02/11/2018 1618) Pt will go Supine/Side to Sit: with min guard assist Goal: Patient Will Transfer Sit To/From Stand Outcome: Progressing Flowsheets (Taken 02/11/2018 1618) Patient will transfer sit to/from stand: with supervision Goal: Pt Will Transfer Bed To Chair/Chair To Bed Outcome: Progressing Flowsheets (Taken 02/11/2018 1618) Pt will Transfer Bed to Chair/Chair to Bed: with supervision Goal: Pt Will Ambulate Outcome: Progressing Flowsheets (Taken 02/11/2018 1618) Pt will Ambulate: 75 feet;with min guard assist;with rolling walker   4:18 PM, 02/11/18 Ocie BobJames Clifton Kovacic, MPT Physical Therapist [facility] [phone] office [phone] mobile phone

## 2018-02-11 NOTE — Brief Op Note (Signed)
02/11/2018  9:20 AM  PATIENT:  Tiffany Bradley  72 y.o. female  PRE-OPERATIVE DIAGNOSIS:  left knee osteoarthritis  POST-OPERATIVE DIAGNOSIS:  left knee osteoarthritis  PROCEDURE:  Procedure(s) with comments: TOTAL KNEE ARTHROPLASTY (Left) - DePuy  - [id number]  Operative findings complete cartilage loss lateral femoral condyle 50% surface loss tibial plateau laterally grade 2 arthritis patella normal medial compartment  There was no posterior lateral femoral condyle deficiency  Implants Depew fixed-bearing Sigma total knee Femur size 2.5 Tibia size 2.5 Patella 32 x 8 Polyethylene posterior stabilized 10  SURGEON:  Surgeon(s) and Role:    Vickki Hearing* Iyonna Rish E, MD - Primary  PHYSICIAN ASSISTANT:   ASSISTANTS: [place] NationBetty Ashley and Canary Brimynthia Wren  ANESTHESIA:   spinal  EBL:  10 mL   BLOOD ADMINISTERED:none  DRAINS: none   LOCAL MEDICATIONS USED:  MARCAINE   , Amount: 30 ml with epinephrine and OTHER exparel 20 diluted with 40 cc saline   SPECIMEN:  No Specimen  DISPOSITION OF SPECIMEN:  N/A  COUNTS:  YES  TOURNIQUET:   Total Tourniquet Time Documented: Thigh (Left) - 72 minutes Total: Thigh (Left) - 72 minutes   DICTATION: .Reubin Milanragon Dictation  PLAN OF CARE: Admit to inpatient   PATIENT DISPOSITION:  PACU - hemodynamically stable.   Delay start of Pharmacological VTE agent (>24hrs) due to surgical blood loss or risk of bleeding: yes

## 2018-02-11 NOTE — Op Note (Signed)
02/11/2018  9:20 AM  PATIENT:  Tiffany Bradley  72 y.o. female  PRE-OPERATIVE DIAGNOSIS:  left knee osteoarthritis  POST-OPERATIVE DIAGNOSIS:  left knee osteoarthritis  PROCEDURE:  Procedure(s) with comments: TOTAL KNEE ARTHROPLASTY (Left) - DePuy  - [phone]  Operative findings complete cartilage loss lateral femoral condyle 50% surface loss tibial plateau laterally grade 2 arthritis patella normal medial compartment  There was no posterior lateral femoral condyle deficiency  Implants Depew fixed-bearing Sigma total knee Femur size 2.5 Tibia size 2.5 Patella 32 x 8 Polyethylene posterior stabilized 10  SURGEON:  Surgeon(s) and Role:    Vickki Hearing* Harrison, Stanley E, MD - Primary  PHYSICIAN ASSISTANT:   ASSISTANTS: Ogema NationBetty Ashley and Canary Brimynthia Wren  ANESTHESIA:   spinal  EBL:  10 mL   BLOOD ADMINISTERED:none  DRAINS: none   LOCAL MEDICATIONS USED:  MARCAINE   , Amount: 30 ml with epinephrine and OTHER exparel 20 diluted with 40 cc saline   SPECIMEN:  No Specimen  DISPOSITION OF SPECIMEN:  N/A  COUNTS:  YES  TOURNIQUET:   Total Tourniquet Time Documented: Thigh (Left) - 72 minutes Total: Thigh (Left) - 72 minutes   DICTATION: .Reubin Milanragon Dictation  Total knee replacement dictation for left knee  The patient was identified in the preop area as even more her left knee was confirmed as the surgical site of complete chart review was completed update completed surgical site marked his left knee.  She was then taken to the operating room where she had a spinal anesthetic which was successful.  She was placed supine and a Foley catheter was placed sterilely.  Her left leg was then prepped and draped sterilely.  The timeout was completed.  Implants were in place x-rays were up all surgical team members agreed on the procedure.  We then exsanguinated the limb with a 6 inch Esmarch the tourniquet was elevated for 72 minutes at 300 mmHg  The knee was incised with the knee in flexion the  extensor mechanism was identified and medial arthrotomy was performed synovectomy was completed and the medial meniscus anterior horn lateral meniscus anterior horn were resected the ACL and PCL were removed the interval between the patellar tendon and lateral compartment soft tissue sleeve was released the patella subluxated easily laterally  A drill bit was placed in the femoral canal the canal was suctioned free and the femoral distal cut was set for 10 mm 5 degree left and pinned in place.  The bone was removed.  We then placed the tibial external alignment guide set the resection for neutral varus valgus cut on the tibia and removed 4 mm from the lateral plateau.  The remaining medial and lateral meniscal horns were also resected at this time.  Extension gap was checked with a 10 mm block and the extension gap was equal stable with the knee in full extension  We then measured the femur.  It measured between the 2.5 and a 2.0.  We pin for a 2.5 and made the anterior and posterior cuts.  We then placed a spacer block in the flexion gap with a 10 mm block and it was found to be stable.  Completed the remaining chamfer cuts  We then prepared the femoral notch with the appropriate cutting guide.  We then did a trial reduction with a 2.5 femur 2.5 tibia and a 10 mm trial insert.  The knee came to full extension passive flexion test was 120 degrees.  The patella tracked normally.  We then  prepared the proximal tibia with reaming and punching per manual fracture technique with the tibial tray rotated appropriately avoiding internal rotation.  We then prepared the patella.  The patella measured 21 mm in thickness we removed 9 mm of bone and replaced it with a 32 x 8 mm patella button.  Trial button fit nicely.  We then irrigated the joint injected exparel around the joint injected Marcaine with epinephrine around the joint dried all the bone and then cemented the implants in place.  Excess bone was  removed.  The cement was allowed to cure.  Final irrigation was performed.  The trial insert was removed and the final insert was placed.  Her range of motion was 0 to 120 degrees the knee was stable in flexion and extension with normal patella tracking  Wound closure was done in flexion with #1 [name] suture in interrupted fashion.  Subcutaneous tissue was closed with 0 Monocryl in running fashion.  Skin staples were used to reapproximate the skin edge.  Sterile dressing and Ace bandages were applied along with a Cryo/Cuff which was activated.  The patient was taken to recovery room in stable condition  PLAN OF CARE: Admit to inpatient   PATIENT DISPOSITION:  PACU - hemodynamically stable.   Delay start of Pharmacological VTE agent (>24hrs) due to surgical blood loss or risk of bleeding: yes

## 2018-02-11 NOTE — Anesthesia Preprocedure Evaluation (Signed)
Anesthesia Evaluation  Patient identified by MRN, date of birth, ID band Patient awake    Reviewed: Allergy & Precautions, H&P , NPO status , Patient's Chart, lab work & pertinent test results, reviewed documented beta blocker date and time   Airway Mallampati: III  TM Distance: >3 FB Neck ROM: full    Dental no notable dental hx. (+) Lower Dentures, Upper Dentures   Pulmonary neg pulmonary ROS, former smoker,    Pulmonary exam normal breath sounds clear to auscultation       Cardiovascular Exercise Tolerance: Good hypertension, On Medications negative cardio ROS   Rhythm:regular Rate:Normal     Neuro/Psych negative neurological ROS  negative psych ROS   GI/Hepatic negative GI ROS, Neg liver ROS, PUD, GERD  ,  Endo/Other  negative endocrine ROS  Renal/GU negative Renal ROS  negative genitourinary   Musculoskeletal   Abdominal   Peds  Hematology negative hematology ROS (+) anemia ,   Anesthesia Other Findings hgb 10.6  plt 318 NSR at 83  Reproductive/Obstetrics negative OB ROS                             Anesthesia Physical Anesthesia Plan  ASA: III  Anesthesia Plan: Spinal   Post-op Pain Management:    Induction:   PONV Risk Score and Plan:   Airway Management Planned:   Additional Equipment:   Intra-op Plan:   Post-operative Plan:   Informed Consent: I have reviewed the patients History and Physical, chart, labs and discussed the procedure including the risks, benefits and alternatives for the proposed anesthesia with the patient or authorized representative who has indicated his/her understanding and acceptance.   Dental Advisory Given  Plan Discussed with: CRNA and Anesthesiologist  Anesthesia Plan Comments:         Anesthesia Quick Evaluation

## 2018-02-11 NOTE — Interval H&P Note (Signed)
History and Physical Interval Note:  02/11/2018 7:25 AM  Tiffany Bradley  has presented today for surgery, with the diagnosis of left knee osteoarthritis  The various methods of treatment have been discussed with the patient and family. After consideration of risks, benefits and other options for treatment, the patient has consented to  Procedure(s) with comments: TOTAL KNEE ARTHROPLASTY (Left) - DePuy  as a surgical intervention .  The patient's history has been reviewed, patient examined, no change in status, stable for surgery.  I have reviewed the patient's chart and labs.  Questions were answered to the patient's satisfaction.     Fuller CanadaStanley Kiarrah Rausch

## 2018-02-11 NOTE — Transfer of Care (Signed)
Immediate Anesthesia Transfer of Care Note  [name]  Procedure(s) Performed: TOTAL KNEE ARTHROPLASTY (Left Knee)  Patient Location: PACU  Anesthesia Type:Spinal  Level of Consciousness: awake, alert , oriented and patient cooperative  Airway & Oxygen Therapy: Patient Spontanous Breathing  Post-op Assessment: Report given to RN and Post -op Vital signs reviewed and stable  Post vital signs: Reviewed and stable  Last Vitals:  Vitals Value Taken Time  BP    Temp    Pulse 96 02/11/2018  9:25 AM  Resp 19 02/11/2018  9:25 AM  SpO2 83 % 02/11/2018  9:25 AM  Vitals shown include unvalidated device data.  Last Pain:  Vitals:   02/11/18 0655  TempSrc: Oral  PainSc: 8       Patients Stated Pain Goal: 6 (02/11/18 16100655)  Complications: No apparent anesthesia complications

## 2018-02-12 ENCOUNTER — Encounter (HOSPITAL_COMMUNITY): Payer: Self-pay | Admitting: Orthopedic Surgery

## 2018-02-12 LAB — CBC
HCT: 29.9 % — ABNORMAL LOW (ref 36.0–46.0)
Hemoglobin: 9.5 g/dL — ABNORMAL LOW (ref 12.0–15.0)
MCH: 25.9 pg — AB (ref 26.0–34.0)
MCHC: 31.8 g/dL (ref 30.0–36.0)
MCV: 81.5 fL (ref 78.0–100.0)
PLATELETS: 278 10*3/uL (ref 150–400)
RBC: 3.67 MIL/uL — ABNORMAL LOW (ref 3.87–5.11)
RDW: 15.6 % — ABNORMAL HIGH (ref 11.5–15.5)
WBC: 7.9 10*3/uL (ref 4.0–10.5)

## 2018-02-12 LAB — BASIC METABOLIC PANEL
Anion gap: 7 (ref 5–15)
BUN: 10 mg/dL (ref 6–20)
CO2: 28 mmol/L (ref 22–32)
CREATININE: 0.55 mg/dL (ref 0.44–1.00)
Calcium: 8.7 mg/dL — ABNORMAL LOW (ref 8.9–10.3)
Chloride: 102 mmol/L (ref 101–111)
GFR calc Af Amer: >60 mL/min (ref >60)
GFR calc non Af Amer: >60 mL/min (ref >60)
Glucose, Bld: 132 mg/dL — ABNORMAL HIGH (ref 65–99)
Potassium: 4 mmol/L (ref 3.5–5.1)
Sodium: 137 mmol/L (ref 135–145)

## 2018-02-12 NOTE — Progress Notes (Signed)
Subjective: 1 Day Post-Op Procedure(s) (LRB): TOTAL KNEE ARTHROPLASTY (Left) Patient reports pain as 1 on 0-10 scale and 2 on 0-10 scale.    Objective: Vital signs in last 24 hours: Temp:  [97.2 F (36.2 C)-98.3 F (36.8 C)] 98.3 F (36.8 C) (06/14 1356) Pulse Rate:  [66-85] 81 (06/14 1356) Resp:  [18] 18 (06/14 1356) BP: (107-134)/(72-78) 134/75 (06/14 1356) SpO2:  [97 %-99 %] 98 % (06/14 1356)  Intake/Output from previous day: 06/13 0701 - 06/14 0700 In: 3228.3 [P.O.:580; I.V.:2475; IV Piggyback:173.3] Out: 2060 [Urine:2050; Blood:10] Intake/Output this shift: Total I/O In: 480 [P.O.:480] Out: -   Recent Labs    02/12/18 0516  HGB 9.5*   Recent Labs    02/12/18 0516  WBC 7.9  RBC 3.67*  HCT 29.9*  PLT 278   Recent Labs    02/12/18 0516  NA 137  K 4.0  CL 102  CO2 28  BUN 10  CREATININE 0.55  GLUCOSE 132*  CALCIUM 8.7*   No results for input(s): LABPT, INR in the last 72 hours.  Neurologically intact Neurovascular intact Sensation intact distally Intact pulses distally Dorsiflexion/Plantar flexion intact Compartment soft    Assessment/Plan: 1 Day Post-Op Procedure(s) (LRB): TOTAL KNEE ARTHROPLASTY (Left) Advance diet Up with therapy D/C IV fluids Plan for discharge tomorrow    Tiffany Bradley 02/12/2018, 4:51 PM

## 2018-02-12 NOTE — Care Management Important Message (Signed)
Important Message  Patient Details  Name: Tiffany Bradley MRN: [id number] Date of Birth: 07/24/1946   Medicare Important Message Given:  Yes    [name], Shweta Aman Demske, RN 02/12/2018, 2:01 PM

## 2018-02-12 NOTE — Care Management Note (Signed)
Case Management Note  Patient Details  Name: Tiffany Bradley H Mckeon MRN: [id number] Date of Birth: 03/05/1946  Subjective/Objective:            S/P TKR.        Action/Plan[facility] and [facility] will be provided by [facility] ([facility] does not accept payor). Pt aware. HH should see pt day after DC. Pt's CPM, BSC and RW has been delivered pta by [facility]. No other needs communicated or ID.   Expected Discharge Date:       02/13/18           Expected Discharge Plan:  Home w Home Health Services  In-House Referral:  NA  Discharge planning Services  CM Consult  Post Acute Care Choice:  Home Health Choice offered to:  NA  HH Arranged:  PT HH [facility]  [facility]  Status of Service:  Completed, signed off  [name], Teressa Mcglocklin Demske, RN 02/12/2018, 1:59 PM

## 2018-02-12 NOTE — Anesthesia Postprocedure Evaluation (Signed)
Anesthesia Post Note  [name]  Procedure(s) Performed: TOTAL KNEE ARTHROPLASTY (Left Knee)  Patient location during evaluation: Nursing Unit Anesthesia Type: Spinal Level of consciousness: awake and alert and oriented Pain management: pain level controlled Vital Signs Assessment: post-procedure vital signs reviewed and stable Respiratory status: spontaneous breathing Cardiovascular status: stable : Some nausea yesterday relieved after Zofran. Anesthetic complications: no     Last Vitals:  Vitals:   02/11/18 2107 02/12/18 0514  BP: 119/78 107/73  Pulse: 80 85  Resp:    Temp: 36.6 C (!) 36.4 C  SpO2: 99% 97%    Last Pain:  Vitals:   02/12/18 0514  TempSrc: Oral  PainSc:                  Micheil Klaus A

## 2018-02-12 NOTE — Clinical Social Work Note (Signed)
Patient discharging with HHPT.   LCSW signing off.     [name], Juleen ChinaHeather D, LCSW r

## 2018-02-12 NOTE — Progress Notes (Signed)
Physical Therapy Treatment Patient Details Name: Tiffany Sarva H Steury MRN: [id number] DOB: 12/27/1945 Today's Date: 02/12/2018   LEFT KNEE ROM:  0-92 degrees AMBULATION DISTANCE: 70 feet with Supervision using RW CPM: 0-90 DEGREES     History of Present Illness Tiffany Bradley a 72 y/o female, s/p Left TKA 02/11/18  with the diagnosis of left knee osteoarthritis      PT Comments    Patient presents up in chair and agreeable for therapy.  Patient demonstrates increased left knee strength for extending against gravity while seated at bedside, able to self stretch left knee flexion up to 92 degrees using RLE, had increased endurance/distance for gait training with fair/good return for left heel to toe stepping.  Patient instructed in use and tolerated CPM set up at 0-90 degrees and encouraged to increase as tolerated.   Follow Up Recommendations  Home health PT;Supervision for mobility/OOB     Equipment Recommendations  None recommended by PT    Recommendations for Other Services       Precautions / Restrictions Precautions Precautions: Fall Restrictions Weight Bearing Restrictions: Yes LLE Weight Bearing: Weight bearing as tolerated Other Position/Activity Restrictions: no pillows underneath left knee    Mobility  Bed Mobility Overal bed mobility: Needs Assistance Bed Mobility: Sit to Supine       Sit to supine: Supervision   General bed mobility comments: good return for lifting LLE onto bed  Transfers Overall transfer level: Needs assistance Equipment used: Rolling walker (2 wheeled) Transfers: Sit to/from [employer] CorporationStand;Stand Pivot Transfers Sit to Stand: Supervision Stand pivot transfers: Supervision       General transfer comment: verbal cues for proper hand placement during sit to stands  Ambulation/Gait Ambulation/Gait assistance: Supervision Gait Distance (Feet): 70 Feet Assistive device: Rolling walker (2 wheeled) Gait Pattern/deviations: Decreased step length -  left;Decreased stance time - left;Decreased stride length;Antalgic Gait velocity: slow   General Gait Details: demonstrates increased endurance/distance with slow slightly labored cadence with fair/good return for left heel to toe stepping after VC's, limited secondary to fatigue and increasing pain left knee   Stairs             Wheelchair Mobility    Modified Rankin (Stroke Patients Only)       Balance Overall balance assessment: Needs assistance Sitting-balance support: Feet supported;No upper extremity supported Sitting balance-Leahy Scale: Good     Standing balance support: Bilateral upper extremity supported;During functional activity Standing balance-Leahy Scale: Fair                              Cognition Arousal/Alertness: Awake/alert Behavior During Therapy: WFL for tasks assessed/performed Overall Cognitive Status: Within Functional Limits for tasks assessed                                        Exercises Total Joint Exercises Quad Sets: AROM;Strengthening;Left;10 reps;Seated Long Arc Quad: Seated;AROM;Strengthening;Left;10 reps Goniometric ROM: left knee: 0-92 degrees General Exercises - Lower Extremity Hip Flexion/Marching: Seated;AROM;Strengthening;Both;10 reps Toe Raises: Seated;AROM;Strengthening;Both;10 reps Heel Raises: Seated;AROM;Strengthening;Both;10 reps    General Comments        Pertinent Vitals/Pain Pain Assessment: 0-10 Pain Score: 4  Pain Location: left knee Pain Descriptors / Indicators: Aching;Sore;Discomfort Pain Intervention(s): Limited activity within patient's tolerance;Monitored during session    Home Living  Prior Function            PT Goals (current goals can now be found in the care plan section) Acute Rehab PT Goals Patient Stated Goal: return home with family to assist PT Goal Formulation: With patient/family Time For Goal Achievement:  02/14/18 Potential to Achieve Goals: Good Progress towards PT goals: Progressing toward goals    Frequency    7X/week      PT Plan Current plan remains appropriate    Co-evaluation              AM-PAC PT "6 Clicks" Daily Activity  Outcome Measure  Difficulty turning over in bed (including adjusting bedclothes, sheets and blankets)?: None Difficulty moving from lying on back to sitting on the side of the bed? : None Difficulty sitting down on and standing up from a chair with arms (e.g., wheelchair, bedside commode, etc,.)?: A Little Help needed moving to and from a bed to chair (including a wheelchair)?: A Little Help needed walking in hospital room?: A Little Help needed climbing 3-5 steps with a railing? : A Lot 6 Click Score: 19    End of Session   Activity Tolerance: Patient tolerated treatment well;Patient limited by fatigue;Patient limited by pain Patient left: in bed;in CPM;with call bell/phone within reach Nurse Communication: Mobility status PT Visit Diagnosis: Unsteadiness on feet (R26.81);Other abnormalities of gait and mobility (R26.89);Muscle weakness (generalized) (M62.81)     Time: 1610-9604 PT Time Calculation (min) (ACUTE ONLY): 38 min  Charges:  $Gait Training: 8-22 mins $Therapeutic Exercise: 8-22 mins                    G Codes:       1:51 PM, 02/26/18 Ocie Bob, MPT Physical Therapist with [facility] [phone] office [phone] mobile phone

## 2018-02-12 NOTE — Addendum Note (Signed)
Addendum  created 02/12/18 0826 by Earleen NewportAdams,  A, CRNA   Sign clinical note

## 2018-02-12 NOTE — Progress Notes (Signed)
Physical Therapy Treatment Patient Details Name: Anthony Sarva H Kuan MRN: [id number] DOB: 08/18/1946 Today's Date: 02/12/2018  LEFT KNEE ROM:0-95degrees AMBULATION DISTANCE:12520feet with Supervision using RW CPM: 0-90 DEGREES    History of Present Illness Anthony Sarva H Willow a 72 y/o female, s/p Left TKA 02/11/18  with the diagnosis of left knee osteoarthritis      PT Comments    Patient demonstrates increased endurance/distance for gait training in PM WITH fair/good return for left heel to toe stepping with less verbal cueing required, achieved increased left knee flexion while doing self stretching using RLE and tolerated sitting up in chair after therapy with LLE dangling.  Patient will benefit from continued physical therapy in hospital and recommended venue below to increase strength, balance, endurance for safe ADLs and gait.   Follow Up Recommendations  Home health PT;Supervision for mobility/OOB     Equipment Recommendations  None recommended by PT    Recommendations for Other Services       Precautions / Restrictions Precautions Precautions: Fall Restrictions Weight Bearing Restrictions: Yes LLE Weight Bearing: Weight bearing as tolerated Other Position/Activity Restrictions: no pillows underneath left knee    Mobility  Bed Mobility Overal bed mobility: Modified Independent Bed Mobility: Sit to Supine       Sit to supine: Supervision   General bed mobility comments: demonstrates good return for moving LLE   Transfers Overall transfer level: Needs assistance Equipment used: Rolling walker (2 wheeled) Transfers: Sit to/from [employer] CorporationStand;Stand Pivot Transfers Sit to Stand: Supervision Stand pivot transfers: Supervision       General transfer comment: verbal cues for proper hand placement during sit to stands  Ambulation/Gait Ambulation/Gait assistance: Supervision Gait Distance (Feet): 120 Feet Assistive device: Rolling walker (2 wheeled) Gait Pattern/deviations:  Decreased step length - left;Decreased stance time - left;Decreased stride length Gait velocity: decreased   General Gait Details: demonstrates increased endurance/distance with slow slightly labored cadence with fair/good return for left heel to toe stepping, limited secondary to fatigue    Stairs             Wheelchair Mobility    Modified Rankin (Stroke Patients Only)       Balance Overall balance assessment: Needs assistance Sitting-balance support: Feet supported;No upper extremity supported Sitting balance-Leahy Scale: Good     Standing balance support: Bilateral upper extremity supported;During functional activity Standing balance-Leahy Scale: Fair                              Cognition Arousal/Alertness: Awake/alert Behavior During Therapy: WFL for tasks assessed/performed Overall Cognitive Status: Within Functional Limits for tasks assessed                                        Exercises Total Joint Exercises Quad Sets: AROM;Strengthening;Left;10 reps;Seated Long Arc Quad: Seated;AROM;Strengthening;Left;10 reps Goniometric ROM: left knee: 0-92 degrees General Exercises - Lower Extremity Hip Flexion/Marching: Seated;AROM;Strengthening;Both;10 reps Toe Raises: Seated;AROM;Strengthening;Both;10 reps Heel Raises: Seated;AROM;Strengthening;Both;10 reps    General Comments        Pertinent Vitals/Pain Pain Assessment: 0-10 Pain Score: 3  Pain Location: left knee Pain Descriptors / Indicators: Aching;Sore;Discomfort Pain Intervention(s): Limited activity within patient's tolerance;Monitored during session    Home Living                      Prior Function  PT Goals (current goals can now be found in the care plan section) Acute Rehab PT Goals Patient Stated Goal: return home with family to assist PT Goal Formulation: With patient/family Time For Goal Achievement: 02/14/18 Potential to Achieve  Goals: Good Progress towards PT goals: Progressing toward goals    Frequency    7X/week      PT Plan Current plan remains appropriate    Co-evaluation              AM-PAC PT "6 Clicks" Daily Activity  Outcome Measure  Difficulty turning over in bed (including adjusting bedclothes, sheets and blankets)?: None Difficulty moving from lying on back to sitting on the side of the bed? : None Difficulty sitting down on and standing up from a chair with arms (e.g., wheelchair, bedside commode, etc,.)?: None Help needed moving to and from a bed to chair (including a wheelchair)?: A Little Help needed walking in hospital room?: A Little Help needed climbing 3-5 steps with a railing? : A Little 6 Click Score: 21    End of Session   Activity Tolerance: Patient tolerated treatment well;Patient limited by fatigue Patient left: in chair;with call bell/phone within reach;with family/visitor present Nurse Communication: Mobility status PT Visit Diagnosis: Unsteadiness on feet (R26.81);Other abnormalities of gait and mobility (R26.89);Muscle weakness (generalized) (M62.81)     Time: 1610-9604 PT Time Calculation (min) (ACUTE ONLY): 33 min  Charges:  $Gait Training: 8-22 mins $Therapeutic Exercise: 8-22 mins $Therapeutic Activity: 8-22 mins                    G Codes:       4:03 PM, 2018-03-08 Ocie Bob, MPT Physical Therapist with [facility] [phone] office [phone] mobile phone

## 2018-02-13 LAB — CBC
HEMATOCRIT: 25.5 % — AB (ref 36.0–46.0)
HEMOGLOBIN: 8.4 g/dL — AB (ref 12.0–15.0)
MCH: 26.6 pg (ref 26.0–34.0)
MCHC: 32.9 g/dL (ref 30.0–36.0)
MCV: 80.7 fL (ref 78.0–100.0)
Platelets: 247 10*3/uL (ref 150–400)
RBC: 3.16 MIL/uL — ABNORMAL LOW (ref 3.87–5.11)
RDW: 15.6 % — ABNORMAL HIGH (ref 11.5–15.5)
WBC: 8.5 10*3/uL (ref 4.0–10.5)

## 2018-02-13 MED ORDER — DOCUSATE SODIUM 100 MG PO CAPS: 100.0000 mg | ORAL_CAPSULE | Freq: Two times a day (BID) | ORAL | 0 refills | Status: DC

## 2018-02-13 MED ORDER — TRAMADOL HCL 50 MG PO TABS: 50.0000 mg | ORAL_TABLET | ORAL | 0 refills | Status: DC | PRN

## 2018-02-13 MED ORDER — ASPIRIN 325 MG PO TBEC: 325.0000 mg | DELAYED_RELEASE_TABLET | Freq: Every day | ORAL | 0 refills | Status: DC

## 2018-02-13 MED ORDER — GABAPENTIN 300 MG PO CAPS: 300.0000 mg | ORAL_CAPSULE | Freq: Two times a day (BID) | ORAL | 0 refills | Status: DC

## 2018-02-13 NOTE — Progress Notes (Signed)
IV removed, WNL. Dressing on L knee changed per Dr. Mort SawyersHarrison's orders. D/C instructions given to pt. Verbalized understanding. Pt daughter present to transport home.

## 2018-02-13 NOTE — Discharge Summary (Signed)
Physician Discharge Summary  Patient ID: Tiffany Bradley MRN: [id number] DOB/AGE: 71/05/1946 71 y.o.  Admit date: 02/11/2018 Discharge date: 02/13/2018  Admission Diagnoses: OSTEOARTHRITIS LEFT KNEE   Discharge Diagnoses: SAME   Discharged Condition: good  Procedure: LEFT TKA   Hospital Course:  HD # 1 June 13 TKA LEFT KNEE  HD # 2 June 14 WALKED WITH PT AND KNEE FLEXION 6185  HD # 3 June 15 PAIN CONTROLLED, WOUND CLEAN, CALF SOFT    CBC Latest Ref Rng & Units 02/12/2018 02/08/2018 04/28/2017  WBC 4.0 - 10.5 K/uL 7.9 4.1 4.4  Hemoglobin 12.0 - 15.0 g/dL 9.1(Y9.5(L) 10.6(L) 10.7(L)  Hematocrit 36.0 - 46.0 % 29.9(L) 33.5(L) 34.8  Platelets 150 - 400 K/uL 278 318 278   BMP Latest Ref Rng & Units 02/12/2018 02/08/2018 11/23/2017  Glucose 65 - 99 mg/dL 782(N132(H) 562(Z107(H) -  BUN 6 - 20 mg/dL 10 15 -  Creatinine 3.080.44 - 1.00 mg/dL 6.570.55 8.460.61 -  BUN/Creat Ratio 12 - 28 - - -  Sodium 135 - 145 mmol/L 137 143 -  Potassium 3.5 - 5.1 mmol/L 4.0 3.7 -  Chloride 101 - 111 mmol/L 102 107 -  CO2 22 - 32 mmol/L 28 27 -  Calcium 8.9 - 10.3 mg/dL 9.6(E8.7(L) 9.3 9.3     Discharge Exam: Blood pressure 124/72, pulse 81, temperature 98.4 F (36.9 C), temperature source Oral, resp. rate 16, SpO2 98 %.   Disposition: Discharge disposition: 01-Home or Self Care       Discharge Instructions    CPM   Complete by:  As directed    Continuous passive motion machine (CPM):      Use the CPM from 0 to 90 for 6 hours per day.      You may increase by 10 per day.  You may break it up into 2 or 3 sessions per day.      Use CPM for 2 weeks or until you are told to stop.   Call MD / Call 911   Complete by:  As directed    If you experience chest pain or shortness of breath, CALL 911 and be transported to the hospital emergency room.  If you develope a fever above 101 F, pus (white drainage) or increased drainage or redness at the wound, or calf pain, call your surgeon's office.   Change dressing   Complete by:  As  directed    The doctor will Change dressing.   Constipation Prevention   Complete by:  As directed    Drink plenty of fluids.  Prune juice may be helpful.  You may use a stool softener, such as Colace (over the counter) 100 mg twice a day.  Use MiraLax (over the counter) for constipation as needed.   Diet - low sodium heart healthy   Complete by:  As directed    Discharge instructions   Complete by:  As directed    Use bone foam 30 minutes 3 x a day   Do not put a pillow under the knee. Place it under the heel.   Complete by:  As directed    Driving restrictions   Complete by:  As directed    No driving for 2 weeks   Increase activity slowly as tolerated   Complete by:  As directed    TED hose   Complete by:  As directed    Use stockings (TED hose) for 2 weeks on both leg(s).  You may remove them  at night for sleeping.     Allergies as of 02/13/2018   No Known Allergies     Medication List    STOP taking these medications   diclofenac 75 MG EC tablet Commonly known as:  VOLTAREN     TAKE these medications   amLODipine 10 MG tablet Commonly known as:  NORVASC Take 1 tablet (10 mg total) by mouth daily.   aspirin 325 MG EC tablet Take 1 tablet (325 mg total) by mouth daily with breakfast.   CALCIUM 600+D3 PO Take 1 tablet by mouth daily.   docusate sodium 100 MG capsule Commonly known as:  COLACE Take 1 capsule (100 mg total) by mouth 2 (two) times daily.   gabapentin 300 MG capsule Commonly known as:  NEURONTIN Take 1 capsule (300 mg total) by mouth 2 (two) times daily.   GLUCOSAMINE-MSM PO Take 2 tablets by mouth daily.   MULTI-VITAMIN GUMMIES PO Take 2 each by mouth daily.   omeprazole 40 MG capsule Commonly known as:  PRILOSEC Take 1 capsule (40 mg total) by mouth daily.   sucralfate 1 g tablet Commonly known as:  CARAFATE Take 1 g by mouth 4 (four) times daily as needed (for upset stomach).   tiZANidine 4 MG tablet Commonly known as:   ZANAFLEX Take 1 tablet (4 mg total) by mouth at bedtime as needed for muscle spasms. What changed:  when to take this   traMADol 50 MG tablet Commonly known as:  ULTRAM Take 1 tablet (50 mg total) by mouth every 4 (four) hours as needed.            Discharge Care Instructions  (From admission, onward)        Start     Ordered   02/13/18 0000  Change dressing    Comments:  The doctor will Change dressing.   02/13/18 0715     Follow-up Information    [facility] Follow up.   Contact information: [address] [place] [address] [phone]        Vickki Hearing, MD. Schedule an appointment as soon as possible for a visit on 02/26/2018.   Specialties:  Orthopedic Surgery, Radiology Why:  staples out  Contact information: [address] [place] [address] [phone]           Signed: Fuller Canada 02/13/2018, 7:19 AM

## 2018-02-15 DIAGNOSIS — M1711 Unilateral primary osteoarthritis, right knee: Secondary | ICD-10-CM | POA: Diagnosis not present

## 2018-02-15 DIAGNOSIS — I10 Essential (primary) hypertension: Secondary | ICD-10-CM | POA: Diagnosis not present

## 2018-02-15 DIAGNOSIS — H269 Unspecified cataract: Secondary | ICD-10-CM | POA: Diagnosis not present

## 2018-02-15 DIAGNOSIS — K219 Gastro-esophageal reflux disease without esophagitis: Secondary | ICD-10-CM | POA: Diagnosis not present

## 2018-02-15 DIAGNOSIS — M4310 Spondylolisthesis, site unspecified: Secondary | ICD-10-CM | POA: Diagnosis not present

## 2018-02-15 DIAGNOSIS — H409 Unspecified glaucoma: Secondary | ICD-10-CM | POA: Diagnosis not present

## 2018-02-15 DIAGNOSIS — Z471 Aftercare following joint replacement surgery: Secondary | ICD-10-CM | POA: Diagnosis not present

## 2018-02-15 DIAGNOSIS — Z96652 Presence of left artificial knee joint: Secondary | ICD-10-CM | POA: Diagnosis not present

## 2018-02-15 DIAGNOSIS — Z87891 Personal history of nicotine dependence: Secondary | ICD-10-CM | POA: Diagnosis not present

## 2018-02-17 DIAGNOSIS — M1711 Unilateral primary osteoarthritis, right knee: Secondary | ICD-10-CM | POA: Diagnosis not present

## 2018-02-17 DIAGNOSIS — M4310 Spondylolisthesis, site unspecified: Secondary | ICD-10-CM | POA: Diagnosis not present

## 2018-02-17 DIAGNOSIS — I10 Essential (primary) hypertension: Secondary | ICD-10-CM | POA: Diagnosis not present

## 2018-02-17 DIAGNOSIS — H409 Unspecified glaucoma: Secondary | ICD-10-CM | POA: Diagnosis not present

## 2018-02-17 DIAGNOSIS — K219 Gastro-esophageal reflux disease without esophagitis: Secondary | ICD-10-CM | POA: Diagnosis not present

## 2018-02-17 DIAGNOSIS — Z96652 Presence of left artificial knee joint: Secondary | ICD-10-CM | POA: Diagnosis not present

## 2018-02-17 DIAGNOSIS — H269 Unspecified cataract: Secondary | ICD-10-CM | POA: Diagnosis not present

## 2018-02-17 DIAGNOSIS — Z87891 Personal history of nicotine dependence: Secondary | ICD-10-CM | POA: Diagnosis not present

## 2018-02-17 DIAGNOSIS — Z471 Aftercare following joint replacement surgery: Secondary | ICD-10-CM | POA: Diagnosis not present

## 2018-02-18 ENCOUNTER — Telehealth: Payer: Self-pay | Admitting: Orthopedic Surgery

## 2018-02-18 LAB — TYPE AND SCREEN
ABO/RH(D): O POS
Antibody Screen: NEGATIVE
UNIT DIVISION: 0
UNIT DIVISION: 0

## 2018-02-18 LAB — BPAM RBC
BLOOD PRODUCT EXPIRATION DATE: 201906252359
Blood Product Expiration Date: 201906252359
ISSUE DATE / TIME: 201906141300
Unit Type and Rh: 5100
Unit Type and Rh: 5100

## 2018-02-18 NOTE — Telephone Encounter (Signed)
She will be here on Monday at 11:30 per Dr Romeo AppleHarrison, I called her. Will you put this on the schedule?

## 2018-02-18 NOTE — Telephone Encounter (Signed)
Patient has a question or two about therapy.  Please call and advise

## 2018-02-18 NOTE — Telephone Encounter (Signed)
I spoke to patient she had a very difficult time with therapy yesterday. She states her leg is bruised. The therapist bent her knee and extended her knee as far as he could. I did advise her Dr Romeo AppleHarrison likes for the therapist to be aggressive with the therapy so she gets full motion. I told her she should not be bruised. I advised her when the therapist comes out again to let him know she was bruised and painful after the visit, she states she will. I have also told her to use ice (she states she is). She states she feels like she has had a set back and her motion decreased after this visit. I told her to keep pushing to get the motion, she voiced understanding.  Do you have any other advise I can offer?

## 2018-02-18 NOTE — Telephone Encounter (Signed)
Physical Therapy Treatment Patient Details Name: Tiffany Bradley H Faddis MRN: [id number] DOB: 03/26/1946 Today's Date: 02/12/2018   LEFT KNEE ROM:  0-95 degrees AMBULATION DISTANCE: 120 feet with Supervision using RW CPM: 0-90 DEGREES   This was her in house ROM and she had excellent pain control   Let me see her mon 1130

## 2018-02-19 DIAGNOSIS — K219 Gastro-esophageal reflux disease without esophagitis: Secondary | ICD-10-CM | POA: Diagnosis not present

## 2018-02-19 DIAGNOSIS — Z87891 Personal history of nicotine dependence: Secondary | ICD-10-CM | POA: Diagnosis not present

## 2018-02-19 DIAGNOSIS — I10 Essential (primary) hypertension: Secondary | ICD-10-CM | POA: Diagnosis not present

## 2018-02-19 DIAGNOSIS — M1711 Unilateral primary osteoarthritis, right knee: Secondary | ICD-10-CM | POA: Diagnosis not present

## 2018-02-19 DIAGNOSIS — Z96652 Presence of left artificial knee joint: Secondary | ICD-10-CM | POA: Diagnosis not present

## 2018-02-19 DIAGNOSIS — M4310 Spondylolisthesis, site unspecified: Secondary | ICD-10-CM | POA: Diagnosis not present

## 2018-02-19 DIAGNOSIS — H269 Unspecified cataract: Secondary | ICD-10-CM | POA: Diagnosis not present

## 2018-02-19 DIAGNOSIS — Z471 Aftercare following joint replacement surgery: Secondary | ICD-10-CM | POA: Diagnosis not present

## 2018-02-19 DIAGNOSIS — H409 Unspecified glaucoma: Secondary | ICD-10-CM | POA: Diagnosis not present

## 2018-02-22 ENCOUNTER — Encounter: Payer: Self-pay | Admitting: Orthopedic Surgery

## 2018-02-22 ENCOUNTER — Ambulatory Visit (INDEPENDENT_AMBULATORY_CARE_PROVIDER_SITE_OTHER): Payer: Medicare HMO | Admitting: Orthopedic Surgery

## 2018-02-22 VITALS — BP 119/73 | HR 106 | Ht 63.0 in | Wt 174.0 lb

## 2018-02-22 DIAGNOSIS — Z96652 Presence of left artificial knee joint: Secondary | ICD-10-CM

## 2018-02-22 MED ORDER — GABAPENTIN 300 MG PO CAPS: 300.0000 mg | ORAL_CAPSULE | Freq: Two times a day (BID) | ORAL | 0 refills | Status: DC

## 2018-02-22 MED ORDER — TRAMADOL HCL 50 MG PO TABS: 50.0000 mg | ORAL_TABLET | ORAL | 0 refills | Status: DC | PRN

## 2018-02-22 NOTE — Progress Notes (Signed)
Unscheduled visit  Status post left total knee on June 13 scheduled for office visit June 28 for staple removal called in the last week complaining of increased pain after 1 of her therapy visits  She said that they were too aggressive and she got up the next morning and could not move the knee as well  She exhibits full extension 70 degrees of flexion her CPM machine is 70 to 85 degrees  She does have increased swelling compared to the last time I saw her in the hospital other than that I do not see any problems her suture line looks good she has no calf tenderness to her Homans sign is negative  Recommend increased ice continue therapy follow-up at the end of the week to have the staples taken out  Meds ordered this encounter  Medications  . traMADol (ULTRAM) 50 MG tablet    Sig: Take 1 tablet (50 mg total) by mouth every 4 (four) hours as needed.    Dispense:  42 tablet    Refill:  0  . gabapentin (NEURONTIN) 300 MG capsule    Sig: Take 1 capsule (300 mg total) by mouth 2 (two) times daily.    Dispense:  60 capsule    Refill:  0

## 2018-02-22 NOTE — Patient Instructions (Signed)
Continue icing daily as much as possible use her CPM machine as tolerated

## 2018-02-24 DIAGNOSIS — M1711 Unilateral primary osteoarthritis, right knee: Secondary | ICD-10-CM | POA: Diagnosis not present

## 2018-02-24 DIAGNOSIS — Z87891 Personal history of nicotine dependence: Secondary | ICD-10-CM | POA: Diagnosis not present

## 2018-02-24 DIAGNOSIS — Z96652 Presence of left artificial knee joint: Secondary | ICD-10-CM | POA: Diagnosis not present

## 2018-02-24 DIAGNOSIS — H269 Unspecified cataract: Secondary | ICD-10-CM | POA: Diagnosis not present

## 2018-02-24 DIAGNOSIS — I10 Essential (primary) hypertension: Secondary | ICD-10-CM | POA: Diagnosis not present

## 2018-02-24 DIAGNOSIS — M4310 Spondylolisthesis, site unspecified: Secondary | ICD-10-CM | POA: Diagnosis not present

## 2018-02-24 DIAGNOSIS — Z471 Aftercare following joint replacement surgery: Secondary | ICD-10-CM | POA: Diagnosis not present

## 2018-02-24 DIAGNOSIS — K219 Gastro-esophageal reflux disease without esophagitis: Secondary | ICD-10-CM | POA: Diagnosis not present

## 2018-02-24 DIAGNOSIS — H409 Unspecified glaucoma: Secondary | ICD-10-CM | POA: Diagnosis not present

## 2018-02-25 DIAGNOSIS — Z471 Aftercare following joint replacement surgery: Secondary | ICD-10-CM | POA: Diagnosis not present

## 2018-02-25 DIAGNOSIS — H269 Unspecified cataract: Secondary | ICD-10-CM | POA: Diagnosis not present

## 2018-02-25 DIAGNOSIS — Z87891 Personal history of nicotine dependence: Secondary | ICD-10-CM | POA: Diagnosis not present

## 2018-02-25 DIAGNOSIS — M4310 Spondylolisthesis, site unspecified: Secondary | ICD-10-CM | POA: Diagnosis not present

## 2018-02-25 DIAGNOSIS — Z96652 Presence of left artificial knee joint: Secondary | ICD-10-CM | POA: Diagnosis not present

## 2018-02-25 DIAGNOSIS — I10 Essential (primary) hypertension: Secondary | ICD-10-CM | POA: Diagnosis not present

## 2018-02-25 DIAGNOSIS — K219 Gastro-esophageal reflux disease without esophagitis: Secondary | ICD-10-CM | POA: Diagnosis not present

## 2018-02-25 DIAGNOSIS — M1711 Unilateral primary osteoarthritis, right knee: Secondary | ICD-10-CM | POA: Diagnosis not present

## 2018-02-25 DIAGNOSIS — H409 Unspecified glaucoma: Secondary | ICD-10-CM | POA: Diagnosis not present

## 2018-02-26 ENCOUNTER — Ambulatory Visit (INDEPENDENT_AMBULATORY_CARE_PROVIDER_SITE_OTHER): Payer: Medicare HMO | Admitting: Orthopedic Surgery

## 2018-02-26 ENCOUNTER — Encounter: Payer: Self-pay | Admitting: Orthopedic Surgery

## 2018-02-26 VITALS — BP 127/75 | HR 95 | Ht 63.0 in | Wt 170.0 lb

## 2018-02-26 DIAGNOSIS — Z96652 Presence of left artificial knee joint: Secondary | ICD-10-CM

## 2018-02-26 NOTE — Addendum Note (Signed)
Addended byCaffie Damme: Tiffany Bradley on: 02/26/2018 10:30 AM   Modules accepted: Orders

## 2018-02-26 NOTE — Progress Notes (Signed)
POSTOP VISIT  POD # 15  Chief Complaint  Patient presents with  . Post-op Follow-up    02/11/18 left total knee     72 year old female status post left total knee here for staple removal doing well with extension flexion 80 degrees swelling is going down knee is improving pain is controlled she is ambulating with a cane   Encounter Diagnosis  Name Primary?  . Status post total knee replacement, 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 Yes    Recommend follow-up 3 weeks   (Work, WB, No orders of the defined types were placed in this encounter. ,FU)

## 2018-03-02 DIAGNOSIS — M4310 Spondylolisthesis, site unspecified: Secondary | ICD-10-CM | POA: Diagnosis not present

## 2018-03-02 DIAGNOSIS — I10 Essential (primary) hypertension: Secondary | ICD-10-CM | POA: Diagnosis not present

## 2018-03-02 DIAGNOSIS — M1711 Unilateral primary osteoarthritis, right knee: Secondary | ICD-10-CM | POA: Diagnosis not present

## 2018-03-02 DIAGNOSIS — H269 Unspecified cataract: Secondary | ICD-10-CM | POA: Diagnosis not present

## 2018-03-02 DIAGNOSIS — K219 Gastro-esophageal reflux disease without esophagitis: Secondary | ICD-10-CM | POA: Diagnosis not present

## 2018-03-02 DIAGNOSIS — Z96652 Presence of left artificial knee joint: Secondary | ICD-10-CM | POA: Diagnosis not present

## 2018-03-02 DIAGNOSIS — Z471 Aftercare following joint replacement surgery: Secondary | ICD-10-CM | POA: Diagnosis not present

## 2018-03-02 DIAGNOSIS — H409 Unspecified glaucoma: Secondary | ICD-10-CM | POA: Diagnosis not present

## 2018-03-02 DIAGNOSIS — Z87891 Personal history of nicotine dependence: Secondary | ICD-10-CM | POA: Diagnosis not present

## 2018-03-03 ENCOUNTER — Other Ambulatory Visit: Payer: Self-pay | Admitting: Orthopedic Surgery

## 2018-03-03 DIAGNOSIS — Z96652 Presence of left artificial knee joint: Secondary | ICD-10-CM

## 2018-03-03 MED ORDER — TRAMADOL HCL 50 MG PO TABS: 50.0000 mg | ORAL_TABLET | ORAL | 0 refills | Status: DC | PRN

## 2018-03-03 NOTE — Telephone Encounter (Signed)
Patient called to relay she is out of medication - requests refill: traMADol (ULTRAM) 50 MG tablet 42 tablet   -[facility], [place]

## 2018-03-03 NOTE — Telephone Encounter (Signed)
Done

## 2018-03-05 DIAGNOSIS — M1711 Unilateral primary osteoarthritis, right knee: Secondary | ICD-10-CM | POA: Diagnosis not present

## 2018-03-05 DIAGNOSIS — H409 Unspecified glaucoma: Secondary | ICD-10-CM | POA: Diagnosis not present

## 2018-03-05 DIAGNOSIS — M4310 Spondylolisthesis, site unspecified: Secondary | ICD-10-CM | POA: Diagnosis not present

## 2018-03-05 DIAGNOSIS — I10 Essential (primary) hypertension: Secondary | ICD-10-CM | POA: Diagnosis not present

## 2018-03-05 DIAGNOSIS — H269 Unspecified cataract: Secondary | ICD-10-CM | POA: Diagnosis not present

## 2018-03-05 DIAGNOSIS — Z87891 Personal history of nicotine dependence: Secondary | ICD-10-CM | POA: Diagnosis not present

## 2018-03-05 DIAGNOSIS — K219 Gastro-esophageal reflux disease without esophagitis: Secondary | ICD-10-CM | POA: Diagnosis not present

## 2018-03-05 DIAGNOSIS — Z471 Aftercare following joint replacement surgery: Secondary | ICD-10-CM | POA: Diagnosis not present

## 2018-03-05 DIAGNOSIS — Z96652 Presence of left artificial knee joint: Secondary | ICD-10-CM | POA: Diagnosis not present

## 2018-03-08 DIAGNOSIS — K219 Gastro-esophageal reflux disease without esophagitis: Secondary | ICD-10-CM | POA: Diagnosis not present

## 2018-03-08 DIAGNOSIS — H269 Unspecified cataract: Secondary | ICD-10-CM | POA: Diagnosis not present

## 2018-03-08 DIAGNOSIS — Z96652 Presence of left artificial knee joint: Secondary | ICD-10-CM | POA: Diagnosis not present

## 2018-03-08 DIAGNOSIS — I10 Essential (primary) hypertension: Secondary | ICD-10-CM | POA: Diagnosis not present

## 2018-03-08 DIAGNOSIS — M4310 Spondylolisthesis, site unspecified: Secondary | ICD-10-CM | POA: Diagnosis not present

## 2018-03-08 DIAGNOSIS — Z87891 Personal history of nicotine dependence: Secondary | ICD-10-CM | POA: Diagnosis not present

## 2018-03-08 DIAGNOSIS — H409 Unspecified glaucoma: Secondary | ICD-10-CM | POA: Diagnosis not present

## 2018-03-08 DIAGNOSIS — M1711 Unilateral primary osteoarthritis, right knee: Secondary | ICD-10-CM | POA: Diagnosis not present

## 2018-03-08 DIAGNOSIS — Z471 Aftercare following joint replacement surgery: Secondary | ICD-10-CM | POA: Diagnosis not present

## 2018-03-09 ENCOUNTER — Encounter: Payer: Self-pay | Admitting: Physical Therapy

## 2018-03-09 ENCOUNTER — Ambulatory Visit: Payer: Medicare HMO | Attending: Orthopedic Surgery | Admitting: Physical Therapy

## 2018-03-09 DIAGNOSIS — R600 Localized edema: Secondary | ICD-10-CM

## 2018-03-09 DIAGNOSIS — M25662 Stiffness of left knee, not elsewhere classified: Secondary | ICD-10-CM

## 2018-03-09 DIAGNOSIS — M6281 Muscle weakness (generalized): Secondary | ICD-10-CM | POA: Diagnosis not present

## 2018-03-09 NOTE — Therapy (Signed)
[facility] [address] [place], [place], [address] Phone: [phone]   Fax:  [phone]  Physical Therapy Evaluation  Patient Details  Name: Tiffany Bradley MRN: [id number] Date of Birth: September 09, 1945 Referring Provider: Fuller Canada MD.   Encounter Date: 03/09/2018  PT End of Session - 03/09/18 1326    Visit Number  1    Number of Visits  16    Date for PT Re-Evaluation  06/06/18    Authorization Type  10TH VISIT PROGRESS NOTE AND KX MODIFIER AFTER THE 15 VISIT.    MD 01-27-18    PT Start Time  0100    PT Stop Time  0146    PT Time Calculation (min)  46 min       Past Medical History:  Diagnosis Date  . Arthritis   . Back pain   . Cataract   . DJD (degenerative joint disease)   . Full dentures   . GERD (gastroesophageal reflux disease)   . Glaucoma   . History of bleeding ulcers    patient reports multiple hospitalizations remotely  . Hypertension   . Wears glasses     Past Surgical History:  Procedure Laterality Date  . BREAST REDUCTION SURGERY Bilateral 04/25/2013   Procedure: MAMMARY REDUCTION  (BREAST);  Surgeon: Louisa Second, MD;  Location:  SURGERY CENTER;  Service: Plastics;  Laterality: Bilateral;  . COLONOSCOPY     2008, normal per patient  . COLONOSCOPY N/A 01/16/2017   Procedure: COLONOSCOPY;  Surgeon: West Bali, MD;  [facility];  Service: Endoscopy;  Laterality: N/A;  830   . COSMETIC SURGERY    . ESOPHAGOGASTRODUODENOSCOPY N/A 01/16/2017   Procedure: ESOPHAGOGASTRODUODENOSCOPY (EGD);  Surgeon: West Bali, MD;  [facility];  Service: Endoscopy;  Laterality: N/A;  . PARTIAL COLECTOMY N/A 06/29/2016   Procedure: PARTIAL COLECTOMY;  Surgeon: Ancil Linsey, MD;  Location: [facility];  Service: General;  Laterality: N/A;  . 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   Procedure: TOTAL KNEE ARTHROPLASTY;  Surgeon: Vickki Hearing, MD;  Location: [facility];  Service:  Orthopedics;  Laterality: Left;  DePuy   . TUBAL LIGATION      There were no vitals filed for this visit.   Subjective Assessment - 03/09/18 1328    Subjective  The patient underwent a left total knee replacement on 02/11/18.  She is pleased with her progress thus far but she states it feels stiff a lot.  She used a CPM which was recently returned.  She reports no pain at rest today and increased pain with movement.    Pertinent History  OP; DJD;     Patient Stated Goals  Get to walking better without pain.    Currently in Pain?  No/denies         [facility] PT Assessment - 03/09/18 0001      Assessment   Medical Diagnosis  S/p left total knee replacement.    Referring Provider  Fuller Canada MD.    Onset Date/Surgical Date  -- 02/11/18 (surgery date).      Precautions   Precautions  -- No ultrasound.      Restrictions   Weight Bearing Restrictions  No      Balance Screen   Has the patient fallen in the past 6 months  No    Has the patient had a decrease in activity level because of a fear of falling?   Yes  Is the patient reluctant to leave their home because of a fear of falling?   No      Home Environment   Living Environment  Private residence      Prior Function   Level of Independence  Independent      Observation/Other Assessments   Focus on Therapeutic Outcomes (FOTO)   67% limitation.      Observation/Other Assessments-Edema    Edema  Circumferential      Circumferential Edema   Circumferential - Left   LT 5.5 CMS > RT.      ROM / Strength   AROM / PROM / Strength  AROM;Strength      AROM   Overall AROM Comments  Left knee active extension is full with active flexion to 70 degrees and passive= 75 degrees.      Strength   Overall Strength Comments  Left hip flexion= 4/5 and left knee= 4+/5.      Palpation   Palpation comment  Mild palpable tenderness around left anterior knee region.      Ambulation/Gait   Gait Comments  Generally good gait pattern  with decreased step length and patient using a straight cane.                Objective measurements completed on examination: See above findings.      [facility] - 03/09/18 0001      Exercises   Exercises  Knee/Hip      Lumbar Exercises: Aerobic   Nustep  Level 3 x 8 minutes.      Modalities   Modalities  Vasopneumatic      Vasopneumatic   Number Minutes Vasopneumatic   10 minutes    Vasopnuematic Location   -- Left knee.    Vasopneumatic Pressure  Medium               PT Short Term Goals - 03/09/18 1426      PT SHORT TERM GOAL #1   Title  STG's= LTG's.        PT Long Term Goals - 03/09/18 1427      PT LONG TERM GOAL #1   Title  Independent with a HEP.    Time  8    Period  Weeks    Status  New      PT LONG TERM GOAL #2   Title  Active left knee flexion to 115 degrees+ so the patient can perform functional tasks and do so with pain not > 2-3/10.    Time  8    Period  Weeks    Status  New      PT LONG TERM GOAL #3   Title  Perform ADL's with pain not > 3/10.    Time  8    Period  Weeks    Status  New      PT LONG TERM GOAL #4   Title  Increase left knee strength to a solid 4+/5 to provide good stability for accomplishment of functional activities.    Time  8    Period  Weeks    Status  New      PT LONG TERM GOAL #5   Title  Perform a reciprocating stair gait with one railing with pain not > 2-3/10.    Time  8    Period  Weeks    Status  New             Plan - 03/09/18  1340    Clinical Impression Statement  The patient presents to OPPT s/p left total knee replacement performed on 02/11/18.  She has increased edema and limited knee flexion currently.  Her extension is full.  She is compliant to a HEP and is walking safely with a straight cane.  Patient current deficits impair her functional mobility.  Patient will benefit from skilled physical therapy intervention to address pain and deficits.       Clinical Presentation  Stable    Clinical Decision Making  Low    Rehab Potential  Excellent    PT Frequency  2x / week    PT Duration  8 weeks    PT Treatment/Interventions  ADLs/Self Care Home Management;Electrical Stimulation;Cryotherapy;Gait training;Stair training;Functional mobility training;Therapeutic activities;Therapeutic exercise;Patient/family education;Neuromuscular re-education;Manual techniques;Passive range of motion;Vasopneumatic Device    PT Next Visit Plan  Total knee protocol.  Vaso and e'stim.    Consulted and Agree with Plan of Care  Patient       Patient will benefit from skilled therapeutic intervention in order to improve the following deficits and impairments:  Decreased activity tolerance, Increased edema, Decreased range of motion, Decreased strength  Visit Diagnosis: Stiffness of left knee, not elsewhere classified - Plan: PT plan of care cert/re-cert  Localized edema - Plan: PT plan of care cert/re-cert  Muscle weakness (generalized) - Plan: PT plan of care cert/re-cert     Problem List Patient Active Problem List   Diagnosis Date Noted  . Status post total knee replacement, 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 02/11/2018  . Iron deficiency anemia 04/28/2017  . Vitamin D deficiency 04/28/2017  . Osteopenia 04/28/2017  . Gastritis, erosive   . Abdominal pain, epigastric 12/12/2016  . Colon cancer screening 11/03/2016  . Essential hypertension 11/03/2016  . Primary osteoarthritis of both knees 11/03/2016  . GERD (gastroesophageal reflux disease) 11/03/2016  . Age-related osteoporosis without current pathological fracture 11/03/2016  . Perforation of sigmoid colon (HCC) 06/29/2016    Michie Molnar, [place] MPT 03/09/2018, 3:09 PM  [facility] [address] [place], [place], [address] Phone: [phone]   Fax:  [phone]  Name: Tiffany Bradley MRN: [id number] Date of Birth: [birth year]

## 2018-03-11 ENCOUNTER — Ambulatory Visit: Payer: Medicare HMO | Admitting: Physical Therapy

## 2018-03-11 DIAGNOSIS — M25662 Stiffness of left knee, not elsewhere classified: Secondary | ICD-10-CM

## 2018-03-11 DIAGNOSIS — R600 Localized edema: Secondary | ICD-10-CM

## 2018-03-11 DIAGNOSIS — M6281 Muscle weakness (generalized): Secondary | ICD-10-CM | POA: Diagnosis not present

## 2018-03-11 NOTE — Therapy (Signed)
[facility] [address] [place], [place], [address] Phone: [phone]   Fax:  [phone]  Physical Therapy Treatment  Patient Details  Name: Tiffany Bradley MRN: [id number] Date of Birth: [birth year] Referring Provider: Fuller Canada MD.   Encounter Date: 03/11/2018  PT End of Session - 03/11/18 1345    Visit Number  2    Number of Visits  16    Date for PT Re-Evaluation  06/06/18    Authorization Type  10TH VISIT PROGRESS NOTE AND KX MODIFIER AFTER THE 15 VISIT.        PT Start Time  1302    PT Stop Time  1355    PT Time Calculation (min)  53 min    Activity Tolerance  Patient tolerated treatment well    Behavior During Therapy  WFL for tasks assessed/performed       Past Medical History:  Diagnosis Date  . Arthritis   . Back pain   . Cataract   . DJD (degenerative joint disease)   . Full dentures   . GERD (gastroesophageal reflux disease)   . Glaucoma   . History of bleeding ulcers    patient reports multiple hospitalizations remotely  . Hypertension   . Wears glasses     Past Surgical History:  Procedure Laterality Date  . BREAST REDUCTION SURGERY Bilateral 04/25/2013   Procedure: MAMMARY REDUCTION  (BREAST);  Surgeon: Louisa Second, MD;  Location: [facility];  Service: Plastics;  Laterality: Bilateral;  . COLONOSCOPY     2008, normal per patient  . COLONOSCOPY N/A 01/16/2017   Procedure: COLONOSCOPY;  Surgeon: West Bali, MD;  [facility];  Service: Endoscopy;  Laterality: N/A;  830   . COSMETIC SURGERY    . ESOPHAGOGASTRODUODENOSCOPY N/A 01/16/2017   Procedure: ESOPHAGOGASTRODUODENOSCOPY (EGD);  Surgeon: West Bali, MD;  [facility];  Service: Endoscopy;  Laterality: N/A;  . PARTIAL COLECTOMY N/A 06/29/2016   Procedure: PARTIAL COLECTOMY;  Surgeon: Ancil Linsey, MD;  Location: [facility];  Service: General;  Laterality: N/A;  . TOTAL KNEE ARTHROPLASTY Left  02/11/2018   Procedure: TOTAL KNEE ARTHROPLASTY;  Surgeon: Vickki Hearing, MD;  Location: [facility];  Service: Orthopedics;  Laterality: Left;  DePuy   . TUBAL LIGATION      There were no vitals filed for this visit.  Subjective Assessment - 03/11/18 1344    Subjective  Reports that she has not been able to rest well since she does not have the CPM machine. Reports that she has been laying around today and is stiff upon arrival.    Pertinent History  OP; DJD;     Limitations  Standing    How long can you stand comfortably?  10 minutes.    How long can you walk comfortably?  Short community distances.    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    Patient Stated Goals  Get to walking better without pain.    Currently in Pain?  Yes    Pain Score  5     Pain Location  Knee    Pain Orientation  Left    Pain Descriptors / Indicators  Discomfort    Pain Type  Surgical pain    Pain Onset  More than a month ago    Pain Frequency  Intermittent    Aggravating Factors   Knee flexion    Pain Relieving Factors  Rest         [facility] PT Assessment - 03/11/18 0001      Assessment   Medical Diagnosis  S/p left total knee replacement.    Onset Date/Surgical Date  02/11/18    Next MD Visit  03/19/2018      Restrictions   Weight Bearing Restrictions  No      ROM / Strength   AROM / PROM / Strength  AROM      AROM   Overall AROM   Deficits    AROM Assessment Site  Knee    Right/Left Knee  Left    Left Knee Extension  1    Left Knee Flexion  86                   [facility] Adult PT Treatment/Exercise - 03/11/18 0001      Knee/Hip Exercises: Aerobic   Nustep  L4, seat 9 x18 min      Knee/Hip Exercises: Standing   Hip Flexion  AROM;Left;15 reps;Knee bent    Forward Lunges  Left;2 sets;10 reps;2 seconds    Hip Abduction  AROM;Left;15 reps;Knee straight      Knee/Hip Exercises: Supine   Short Arc Quad Sets  AROM;Left;2 sets;10 reps       Modalities   Modalities  Programmer, applications Location  L knee    Electrical Stimulation Action  IFC    Electrical Stimulation Parameters  80-150 hz x15 min    Electrical Stimulation Goals  Pain;Edema      Vasopneumatic   Number Minutes Vasopneumatic   15 minutes    Vasopnuematic Location   Knee    Vasopneumatic Pressure  Low    Vasopneumatic Temperature   63      Manual Therapy   Manual Therapy  Passive ROM    Passive ROM  PROM of L knee into flexion, extension with holds at end range               PT Short Term Goals - 03/09/18 1426      PT SHORT TERM GOAL #1   Title  STG's= LTG's.        PT Long Term Goals - 03/09/18 1427      PT LONG TERM GOAL #1   Title  Independent with a HEP.    Time  8    Period  Weeks    Status  New      PT LONG TERM GOAL #2   Title  Active left knee flexion to 115 degrees+ so the patient can perform functional tasks and do so with pain not > 2-3/10.    Time  8    Period  Weeks    Status  New      PT LONG TERM GOAL #3   Title  Perform ADL's with pain not > 3/10.    Time  8    Period  Weeks    Status  New      PT LONG TERM GOAL #4   Title  Increase left knee strength to a solid 4+/5 to provide good stability for accomplishment of functional activities.    Time  8    Period  Weeks    Status  New      PT LONG TERM GOAL #5   Title  Perform a reciprocating stair gait with one railing with pain not > 2-3/10.  Time  8    Period  Weeks    Status  New            Plan - 03/11/18 1429    Clinical Impression Statement  Patient tolerated today's treatment well although she arrived with increased pain and stiffness. Patient able to complete exercises as directed without complaint of increased pain. Decreased R knee flexion noted throughout treatment with gait pattern as well as during exercises such as lunges. Deficient L quad activation with minimal quad  activity observed. AROM L knee 1-86 deg in supine. Normal modalities response noted following removal of the modalities. Patient educated regarding activity and rest balance with ambulation utilized to assist with pain and reduction of stiffness. Patient also educated in completing elevation and icing for 10-20 minutes following prolonged acitivity.    Rehab Potential  Excellent    PT Frequency  2x / week    PT Duration  8 weeks    PT Treatment/Interventions  ADLs/Self Care Home Management;Electrical Stimulation;Cryotherapy;Gait training;Stair training;Functional mobility training;Therapeutic activities;Therapeutic exercise;Patient/family education;Neuromuscular re-education;Manual techniques;Passive range of motion;Vasopneumatic Device    PT Next Visit Plan  Continue with TKR protocol with focus on knee flexion and quad activition.    Consulted and Agree with Plan of Care  Patient       Patient will benefit from skilled therapeutic intervention in order to improve the following deficits and impairments:  Decreased activity tolerance, Increased edema, Decreased range of motion, Decreased strength  Visit Diagnosis: Stiffness of left knee, not elsewhere classified  Localized edema  Muscle weakness (generalized)     Problem List Patient Active Problem List   Diagnosis Date Noted  . Status post total knee replacement, 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 02/11/2018  . Iron deficiency anemia 04/28/2017  . Vitamin D deficiency 04/28/2017  . Osteopenia 04/28/2017  . Gastritis, erosive   . Abdominal pain, epigastric 12/12/2016  . Colon cancer screening 11/03/2016  . Essential hypertension 11/03/2016  . Primary osteoarthritis of both knees 11/03/2016  . GERD (gastroesophageal reflux disease) 11/03/2016  . Age-related osteoporosis without current pathological fracture 11/03/2016  . Perforation of sigmoid colon (HCC) 06/29/2016    Marvell FullerKelsey P Payson Evrard, PTA 03/11/2018, 2:48 PM  [facility] [address] [place], [place], [address] Phone: [phone]   Fax:  [phone]  Name: Tiffany Bradley MRN: [id number] Date of Birth: 08/14/1946

## 2018-03-12 DIAGNOSIS — K219 Gastro-esophageal reflux disease without esophagitis: Secondary | ICD-10-CM | POA: Diagnosis not present

## 2018-03-12 DIAGNOSIS — H269 Unspecified cataract: Secondary | ICD-10-CM | POA: Diagnosis not present

## 2018-03-12 DIAGNOSIS — Z471 Aftercare following joint replacement surgery: Secondary | ICD-10-CM | POA: Diagnosis not present

## 2018-03-12 DIAGNOSIS — M4310 Spondylolisthesis, site unspecified: Secondary | ICD-10-CM | POA: Diagnosis not present

## 2018-03-12 DIAGNOSIS — I10 Essential (primary) hypertension: Secondary | ICD-10-CM | POA: Diagnosis not present

## 2018-03-12 DIAGNOSIS — H409 Unspecified glaucoma: Secondary | ICD-10-CM | POA: Diagnosis not present

## 2018-03-12 DIAGNOSIS — Z87891 Personal history of nicotine dependence: Secondary | ICD-10-CM | POA: Diagnosis not present

## 2018-03-12 DIAGNOSIS — Z96652 Presence of left artificial knee joint: Secondary | ICD-10-CM | POA: Diagnosis not present

## 2018-03-12 DIAGNOSIS — M1711 Unilateral primary osteoarthritis, right knee: Secondary | ICD-10-CM | POA: Diagnosis not present

## 2018-03-12 NOTE — Telephone Encounter (Signed)
Pt had recent knee replacement Contraindicated for Prolia at this time

## 2018-03-15 ENCOUNTER — Encounter: Payer: Medicare HMO | Admitting: Physical Therapy

## 2018-03-16 ENCOUNTER — Encounter: Payer: Self-pay | Admitting: Physical Therapy

## 2018-03-16 ENCOUNTER — Ambulatory Visit: Payer: Medicare HMO | Admitting: Physical Therapy

## 2018-03-16 DIAGNOSIS — R600 Localized edema: Secondary | ICD-10-CM | POA: Diagnosis not present

## 2018-03-16 DIAGNOSIS — M25662 Stiffness of left knee, not elsewhere classified: Secondary | ICD-10-CM

## 2018-03-16 DIAGNOSIS — M6281 Muscle weakness (generalized): Secondary | ICD-10-CM

## 2018-03-16 NOTE — Therapy (Signed)
[facility] [address] [place], [place], [address] Phone: [phone]   Fax:  [phone]  Physical Therapy Treatment  Patient Details  Name: Tiffany Bradley MRN: [id number] Date of Birth: 07/01/1946 Referring Provider: Fuller CanadaStanley Harrison MD.   Encounter Date: 03/16/2018  PT End of Session - 03/16/18 1308    Visit Number  3    Number of Visits  16    Date for PT Re-Evaluation  06/06/18    Authorization Type  10TH VISIT PROGRESS NOTE AND KX MODIFIER AFTER THE 15 VISIT.        PT Start Time  1303    PT Stop Time  1407    PT Time Calculation (min)  64 min    Activity Tolerance  Patient tolerated treatment well    Behavior During Therapy  WFL for tasks assessed/performed       Past Medical History:  Diagnosis Date  . Arthritis   . Back pain   . Cataract   . DJD (degenerative joint disease)   . Full dentures   . GERD (gastroesophageal reflux disease)   . Glaucoma   . History of bleeding ulcers    patient reports multiple hospitalizations remotely  . Hypertension   . Wears glasses     Past Surgical History:  Procedure Laterality Date  . BREAST REDUCTION SURGERY Bilateral 04/25/2013   Procedure: MAMMARY REDUCTION  (BREAST);  Surgeon: Louisa SecondGerald Truesdale, MD;  Location: [facility];  Service: Plastics;  Laterality: Bilateral;  . COLONOSCOPY     2008, normal per patient  . COLONOSCOPY N/A 01/16/2017   Procedure: COLONOSCOPY;  Surgeon: West BaliFields, Sandi L, MD;  [facility];  Service: Endoscopy;  Laterality: N/A;  830   . COSMETIC SURGERY    . ESOPHAGOGASTRODUODENOSCOPY N/A 01/16/2017   Procedure: ESOPHAGOGASTRODUODENOSCOPY (EGD);  Surgeon: West BaliFields, Sandi L, MD;  [facility];  Service: Endoscopy;  Laterality: N/A;  . PARTIAL COLECTOMY N/A 06/29/2016   Procedure: PARTIAL COLECTOMY;  Surgeon: Ancil LinseyJason Evan Davis, MD;  Location: [facility];  Service: General;  Laterality: N/A;  . TOTAL KNEE ARTHROPLASTY Left  02/11/2018   Procedure: TOTAL KNEE ARTHROPLASTY;  Surgeon: Vickki HearingHarrison, Stanley E, MD;  Location: [facility];  Service: Orthopedics;  Laterality: Left;  DePuy   . TUBAL LIGATION      There were no vitals filed for this visit.  Subjective Assessment - 03/16/18 1307    Subjective  Reports L knee tightness and increased swelling today.    Pertinent History  OP; DJD;     Limitations  Standing    How long can you stand comfortably?  10 minutes.    How long can you walk comfortably?  Short community distances.    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    Patient Stated Goals  Get to walking better without pain.    Currently in Pain?  Yes    Pain Score  -- No pain score provided by patient    Pain Location  Knee    Pain Orientation  Left    Pain Descriptors / Indicators  Tightness    Pain Type  Surgical pain    Pain Onset  More than a month ago         [facility] - 03/16/18 0001      Assessment   Medical Diagnosis  S/p left total knee replacement.    Onset Date/Surgical Date  02/11/18  Next MD Visit  03/19/2018      Restrictions   Weight Bearing Restrictions  No      Observation/Other Assessments-Edema    Edema  Circumferential      Circumferential Edema   Circumferential - Right  R knee 40.2 cm, R superior tibia 33.2 cm, R ankle 20.9 cm    Circumferential - Left   L knee 44 cm, L superior tibia 34.5 cm, L ankle 32.2 cm      ROM / Strength   AROM / PROM / Strength  AROM      AROM   Overall AROM   Within functional limits for tasks performed;Deficits    AROM Assessment Site  Knee    Right/Left Knee  Left    Left Knee Extension  0    Left Knee Flexion  84                   [facility] Adult PT Treatment/Exercise - 03/16/18 0001      Knee/Hip Exercises: Aerobic   Nustep  L4, seat 11-9 x19 min      Knee/Hip Exercises: Standing   Forward Lunges  Left;2 sets;10 reps;2 seconds    Forward Step Up  Left;2 sets;10 reps;Hand Hold:  2;Step Height: 6"      Knee/Hip Exercises: Supine   Short Arc Quad Sets  Strengthening;Left;2 sets;10 reps;Limitations    Short Arc Quad Sets Limitations  3#      Modalities   Modalities  Environmental manager  L knee    Engineer, manufacturing  IFC    Electrical Stimulation Parameters  80-150 hz x 15 min    Electrical Stimulation Goals  Pain;Edema      Vasopneumatic   Number Minutes Vasopneumatic   15 minutes    Vasopnuematic Location   Knee    Vasopneumatic Pressure  Medium    Vasopneumatic Temperature   34      Manual Therapy   Manual Therapy  Soft tissue mobilization;Passive ROM    Soft tissue mobilization  L patella and incision mobilizations to improve functional mobility; STW to L ITB, quad to reduce tightness that may affect ROM    Passive ROM  PROM of L knee into flexion with holds at end range               PT Short Term Goals - 03/09/18 1426      PT SHORT TERM GOAL #1   Title  STG's= LTG's.        PT Long Term Goals - 03/16/18 1354      PT LONG TERM GOAL #1   Title  Independent with a HEP.    Time  8    Period  Weeks    Status  On-going      PT LONG TERM GOAL #2   Title  Active left knee flexion to 115 degrees+ so the patient can perform functional tasks and do so with pain not > 2-3/10.    Time  8    Period  Weeks    Status  On-going      PT LONG TERM GOAL #3   Title  Perform ADL's with pain not > 3/10.    Time  8    Period  Weeks    Status  On-going      PT LONG TERM GOAL #4   Title  Increase left knee strength to a  solid 4+/5 to provide good stability for accomplishment of functional activities.    Time  8    Period  Weeks    Status  On-going      PT LONG TERM GOAL #5   Title  Perform a reciprocating stair gait with one railing with pain not > 2-3/10.    Time  8    Period  Weeks    Status  On-going            Plan - 03/16/18 1352     Clinical Impression Statement  Patient tolerated today's treatment fairly well as she arrived with reports of L knee tightness. Patient able to complete exercises as directed but limited due to tightness. Upon observation of LLE, swelling indicated from L knee to L ankle with pitting indicated surrounding the L patella and in along L shin. Patient's L knee AROM measured as 0-84 deg today. Increased tightness as end range L knee flexion approached during PROM. STW completed to L ITB and quad to reduce muscle tightness that may limit ROM. Fair L patella mobility noted in sup/inf, lat/med directions and fairly good L incision mobility indicated as well. Normal modalities response noted following removal of the modalities.    Rehab Potential  Excellent    PT Frequency  2x / week    PT Duration  8 weeks    PT Treatment/Interventions  ADLs/Self Care Home Management;Electrical Stimulation;Cryotherapy;Gait training;Stair training;Functional mobility training;Therapeutic activities;Therapeutic exercise;Patient/family education;Neuromuscular re-education;Manual techniques;Passive range of motion;Vasopneumatic Device    PT Next Visit Plan  Continue with TKR protocol with focus on knee flexion and quad activition.    Consulted and Agree with Plan of Care  Patient       Patient will benefit from skilled therapeutic intervention in order to improve the following deficits and impairments:  Decreased activity tolerance, Increased edema, Decreased range of motion, Decreased strength  Visit Diagnosis: Stiffness of left knee, not elsewhere classified  Localized edema  Muscle weakness (generalized)     Problem List Patient Active Problem List   Diagnosis Date Noted  . Status post total knee replacement, 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 02/11/2018  . Iron deficiency anemia 04/28/2017  . Vitamin D deficiency 04/28/2017  . Osteopenia 04/28/2017  . Gastritis, erosive   . Abdominal pain, epigastric 12/12/2016  . Colon cancer  screening 11/03/2016  . Essential hypertension 11/03/2016  . Primary osteoarthritis of both knees 11/03/2016  . GERD (gastroesophageal reflux disease) 11/03/2016  . Age-related osteoporosis without current pathological fracture 11/03/2016  . Perforation of sigmoid colon (HCC) 06/29/2016    Marvell Fuller, PTA 03/16/2018, 2:14 PM  [facility] [address] [place], [place], [address] Phone: [phone]   Fax:  [phone]  Name: CLEDA IMEL MRN: [id number] Date of Birth: 11-23-1945

## 2018-03-18 ENCOUNTER — Ambulatory Visit: Payer: Medicare HMO | Admitting: Physical Therapy

## 2018-03-18 ENCOUNTER — Encounter: Payer: Self-pay | Admitting: Physical Therapy

## 2018-03-18 ENCOUNTER — Encounter: Payer: Medicare HMO | Admitting: Physical Therapy

## 2018-03-18 DIAGNOSIS — M6281 Muscle weakness (generalized): Secondary | ICD-10-CM

## 2018-03-18 DIAGNOSIS — R600 Localized edema: Secondary | ICD-10-CM | POA: Diagnosis not present

## 2018-03-18 DIAGNOSIS — M25662 Stiffness of left knee, not elsewhere classified: Secondary | ICD-10-CM

## 2018-03-18 NOTE — Therapy (Signed)
[facility] [address] [place], [place], [address] Phone: [phone]   Fax:  [phone]  Physical Therapy Treatment  Patient Details  Name: Tiffany Bradley MRN: [id number] Date of Birth: October 30, 1945 Referring Provider: Fuller Canada MD.   Encounter Date: 03/18/2018  PT End of Session - 03/18/18 1329    Visit Number  4    Number of Visits  16    Date for PT Re-Evaluation  06/06/18    Authorization Type  10TH VISIT PROGRESS NOTE AND KX MODIFIER AFTER THE 15 VISIT.        PT Start Time  1309    PT Stop Time  1402    PT Time Calculation (min)  53 min    Activity Tolerance  Patient tolerated treatment well    Behavior During Therapy  WFL for tasks assessed/performed       Past Medical History:  Diagnosis Date  . Arthritis   . Back pain   . Cataract   . DJD (degenerative joint disease)   . Full dentures   . GERD (gastroesophageal reflux disease)   . Glaucoma   . History of bleeding ulcers    patient reports multiple hospitalizations remotely  . Hypertension   . Wears glasses     Past Surgical History:  Procedure Laterality Date  . BREAST REDUCTION SURGERY Bilateral 04/25/2013   Procedure: MAMMARY REDUCTION  (BREAST);  Surgeon: Louisa Second, MD;  Location: [facility];  Service: Plastics;  Laterality: Bilateral;  . COLONOSCOPY     2008, normal per patient  . COLONOSCOPY N/A 01/16/2017   Procedure: COLONOSCOPY;  Surgeon: West Bali, MD;  [facility];  Service: Endoscopy;  Laterality: N/A;  830   . COSMETIC SURGERY    . ESOPHAGOGASTRODUODENOSCOPY N/A 01/16/2017   Procedure: ESOPHAGOGASTRODUODENOSCOPY (EGD);  Surgeon: West Bali, MD;  [facility];  Service: Endoscopy;  Laterality: N/A;  . PARTIAL COLECTOMY N/A 06/29/2016   Procedure: PARTIAL COLECTOMY;  Surgeon: Ancil Linsey, MD;  Location: [facility];  Service: General;  Laterality: N/A;  . TOTAL KNEE ARTHROPLASTY Left  02/11/2018   Procedure: TOTAL KNEE ARTHROPLASTY;  Surgeon: Vickki Hearing, MD;  Location: [facility];  Service: Orthopedics;  Laterality: Left;  DePuy   . TUBAL LIGATION      There were no vitals filed for this visit.  Subjective Assessment - 03/18/18 1328    Subjective  Reports stiffness and soreness in L knee. Will try morning treatments as she is less swollen in the mornings.    Pertinent History  OP; DJD;     Limitations  Standing    How long can you stand comfortably?  10 minutes.    How long can you walk comfortably?  Short community distances.    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    Patient Stated Goals  Get to walking better without pain.    Currently in Pain?  No/denies Reported soreness but did not rate and upon asking again reported just stiffness         [facility] PT Assessment - 03/18/18 0001      Assessment   Medical Diagnosis  S/p left total knee replacement.    Onset Date/Surgical Date  02/11/18    Next MD Visit  03/19/2018      Restrictions   Weight Bearing Restrictions  No      ROM / Strength   AROM / PROM /  Strength  AROM      AROM   Overall AROM   Deficits    AROM Assessment Site  Knee    Right/Left Knee  Left    Left Knee Flexion  90                   [facility] Adult PT Treatment/Exercise - 03/18/18 0001      Knee/Hip Exercises: Aerobic   Nustep  L4 x18 min      Knee/Hip Exercises: Standing   Heel Raises  Both;20 reps    Hip Flexion  AROM;Left;2 sets;10 reps;Knee bent    Forward Lunges  Left;2 sets;10 reps;2 seconds    Hip Abduction  AROM;Left;2 sets;10 reps;Knee straight      Knee/Hip Exercises: Supine   Other Supine Knee/Hip Exercises  LLE wall slides into flexion x15 reps      Modalities   Modalities  Electrical Stimulation;Vasopneumatic      Electrical Stimulation   Electrical Stimulation Location  L knee    Electrical Stimulation Action  IFC    Electrical Stimulation Parameters  80-150 hz x15  min    Electrical Stimulation Goals  Pain;Edema      Vasopneumatic   Number Minutes Vasopneumatic   15 minutes    Vasopnuematic Location   Knee    Vasopneumatic Pressure  Medium    Vasopneumatic Temperature   34               PT Short Term Goals - 03/09/18 1426      PT SHORT TERM GOAL #1   Title  STG's= LTG's.        PT Long Term Goals - 03/16/18 1354      PT LONG TERM GOAL #1   Title  Independent with a HEP.    Time  8    Period  Weeks    Status  On-going      PT LONG TERM GOAL #2   Title  Active left knee flexion to 115 degrees+ so the patient can perform functional tasks and do so with pain not > 2-3/10.    Time  8    Period  Weeks    Status  On-going      PT LONG TERM GOAL #3   Title  Perform ADL's with pain not > 3/10.    Time  8    Period  Weeks    Status  On-going      PT LONG TERM GOAL #4   Title  Increase left knee strength to a solid 4+/5 to provide good stability for accomplishment of functional activities.    Time  8    Period  Weeks    Status  On-going      PT LONG TERM GOAL #5   Title  Perform a reciprocating stair gait with one railing with pain not > 2-3/10.    Time  8    Period  Weeks    Status  On-going            Plan - 03/18/18 1421    Clinical Impression Statement  Patient continues to report stiffness and soreness following L TKR. Patient also continues to present with limited L knee flexion. Patient guided through more exercises for ROM with limitation of L knee flexion at approximately 90 deg. Wall slides also initiated without ankle weights with no complaints but facial grimacing noted. AROM of L knee flexion measured as 90 deg following wall slides. Normal modalities  response noted following removal of the modalities. Patient advised that vitamin E appropriate to apply to scarring.    Rehab Potential  Excellent    PT Frequency  2x / week    PT Duration  8 weeks    PT Treatment/Interventions  ADLs/Self Care Home  Management;Electrical Stimulation;Cryotherapy;Gait training;Stair training;Functional mobility training;Therapeutic activities;Therapeutic exercise;Patient/family education;Neuromuscular re-education;Manual techniques;Passive range of motion;Vasopneumatic Device    PT Next Visit Plan  Continue with TKR protocol with focus on knee flexion and quad activition.    Consulted and Agree with Plan of Care  Patient       Patient will benefit from skilled therapeutic intervention in order to improve the following deficits and impairments:  Decreased activity tolerance, Increased edema, Decreased range of motion, Decreased strength  Visit Diagnosis: Stiffness of left knee, not elsewhere classified  Localized edema  Muscle weakness (generalized)     Problem List Patient Active Problem List   Diagnosis Date Noted  . Status post total knee replacement, 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 02/11/2018  . Iron deficiency anemia 04/28/2017  . Vitamin D deficiency 04/28/2017  . Osteopenia 04/28/2017  . Gastritis, erosive   . Abdominal pain, epigastric 12/12/2016  . Colon cancer screening 11/03/2016  . Essential hypertension 11/03/2016  . Primary osteoarthritis of both knees 11/03/2016  . GERD (gastroesophageal reflux disease) 11/03/2016  . Age-related osteoporosis without current pathological fracture 11/03/2016  . Perforation of sigmoid colon (HCC) 06/29/2016    Marvell FullerKelsey P Denny Lave, PTA 03/18/2018, 2:25 PM  [facility] [address] [place], [place], [address] Phone: [phone]   Fax:  [phone]  Name: Anthony Sarva H Pickert MRN: [id number] Date of Birth: 04/05/1946

## 2018-03-19 ENCOUNTER — Ambulatory Visit (INDEPENDENT_AMBULATORY_CARE_PROVIDER_SITE_OTHER): Payer: Medicare HMO | Admitting: Orthopedic Surgery

## 2018-03-19 ENCOUNTER — Encounter: Payer: Self-pay | Admitting: Orthopedic Surgery

## 2018-03-19 VITALS — BP 127/70 | HR 80 | Ht 63.0 in | Wt 169.0 lb

## 2018-03-19 DIAGNOSIS — Z96652 Presence of left artificial knee joint: Secondary | ICD-10-CM

## 2018-03-19 MED ORDER — TRAMADOL HCL 50 MG PO TABS: 50.0000 mg | ORAL_TABLET | ORAL | 0 refills | Status: DC | PRN

## 2018-03-19 NOTE — Telephone Encounter (Signed)
Submitted prior authorization for Prolia to see if it will covered even though she is not able to start it right away.

## 2018-03-19 NOTE — Progress Notes (Signed)
POSTOP VISIT  POD # 36  Chief Complaint  Patient presents with  . Follow-up    Left knee Post Op 02/11/18    72 year old female status post left total knee.  She is had some issues with flexion and comes in today with about 85 degrees of flexion full extension walking with a cane doing well with her pain management but having trouble with bending her knee   Encounter Diagnosis  Name Primary?  . Status post total knee replacement, 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 Yes    I do not see any issues with the knee in terms of wound healing she does have some swelling in the knee not in the ankle or foot    Postoperative plan (Work, WB, No orders of the defined types were placed in this encounter. ,FU)  Continue therapy follow-up  5 weeks  Refill tramadol

## 2018-03-23 ENCOUNTER — Ambulatory Visit: Payer: Medicare HMO | Admitting: Physical Therapy

## 2018-03-23 ENCOUNTER — Encounter: Payer: Self-pay | Admitting: Physical Therapy

## 2018-03-23 DIAGNOSIS — M6281 Muscle weakness (generalized): Secondary | ICD-10-CM | POA: Diagnosis not present

## 2018-03-23 DIAGNOSIS — R600 Localized edema: Secondary | ICD-10-CM | POA: Diagnosis not present

## 2018-03-23 DIAGNOSIS — M25662 Stiffness of left knee, not elsewhere classified: Secondary | ICD-10-CM

## 2018-03-23 NOTE — Therapy (Signed)
[facility] [address] [place], [place], [address] Phone: [phone]   Fax:  [phone]  Physical Therapy Treatment  Patient Details  Name: Tiffany Bradley MRN: [id number] Date of Birth: 05/11/1946 Referring Provider: Fuller CanadaStanley Harrison MD.   Encounter Date: 03/23/2018  PT End of Session - 03/23/18 1002    Visit Number  5    Number of Visits  16    Date for PT Re-Evaluation  06/06/18    Authorization Type  10TH VISIT PROGRESS NOTE AND KX MODIFIER AFTER THE 15 VISIT.        PT Start Time  [phone]    PT Stop Time  1040    PT Time Calculation (min)  47 min    Activity Tolerance  Patient tolerated treatment well    Behavior During Therapy  WFL for tasks assessed/performed       Past Medical History:  Diagnosis Date  . Arthritis   . Back pain   . Cataract   . DJD (degenerative joint disease)   . Full dentures   . GERD (gastroesophageal reflux disease)   . Glaucoma   . History of bleeding ulcers    patient reports multiple hospitalizations remotely  . Hypertension   . Wears glasses     Past Surgical History:  Procedure Laterality Date  . BREAST REDUCTION SURGERY Bilateral 04/25/2013   Procedure: MAMMARY REDUCTION  (BREAST);  Surgeon: Louisa SecondGerald Truesdale, MD;  Location: [facility];  Service: Plastics;  Laterality: Bilateral;  . COLONOSCOPY     2008, normal per patient  . COLONOSCOPY N/A 01/16/2017   Procedure: COLONOSCOPY;  Surgeon: West BaliFields, Sandi L, MD;  [facility];  Service: Endoscopy;  Laterality: N/A;  830   . COSMETIC SURGERY    . ESOPHAGOGASTRODUODENOSCOPY N/A 01/16/2017   Procedure: ESOPHAGOGASTRODUODENOSCOPY (EGD);  Surgeon: West BaliFields, Sandi L, MD;  [facility];  Service: Endoscopy;  Laterality: N/A;  . PARTIAL COLECTOMY N/A 06/29/2016   Procedure: PARTIAL COLECTOMY;  Surgeon: Ancil LinseyJason Evan Davis, MD;  Location[facility];  Service: General;  Laterality: N/A;  . TOTAL KNEE ARTHROPLASTY Left  02/11/2018   Procedure: TOTAL KNEE ARTHROPLASTY;  Surgeon: Vickki HearingHarrison, Stanley E, MD;  Location[facility];  Service: Orthopedics;  Laterality: Left;  DePuy   . TUBAL LIGATION      There were no vitals filed for this visit.  Subjective Assessment - 03/23/18 0958    Subjective  Reports stiffness in L knee due to rainy weather. Reports that she knows she should try to push herself a little more. Reports that MD was pleased although wants focus on increasing knee flexion.    Pertinent History  OP; DJD;     Limitations  Standing    How long can you stand comfortably?  10 minutes.    How long can you walk comfortably?  Short community distances.    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    Patient Stated Goals  Get to walking better without pain.    Currently in Pain?  No/denies         [facility] - 03/23/18 0001      Assessment   Medical Diagnosis  S/p left total knee replacement.    Onset Date/Surgical Date  02/11/18    Next MD Visit  04/2018      Restrictions   Weight Bearing Restrictions  No      AROM   Overall AROM  Deficits    AROM Assessment Site  Knee    Right/Left Knee  Left    Left Knee Flexion  90                   [facility] Adult PT Treatment/Exercise - 03/23/18 0001      Knee/Hip Exercises: Aerobic   Nustep  L4 x16 min, seat 9      Knee/Hip Exercises: Standing   Forward Lunges  Left;2 sets;10 reps;2 seconds    Functional Squat  5 reps      Knee/Hip Exercises: Seated   Long Arc Quad  Strengthening;Left;2 sets;10 reps;Weights    Long Arc Quad Weight  4 lbs.      Knee/Hip Exercises: Supine   Other Supine Knee/Hip Exercises  LLE wall slides into flexion x15 reps      Modalities   Modalities  Electrical Stimulation;Vasopneumatic      Electrical Stimulation   Electrical Stimulation Location  L knee    Electrical Stimulation Action  IFC    Electrical Stimulation Parameters  80-150 hz x15 min    Electrical  Stimulation Goals  Edema      Vasopneumatic   Number Minutes Vasopneumatic   15 minutes    Vasopnuematic Location   Knee    Vasopneumatic Pressure  Medium    Vasopneumatic Temperature   34               PT Short Term Goals - 03/09/18 1426      PT SHORT TERM GOAL #1   Title  STG's= LTG's.        PT Long Term Goals - 03/16/18 1354      PT LONG TERM GOAL #1   Title  Independent with a HEP.    Time  8    Period  Weeks    Status  On-going      PT LONG TERM GOAL #2   Title  Active left knee flexion to 115 degrees+ so the patient can perform functional tasks and do so with pain not > 2-3/10.    Time  8    Period  Weeks    Status  On-going      PT LONG TERM GOAL #3   Title  Perform ADL's with pain not > 3/10.    Time  8    Period  Weeks    Status  On-going      PT LONG TERM GOAL #4   Title  Increase left knee strength to a solid 4+/5 to provide good stability for accomplishment of functional activities.    Time  8    Period  Weeks    Status  On-going      PT LONG TERM GOAL #5   Title  Perform a reciprocating stair gait with one railing with pain not > 2-3/10.    Time  8    Period  Weeks    Status  On-going            Plan - 03/23/18 1041    Clinical Impression Statement  Patient continues to present in clinic with reports of L knee stiffness. Patient pushed through flexion focused exercises as well as weightbearing exercises in which to improve L knee flexion. Patient's L knee AROM flexion measured as 90 deg still following therex session. Normal modalities response noted following removal of the modalities.    Rehab Potential  Excellent    PT Frequency  2x / week  PT Duration  8 weeks    PT Treatment/Interventions  ADLs/Self Care Home Management;Electrical Stimulation;Cryotherapy;Gait training;Stair training;Functional mobility training;Therapeutic activities;Therapeutic exercise;Patient/family education;Neuromuscular re-education;Manual  techniques;Passive range of motion;Vasopneumatic Device    PT Next Visit Plan  Continue with TKR protocol with focus on knee flexion and quad activition.    Consulted and Agree with Plan of Care  Patient       Patient will benefit from skilled therapeutic intervention in order to improve the following deficits and impairments:  Decreased activity tolerance, Increased edema, Decreased range of motion, Decreased strength  Visit Diagnosis: Stiffness of left knee, not elsewhere classified  Localized edema  Muscle weakness (generalized)     Problem List Patient Active Problem List   Diagnosis Date Noted  . Status post total knee replacement, 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 02/11/2018  . Iron deficiency anemia 04/28/2017  . Vitamin D deficiency 04/28/2017  . Osteopenia 04/28/2017  . Gastritis, erosive   . Abdominal pain, epigastric 12/12/2016  . Colon cancer screening 11/03/2016  . Essential hypertension 11/03/2016  . Primary osteoarthritis of both knees 11/03/2016  . GERD (gastroesophageal reflux disease) 11/03/2016  . Age-related osteoporosis without current pathological fracture 11/03/2016  . Perforation of sigmoid colon [facility]) 06/29/2016    Marvell Fuller, PTA 03/23/2018, 10:45 AM  [facility] [address] [place], [place], [address] Phone: [phone]   Fax:  [phone]  Name: Tiffany Bradley MRN: [id number] Date of Birth: 1946-07-24

## 2018-03-25 ENCOUNTER — Ambulatory Visit: Payer: Medicare HMO | Admitting: Physical Therapy

## 2018-03-25 ENCOUNTER — Encounter: Payer: Self-pay | Admitting: Physical Therapy

## 2018-03-25 DIAGNOSIS — R600 Localized edema: Secondary | ICD-10-CM

## 2018-03-25 DIAGNOSIS — M25662 Stiffness of left knee, not elsewhere classified: Secondary | ICD-10-CM | POA: Diagnosis not present

## 2018-03-25 DIAGNOSIS — M6281 Muscle weakness (generalized): Secondary | ICD-10-CM | POA: Diagnosis not present

## 2018-03-25 NOTE — Therapy (Signed)
[facility] [address] [place], [place], [address] Phone: [phone]   Fax:  [phone]  Physical Therapy Treatment  Patient Details  Name: Tiffany Bradley MRN: [id number] Date of Birth: Feb 18, 1946 Referring Provider: Fuller Canada MD.   Encounter Date: 03/25/2018  PT End of Session - 03/25/18 1014    Visit Number  6    Number of Visits  16    Date for PT Re-Evaluation  06/06/18    Authorization Type  10TH VISIT PROGRESS NOTE AND KX MODIFIER AFTER THE 15 VISIT.        PT Start Time  [phone]    PT Stop Time  1037    PT Time Calculation (min)  49 min    Activity Tolerance  Patient tolerated treatment well    Behavior During Therapy  WFL for tasks assessed/performed       Past Medical History:  Diagnosis Date  . Arthritis   . Back pain   . Cataract   . DJD (degenerative joint disease)   . Full dentures   . GERD (gastroesophageal reflux disease)   . Glaucoma   . History of bleeding ulcers    patient reports multiple hospitalizations remotely  . Hypertension   . Wears glasses     Past Surgical History:  Procedure Laterality Date  . BREAST REDUCTION SURGERY Bilateral 04/25/2013   Procedure: MAMMARY REDUCTION  (BREAST);  Surgeon: Louisa Second, MD;  Location: [facility];  Service: Plastics;  Laterality: Bilateral;  . COLONOSCOPY     2008, normal per patient  . COLONOSCOPY N/A 01/16/2017   Procedure: COLONOSCOPY;  Surgeon: West Bali, MD;  [facility];  Service: Endoscopy;  Laterality: N/A;  830   . COSMETIC SURGERY    . ESOPHAGOGASTRODUODENOSCOPY N/A 01/16/2017   Procedure: ESOPHAGOGASTRODUODENOSCOPY (EGD);  Surgeon: West Bali, MD;  [facility];  Service: Endoscopy;  Laterality: N/A;  . PARTIAL COLECTOMY N/A 06/29/2016   Procedure: PARTIAL COLECTOMY;  Surgeon: Ancil Linsey, MD;  Location: [facility];  Service: General;  Laterality: N/A;  . TOTAL KNEE ARTHROPLASTY Left  02/11/2018   Procedure: TOTAL KNEE ARTHROPLASTY;  Surgeon: Vickki Hearing, MD;  Location: [facility];  Service: Orthopedics;  Laterality: Left;  DePuy   . TUBAL LIGATION      There were no vitals filed for this visit.  Subjective Assessment - 03/25/18 0950    Subjective  Patient arrived with stiffness in knee    Pertinent History  OP; DJD;     Limitations  Standing    How long can you stand comfortably?  10 minutes.    How long can you walk comfortably?  Short community distances.    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    Patient Stated Goals  Get to walking better without pain.    Pain Location  Knee    Pain Orientation  Left    Pain Descriptors / Indicators  Tightness    Pain Type  Surgical pain    Pain Onset  More than a month ago    Pain Frequency  Intermittent    Aggravating Factors   knee flexion movement    Pain Relieving Factors  at rest         [facility] PT Assessment - 03/25/18 0001      ROM / Strength   AROM / PROM / Strength  AROM;PROM      AROM  Overall AROM   Deficits    AROM Assessment Site  Knee    Right/Left Knee  Left    Left Knee Flexion  88      PROM   Overall PROM   Deficits    PROM Assessment Site  Knee    Right/Left Knee  Left    Left Knee Flexion  100                   [facility] Adult PT Treatment/Exercise - 03/25/18 0001      Knee/Hip Exercises: Aerobic   Nustep  L5 adjusted foward for ROM      Electrical Stimulation   Electrical Stimulation Location  L knee    Electrical Stimulation Action  IFC    Electrical Stimulation Parameters  80-150hz  x73min    Electrical Stimulation Goals  Edema      Vasopneumatic   Number Minutes Vasopneumatic   15 minutes    Vasopnuematic Location   Knee    Vasopneumatic Pressure  Medium      Manual Therapy   Manual Therapy  Soft tissue mobilization;Passive ROM    Soft tissue mobilization  L patella and incision mobilizations to improve functional mobility;  STW to L ITB, quad to reduce tightness that may affect ROM    Passive ROM  PROM of L knee into flexion with holds at end range               PT Short Term Goals - 03/09/18 1426      PT SHORT TERM GOAL #1   Title  STG's= LTG's.        PT Long Term Goals - 03/16/18 1354      PT LONG TERM GOAL #1   Title  Independent with a HEP.    Time  8    Period  Weeks    Status  On-going      PT LONG TERM GOAL #2   Title  Active left knee flexion to 115 degrees+ so the patient can perform functional tasks and do so with pain not > 2-3/10.    Time  8    Period  Weeks    Status  On-going      PT LONG TERM GOAL #3   Title  Perform ADL's with pain not > 3/10.    Time  8    Period  Weeks    Status  On-going      PT LONG TERM GOAL #4   Title  Increase left knee strength to a solid 4+/5 to provide good stability for accomplishment of functional activities.    Time  8    Period  Weeks    Status  On-going      PT LONG TERM GOAL #5   Title  Perform a reciprocating stair gait with one railing with pain not > 2-3/10.    Time  8    Period  Weeks    Status  On-going            Plan - 03/25/18 1023    Clinical Impression Statement  Patient tolerated treatment well today. Patient pain ranges from 0/10 at rest and up to 8/10 with manual stretching. Today focused on ROM for left knee flexion. Patient improved flexion from 85 degrees AROM to 100 degrees passively. Patient was educated on self stretching daily to improve ROM and functional independence. Goals ongoing at this time.     Rehab Potential  Excellent  PT Frequency  2x / week    PT Duration  8 weeks    PT Treatment/Interventions  ADLs/Self Care Home Management;Electrical Stimulation;Cryotherapy;Gait training;Stair training;Functional mobility training;Therapeutic activities;Therapeutic exercise;Patient/family education;Neuromuscular re-education;Manual techniques;Passive range of motion;Vasopneumatic Device    PT Next Visit  Plan  Continue with TKR protocol with focus on knee flexion and quad activition.    Consulted and Agree with Plan of Care  Patient       Patient will benefit from skilled therapeutic intervention in order to improve the following deficits and impairments:  Decreased activity tolerance, Increased edema, Decreased range of motion, Decreased strength  Visit Diagnosis: Stiffness of left knee, not elsewhere classified  Localized edema  Muscle weakness (generalized)     Problem List Patient Active Problem List   Diagnosis Date Noted  . Status post total knee replacement, 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 02/11/2018  . Iron deficiency anemia 04/28/2017  . Vitamin D deficiency 04/28/2017  . Osteopenia 04/28/2017  . Gastritis, erosive   . Abdominal pain, epigastric 12/12/2016  . Colon cancer screening 11/03/2016  . Essential hypertension 11/03/2016  . Primary osteoarthritis of both knees 11/03/2016  . GERD (gastroesophageal reflux disease) 11/03/2016  . Age-related osteoporosis without current pathological fracture 11/03/2016  . Perforation of sigmoid colon (HCC) 06/29/2016    Lilliahna Schubring P, PTA 03/25/2018, 10:38 AM  [facility] [address] [place], [place], [address] Phone: [phone]   Fax:  [phone]  Name: Anthony Sarva H Singleton MRN: [id number] Date of Birth: 09/28/1945

## 2018-03-30 ENCOUNTER — Ambulatory Visit: Payer: Medicare HMO | Admitting: Physical Therapy

## 2018-03-30 DIAGNOSIS — M25662 Stiffness of left knee, not elsewhere classified: Secondary | ICD-10-CM

## 2018-03-30 DIAGNOSIS — M6281 Muscle weakness (generalized): Secondary | ICD-10-CM

## 2018-03-30 DIAGNOSIS — R600 Localized edema: Secondary | ICD-10-CM | POA: Diagnosis not present

## 2018-03-30 NOTE — Therapy (Signed)
[facility] [address] [place], [place], [address] Phone: [phone]   Fax:  [phone]  Physical Therapy Treatment  Patient Details  Name: Tiffany Bradley MRN: [id number] Date of Birth: 11-15-1945 Referring Provider: Fuller Canada MD.   Encounter Date: 03/30/2018  PT End of Session - 03/30/18 1035    Visit Number  7    Number of Visits  16    Date for PT Re-Evaluation  06/06/18    Authorization Type  10TH VISIT PROGRESS NOTE AND KX MODIFIER AFTER THE 15 VISIT.        PT Start Time  1032    PT Stop Time  1134    PT Time Calculation (min)  62 min    Activity Tolerance  Patient tolerated treatment well    Behavior During Therapy  WFL for tasks assessed/performed       Past Medical History:  Diagnosis Date  . Arthritis   . Back pain   . Cataract   . DJD (degenerative joint disease)   . Full dentures   . GERD (gastroesophageal reflux disease)   . Glaucoma   . History of bleeding ulcers    patient reports multiple hospitalizations remotely  . Hypertension   . Wears glasses     Past Surgical History:  Procedure Laterality Date  . BREAST REDUCTION SURGERY Bilateral 04/25/2013   Procedure: MAMMARY REDUCTION  (BREAST);  Surgeon: Louisa Second, MD;  Location: [facility];  Service: Plastics;  Laterality: Bilateral;  . COLONOSCOPY     2008, normal per patient  . COLONOSCOPY N/A 01/16/2017   Procedure: COLONOSCOPY;  Surgeon: West Bali, MD;  [facility];  Service: Endoscopy;  Laterality: N/A;  830   . COSMETIC SURGERY    . ESOPHAGOGASTRODUODENOSCOPY N/A 01/16/2017   Procedure: ESOPHAGOGASTRODUODENOSCOPY (EGD);  Surgeon: West Bali, MD;  [facility];  Service: Endoscopy;  Laterality: N/A;  . PARTIAL COLECTOMY N/A 06/29/2016   Procedure: PARTIAL COLECTOMY;  Surgeon: Ancil Linsey, MD;  Location: [facility];  Service: General;  Laterality: N/A;  . TOTAL KNEE ARTHROPLASTY Left  02/11/2018   Procedure: TOTAL KNEE ARTHROPLASTY;  Surgeon: Vickki Hearing, MD;  Location: [facility];  Service: Orthopedics;  Laterality: Left;  DePuy   . TUBAL LIGATION      There were no vitals filed for this visit.  Subjective Assessment - 03/30/18 1035    Subjective  Patient arrived with stiffness in knee    Pertinent History  OP; DJD;     Limitations  Standing    How long can you walk comfortably?  Short community distances.    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    Patient Stated Goals  Get to walking better without pain.    Currently in Pain?  No/denies                       [facility] Adult PT Treatment/Exercise - 03/30/18 0001      Self-Care   Self-Care  Other Self-Care Comments    Other Self-Care Comments   demonstrated multiple stretches to increase flexion including supine wall slide, seated with RLE assist and hooking on chair leg or floor      Knee/Hip Exercises: Aerobic   Nustep  L5 adjusted foward for ROM      Knee/Hip Exercises: Machines for Strengthening   Cybex Knee Extension  attempted with one plate; difficult  Total Gym Leg Press  1 plate [id number]      Knee/Hip Exercises: Standing   Terminal Knee Extension  Strengthening;Left;20 reps;Theraband 5 sec hold    Theraband Level (Terminal Knee Extension)  Level 2 (Red)    Wall Squat  15 reps difficult      Knee/Hip Exercises: Supine   Other Supine Knee/Hip Exercises  --      Modalities   Modalities  Electrical Stimulation;Vasopneumatic      Programme researcher, broadcasting/film/videolectrical Stimulation   Electrical Stimulation Location  l KNEE    Electrical Stimulation Action  IFC'    Electrical Stimulation Parameters  1-10 HZ X 15 MIN    Electrical Stimulation Goals  Edema      Vasopneumatic   Number Minutes Vasopneumatic   15 minutes    Vasopnuematic Location   Knee    Vasopneumatic Pressure  Medium      Manual Therapy   Manual Therapy  Joint mobilization;Myofascial release;Passive ROM     Joint Mobilization  in hooklying    Soft tissue mobilization  around incision    Myofascial Release  to distal incision    Passive ROM  into flexion in sitting and supine             PT Education - 03/30/18 1226    Education Details  see self care    Person(s) Educated  Patient    Methods  Explanation;Demonstration    Comprehension  Verbalized understanding;Returned demonstration       PT Short Term Goals - 03/09/18 1426      PT SHORT TERM GOAL #1   Title  STG's= LTG's.        PT Long Term Goals - 03/16/18 1354      PT LONG TERM GOAL #1   Title  Independent with a HEP.    Time  8    Period  Weeks    Status  On-going      PT LONG TERM GOAL #2   Title  Active left knee flexion to 115 degrees+ so the patient can perform functional tasks and do so with pain not > 2-3/10.    Time  8    Period  Weeks    Status  On-going      PT LONG TERM GOAL #3   Title  Perform ADL's with pain not > 3/10.    Time  8    Period  Weeks    Status  On-going      PT LONG TERM GOAL #4   Title  Increase left knee strength to a solid 4+/5 to provide good stability for accomplishment of functional activities.    Time  8    Period  Weeks    Status  On-going      PT LONG TERM GOAL #5   Title  Perform a reciprocating stair gait with one railing with pain not > 2-3/10.    Time  8    Period  Weeks    Status  On-going            Plan - 03/30/18 1226    Clinical Impression Statement  patient did well with TE today. She is still too weak with LAQ for knee ext machine but did well on leg press. PT emphasized need for patient to perform prolonged knee flexion stretches at home.    PT Treatment/Interventions  ADLs/Self Care Home Management;Electrical Stimulation;Cryotherapy;Gait training;Stair training;Functional mobility training;Therapeutic activities;Therapeutic exercise;Patient/family education;Neuromuscular re-education;Manual techniques;Passive range of motion;Vasopneumatic  Device  PT Next Visit Plan  Continue with TKR protocol with focus on knee flexion and quad activition.       Patient will benefit from skilled therapeutic intervention in order to improve the following deficits and impairments:  Decreased activity tolerance, Increased edema, Decreased range of motion, Decreased strength  Visit Diagnosis: Stiffness of left knee, not elsewhere classified  Localized edema  Muscle weakness (generalized)     Problem List Patient Active Problem List   Diagnosis Date Noted  . Status post total knee replacement, 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 02/11/2018  . Iron deficiency anemia 04/28/2017  . Vitamin D deficiency 04/28/2017  . Osteopenia 04/28/2017  . Gastritis, erosive   . Abdominal pain, epigastric 12/12/2016  . Colon cancer screening 11/03/2016  . Essential hypertension 11/03/2016  . Primary osteoarthritis of both knees 11/03/2016  . GERD (gastroesophageal reflux disease) 11/03/2016  . Age-related osteoporosis without current pathological fracture 11/03/2016  . Perforation of sigmoid colon (HCC) 06/29/2016    Deanndra Kirley PT 03/30/2018, 12:31 PM  [facility] [address] [place], [place], [address] Phone: [phone]   Fax:  [phone]  Name: CHRISMA HURLOCK MRN: [id number] Date of Birth: [birth year]

## 2018-04-01 ENCOUNTER — Ambulatory Visit: Payer: Medicare HMO | Attending: Orthopedic Surgery | Admitting: Physical Therapy

## 2018-04-01 ENCOUNTER — Encounter: Payer: Self-pay | Admitting: Physical Therapy

## 2018-04-01 DIAGNOSIS — R600 Localized edema: Secondary | ICD-10-CM | POA: Insufficient documentation

## 2018-04-01 DIAGNOSIS — M25662 Stiffness of left knee, not elsewhere classified: Secondary | ICD-10-CM | POA: Diagnosis not present

## 2018-04-01 DIAGNOSIS — M6281 Muscle weakness (generalized): Secondary | ICD-10-CM | POA: Diagnosis not present

## 2018-04-01 NOTE — Therapy (Signed)
[facility] [address] [place], [place], [address] Phone: [phone]   Fax:  [phone]  Physical Therapy Treatment  Patient Details  Name: Tiffany Bradley MRN: [id number] Date of Birth: 09-11-1945 Referring Provider: Fuller Canada MD.   Encounter Date: 04/01/2018  PT End of Session - 04/01/18 1015    Visit Number  8    Number of Visits  16    Date for PT Re-Evaluation  06/06/18    Authorization Type  10TH VISIT PROGRESS NOTE AND KX MODIFIER AFTER THE 15 VISIT.        PT Start Time  0945    PT Stop Time  1036    PT Time Calculation (min)  51 min    Activity Tolerance  Patient tolerated treatment well    Behavior During Therapy  WFL for tasks assessed/performed       Past Medical History:  Diagnosis Date  . Arthritis   . Back pain   . Cataract   . DJD (degenerative joint disease)   . Full dentures   . GERD (gastroesophageal reflux disease)   . Glaucoma   . History of bleeding ulcers    patient reports multiple hospitalizations remotely  . Hypertension   . Wears glasses     Past Surgical History:  Procedure Laterality Date  . BREAST REDUCTION SURGERY Bilateral 04/25/2013   Procedure: MAMMARY REDUCTION  (BREAST);  Surgeon: Louisa Second, MD;  Location: [facility];  Service: Plastics;  Laterality: Bilateral;  . COLONOSCOPY     2008, normal per patient  . COLONOSCOPY N/A 01/16/2017   Procedure: COLONOSCOPY;  Surgeon: West Bali, MD;  [facility];  Service: Endoscopy;  Laterality: N/A;  830   . COSMETIC SURGERY    . ESOPHAGOGASTRODUODENOSCOPY N/A 01/16/2017   Procedure: ESOPHAGOGASTRODUODENOSCOPY (EGD);  Surgeon: West Bali, MD;  [facility];  Service: Endoscopy;  Laterality: N/A;  . PARTIAL COLECTOMY N/A 06/29/2016   Procedure: PARTIAL COLECTOMY;  Surgeon: Ancil Linsey, MD;  Location[facility];  Service: General;  Laterality: N/A;  . 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    Procedure: TOTAL KNEE ARTHROPLASTY;  Surgeon: Vickki Hearing, MD;  Location[facility];  Service: Orthopedics;  Laterality: Left;  DePuy   . TUBAL LIGATION      There were no vitals filed for this visit.  Subjective Assessment - 04/01/18 0950    Subjective  Patient arrived with ongoing stiffness in knee     Pertinent History  OP; DJD;     Limitations  Standing    How long can you stand comfortably?  10 minutes.    How long can you walk comfortably?  Short community distances.    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    Patient Stated Goals  Get to walking better without pain.    Pain Location  Knee    Pain Orientation  Left    Pain Descriptors / Indicators  Tightness    Pain Type  Surgical pain    Pain Onset  More than a month ago    Pain Frequency  Intermittent    Aggravating Factors   knee flexion    Pain Relieving Factors  at rest         [facility] PT Assessment - 04/01/18 0001      AROM   AROM Assessment Site  Knee    Right/Left Knee  Left  Left Knee Flexion  90      PROM   PROM Assessment Site  Knee    Right/Left Knee  Left    Left Knee Flexion  100                   [facility] Adult PT Treatment/Exercise - 04/01/18 0001      Knee/Hip Exercises: Aerobic   Nustep  L5 adjusted foward for ROM      Knee/Hip Exercises: Seated   Long Arc Quad  Strengthening;Left;10 reps;Weights;3 sets    [employer] Weight  4 lbs.    Sit to [employer]  L knee    Engineer, manufacturing  [employer]    Electrical Stimulation Parameters  1-10hz  x67min    Electrical Stimulation Goals  Edema      Vasopneumatic   Number Minutes Vasopneumatic   15 minutes    Vasopnuematic Location   Knee    Vasopneumatic Pressure  Medium      Manual Therapy   Manual Therapy  Passive ROM;Soft tissue mobilization    Soft tissue mobilization  around incision    Passive ROM  manual  stretching in supine for left knee flexion to improve ROM               PT Short Term Goals - 03/09/18 1426      PT SHORT TERM GOAL #1   Title  STG's= LTG's.        PT Long Term Goals - 03/16/18 1354      PT LONG TERM GOAL #1   Title  Independent with a HEP.    Time  8    Period  Weeks    Status  On-going      PT LONG TERM GOAL #2   Title  Active left knee flexion to 115 degrees+ so the patient can perform functional tasks and do so with pain not > 2-3/10.    Time  8    Period  Weeks    Status  On-going      PT LONG TERM GOAL #3   Title  Perform ADL's with pain not > 3/10.    Time  8    Period  Weeks    Status  On-going      PT LONG TERM GOAL #4   Title  Increase left knee strength to a solid 4+/5 to provide good stability for accomplishment of functional activities.    Time  8    Period  Weeks    Status  On-going      PT LONG TERM GOAL #5   Title  Perform a reciprocating stair gait with one railing with pain not > 2-3/10.    Time  8    Period  Weeks    Status  On-going            Plan - 04/01/18 1022    Clinical Impression Statement  Patient tolerated treatment well today. Patient continues to have stiffness in right knee for flexion. Patient reported doing self stretches yet it is difficult to do too much. Today focused on ROM manual to improve ROM and functional independence. Patient able to improve ROM after stretching activities today. Goals ongoing at this time.     Rehab Potential  Excellent    PT Frequency  2x / week    PT Duration  8 weeks  PT Treatment/Interventions  ADLs/Self Care Home Management;Electrical Stimulation;Cryotherapy;Gait training;Stair training;Functional mobility training;Therapeutic activities;Therapeutic exercise;Patient/family education;Neuromuscular re-education;Manual techniques;Passive range of motion;Vasopneumatic Device    PT Next Visit Plan  Continue with TKR protocol with focus on knee flexion and quad activition.     Consulted and Agree with Plan of Care  Patient       Patient will benefit from skilled therapeutic intervention in order to improve the following deficits and impairments:  Decreased activity tolerance, Increased edema, Decreased range of motion, Decreased strength  Visit Diagnosis: Stiffness of left knee, not elsewhere classified  Localized edema  Muscle weakness (generalized)     Problem List Patient Active Problem List   Diagnosis Date Noted  . Status post total knee replacement, 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 02/11/2018  . Iron deficiency anemia 04/28/2017  . Vitamin D deficiency 04/28/2017  . Osteopenia 04/28/2017  . Gastritis, erosive   . Abdominal pain, epigastric 12/12/2016  . Colon cancer screening 11/03/2016  . Essential hypertension 11/03/2016  . Primary osteoarthritis of both knees 11/03/2016  . GERD (gastroesophageal reflux disease) 11/03/2016  . Age-related osteoporosis without current pathological fracture 11/03/2016  . Perforation of sigmoid colon (HCC) 06/29/2016    Nichola Warren P, PTA 04/01/2018, 10:39 AM  [facility] [address] [place], [place], [address] Phone: [phone]   Fax:  [phone]  Name: Tiffany Bradley MRN: [id number] Date of Birth: 09/26/1945

## 2018-04-05 ENCOUNTER — Telehealth: Payer: Self-pay | Admitting: Orthopedic Surgery

## 2018-04-05 DIAGNOSIS — Z96652 Presence of left artificial knee joint: Secondary | ICD-10-CM

## 2018-04-05 MED ORDER — TRAMADOL HCL 50 MG PO TABS: 50.0000 mg | ORAL_TABLET | ORAL | 0 refills | Status: DC | PRN

## 2018-04-05 NOTE — Telephone Encounter (Signed)
Patient of Dr [facility] called for refill:  traMADol (ULTRAM) 50 MG tablet 42 tablet    [facility], [place]   - patient aware Dr Romeo AppleHarrison is out this week and that Dr Hilda LiasKeeling is reviewing refill requests.

## 2018-04-06 ENCOUNTER — Encounter: Payer: Self-pay | Admitting: Physical Therapy

## 2018-04-06 ENCOUNTER — Ambulatory Visit: Payer: Medicare HMO | Admitting: Physical Therapy

## 2018-04-06 DIAGNOSIS — M6281 Muscle weakness (generalized): Secondary | ICD-10-CM

## 2018-04-06 DIAGNOSIS — M25662 Stiffness of left knee, not elsewhere classified: Secondary | ICD-10-CM

## 2018-04-06 DIAGNOSIS — R600 Localized edema: Secondary | ICD-10-CM | POA: Diagnosis not present

## 2018-04-06 NOTE — Therapy (Signed)
[facility] [address] [place], [place], [address] Phone: [phone]   Fax:  [phone]  Physical Therapy Treatment  Patient Details  Name: Tiffany Bradley MRN: [id number] Date of Birth: 02/15/1946 Referring Provider: Fuller CanadaStanley Harrison MD.   Encounter Date: 04/06/2018  PT End of Session - 04/06/18 0955    Visit Number  9    Number of Visits  16    Date for PT Re-Evaluation  06/06/18    Authorization Type  10TH VISIT PROGRESS NOTE AND KX MODIFIER AFTER THE 15 VISIT.        PT Start Time  [phone]    PT Stop Time  1040    PT Time Calculation (min)  51 min    Activity Tolerance  Patient tolerated treatment well    Behavior During Therapy  WFL for tasks assessed/performed       Past Medical History:  Diagnosis Date  . Arthritis   . Back pain   . Cataract   . DJD (degenerative joint disease)   . Full dentures   . GERD (gastroesophageal reflux disease)   . Glaucoma   . History of bleeding ulcers    patient reports multiple hospitalizations remotely  . Hypertension   . Wears glasses     Past Surgical History:  Procedure Laterality Date  . BREAST REDUCTION SURGERY Bilateral 04/25/2013   Procedure: MAMMARY REDUCTION  (BREAST);  Surgeon: Louisa SecondGerald Truesdale, MD;  Location: [facility];  Service: Plastics;  Laterality: Bilateral;  . COLONOSCOPY     2008, normal per patient  . COLONOSCOPY N/A 01/16/2017   Procedure: COLONOSCOPY;  Surgeon: West BaliFields, Sandi L, MD;  [facility];  Service: Endoscopy;  Laterality: N/A;  830   . COSMETIC SURGERY    . ESOPHAGOGASTRODUODENOSCOPY N/A 01/16/2017   Procedure: ESOPHAGOGASTRODUODENOSCOPY (EGD);  Surgeon: West BaliFields, Sandi L, MD;  [facility];  Service: Endoscopy;  Laterality: N/A;  . PARTIAL COLECTOMY N/A 06/29/2016   Procedure: PARTIAL COLECTOMY;  Surgeon: Ancil LinseyJason Evan Davis, MD;  Location: [facility];  Service: General;  Laterality: N/A;  . 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    Procedure: TOTAL KNEE ARTHROPLASTY;  Surgeon: Vickki HearingHarrison, Stanley E, MD;  Location: [facility];  Service: Orthopedics;  Laterality: Left;  DePuy   . TUBAL LIGATION      There were no vitals filed for this visit.  Subjective Assessment - 04/06/18 0954    Subjective  Reports on-going stiffness in knee. Reports swelling in L ankle as well.    Pertinent History  OP; DJD;     Limitations  Standing    How long can you stand comfortably?  10 minutes.    How long can you walk comfortably?  Short community distances.    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    Patient Stated Goals  Get to walking better without pain.    Currently in Pain?  No/denies         [facility] - 04/06/18 0001      Assessment   Medical Diagnosis  S/p left total knee replacement.    Onset Date/Surgical Date  02/11/18    Next MD Visit  04/2018      Observation/Other Assessments-Edema    Edema  Circumferential      Circumferential Edema   Circumferential - Right  R knee 40.7 cm, R mid calf 30.8 cm, R ankle 23.6    Circumferential - Left  L knee 45 cm, L mid calf 31. cm, L ankle 24.6 cm      AROM   Left Knee Flexion  100                   [facility] Adult PT Treatment/Exercise - 04/06/18 0001      Knee/Hip Exercises: Aerobic   Stationary Bike  Partial revolutions on seat 9-7 x15 min      Knee/Hip Exercises: Machines for Strengthening   Cybex Knee Extension  10# x20 reps    Total Gym Leg Press  1 pl, seat 4 x10 reps      Modalities   Modalities  Vasopneumatic      Vasopneumatic   Number Minutes Vasopneumatic   15 minutes    Vasopnuematic Location   Knee    Vasopneumatic Pressure  Medium    Vasopneumatic Temperature   34      Manual Therapy   Manual Therapy  Passive ROM    Passive ROM  PROM of L knee into flexion                PT Short Term Goals - 03/09/18 1426      PT SHORT TERM GOAL #1   Title  STG's= LTG's.        PT Long Term  Goals - 03/16/18 1354      PT LONG TERM GOAL #1   Title  Independent with a HEP.    Time  8    Period  Weeks    Status  On-going      PT LONG TERM GOAL #2   Title  Active left knee flexion to 115 degrees+ so the patient can perform functional tasks and do so with pain not > 2-3/10.    Time  8    Period  Weeks    Status  On-going      PT LONG TERM GOAL #3   Title  Perform ADL's with pain not > 3/10.    Time  8    Period  Weeks    Status  On-going      PT LONG TERM GOAL #4   Title  Increase left knee strength to a solid 4+/5 to provide good stability for accomplishment of functional activities.    Time  8    Period  Weeks    Status  On-going      PT LONG TERM GOAL #5   Title  Perform a reciprocating stair gait with one railing with pain not > 2-3/10.    Time  8    Period  Weeks    Status  On-going            Plan - 04/06/18 1111    Clinical Impression Statement  Patient tolerated today's treatment fair as she was progressed to more ROM focused exercises with strengthening. Patient encouraged to complete full range on knee extension and greater VCs for proper leg press technique. Patient very fatigued following machine strengthening. Patient's L knee and calf observed with edema and pitting also noted with PROM and hold on LE. Edema measurements provided in objective of today's note. AROM L knee flexion 100 deg. Normal modalities response noted following removal of the modalities.    Rehab Potential  Excellent    PT Frequency  2x / week    PT Duration  8 weeks    PT Treatment/Interventions  ADLs/Self Care Home Management;Electrical Stimulation;Cryotherapy;Gait training;Stair training;Functional mobility training;Therapeutic activities;Therapeutic exercise;Patient/family education;Neuromuscular re-education;Manual  techniques;Passive range of motion;Vasopneumatic Device    PT Next Visit Plan  Continue with TKR protocol with focus on knee flexion and quad activition.     Consulted and Agree with Plan of Care  Patient       Patient will benefit from skilled therapeutic intervention in order to improve the following deficits and impairments:  Decreased activity tolerance, Increased edema, Decreased range of motion, Decreased strength  Visit Diagnosis: Stiffness of left knee, not elsewhere classified  Localized edema  Muscle weakness (generalized)     Problem List Patient Active Problem List   Diagnosis Date Noted  . Status post total knee replacement, 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 02/11/2018  . Iron deficiency anemia 04/28/2017  . Vitamin D deficiency 04/28/2017  . Osteopenia 04/28/2017  . Gastritis, erosive   . Abdominal pain, epigastric 12/12/2016  . Colon cancer screening 11/03/2016  . Essential hypertension 11/03/2016  . Primary osteoarthritis of both knees 11/03/2016  . GERD (gastroesophageal reflux disease) 11/03/2016  . Age-related osteoporosis without current pathological fracture 11/03/2016  . Perforation of sigmoid colon (HCC) 06/29/2016    Marvell Fuller, PTA 04/06/2018, 11:53 AM  [facility] [address] [place], [place], [address] Phone: [phone]   Fax:  [phone]  Name: Tiffany Bradley MRN: [id number] Date of Birth: 12-Mar-1946

## 2018-04-08 ENCOUNTER — Encounter: Payer: Self-pay | Admitting: Physical Therapy

## 2018-04-08 ENCOUNTER — Ambulatory Visit: Payer: Medicare HMO | Admitting: Physical Therapy

## 2018-04-08 DIAGNOSIS — M25662 Stiffness of left knee, not elsewhere classified: Secondary | ICD-10-CM

## 2018-04-08 DIAGNOSIS — R600 Localized edema: Secondary | ICD-10-CM

## 2018-04-08 DIAGNOSIS — M6281 Muscle weakness (generalized): Secondary | ICD-10-CM

## 2018-04-08 NOTE — Therapy (Signed)
[facility] [address] [place], [place], [address] Phone: [phone]   Fax:  [phone]  Physical Therapy Treatment   Progress Note Reporting Period 03/09/18 to 04/08/18  See note below for Objective Data and Assessment of Progress/Goals.      Patient Details  Name: Tiffany Bradley MRN: [id number] Date of Birth: 11/24/1945 Referring Provider: Fuller CanadaStanley Harrison MD.   Encounter Date: 04/08/2018  PT End of Session - 04/08/18 1015    Visit Number  10    Number of Visits  16    Date for PT Re-Evaluation  06/06/18    Authorization Type  10TH VISIT PROGRESS NOTE AND KX MODIFIER AFTER THE 15 VISIT.        PT Start Time  0945    PT Stop Time  1033    PT Time Calculation (min)  48 min    Activity Tolerance  Patient tolerated treatment well    Behavior During Therapy  WFL for tasks assessed/performed       Past Medical History:  Diagnosis Date  . Arthritis   . Back pain   . Cataract   . DJD (degenerative joint disease)   . Full dentures   . GERD (gastroesophageal reflux disease)   . Glaucoma   . History of bleeding ulcers    patient reports multiple hospitalizations remotely  . Hypertension   . Wears glasses     Past Surgical History:  Procedure Laterality Date  . BREAST REDUCTION SURGERY Bilateral 04/25/2013   Procedure: MAMMARY REDUCTION  (BREAST);  Surgeon: Louisa SecondGerald Truesdale, MD;  Location: [facility];  Service: Plastics;  Laterality: Bilateral;  . COLONOSCOPY     2008, normal per patient  . COLONOSCOPY N/A 01/16/2017   Procedure: COLONOSCOPY;  Surgeon: West BaliFields, Sandi L, MD;  [facility];  Service: Endoscopy;  Laterality: N/A;  830   . COSMETIC SURGERY    . ESOPHAGOGASTRODUODENOSCOPY N/A 01/16/2017   Procedure: ESOPHAGOGASTRODUODENOSCOPY (EGD);  Surgeon: West BaliFields, Sandi L, MD;  [facility];  Service: Endoscopy;  Laterality: N/A;  . PARTIAL COLECTOMY N/A 06/29/2016   Procedure: PARTIAL  COLECTOMY;  Surgeon: Ancil LinseyJason Evan Davis, MD;  Location: [facility];  Service: General;  Laterality: N/A;  . 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 TUBAL LIGATION      There were no vitals filed for this visit.  Subjective Assessment - 04/08/18 0953    Subjective  Reports ongoing stiffness in knee no pain unless stretching knee    Pertinent History  OP; DJD;     Limitations  Standing    How long can you stand comfortably?  10 minutes.    How long can you walk comfortably?  Short community distances.    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    Patient Stated Goals  Get to walking better without pain.    Currently in Pain?  No/denies         [facility] - 04/08/18 0001      AROM   AROM Assessment Site  Knee    Right/Left Knee  Left    Left Knee Flexion  93      PROM   PROM Assessment Site  Knee    Right/Left Knee  Left    Left Knee Flexion  102  [facility] Adult PT Treatment/Exercise - 04/08/18 0001      Knee/Hip Exercises: Aerobic   Nustep  L5 adjusted foward for ROM      Knee/Hip Exercises: Machines for Strengthening   Cybex Knee Extension  10# x20 reps      Programme researcher, broadcasting/film/video Location  L knee    Electrical Stimulation Action  IFC    Electrical Stimulation Parameters  1-10hz  x30min    Electrical Stimulation Goals  Edema      Vasopneumatic   Number Minutes Vasopneumatic   15 minutes    Vasopnuematic Location   Knee    Vasopneumatic Pressure  Medium      Manual Therapy   Manual Therapy  Passive ROM    Passive ROM  manual PROM of L knee into flexion with low load holds to improve ROM                   PT Long Term Goals - 04/08/18 1005      PT LONG TERM GOAL #1   Title  Independent with a HEP.    Time  8     Period  Weeks    Status  On-going      PT LONG TERM GOAL #2   Title  Active left knee flexion to 115 degrees+ so the patient can perform functional tasks and do so with pain not > 2-3/10.    Time  8    Period  Weeks    Status  On-going      PT LONG TERM GOAL #3   Title  Perform ADL's with pain not > 3/10.    Time  8    Period  Weeks    Status  On-going      PT LONG TERM GOAL #4   Title  Increase left knee strength to a solid 4+/5 to provide good stability for accomplishment of functional activities.    Time  8    Period  Weeks    Status  On-going      PT LONG TERM GOAL #5   Title  Perform a reciprocating stair gait with one railing with pain not > 2-3/10.    Time  8    Period  Weeks    Status  On-going            Plan - 04/08/18 1019    Clinical Impression Statement  Patient tolerated treatment well today. Patient has ongoing stiffness reported. Patient only has pain with stretching and at night or wthout meds. Patient has improved ROM in left knee after stretching yet limited due to edema. Patient progressing toward goals.     Rehab Potential  Excellent    PT Frequency  2x / week    PT Duration  8 weeks    PT Treatment/Interventions  ADLs/Self Care Home Management;Electrical Stimulation;Cryotherapy;Gait training;Stair training;Functional mobility training;Therapeutic activities;Therapeutic exercise;Patient/family education;Neuromuscular re-education;Manual techniques;Passive range of motion;Vasopneumatic Device    PT Next Visit Plan  Continue with TKR protocol with focus on knee flexion and quad activition.    Consulted and Agree with Plan of Care  Patient       Patient will benefit from skilled therapeutic intervention in order to improve the following deficits and impairments:  Decreased activity tolerance, Increased edema, Decreased range of motion, Decreased strength  Visit Diagnosis: Stiffness of left knee, not elsewhere classified  Localized edema  Muscle  weakness (generalized)     Problem List Patient Active  Problem List   Diagnosis Date Noted  . Status post total knee replacement, 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 02/11/2018  . Iron deficiency anemia 04/28/2017  . Vitamin D deficiency 04/28/2017  . Osteopenia 04/28/2017  . Gastritis, erosive   . Abdominal pain, epigastric 12/12/2016  . Colon cancer screening 11/03/2016  . Essential hypertension 11/03/2016  . Primary osteoarthritis of both knees 11/03/2016  . GERD (gastroesophageal reflux disease) 11/03/2016  . Age-related osteoporosis without current pathological fracture 11/03/2016  . Perforation of sigmoid colon [facility]) 06/29/2016    Cathie Hoops, PTA 04/08/18 10:36 AM  [facility] [address] [place], [place], [address] Phone: [phone]   Fax:  [phone]  Name: VERSIA MIGNOGNA MRN: [id number] Date of Birth: Jan 16, 1946

## 2018-04-13 ENCOUNTER — Ambulatory Visit: Payer: Medicare HMO | Admitting: *Deleted

## 2018-04-13 DIAGNOSIS — R600 Localized edema: Secondary | ICD-10-CM | POA: Diagnosis not present

## 2018-04-13 DIAGNOSIS — M6281 Muscle weakness (generalized): Secondary | ICD-10-CM

## 2018-04-13 DIAGNOSIS — M25662 Stiffness of left knee, not elsewhere classified: Secondary | ICD-10-CM

## 2018-04-13 NOTE — Therapy (Signed)
[facility] [address] [place], [place], [address] Phone: [phone]   Fax:  [phone]  Physical Therapy Treatment  Patient Details  Name: Tiffany Bradley MRN: [id number] Date of Birth: 08/25/1946 Referring Provider: Fuller CanadaStanley Harrison MD.   Encounter Date: 04/13/2018  PT End of Session - 04/13/18 1311    Visit Number  11    Number of Visits  16    Date for PT Re-Evaluation  06/06/18    Authorization Type  10TH VISIT PROGRESS NOTE AND KX MODIFIER AFTER THE 15 VISIT.        PT Start Time  1301    PT Stop Time  1355    PT Time Calculation (min)  54 min       Past Medical History:  Diagnosis Date  . Arthritis   . Back pain   . Cataract   . DJD (degenerative joint disease)   . Full dentures   . GERD (gastroesophageal reflux disease)   . Glaucoma   . History of bleeding ulcers    patient reports multiple hospitalizations remotely  . Hypertension   . Wears glasses     Past Surgical History:  Procedure Laterality Date  . BREAST REDUCTION SURGERY Bilateral 04/25/2013   Procedure: MAMMARY REDUCTION  (BREAST);  Surgeon: Louisa SecondGerald Truesdale, MD;  Location: [facility];  Service: Plastics;  Laterality: Bilateral;  . COLONOSCOPY     2008, normal per patient  . COLONOSCOPY N/A 01/16/2017   Procedure: COLONOSCOPY;  Surgeon: West BaliFields, Sandi L, MD;  [facility];  Service: Endoscopy;  Laterality: N/A;  830   . COSMETIC SURGERY    . ESOPHAGOGASTRODUODENOSCOPY N/A 01/16/2017   Procedure: ESOPHAGOGASTRODUODENOSCOPY (EGD);  Surgeon: West BaliFields, Sandi L, MD;  [facility];  Service: Endoscopy;  Laterality: N/A;  . PARTIAL COLECTOMY N/A 06/29/2016   Procedure: PARTIAL COLECTOMY;  Surgeon: Ancil LinseyJason Evan Davis, MD;  Location: [facility];  Service: General;  Laterality: N/A;  . 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   Procedure: TOTAL KNEE ARTHROPLASTY;  Surgeon: Vickki HearingHarrison, Stanley E, MD;  Location: [facility];  Service: Orthopedics;   Laterality: Left;  DePuy   . TUBAL LIGATION      There were no vitals filed for this visit.  Subjective Assessment - 04/13/18 1310    Subjective  Reports ongoing stiffness in knee no pain unless stretching knee    Pertinent History  OP; DJD;     Limitations  Standing    How long can you stand comfortably?  10 minutes.    How long can you walk comfortably?  Short community distances.    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    Patient Stated Goals  Get to walking better without pain.    Currently in Pain?  No/denies    Pain Location  Knee    Pain Orientation  Left    Pain Descriptors / Indicators  Tightness    Pain Type  Surgical pain    Pain Onset  More than a month ago    Pain Frequency  Intermittent                       [facility] Adult PT Treatment/Exercise - 04/13/18 0001      Knee/Hip Exercises: Aerobic   Nustep  15min L5 adjusted foward for ROM      Knee/Hip Exercises: Machines for Strengthening   Cybex Knee Extension  10# x 30  reps  with flexion stretch B/W each set      Modalities   Modalities  Vasopneumatic      Electrical Stimulation   Electrical Stimulation Location  L knee  IFC x 15 mins 1-10hz  x 15 mins    Electrical Stimulation Goals  Edema      Vasopneumatic   Number Minutes Vasopneumatic   15 minutes    Vasopnuematic Location   Knee    Vasopneumatic Pressure  Medium    Vasopneumatic Temperature   34      Manual Therapy   Manual Therapy  Passive ROM    Soft tissue mobilization  around incision    Passive ROM  manual PROM of L knee into flexion with low load holds to improve ROM  in sitting                PT Short Term Goals - 03/09/18 1426      PT SHORT TERM GOAL #1   Title  STG's= LTG's.        PT Long Term Goals - 04/08/18 1005      PT LONG TERM GOAL #1   Title  Independent with a HEP.    Time  8    Period  Weeks    Status  On-going      PT LONG TERM GOAL #2   Title  Active left  knee flexion to 115 degrees+ so the patient can perform functional tasks and do so with pain not > 2-3/10.    Time  8    Period  Weeks    Status  On-going   AROM 93 degrees 04/08/18     PT LONG TERM GOAL #3   Title  Perform ADL's with pain not > 3/10.    Time  8    Period  Weeks    Status  On-going      PT LONG TERM GOAL #4   Title  Increase left knee strength to a solid 4+/5 to provide good stability for accomplishment of functional activities.    Time  8    Period  Weeks    Status  On-going      PT LONG TERM GOAL #5   Title  Perform a reciprocating stair gait with one railing with pain not > 2-3/10.    Time  8    Period  Weeks    Status  On-going            Plan - 04/13/18 1311    Clinical Impression Statement  Pt did well with Rx today and was able to complete all therex and act.'s for LT knee with mainly discomfort when stretching. PROM today flexion to 100 degrees in sitting. Circumference 42.5 cms today.   Normal response to modalities today.    Clinical Presentation  Stable    Rehab Potential  Excellent    PT Frequency  2x / week    PT Duration  8 weeks    PT Treatment/Interventions  ADLs/Self Care Home Management;Electrical Stimulation;Cryotherapy;Gait training;Stair training;Functional mobility training;Therapeutic activities;Therapeutic exercise;Patient/family education;Neuromuscular re-education;Manual techniques;Passive range of motion;Vasopneumatic Device    PT Next Visit Plan  Continue with TKR protocol with focus on knee flexion and quad activition.    Consulted and Agree with Plan of Care  Patient       Patient will benefit from skilled therapeutic intervention in order to improve the following deficits and impairments:  Decreased activity tolerance, Increased edema, Decreased range of motion, Decreased  strength  Visit Diagnosis: Stiffness of left knee, not elsewhere classified  Localized edema  Muscle weakness (generalized)     Problem  List Patient Active Problem List   Diagnosis Date Noted  . Status post total knee replacement, 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 02/11/2018  . Iron deficiency anemia 04/28/2017  . Vitamin D deficiency 04/28/2017  . Osteopenia 04/28/2017  . Gastritis, erosive   . Abdominal pain, epigastric 12/12/2016  . Colon cancer screening 11/03/2016  . Essential hypertension 11/03/2016  . Primary osteoarthritis of both knees 11/03/2016  . GERD (gastroesophageal reflux disease) 11/03/2016  . Age-related osteoporosis without current pathological fracture 11/03/2016  . Perforation of sigmoid colon (HCC) 06/29/2016    RAMSEUR,CHRIS, PTA 04/13/2018, 2:07 PM  [facility] [address] [place], [place], [address] Phone: [phone]   Fax:  [phone]  Name: Tiffany Bradley MRN: [id number] Date of Birth: 1946-03-20

## 2018-04-15 ENCOUNTER — Encounter: Payer: Self-pay | Admitting: Physical Therapy

## 2018-04-15 ENCOUNTER — Ambulatory Visit: Payer: Medicare HMO | Admitting: Physical Therapy

## 2018-04-15 DIAGNOSIS — M25662 Stiffness of left knee, not elsewhere classified: Secondary | ICD-10-CM

## 2018-04-15 DIAGNOSIS — R600 Localized edema: Secondary | ICD-10-CM | POA: Diagnosis not present

## 2018-04-15 DIAGNOSIS — M6281 Muscle weakness (generalized): Secondary | ICD-10-CM | POA: Diagnosis not present

## 2018-04-15 NOTE — Therapy (Signed)
[facility] [address] [place], [place], [address] Phone: [phone]   Fax:  [phone]  Physical Therapy Treatment  Patient Details  Name: Tiffany Bradley MRN: [id number] Date of Birth: 16-Sep-1945 Referring Provider: Fuller Canada MD.   Encounter Date: 04/15/2018  PT End of Session - 04/15/18 1018    Visit Number  12    Number of Visits  16    Date for PT Re-Evaluation  06/06/18    Authorization Type  10TH VISIT PROGRESS NOTE AND KX MODIFIER AFTER THE 15 VISIT.        PT Start Time  [phone]    PT Stop Time  1045    PT Time Calculation (min)  49 min    Activity Tolerance  Patient tolerated treatment well    Behavior During Therapy  WFL for tasks assessed/performed       Past Medical History:  Diagnosis Date  . Arthritis   . Back pain   . Cataract   . DJD (degenerative joint disease)   . Full dentures   . GERD (gastroesophageal reflux disease)   . Glaucoma   . History of bleeding ulcers    patient reports multiple hospitalizations remotely  . Hypertension   . Wears glasses     Past Surgical History:  Procedure Laterality Date  . BREAST REDUCTION SURGERY Bilateral 04/25/2013   Procedure: MAMMARY REDUCTION  (BREAST);  Surgeon: Louisa Second, MD;  Location: [facility];  Service: Plastics;  Laterality: Bilateral;  . COLONOSCOPY     2008, normal per patient  . COLONOSCOPY N/A 01/16/2017   Procedure: COLONOSCOPY;  Surgeon: West Bali, MD;  [facility];  Service: Endoscopy;  Laterality: N/A;  830   . COSMETIC SURGERY    . ESOPHAGOGASTRODUODENOSCOPY N/A 01/16/2017   Procedure: ESOPHAGOGASTRODUODENOSCOPY (EGD);  Surgeon: West Bali, MD;  [facility];  Service: Endoscopy;  Laterality: N/A;  . PARTIAL COLECTOMY N/A 06/29/2016   Procedure: PARTIAL COLECTOMY;  Surgeon: Ancil Linsey, MD;  Location: [facility];  Service: General;  Laterality: N/A;  . TOTAL KNEE ARTHROPLASTY Left  02/11/2018   Procedure: TOTAL KNEE ARTHROPLASTY;  Surgeon: Vickki Hearing, MD;  Location: [facility];  Service: Orthopedics;  Laterality: Left;  DePuy   . TUBAL LIGATION      There were no vitals filed for this visit.  Subjective Assessment - 04/15/18 1007    Subjective  Reports ongoing stiffness in knee no pain unless stretching knee    Pertinent History  OP; DJD;     Limitations  Standing    How long can you stand comfortably?  10 minutes.    How long can you walk comfortably?  Short community distances.    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    Patient Stated Goals  Get to walking better without pain.    Currently in Pain?  No/denies         [facility] PT Assessment - 04/15/18 0001      AROM   AROM Assessment Site  Knee    Right/Left Knee  Left    Left Knee Flexion  94      PROM   PROM Assessment Site  Knee    Right/Left Knee  Left    Left Knee Flexion  104                   [facility] Adult PT Treatment/Exercise - 04/15/18  0001      Exercises   Exercises  Knee/Hip      Knee/Hip Exercises: Aerobic   Stationary Bike  Partial revolutions on seat 9-7 x5 min    Nustep  10min L5 adjusted for ROM      Knee/Hip Exercises: Machines for Strengthening   Cybex Knee Extension  10# x 30 reps  with flexion stretch B/W each set      Knee/Hip Exercises: Supine   Straight Leg Raise with External Rotation  Strengthening;Left;2 sets;10 reps      Electrical Stimulation   Electrical Stimulation Location  L knee  IFC x 15 mins 1-10hz  x 15 mins    Electrical Stimulation Goals  Edema      Vasopneumatic   Number Minutes Vasopneumatic   15 minutes    Vasopnuematic Location   Knee    Vasopneumatic Pressure  Medium      Manual Therapy   Manual Therapy  Passive ROM    Soft tissue mobilization  around incision    Passive ROM  manual PROM of L knee into flexion with low load holds to improve ROM  in sitting                PT Short Term  Goals - 03/09/18 1426      PT SHORT TERM GOAL #1   Title  STG's= LTG's.        PT Long Term Goals - 04/08/18 1005      PT LONG TERM GOAL #1   Title  Independent with a HEP.    Time  8    Period  Weeks    Status  On-going      PT LONG TERM GOAL #2   Title  Active left knee flexion to 115 degrees+ so the patient can perform functional tasks and do so with pain not > 2-3/10.    Time  8    Period  Weeks    Status  On-going   AROM 93 degrees 04/08/18     PT LONG TERM GOAL #3   Title  Perform ADL's with pain not > 3/10.    Time  8    Period  Weeks    Status  On-going      PT LONG TERM GOAL #4   Title  Increase left knee strength to a solid 4+/5 to provide good stability for accomplishment of functional activities.    Time  8    Period  Weeks    Status  On-going      PT LONG TERM GOAL #5   Title  Perform a reciprocating stair gait with one railing with pain not > 2-3/10.    Time  8    Period  Weeks    Status  On-going            Plan - 04/15/18 1028    Clinical Impression Statement  Patient continues to improve each visit with ROM. Today improve lft knee flexion to 102 passively. Patient has ongoing complaints of stiffness in knee with little discomfort. Patient has weakness in quad muscle and difficulty with going up/down stairs. Goals ongoing.     Rehab Potential  Excellent    PT Frequency  2x / week    PT Duration  8 weeks    PT Treatment/Interventions  ADLs/Self Care Home Management;Electrical Stimulation;Cryotherapy;Gait training;Stair training;Functional mobility training;Therapeutic activities;Therapeutic exercise;Patient/family education;Neuromuscular re-education;Manual techniques;Passive range of motion;Vasopneumatic Device    PT Next Visit Plan  Continue with  TKR protocol with focus on knee flexion and quad activition. (MD. Fuller CanadaStanley Harrison F/U 04/23/18)    Consulted and Agree with Plan of Care  Patient       Patient will benefit from skilled therapeutic  intervention in order to improve the following deficits and impairments:  Decreased activity tolerance, Increased edema, Decreased range of motion, Decreased strength  Visit Diagnosis: Stiffness of left knee, not elsewhere classified  Localized edema  Muscle weakness (generalized)     Problem List Patient Active Problem List   Diagnosis Date Noted  . Status post total knee replacement, 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 02/11/2018  . Iron deficiency anemia 04/28/2017  . Vitamin D deficiency 04/28/2017  . Osteopenia 04/28/2017  . Gastritis, erosive   . Abdominal pain, epigastric 12/12/2016  . Colon cancer screening 11/03/2016  . Essential hypertension 11/03/2016  . Primary osteoarthritis of both knees 11/03/2016  . GERD (gastroesophageal reflux disease) 11/03/2016  . Age-related osteoporosis without current pathological fracture 11/03/2016  . Perforation of sigmoid colon (HCC) 06/29/2016    Zaydn Gutridge P, PTA 04/15/2018, 10:45 AM  [facility] [address] [place], [place], [address] Phone: [phone]   Fax:  [phone]  Name: Anthony Sarva H Klas MRN: [id number] Date of Birth: 03/24/1946

## 2018-04-21 ENCOUNTER — Encounter: Payer: Self-pay | Admitting: Physical Therapy

## 2018-04-21 ENCOUNTER — Ambulatory Visit: Payer: Medicare HMO | Admitting: Physical Therapy

## 2018-04-21 DIAGNOSIS — M25662 Stiffness of left knee, not elsewhere classified: Secondary | ICD-10-CM

## 2018-04-21 DIAGNOSIS — R600 Localized edema: Secondary | ICD-10-CM

## 2018-04-21 DIAGNOSIS — M6281 Muscle weakness (generalized): Secondary | ICD-10-CM | POA: Diagnosis not present

## 2018-04-21 NOTE — Therapy (Signed)
[facility] [address] [place], [place], [address] Phone: [phone]   Fax:  [phone]  Physical Therapy Treatment  Patient Details  Name: Tiffany Bradley MRN: [id number] Date of Birth: 05-02-1946 Referring Provider: Fuller Canada MD.   Encounter Date: 04/21/2018  PT End of Session - 04/21/18 1002    Visit Number  13    Number of Visits  16    Date for PT Re-Evaluation  06/06/18    Authorization Type  10TH VISIT PROGRESS NOTE AND KX MODIFIER AFTER THE 15 VISIT.        PT Start Time  [phone]    PT Stop Time  1035    PT Time Calculation (min)  49 min    Activity Tolerance  Patient tolerated treatment well    Behavior During Therapy  WFL for tasks assessed/performed       Past Medical History:  Diagnosis Date  . Arthritis   . Back pain   . Cataract   . DJD (degenerative joint disease)   . Full dentures   . GERD (gastroesophageal reflux disease)   . Glaucoma   . History of bleeding ulcers    patient reports multiple hospitalizations remotely  . Hypertension   . Wears glasses     Past Surgical History:  Procedure Laterality Date  . BREAST REDUCTION SURGERY Bilateral 04/25/2013   Procedure: MAMMARY REDUCTION  (BREAST);  Surgeon: Louisa Second, MD;  Location: [facility];  Service: Plastics;  Laterality: Bilateral;  . COLONOSCOPY     2008, normal per patient  . COLONOSCOPY N/A 01/16/2017   Procedure: COLONOSCOPY;  Surgeon: West Bali, MD;  [facility];  Service: Endoscopy;  Laterality: N/A;  830   . COSMETIC SURGERY    . ESOPHAGOGASTRODUODENOSCOPY N/A 01/16/2017   Procedure: ESOPHAGOGASTRODUODENOSCOPY (EGD);  Surgeon: West Bali, MD;  [facility];  Service: Endoscopy;  Laterality: N/A;  . PARTIAL COLECTOMY N/A 06/29/2016   Procedure: PARTIAL COLECTOMY;  Surgeon: Ancil Linsey, MD;  Location: [facility];  Service: General;  Laterality: N/A;  . TOTAL KNEE ARTHROPLASTY Left  02/11/2018   Procedure: TOTAL KNEE ARTHROPLASTY;  Surgeon: Vickki Hearing, MD;  Location: [facility];  Service: Orthopedics;  Laterality: Left;  DePuy   . TUBAL LIGATION      There were no vitals filed for this visit.  Subjective Assessment - 04/21/18 0952    Subjective  No pain but limited with stairs at her home. Reports that she was ambulating stairs with step to pattern long before TKR due to pain.    Pertinent History  OP; DJD;     Limitations  Standing    How long can you stand comfortably?  15-20 minutes or longer    How long can you walk comfortably?  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    Patient Stated Goals  Get to walking better without pain.    Currently in Pain?  No/denies         [facility] PT Assessment - 04/21/18 0001      Assessment   Medical Diagnosis  S/p left total knee replacement.    Onset Date/Surgical Date  02/11/18    Next MD Visit  04/23/2018      Restrictions   Weight Bearing Restrictions  No      ROM / Strength   AROM / PROM / Strength  AROM;PROM      AROM   Overall AROM   Deficits    AROM Assessment Site  Knee    Right/Left Knee  Left    Left Knee Flexion  103      PROM   Overall PROM   Deficits    PROM Assessment Site  Knee    Right/Left Knee  Left    Left Knee Flexion  106                   [facility] Adult PT Treatment/Exercise - 04/21/18 0001      Ambulation/Gait   Stairs  Yes    Stairs Assistance  6: Modified independent (Device/Increase time)    Stair Management Technique  One rail Right;Step to pattern;Forwards    Number of Stairs  4   x3 RT   Height of Stairs  6.5      Exercises   Exercises  Knee/Hip      Knee/Hip Exercises: Aerobic   Nustep  L5 x15 min seat 9-8      Knee/Hip Exercises: Machines for Strengthening   Cybex Knee Extension  10# x20 reps    Cybex Knee Flexion  40# x20 reps    Cybex Leg Press  1 pl, seat 5 x20 reps      Modalities    Modalities  Vasopneumatic      Vasopneumatic   Number Minutes Vasopneumatic   15 minutes    Vasopnuematic Location   Knee    Vasopneumatic Pressure  Low    Vasopneumatic Temperature   34      Manual Therapy   Manual Therapy  Passive ROM;Soft tissue mobilization    Soft tissue mobilization  L knee incision mobilizations     Passive ROM  PROM of L knee into flexion with holds at end range               PT Short Term Goals - 03/09/18 1426      PT SHORT TERM GOAL #1   Title  STG's= LTG's.        PT Long Term Goals - 04/08/18 1005      PT LONG TERM GOAL #1   Title  Independent with a HEP.    Time  8    Period  Weeks    Status  On-going      PT LONG TERM GOAL #2   Title  Active left knee flexion to 115 degrees+ so the patient can perform functional tasks and do so with pain not > 2-3/10.    Time  8    Period  Weeks    Status  On-going   AROM 93 degrees 04/08/18     PT LONG TERM GOAL #3   Title  Perform ADL's with pain not > 3/10.    Time  8    Period  Weeks    Status  On-going      PT LONG TERM GOAL #4   Title  Increase left knee strength to a solid 4+/5 to provide good stability for accomplishment of functional activities.    Time  8    Period  Weeks    Status  On-going      PT LONG TERM GOAL #5   Title  Perform a reciprocating stair gait with one railing with pain not > 2-3/10.    Time  8    Period  Weeks    Status  On-going  Plan - 04/21/18 1029    Clinical Impression Statement  Patient continues to show improvement with AROM L knee flexion today and was progressed with strengthening exercises. Patient limited somewhat with stairs as step to pattern has been engrained as habit. Patient instructed with alternating pattern but patient unable to descending secondary to habit but able to complete ascending reciprically fairly well. Patient unable to tolerate prolonged PROM session due to discomfort but improvement with AROM L knee flexion as  103 deg. Edema present from L knee to L ankle today which was observed during ambulating in clinic. Normal vasopnuematic response noted following removal of the modality.     Rehab Potential  Excellent    PT Frequency  2x / week    PT Duration  8 weeks    PT Treatment/Interventions  ADLs/Self Care Home Management;Electrical Stimulation;Cryotherapy;Gait training;Stair training;Functional mobility training;Therapeutic activities;Therapeutic exercise;Patient/family education;Neuromuscular re-education;Manual techniques;Passive range of motion;Vasopneumatic Device    PT Next Visit Plan  MD note required for visit on 04/22/2018    Consulted and Agree with Plan of Care  Patient       Patient will benefit from skilled therapeutic intervention in order to improve the following deficits and impairments:  Decreased activity tolerance, Increased edema, Decreased range of motion, Decreased strength  Visit Diagnosis: Stiffness of left knee, not elsewhere classified  Localized edema  Muscle weakness (generalized)     Problem List Patient Active Problem List   Diagnosis Date Noted  . Status post total knee replacement, 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 02/11/2018  . Iron deficiency anemia 04/28/2017  . Vitamin D deficiency 04/28/2017  . Osteopenia 04/28/2017  . Gastritis, erosive   . Abdominal pain, epigastric 12/12/2016  . Colon cancer screening 11/03/2016  . Essential hypertension 11/03/2016  . Primary osteoarthritis of both knees 11/03/2016  . GERD (gastroesophageal reflux disease) 11/03/2016  . Age-related osteoporosis without current pathological fracture 11/03/2016  . Perfor[facility] of sigmoid colon [facility](H[facility]) 06/29/2016    Marvell FullerKelsey P Woodruff Skirvin, PTA 04/21/2018, 10:49 AM  [facility] [address] [place], [place], [address] Phone: [phone]   Fax:  [phone]  Name: Tiffany Bradley MRN: [id number] Date of Birth: 05/06/1946

## 2018-04-22 ENCOUNTER — Encounter: Payer: Self-pay | Admitting: Physical Therapy

## 2018-04-22 ENCOUNTER — Ambulatory Visit: Payer: Medicare HMO | Admitting: Physical Therapy

## 2018-04-22 DIAGNOSIS — M25662 Stiffness of left knee, not elsewhere classified: Secondary | ICD-10-CM | POA: Diagnosis not present

## 2018-04-22 DIAGNOSIS — R600 Localized edema: Secondary | ICD-10-CM

## 2018-04-22 DIAGNOSIS — M6281 Muscle weakness (generalized): Secondary | ICD-10-CM | POA: Diagnosis not present

## 2018-04-22 NOTE — Therapy (Signed)
[facility] [address] [place], [place], [address] Phone: [phone]   Fax:  [phone]  Physical Therapy Treatment  Patient Details  Name: Tiffany Bradley MRN: [id number] Date of Birth: 09/30/1945 Referring Provider: Fuller CanadaStanley Harrison MD.   Encounter Date: 04/22/2018  PT End of Session - 04/22/18 1108    Visit Number  14    Number of Visits  16    Date for PT Re-Evaluation  06/06/18    PT Start Time  0948    PT Stop Time  1043    PT Time Calculation (min)  55 min    Activity Tolerance  Patient tolerated treatment well    Behavior During Therapy  [facility] for tasks assessed/performed       Past Medical History:  Diagnosis Date  . Arthritis   . Back pain   . Cataract   . DJD (degenerative joint disease)   . Full dentures   . GERD (gastroesophageal reflux disease)   . Glaucoma   . History of bleeding ulcers    patient reports multiple hospitalizations remotely  . Hypertension   . Wears glasses     Past Surgical History:  Procedure Laterality Date  . BREAST REDUCTION SURGERY Bilateral 04/25/2013   Procedure: MAMMARY REDUCTION  (BREAST);  Surgeon: Louisa SecondGerald Truesdale, MD;  Location: [facility];  Service: Plastics;  Laterality: Bilateral;  . COLONOSCOPY     2008, normal per patient  . COLONOSCOPY N/A 01/16/2017   Procedure: COLONOSCOPY;  Surgeon: West BaliFields, Sandi L, MD;  [facility];  Service: Endoscopy;  Laterality: N/A;  830   . COSMETIC SURGERY    . ESOPHAGOGASTRODUODENOSCOPY N/A 01/16/2017   Procedure: ESOPHAGOGASTRODUODENOSCOPY (EGD);  Surgeon: West BaliFields, Sandi L, MD;  [facility];  Service: Endoscopy;  Laterality: N/A;  . PARTIAL COLECTOMY N/A 06/29/2016   Procedure: PARTIAL COLECTOMY;  Surgeon: Ancil LinseyJason Evan Davis, MD;  Location: [facility];  Service: General;  Laterality: N/A;  . 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   Procedure: TOTAL KNEE ARTHROPLASTY;  Surgeon: Vickki HearingHarrison, Stanley E, MD;  Location:  [facility];  Service: Orthopedics;  Laterality: Left;  DePuy   . TUBAL LIGATION      There were no vitals filed for this visit.  Subjective Assessment - 04/22/18 1112    Subjective  I'm doing good.      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    Patient Stated Goals  Get to walking better without pain.    Currently in Pain?  Yes    Pain Score  1     Pain Location  Knee    Pain Orientation  Left    Pain Descriptors / Indicators  Tightness;Sore    Pain Onset  More than a month ago         [facility] - 04/22/18 0001      PROM   Left Knee Flexion  103 degrees.                   [facility] Treatment/Exercise - 04/22/18 0001      Exercises   Exercises  Knee/Hip      Lumbar Exercises: Aerobic   Nustep  Level 5 x 16 minutes.      Knee/Hip Exercises: Machines for Strengthening   Cybex Knee Extension  10# x 3 minutes.    Cybex Knee Flexion  40# x 2 minutes.      Knee/Hip  Exercises: Standing   Other Standing Knee Exercises  Rockerboard in parallel bars x 4 minutes.      Vasopneumatic   Number Minutes Vasopneumatic   15 minutes    Vasopnuematic Location   --   Left knee.   Vasopneumatic Pressure  Medium               PT Short Term Goals - 03/09/18 1426      PT SHORT TERM GOAL #1   Title  STG's= LTG's.        PT Long Term Goals - 04/08/18 1005      PT LONG TERM GOAL #1   Title  Independent with a HEP.    Time  8    Period  Weeks    Status  On-going      PT LONG TERM GOAL #2   Title  Active left knee flexion to 115 degrees+ so the patient can perform functional tasks and do so with pain not > 2-3/10.    Time  8    Period  Weeks    Status  On-going   AROM 93 degrees 04/08/18     PT LONG TERM GOAL #3   Title  Perform ADL's with pain not > 3/10.    Time  8    Period  Weeks    Status  On-going      PT LONG TERM GOAL #4   Title  Increase left knee strength to a solid 4+/5 to provide good stability  for accomplishment of functional activities.    Time  8    Period  Weeks    Status  On-going      PT LONG TERM GOAL #5   Title  Perform a reciprocating stair gait with one railing with pain not > 2-3/10.    Time  8    Period  Weeks    Status  On-going            Plan - 04/22/18 1114    Clinical Impression Statement  Patient did great today.  Her left knee passive range of motion today was:  0 to 103 degrees.      PT Treatment/Interventions  ADLs/Self Care Home Management;Electrical Stimulation;Cryotherapy;Gait training;Stair training;Functional mobility training;Therapeutic activities;Therapeutic exercise;Patient/family education;Neuromuscular re-education;Manual techniques;Passive range of motion;Vasopneumatic Device    PT Next Visit Plan  MD note required for visit on 04/22/2018    Consulted and Agree with Plan of Care  Patient       Patient will benefit from skilled therapeutic intervention in order to improve the following deficits and impairments:  Decreased activity tolerance, Increased edema, Decreased range of motion, Decreased strength  Visit Diagnosis: Stiffness of left knee, not elsewhere classified  Localized edema     Problem List Patient Active Problem List   Diagnosis Date Noted  . Status post total knee replacement, 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 02/11/2018  . Iron deficiency anemia 04/28/2017  . Vitamin D deficiency 04/28/2017  . Osteopenia 04/28/2017  . Gastritis, erosive   . Abdominal pain, epigastric 12/12/2016  . Colon cancer screening 11/03/2016  . Essential hypertension 11/03/2016  . Primary osteoarthritis of both knees 11/03/2016  . GERD (gastroesophageal reflux disease) 11/03/2016  . Age-related osteoporosis without current pathological fracture 11/03/2016  . Perforation of sigmoid colon (HCC) 06/29/2016    Jearldean Gutt, [place] MPT 04/22/2018, 11:16 AM  [facility] [address] [place], [place], [address] Phone:  [phone]   Fax:  [phone]  Name: Tiffany Bradley  MRN: [id number] Date of Birth: 01-19-1946

## 2018-04-23 ENCOUNTER — Encounter: Payer: Self-pay | Admitting: Orthopedic Surgery

## 2018-04-23 ENCOUNTER — Ambulatory Visit (INDEPENDENT_AMBULATORY_CARE_PROVIDER_SITE_OTHER): Payer: Medicare HMO | Admitting: Orthopedic Surgery

## 2018-04-23 VITALS — BP 144/85 | HR 90 | Ht 63.0 in | Wt 169.0 lb

## 2018-04-23 DIAGNOSIS — Z96652 Presence of left artificial knee joint: Secondary | ICD-10-CM

## 2018-04-23 MED ORDER — TRAMADOL HCL 50 MG PO TABS: 50.0000 mg | ORAL_TABLET | ORAL | 0 refills | Status: DC | PRN

## 2018-04-23 MED ORDER — GABAPENTIN 300 MG PO CAPS: 300.0000 mg | ORAL_CAPSULE | Freq: Two times a day (BID) | ORAL | 0 refills | Status: DC

## 2018-04-23 NOTE — Progress Notes (Signed)
Postop appointment status post left total knee February 11, 2018 patient is made significant progress in her range of motion her pain is decreased she does want a prescription for gabapentin and tramadol which she will take when she goes to therapy which she has not completed  She still using her cane her knee looks great she has excellent extension her flexion is now past 90 degrees I will see her in 4 months

## 2018-04-27 ENCOUNTER — Ambulatory Visit: Payer: Medicare HMO | Admitting: Physical Therapy

## 2018-04-27 ENCOUNTER — Encounter: Payer: Self-pay | Admitting: Physical Therapy

## 2018-04-27 DIAGNOSIS — M6281 Muscle weakness (generalized): Secondary | ICD-10-CM

## 2018-04-27 DIAGNOSIS — M25662 Stiffness of left knee, not elsewhere classified: Secondary | ICD-10-CM | POA: Diagnosis not present

## 2018-04-27 DIAGNOSIS — R600 Localized edema: Secondary | ICD-10-CM

## 2018-04-27 NOTE — Therapy (Signed)
[facility] [address] [place], [place], [address] Phone: [phone]   Fax:  [phone]  Physical Therapy Treatment  Patient Details  Name: CECE MILHOUSE MRN: [id number] Date of Birth: [birth year] Referring Provider: Fuller Canada MD.   Encounter Date: 04/27/2018  PT End of Session - 04/27/18 0956    Visit Number  15    Number of Visits  16    Date for PT Re-Evaluation  06/06/18    Authorization Type  10TH VISIT PROGRESS NOTE AND KX MODIFIER AFTER THE 15 VISIT.        PT Start Time  0945    PT Stop Time  1040    PT Time Calculation (min)  55 min    Activity Tolerance  Patient tolerated treatment well    Behavior During Therapy  WFL for tasks assessed/performed       Past Medical History:  Diagnosis Date  . Arthritis   . Back pain   . Cataract   . DJD (degenerative joint disease)   . Full dentures   . GERD (gastroesophageal reflux disease)   . Glaucoma   . History of bleeding ulcers    patient reports multiple hospitalizations remotely  . Hypertension   . Wears glasses     Past Surgical History:  Procedure Laterality Date  . BREAST REDUCTION SURGERY Bilateral 04/25/2013   Procedure: MAMMARY REDUCTION  (BREAST);  Surgeon: Louisa Second, MD;  Location: [facility];  Service: Plastics;  Laterality: Bilateral;  . COLONOSCOPY     2008, normal per patient  . COLONOSCOPY N/A 01/16/2017   Procedure: COLONOSCOPY;  Surgeon: West Bali, MD;  [facility];  Service: Endoscopy;  Laterality: N/A;  830   . COSMETIC SURGERY    . ESOPHAGOGASTRODUODENOSCOPY N/A 01/16/2017   Procedure: ESOPHAGOGASTRODUODENOSCOPY (EGD);  Surgeon: West Bali, MD;  [facility];  Service: Endoscopy;  Laterality: N/A;  . PARTIAL COLECTOMY N/A 06/29/2016   Procedure: PARTIAL COLECTOMY;  Surgeon: Ancil Linsey, MD;  Location[facility];  Service: General;  Laterality: N/A;  . TOTAL KNEE ARTHROPLASTY Left  02/11/2018   Procedure: TOTAL KNEE ARTHROPLASTY;  Surgeon: Vickki Hearing, MD;  Location: [facility];  Service: Orthopedics;  Laterality: Left;  DePuy   . TUBAL LIGATION      There were no vitals filed for this visit.  Subjective Assessment - 04/27/18 0955    Subjective  Patient reported feeling good, no pain.    Pertinent History  OP; DJD;     Limitations  Standing    How long can you stand comfortably?  15-20 minutes or longer    How long can you walk comfortably?  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    Patient Stated Goals  Get to walking better without pain.    Currently in Pain?  No/denies         [facility] PT Assessment - 04/27/18 0001      Assessment   Medical Diagnosis  S/p left total knee replacement.                   [facility] Adult PT Treatment/Exercise - 04/27/18 0001      Exercises   Exercises  Knee/Hip      Lumbar Exercises: Aerobic   Nustep  Level 5 x 17 minutes.      Knee/Hip Exercises: Machines for Strengthening  Cybex Knee Extension  10# x 3 minutes.    Cybex Knee Flexion  40# x 2 minutes.      Vasopneumatic   Number Minutes Vasopneumatic   15 minutes    Vasopnuematic Location   Knee    Vasopneumatic Pressure  Medium    Vasopneumatic Temperature   34      Manual Therapy   Manual Therapy  Passive ROM;Soft tissue mobilization    Passive ROM  PROM of L knee into flexion with holds at end range               PT Short Term Goals - 03/09/18 1426      PT SHORT TERM GOAL #1   Title  STG's= LTG's.        PT Long Term Goals - 04/08/18 1005      PT LONG TERM GOAL #1   Title  Independent with a HEP.    Time  8    Period  Weeks    Status  On-going      PT LONG TERM GOAL #2   Title  Active left knee flexion to 115 degrees+ so the patient can perform functional tasks and do so with pain not > 2-3/10.    Time  8    Period  Weeks    Status  On-going   AROM 93 degrees  04/08/18     PT LONG TERM GOAL #3   Title  Perform ADL's with pain not > 3/10.    Time  8    Period  Weeks    Status  On-going      PT LONG TERM GOAL #4   Title  Increase left knee strength to a solid 4+/5 to provide good stability for accomplishment of functional activities.    Time  8    Period  Weeks    Status  On-going      PT LONG TERM GOAL #5   Title  Perform a reciprocating stair gait with one railing with pain not > 2-3/10.    Time  8    Period  Weeks    Status  On-going            Plan - 04/27/18 1027    Clinical Impression Statement  Patient was able to tolerate treatment well. Patient expressed how every weekend and every morning her knee gets stiff. Patient educated stiffness is normal post surgery and reiterated importance of HEP to prevent stiffness and to improve strength. Patient also educated exercise for TKA is going to be ongoing to improve and maintain gains from PT. Patient reported understanding. Normal response after vasopneumatic device.     Clinical Presentation  Stable    Clinical Decision Making  Low    Rehab Potential  Excellent    PT Frequency  2x / week    PT Duration  8 weeks    PT Treatment/Interventions  ADLs/Self Care Home Management;Electrical Stimulation;Cryotherapy;Gait training;Stair training;Functional mobility training;Therapeutic activities;Therapeutic exercise;Patient/family education;Neuromuscular re-education;Manual techniques;Passive range of motion;Vasopneumatic Device    PT Next Visit Plan  Assess goals next visit; Continue strengthening and PROM flexion, vaso for pain relief.    Consulted and Agree with Plan of Care  Patient       Patient will benefit from skilled therapeutic intervention in order to improve the following deficits and impairments:  Decreased activity tolerance, Increased edema, Decreased range of motion, Decreased strength  Visit Diagnosis: Stiffness of left knee, not elsewhere classified  Localized  edema  Muscle weakness (generalized)     Problem List Patient Active Problem List   Diagnosis Date Noted  . Status post total knee replacement, 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 02/11/2018  . Iron deficiency anemia 04/28/2017  . Vitamin D deficiency 04/28/2017  . Osteopenia 04/28/2017  . Gastritis, erosive   . Abdominal pain, epigastric 12/12/2016  . Colon cancer screening 11/03/2016  . Essential hypertension 11/03/2016  . Primary osteoarthritis of both knees 11/03/2016  . GERD (gastroesophageal reflux disease) 11/03/2016  . Age-related osteoporosis without current pathological fracture 11/03/2016  . Perforation of sigmoid colon (HCC) 06/29/2016   Guss Bunde, PT, DPT 04/27/2018, 10:46 AM  [facility] [address] [place], [place], [address] Phone: [phone]   Fax:  [phone]  Name: TAISIA FANTINI MRN: [id number] Date of Birth: 1945-09-10

## 2018-04-29 ENCOUNTER — Ambulatory Visit: Payer: Medicare HMO | Admitting: Physical Therapy

## 2018-04-29 ENCOUNTER — Encounter: Payer: Self-pay | Admitting: Physical Therapy

## 2018-04-29 DIAGNOSIS — R600 Localized edema: Secondary | ICD-10-CM | POA: Diagnosis not present

## 2018-04-29 DIAGNOSIS — M25662 Stiffness of left knee, not elsewhere classified: Secondary | ICD-10-CM

## 2018-04-29 DIAGNOSIS — M6281 Muscle weakness (generalized): Secondary | ICD-10-CM

## 2018-04-29 NOTE — Therapy (Signed)
[facility] [place], [place], [address] Phone: [phone]   Fax:  [phone]  Physical Therapy Treatment/Discharge  Patient Details  Name: Tiffany Bradley MRN: [id number] Date of Birth: 1945-12-05 Referring Provider: Arther Abbott MD.   Encounter Date: 04/29/2018  PT End of Session - 04/29/18 0946    Visit Number  16    Number of Visits  16    Date for PT Re-Evaluation  06/06/18    Authorization Type  10TH VISIT PROGRESS NOTE AND KX MODIFIER AFTER THE 15 VISIT.        PT Start Time  0945    PT Stop Time  1038    PT Time Calculation (min)  53 min    Activity Tolerance  Patient tolerated treatment well    Behavior During Therapy  WFL for tasks assessed/performed       Past Medical History:  Diagnosis Date  . Arthritis   . Back pain   . Cataract   . DJD (degenerative joint disease)   . Full dentures   . GERD (gastroesophageal reflux disease)   . Glaucoma   . History of bleeding ulcers    patient reports multiple hospitalizations remotely  . Hypertension   . Wears glasses     Past Surgical History:  Procedure Laterality Date  . BREAST REDUCTION SURGERY Bilateral 04/25/2013   Procedure: MAMMARY REDUCTION  (BREAST);  Surgeon: Cristine Polio, MD;  Location: [place];  Service: Plastics;  Laterality: Bilateral;  . COLONOSCOPY     2008, normal per patient  . COLONOSCOPY N/A 01/16/2017   Procedure: COLONOSCOPY;  Surgeon: Danie Binder, MD;  [facility];  Service: Endoscopy;  Laterality: N/A;  830   . COSMETIC SURGERY    . ESOPHAGOGASTRODUODENOSCOPY N/A 01/16/2017   Procedure: ESOPHAGOGASTRODUODENOSCOPY (EGD);  Surgeon: Danie Binder, MD;  [facility];  Service: Endoscopy;  Laterality: N/A;  . PARTIAL COLECTOMY N/A 06/29/2016   Procedure: PARTIAL COLECTOMY;  Surgeon: Vickie Epley, MD;  Location: [facility];  Service: General;  Laterality: N/A;  . TOTAL KNEE ARTHROPLASTY  Left 02/11/2018   Procedure: TOTAL KNEE ARTHROPLASTY;  Surgeon: Carole Civil, MD;  Location: [facility];  Service: Orthopedics;  Laterality: Left;  DePuy   . TUBAL LIGATION      There were no vitals filed for this visit.  Subjective Assessment - 04/29/18 1029    Subjective  Patient reported feeling good and was able to weed eat yesterday with no pain    Pertinent History  OP; DJD;     Limitations  Standing    How long can you stand comfortably?  15-20 minutes or longer    How long can you walk comfortably?  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    Patient Stated Goals  Get to walking better without pain.    Currently in Pain?  No/denies                       [facility] Adult PT Treatment/Exercise - 04/29/18 0001      Ambulation/Gait   Stairs  Yes    Stairs Assistance  6: Modified independent (Device/Increase time)    Stair Management Technique  One rail Right;Forwards;Alternating pattern;Step to pattern    Number of Stairs  4    Height of Stairs  6.5    Gait Comments  Patient can negotiate with reciprocating up but step to going down      Exercises   Exercises  Knee/Hip      Lumbar Exercises: Aerobic   Nustep  Level 5 x 15 minutes.      Knee/Hip Exercises: Stretches   Knee: Self-Stretch to increase Flexion  Both;Other (comment)   5" hold x10     Knee/Hip Exercises: Machines for Strengthening   Cybex Knee Extension  10# x2 minutes    Cybex Knee Flexion  40# x 30    Cybex Leg Press  1 pl, seat 5 x20      Vasopneumatic   Number Minutes Vasopneumatic   15 minutes    Vasopnuematic Location   Knee    Vasopneumatic Pressure  Medium    Vasopneumatic Temperature   34               PT Short Term Goals - 03/09/18 1426      PT SHORT TERM GOAL #1   Title  STG's= LTG's.        PT Long Term Goals - 04/29/18 1018      PT LONG TERM GOAL #1   Title  Independent with a HEP.    Time  8     Period  Weeks    Status  Achieved      PT LONG TERM GOAL #2   Title  Active left knee flexion to 115 degrees+ so the patient can perform functional tasks and do so with pain not > 2-3/10.    Time  8    Period  Weeks    Status  Not Met   103     PT LONG TERM GOAL #3   Title  Perform ADL's with pain not > 3/10.    Time  8    Period  Weeks    Status  Achieved      PT LONG TERM GOAL #4   Title  Increase left knee strength to a solid 4+/5 to provide good stability for accomplishment of functional activities.    Period  Weeks    Status  Achieved      PT LONG TERM GOAL #5   Title  Perform a reciprocating stair gait with one railing with pain not > 2-3/10.    Time  8    Period  Weeks    Status  Partially Met   reciprocal up, step to down           Plan - 04/29/18 1029    Clinical Impression Statement  Patient was able to tolerate treatment well. Patient expressed she would like to be set up machines at the [facility]. Patient was able to set up knee flexion and knee extension machine with guidance. Patient continues to demonstrate step to gait pattern with descending steps but reports it is mainly secondary to habit. Patient eduacted importance of daily HEP to maintain gains of therapy. Patient reported understanding. Patient's goals partially met. Normal response to modalities upon removal.     Clinical Presentation  Stable    Rehab Potential  Excellent    PT Frequency  2x / week    PT Duration  8 weeks    PT Treatment/Interventions  ADLs/Self Care Home Management;Electrical Stimulation;Cryotherapy;Gait training;Stair training;Functional mobility training;Therapeutic activities;Therapeutic exercise;Patient/family education;Neuromuscular re-education;Manual techniques;Passive range of motion;Vasopneumatic Device    PT Next Visit Plan  DC    Consulted and Agree with Plan of Care  Patient  Patient will benefit from skilled therapeutic intervention in order to improve the following  deficits and impairments:  Decreased activity tolerance, Increased edema, Decreased range of motion, Decreased strength  Visit Diagnosis: Stiffness of left knee, not elsewhere classified  Muscle weakness (generalized)  Localized edema     Problem List Patient Active Problem List   Diagnosis Date Noted  . Status post total knee replacement, 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 02/11/2018  . Iron deficiency anemia 04/28/2017  . Vitamin D deficiency 04/28/2017  . Osteopenia 04/28/2017  . Gastritis, erosive   . Abdominal pain, epigastric 12/12/2016  . Colon cancer screening 11/03/2016  . Essential hypertension 11/03/2016  . Primary osteoarthritis of both knees 11/03/2016  . GERD (gastroesophageal reflux disease) 11/03/2016  . Age-related osteoporosis without current pathological fracture 11/03/2016  . Perforation of sigmoid colon ([place]) 06/29/2016   PHYSICAL THERAPY DISCHARGE SUMMARY  Visits from Start of Care: 16  Current functional level related to goals / functional outcomes: See above   Remaining deficits: See goals   Education / Equipment: HEP Plan: Patient agrees to discharge.  Patient goals were partially met. Patient is being discharged due to being pleased with the current functional level.  ?????       Gabriela Eves, PT, DPT 04/29/2018, 11:23 AM  [facility] [address] [place], [place], [address] Phone: [phone]   Fax:  [phone]  Name: Tiffany Bradley MRN: [id number] Date of Birth: [birth year]

## 2018-05-04 ENCOUNTER — Encounter: Payer: Self-pay | Admitting: Physician Assistant

## 2018-05-04 ENCOUNTER — Ambulatory Visit (INDEPENDENT_AMBULATORY_CARE_PROVIDER_SITE_OTHER): Payer: Medicare HMO | Admitting: Physician Assistant

## 2018-05-04 VITALS — BP 118/74 | HR 87 | Temp 99.6°F | Ht 63.0 in | Wt 170.4 lb

## 2018-05-04 DIAGNOSIS — Z0000 Encounter for general adult medical examination without abnormal findings: Secondary | ICD-10-CM

## 2018-05-04 DIAGNOSIS — K219 Gastro-esophageal reflux disease without esophagitis: Secondary | ICD-10-CM

## 2018-05-04 DIAGNOSIS — G5603 Carpal tunnel syndrome, bilateral upper limbs: Secondary | ICD-10-CM | POA: Diagnosis not present

## 2018-05-04 DIAGNOSIS — Z96652 Presence of left artificial knee joint: Secondary | ICD-10-CM

## 2018-05-04 MED ORDER — GABAPENTIN 300 MG PO CAPS: 300.0000 mg | ORAL_CAPSULE | Freq: Two times a day (BID) | ORAL | 11 refills | Status: DC

## 2018-05-04 NOTE — Patient Instructions (Signed)

## 2018-05-04 NOTE — Progress Notes (Signed)
BP 118/74   Pulse 87   Temp 99.6 F (37.6 C) (Oral)   Ht _0  (1.6 m)   Wt 170 lb 6.4 oz (77.3 kg)   BMI 30.19 kg/m    Subjective:    Patient ID: Tiffany Bradley, female    DOB: 03/14/1946, 72 y.o.   MRN: [id number]  HPI: Tiffany Bradley is a 72 y.o. female presenting on 05/04/2018 for Hypertension (6 month follow up )  This patient comes in for periodic recheck on medications and conditions including GERD, carpal tunnel syndrome, status post knee replacement June 2019.  She does not go back and see Dr. Aline Brochure until December.  She has gone through all of her physical therapy sessions and is still doing home exercises.  She stated that she got to greater than 110 degrees and flexibility.  She is feeling quite good about her knee.  She states that however at times her carpal tunnel is flaring up and she has some numbness mostly in the morning when she first gets up.  She is not wearing braces or stretching out her hands..   All medications are reviewed today. There are no reports of any problems with the medications. All of the medical conditions are reviewed and updated.  Lab work is reviewed and will be ordered as medically necessary. There are no new problems reported with today's visit.   Past Medical History:  Diagnosis Date  . Arthritis   . Back pain   . Cataract   . DJD (degenerative joint disease)   . Full dentures   . GERD (gastroesophageal reflux disease)   . Glaucoma   . History of bleeding ulcers    patient reports multiple hospitalizations remotely  . Hypertension   . Wears glasses    Relevant past medical, surgical, family and social history reviewed and updated as indicated. Interim medical history since our last visit reviewed. Allergies and medications reviewed and updated. DATA REVIEWED: CHART IN EPIC  Family History reviewed for pertinent findings.  Review of Systems  Constitutional: Negative.  Negative for activity change, fatigue and fever.  HENT: Negative.     Eyes: Negative.   Respiratory: Negative.  Negative for cough.   Cardiovascular: Negative.  Negative for chest pain.  Gastrointestinal: Negative.  Negative for abdominal pain.  Endocrine: Negative.   Genitourinary: Negative.  Negative for dysuria.  Musculoskeletal: Positive for arthralgias, back pain and myalgias.  Skin: Negative.   Neurological: Positive for weakness.    Allergies as of 05/04/2018   No Known Allergies     Medication List        Accurate as of 05/04/18  2:05 PM. Always use your most recent med list.          amLODipine 10 MG tablet Commonly known as:  NORVASC Take 1 tablet (10 mg total) by mouth daily.   aspirin 325 MG EC tablet Take 1 tablet (325 mg total) by mouth daily with breakfast.   CALCIUM 600+D3 PO Take 1 tablet by mouth daily.   docusate sodium 100 MG capsule Commonly known as:  COLACE Take 1 capsule (100 mg total) by mouth 2 (two) times daily.   gabapentin 300 MG capsule Commonly known as:  NEURONTIN Take 1 capsule (300 mg total) by mouth 2 (two) times daily.   GLUCOSAMINE-MSM PO Take 2 tablets by mouth daily.   MULTI-VITAMIN GUMMIES PO Take 2 each by mouth daily.   omeprazole 40 MG capsule Commonly known as:  PRILOSEC Take  1 capsule (40 mg total) by mouth daily.   sucralfate 1 g tablet Commonly known as:  CARAFATE Take 1 g by mouth 4 (four) times daily as needed (for upset stomach).   tiZANidine 4 MG tablet Commonly known as:  ZANAFLEX Take 1 tablet (4 mg total) by mouth at bedtime as needed for muscle spasms.   traMADol 50 MG tablet Commonly known as:  ULTRAM Take 1 tablet (50 mg total) by mouth every 4 (four) hours as needed.          Objective:    BP 118/74   Pulse 87   Temp 99.6 F (37.6 C) (Oral)   Ht _0  (1.6 m)   Wt 170 lb 6.4 oz (77.3 kg)   BMI 30.19 kg/m   No Known Allergies  Wt Readings from Last 3 Encounters:  05/04/18 170 lb 6.4 oz (77.3 kg)  04/23/18 169 lb (76.7 kg)  03/19/18 169 lb (76.7 kg)     Physical Exam  Constitutional: She is oriented to person, place, and time. She appears well-developed and well-nourished.  HENT:  Head: Normocephalic and atraumatic.  Eyes: Pupils are equal, round, and reactive to light. Conjunctivae and EOM are normal.  Cardiovascular: Normal rate, regular rhythm, normal heart sounds and intact distal pulses.  Pulmonary/Chest: Effort normal and breath sounds normal.  Abdominal: Soft. Bowel sounds are normal.  Musculoskeletal:       Right knee: She exhibits normal range of motion and no deformity.       Left knee: She exhibits normal range of motion and no ecchymosis.       Lumbar back: She exhibits decreased range of motion and tenderness.  Neurological: She is alert and oriented to person, place, and time. She has normal reflexes.  Skin: Skin is warm and dry. No rash noted.  Psychiatric: She has a normal mood and affect. Her behavior is normal. Judgment and thought content normal.        Assessment & Plan:   1. 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  Dispense: 60 capsule; Refill: 11  2. Bilateral carpal tunnel syndrome Wear brace  3. Gastroesophageal reflux disease without esophagitis Continue meds  4. Well adult exam - CBC with Differential/Platelet; Future - Lipid panel; Future - TSH; Future - CMP14+EGFR; Future   Continue all other maintenance medications as listed above.  Follow up plan: Return in about 6 months (around 11/02/2018) for recheck.  Educational handout given for [place] PA-C [facility] [address]  [place], [place] [address] [phone]   05/04/2018, 2:05 PM

## 2018-05-12 ENCOUNTER — Telehealth: Payer: Self-pay | Admitting: *Deleted

## 2018-05-12 NOTE — Telephone Encounter (Signed)
Summary of Benefits for Prolia received. Patient would owe 20% and the prior authorization has been obtained and is on file. Should be buy and bill through our office because the cost at the pharmacy was undisclosed.   Left message on patient's voicemail with this information and that she may qualify for a [employer]. I will mail her information on this grant and fax the diagnosis statement to the foundation.   She will need to contact our office if she wants to start Prolia.

## 2018-05-17 NOTE — Telephone Encounter (Signed)
Calling about pt verification form please call back

## 2018-05-19 ENCOUNTER — Telehealth: Payer: Self-pay | Admitting: Physician Assistant

## 2018-05-19 NOTE — Telephone Encounter (Signed)
Left message that I will be leaving at 2:00 today but I will be here all day tomorrow. She can call back or swing by with the paperwork that I need to fax to the [facility].

## 2018-05-20 ENCOUNTER — Other Ambulatory Visit: Payer: Medicare HMO

## 2018-05-20 DIAGNOSIS — Z0000 Encounter for general adult medical examination without abnormal findings: Secondary | ICD-10-CM | POA: Diagnosis not present

## 2018-05-20 DIAGNOSIS — I10 Essential (primary) hypertension: Secondary | ICD-10-CM | POA: Diagnosis not present

## 2018-05-20 DIAGNOSIS — Z136 Encounter for screening for cardiovascular disorders: Secondary | ICD-10-CM | POA: Diagnosis not present

## 2018-05-21 LAB — CMP14+EGFR
A/G RATIO: 1.7 (ref 1.2–2.2)
ALBUMIN: 4.5 g/dL (ref 3.5–4.8)
ALT: 11 IU/L (ref 0–32)
AST: 22 IU/L (ref 0–40)
Alkaline Phosphatase: 118 IU/L — ABNORMAL HIGH (ref 39–117)
BUN / CREAT RATIO: 16 (ref 12–28)
BUN: 10 mg/dL (ref 8–27)
Bilirubin Total: 0.5 mg/dL (ref 0.0–1.2)
CO2: 26 mmol/L (ref 20–29)
Calcium: 9.6 mg/dL (ref 8.7–10.3)
Chloride: 102 mmol/L (ref 96–106)
Creatinine, Ser: 0.61 mg/dL (ref 0.57–1.00)
GFR calc non Af Amer: 92 mL/min/{1.73_m2} (ref >59)
GFR, EST AFRICAN AMERICAN: 105 mL/min/{1.73_m2} (ref >59)
GLOBULIN, TOTAL: 2.7 g/dL (ref 1.5–4.5)
Glucose: 92 mg/dL (ref 65–99)
POTASSIUM: 4.6 mmol/L (ref 3.5–5.2)
SODIUM: 144 mmol/L (ref 134–144)
TOTAL PROTEIN: 7.2 g/dL (ref 6.0–8.5)

## 2018-05-21 LAB — LIPID PANEL
CHOL/HDL RATIO: 2.4 ratio (ref 0.0–4.4)
Cholesterol, Total: 158 mg/dL (ref 100–199)
HDL: 66 mg/dL (ref >39)
LDL CALC: 83 mg/dL (ref 0–99)
TRIGLYCERIDES: 43 mg/dL (ref 0–149)
VLDL Cholesterol Cal: 9 mg/dL (ref 5–40)

## 2018-05-21 LAB — CBC WITH DIFFERENTIAL/PLATELET
BASOS ABS: 0.1 10*3/uL (ref 0.0–0.2)
Basos: 2 %
EOS (ABSOLUTE): 0.1 10*3/uL (ref 0.0–0.4)
Eos: 3 %
Hematocrit: 34.6 % (ref 34.0–46.6)
Hemoglobin: 10.4 g/dL — ABNORMAL LOW (ref 11.1–15.9)
Immature Grans (Abs): 0 10*3/uL (ref 0.0–0.1)
Immature Granulocytes: 0 %
LYMPHS: 23 %
Lymphocytes Absolute: 0.8 10*3/uL (ref 0.7–3.1)
MCH: 23.2 pg — AB (ref 26.6–33.0)
MCHC: 30.1 g/dL — ABNORMAL LOW (ref 31.5–35.7)
MCV: 77 fL — ABNORMAL LOW (ref 79–97)
Monocytes Absolute: 0.3 10*3/uL (ref 0.1–0.9)
Monocytes: 7 %
NEUTROS ABS: 2.3 10*3/uL (ref 1.4–7.0)
NEUTROS PCT: 65 %
PLATELETS: 384 10*3/uL (ref 150–450)
RBC: 4.48 x10E6/uL (ref 3.77–5.28)
RDW: 18.1 % — ABNORMAL HIGH (ref 12.3–15.4)
WBC: 3.5 10*3/uL (ref 3.4–10.8)

## 2018-05-21 LAB — TSH: TSH: 1.35 u[IU]/mL (ref 0.450–4.500)

## 2018-05-26 NOTE — Telephone Encounter (Signed)
[employer] approved for $1000 a year.   ID: [id number] Pharmacy Card ID: [id number] PC Group: [id number] PC BIN: [id number] PC PCN: [id number] PC Processor: PDMI Fund: post menopausal osteoporosis Medicare Access  Effective: 04/24/18 to 04/24/2019

## 2018-05-26 NOTE — Telephone Encounter (Signed)
Summary of benefits did not provide a coverage amount for a specialty pharmacy. Resubmitted to see if they are able to come up with an amount. Since she has the [facility] it will be easier for her to use a pharmacy and for them to process the card at the time of purchase. If we buy and bill she will have to pay for Prolia and submit for reimbursement. Waiting to see coverage level. I expect it to be the same at 20% patient responsibility.

## 2018-06-01 MED ORDER — DENOSUMAB 60 MG/ML ~~LOC~~ SOSY: PREFILLED_SYRINGE | SUBCUTANEOUS | 0 refills | Status: DC

## 2018-06-01 NOTE — Telephone Encounter (Signed)
Patient scheduled for injection next week. Prolia ordered at [employer] and [employer] information provided on script.

## 2018-06-03 ENCOUNTER — Other Ambulatory Visit: Payer: Self-pay | Admitting: Physician Assistant

## 2018-06-03 DIAGNOSIS — K219 Gastro-esophageal reflux disease without esophagitis: Secondary | ICD-10-CM

## 2018-06-03 DIAGNOSIS — K279 Peptic ulcer, site unspecified, unspecified as acute or chronic, without hemorrhage or perforation: Secondary | ICD-10-CM

## 2018-06-10 ENCOUNTER — Ambulatory Visit (INDEPENDENT_AMBULATORY_CARE_PROVIDER_SITE_OTHER): Payer: Medicare HMO | Admitting: *Deleted

## 2018-06-10 DIAGNOSIS — M810 Age-related osteoporosis without current pathological fracture: Secondary | ICD-10-CM | POA: Diagnosis not present

## 2018-06-10 MED ORDER — DENOSUMAB 60 MG/ML ~~LOC~~ SOSY
60.0000 mg | PREFILLED_SYRINGE | Freq: Once | SUBCUTANEOUS | Status: AC
  Administered 2018-06-10: 60 mg via SUBCUTANEOUS

## 2018-06-10 NOTE — Progress Notes (Signed)
Pt given Prolia inj Tolerated well This inj was pt supplied

## 2018-06-15 ENCOUNTER — Other Ambulatory Visit: Payer: Self-pay | Admitting: Orthopedic Surgery

## 2018-06-15 DIAGNOSIS — Z96652 Presence of left artificial knee joint: Secondary | ICD-10-CM

## 2018-07-15 ENCOUNTER — Telehealth: Payer: Self-pay | Admitting: Physician Assistant

## 2018-08-10 ENCOUNTER — Encounter: Payer: Self-pay | Admitting: Orthopedic Surgery

## 2018-08-18 ENCOUNTER — Ambulatory Visit: Payer: Medicare HMO | Admitting: Orthopedic Surgery

## 2018-09-22 ENCOUNTER — Encounter: Payer: Self-pay | Admitting: Orthopedic Surgery

## 2018-09-22 ENCOUNTER — Ambulatory Visit: Payer: Medicare HMO | Admitting: Orthopedic Surgery

## 2018-09-22 VITALS — BP 127/84 | HR 75 | Ht 63.0 in | Wt 174.0 lb

## 2018-09-22 DIAGNOSIS — Z96652 Presence of left artificial knee joint: Secondary | ICD-10-CM

## 2018-09-22 MED ORDER — TRAMADOL HCL 50 MG PO TABS: 50.0000 mg | ORAL_TABLET | ORAL | 0 refills | Status: DC | PRN

## 2018-09-22 NOTE — Progress Notes (Signed)
Chief Complaint  Patient presents with  . Routine Post Op    Lt knee DOS 02/11/18    Left knee replacement in June patient says she is doing much better this is her 24-month follow-up she says she has some swelling in her left leg  Review of systems just the swelling  BP 127/84   Pulse 75   Ht 5\' 3"  (1.6 m)   Wt 174 lb (78.9 kg)   BMI 30.82 kg/m  Her knee incision looks great she has some edema in the left leg none on the right but she has bilateral skin pigment changing suggesting venous stasis disease her knee flexion is 120 degrees she has full extension she is walking without any support her knee feels stable muscle strength and tone are normal  Encounter Diagnosis  Name Primary?  . Status post total knee replacement, 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 Yes    Meds ordered this encounter  Medications  . traMADol (ULTRAM) 50 MG tablet    Sig: Take 1 tablet (50 mg total) by mouth every 4 (four) hours as needed.    Dispense:  90 tablet    Refill:  0    Follow-up in 6 months for the 1 year x-ray

## 2018-10-18 ENCOUNTER — Ambulatory Visit (INDEPENDENT_AMBULATORY_CARE_PROVIDER_SITE_OTHER): Payer: Medicare HMO | Admitting: *Deleted

## 2018-10-18 ENCOUNTER — Encounter: Payer: Self-pay | Admitting: *Deleted

## 2018-10-18 VITALS — BP 120/80 | HR 70 | Ht 63.0 in | Wt 169.0 lb

## 2018-10-18 DIAGNOSIS — Z0000 Encounter for general adult medical examination without abnormal findings: Secondary | ICD-10-CM | POA: Diagnosis not present

## 2018-10-18 NOTE — Patient Instructions (Signed)
Please work on your goal of exercising 3 times per week for 30 minutes each session.   Please review the information given on Advance Directives.  If you complete the paperwork, please bring a copy to our office to be filed in your medical record.   Please consider getting your Prevnar 13 (pneumonia) vaccine, Shingrix (shingles), and Tdap (tetanus, diptheria, and pertussis) vaccines.   Please follow up with Particia Nearing, PA as scheduled.   Please continue to move carefully to avoid falls.  Thank you for coming in for your Annual Wellness Visit today!!  Preventive Care 72 Years and Older, Female Preventive care refers to lifestyle choices and visits with your health care provider that can promote health and wellness. What does preventive care include?  A yearly physical exam. This is also called an annual well check.  Dental exams once or twice a year.  Routine eye exams. Ask your health care provider how often you should have your eyes checked.  Personal lifestyle choices, including: ? Daily care of your teeth and gums. ? Regular physical activity. ? Eating a healthy diet. ? Avoiding tobacco and drug use. ? Limiting alcohol use. ? Practicing safe sex. ? Taking low-dose aspirin every day. ? Taking vitamin and mineral supplements as recommended by your health care provider. What happens during an annual well check? The services and screenings done by your health care provider during your annual well check will depend on your age, overall health, lifestyle risk factors, and family history of disease. Counseling Your health care provider may ask you questions about your:  Alcohol use.  Tobacco use.  Drug use.  Emotional well-being.  Home and relationship well-being.  Sexual activity.  Eating habits.  History of falls.  Memory and ability to understand (cognition).  Work and work Statistician.  Reproductive health.  Screening You may have the following tests or  measurements:  Height, weight, and BMI.  Blood pressure.  Lipid and cholesterol levels. These may be checked every 5 years, or more frequently if you are over 48 years old.  Skin check.  Lung cancer screening. You may have this screening every year starting at age 34 if you have a 30-pack-year history of smoking and currently smoke or have quit within the past 15 years.  Colorectal cancer screening. All adults should have this screening starting at age 9 and continuing until age 66. You will have tests every 1-10 years, depending on your results and the type of screening test. People at increased risk should start screening at an earlier age. Screening tests may include: ? Guaiac-based fecal occult blood testing. ? Fecal immunochemical test (FIT). ? Stool DNA test. ? Virtual colonoscopy. ? Sigmoidoscopy. During this test, a flexible tube with a tiny camera (sigmoidoscope) is used to examine your rectum and lower colon. The sigmoidoscope is inserted through your anus into your rectum and lower colon. ? Colonoscopy. During this test, a long, thin, flexible tube with a tiny camera (colonoscope) is used to examine your entire colon and rectum.  Hepatitis C blood test.  Hepatitis B blood test.  Sexually transmitted disease (STD) testing.  Diabetes screening. This is done by checking your blood sugar (glucose) after you have not eaten for a while (fasting). You may have this done every 1-3 years.  Bone density scan. This is done to screen for osteoporosis. You may have this done starting at age 48.  Mammogram. This may be done every 1-2 years. Talk to your health care provider about  how often you should have regular mammograms. Talk with your health care provider about your test results, treatment options, and if necessary, the need for more tests. Vaccines Your health care provider may recommend certain vaccines, such as:  Influenza vaccine. This is recommended every year.  Tetanus,  diphtheria, and acellular pertussis (Tdap, Td) vaccine. You may need a Td booster every 10 years.  Varicella vaccine. You may need this if you have not been vaccinated.  Zoster vaccine. You may need this after age 64.  Measles, mumps, and rubella (MMR) vaccine. You may need at least one dose of MMR if you were born in 1957 or later. You may also need a second dose.  Pneumococcal 13-valent conjugate (PCV13) vaccine. One dose is recommended after age 34.  Pneumococcal polysaccharide (PPSV23) vaccine. One dose is recommended after age 62.  Meningococcal vaccine. You may need this if you have certain conditions.  Hepatitis A vaccine. You may need this if you have certain conditions or if you travel or work in places where you may be exposed to hepatitis A.  Hepatitis B vaccine. You may need this if you have certain conditions or if you travel or work in places where you may be exposed to hepatitis B.  Haemophilus influenzae type b (Hib) vaccine. You may need this if you have certain conditions. Talk to your health care provider about which screenings and vaccines you need and how often you need them. This information is not intended to replace advice given to you by your health care provider. Make sure you discuss any questions you have with your health care provider. Document Released: 09/14/2015 Document Revised: 10/08/2017 Document Reviewed: 06/19/2015 Elsevier Interactive Patient Education  2019 [employer].

## 2018-10-18 NOTE — Progress Notes (Signed)
Subjective:   CALLISTA HOH is a 73 y.o. female who presents for a subsequent Medicare Annual Wellness Visit.  Ms. Mcbryar is retired from [employer].  She enjoys being active in her church, including participating in the choir, making crafts, cooking, and spending time with her family.  She has 3 children , and 5 grandchildren, one of her grandchildren is decreased. Ms. Mordan states that her 41 and 37 year old granddaughters live with her part of the time each week while their parents work.  She enjoys them staying with her, and states they keep her active.   Patient Care Team: Caryl Never as PCP - General (Physician Assistant) West Bali, MD as Consulting Physician (Gastroenterology) Vickki Hearing, MD as Consulting Physician (Orthopedic Surgery)  Hospitalizations, surgeries, and ER visits in previous 12 months No ER visits in the past year.  Has had one surgery with hospitalization in the past year - left total knee replacement.  Review of Systems    Patient reports that her overall health is better compared to last year.  Knee pain is improved since having left knee replacement  Cardiac Risk Factors include: advanced age (>26men, >55 women);hypertension  All other systems negative       Current Medications (verified) Outpatient Encounter Medications as of 10/18/2018  Medication Sig  . amLODipine (NORVASC) 10 MG tablet Take 1 tablet (10 mg total) by mouth daily.  Marland Kitchen aspirin EC 325 MG EC tablet Take 1 tablet (325 mg total) by mouth daily with breakfast.  . Calcium Carb-Cholecalciferol (CALCIUM 600+D3 PO) Take 1 tablet by mouth daily.  Marland Kitchen denosumab (PROLIA) 60 MG/ML SOSY injection Bring to your doctor to inject 60mg  subcutaneously once.  . diclofenac (VOLTAREN) 75 MG EC tablet Take 75 mg by mouth 2 (two) times daily.  Marland Kitchen docusate sodium (COLACE) 100 MG capsule Take 1 capsule (100 mg total) by mouth 2 (two) times daily. (Patient taking differently: Take 100 mg by mouth  daily as needed. )  . ferrous sulfate 325 (65 FE) MG tablet Take 325 mg by mouth daily.  Marland Kitchen gabapentin (NEURONTIN) 300 MG capsule Take 1 capsule (300 mg total) by mouth 2 (two) times daily.  . Glucosamine HCl-MSM (GLUCOSAMINE-MSM PO) Take 2 tablets by mouth daily.  Marland Kitchen omeprazole (PRILOSEC) 40 MG capsule Take 1 capsule (40 mg total) by mouth daily.  . sucralfate (CARAFATE) 1 g tablet Take 1 g by mouth 4 (four) times daily as needed (for upset stomach).   . sucralfate (CARAFATE) 1 g tablet TAKE 1 TABLET BY MOUTH 4 TIMES DAILY WITH MEALS AND AT BEDTIME  . tiZANidine (ZANAFLEX) 4 MG tablet Take 1 tablet (4 mg total) by mouth at bedtime as needed for muscle spasms. (Patient taking differently: Take 4 mg by mouth at bedtime. )  . traMADol (ULTRAM) 50 MG tablet Take 1 tablet (50 mg total) by mouth every 4 (four) hours as needed.  . Multiple Vitamins-Minerals (MULTI-VITAMIN GUMMIES PO) Take 2 each by mouth daily.    No facility-administered encounter medications on file as of 10/18/2018.     Allergies (verified) Patient has no known allergies.   History: Past Medical History:  Diagnosis Date  . Arthritis   . Back pain   . Cataract   . DJD (degenerative joint disease)   . Full dentures   . GERD (gastroesophageal reflux disease)   . Glaucoma   . History of bleeding ulcers    patient reports multiple hospitalizations remotely  .  Hypertension   . Wears glasses    Past Surgical History:  Procedure Laterality Date  . BREAST REDUCTION SURGERY Bilateral 04/25/2013   Procedure: MAMMARY REDUCTION  (BREAST);  Surgeon: Louisa SecondGerald Truesdale, MD;  Location: [facility];  Service: Plastics;  Laterality: Bilateral;  . COLONOSCOPY     2008, normal per patient  . COLONOSCOPY N/A 01/16/2017   Procedure: COLONOSCOPY;  Surgeon: West BaliFields, Sandi L, MD;  Location: [facility];  Service: Endoscopy;  Laterality: N/A;  830   . COSMETIC SURGERY    . ESOPHAGOGASTRODUODENOSCOPY N/A 01/16/2017   Procedure:  ESOPHAGOGASTRODUODENOSCOPY (EGD);  Surgeon: West BaliFields, Sandi L, MD;  Location: [facility];  Service: Endoscopy;  Laterality: N/A;  . PARTIAL COLECTOMY N/A 06/29/2016   Procedure: PARTIAL COLECTOMY;  Surgeon: Ancil LinseyJason Evan Davis, MD;  Location: [facility];  Service: General;  Laterality: N/A;  . 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 TUBAL LIGATION     Family History  Problem Relation Age of Onset  . Kidney disease Mother   . Stroke Mother   . Heart attack Father   . Hyperlipidemia Sister   . Hypertension Sister   . Diabetes Sister   . Hypertension Brother   . Hyperlipidemia Brother   . Hypertension Sister   . Hyperlipidemia Sister   . Colon cancer Neg Hx    Social History   Socioeconomic History  . Marital status: Divorced    Spouse name: Not on file  . Number of children: 3  . Years of education: 7511  . Highest education level: Not on file  Occupational History  . Occupation: Retired    Associate Professormployer: [employer]  Social Needs  . Financial resource strain: Not hard at all  . Food insecurity:    Worry: Never true    Inability: Never true  . Transportation needs:    Medical: No    Non-medical: No  Tobacco Use  . Smoking status: Former Smoker    Packs/day: 3.00    Types: Cigarettes    Last attempt to quit: 04/20/1993    Years since quitting: 25.5  . Smokeless tobacco: Never Used  Substance and Sexual Activity  . Alcohol use: Not Currently    Frequency: Never  . Drug use: No  . Sexual activity: Not Currently  Lifestyle  . Physical activity:    Days per week: 0 days    Minutes per session: 0 min  . Stress: Not at all  Relationships  . Social connections:    Talks on phone: More than three times a week    Gets together: More than three times a week    Attends religious service: More than 4 times per year    Active member of club  or organization: Yes    Attends meetings of clubs or organizations: More than 4 times per year    Relationship status: Divorced  Other Topics Concern  . Not on file  Social History Narrative  . Not on file     Clinical Intake:     Pain Score: 0-No pain                  Activities of Daily Living In your present state of health, do you have any difficulty performing the following activities: 10/18/2018 02/11/2018  Hearing? N N  Vision? N N  Difficulty concentrating  or making decisions? N N  Walking or climbing stairs? N Y  Dressing or bathing? N N  Doing errands, shopping? N N  Preparing Food and eating ? N -  Using the Toilet? N -  In the past six months, have you accidently leaked urine? Y -  Comment Urinary frequency -  Do you have problems with loss of bowel control? N -  Managing your Medications? N -  Managing your Finances? N -  Housekeeping or managing your Housekeeping? N -  Some recent data might be hidden     Exercise Current Exercise Habits: The patient does not participate in regular exercise at present, Exercise limited by: orthopedic condition(s)  Diet Consumes 3 meals a day and 1 snacks a day.  The patient feels that she mostly follow a Regular diet.  Diet History  Ms. Tumbarello states she usually has oatmeal for breakfast, peanut butter and crackers for lunch, and a meat and vegetables for supper.  She states she has access to all the food she needs.  Recommended at diet of mostly vegetables, fruits, whole grains, and lean proteins.   Depression Screen PHQ 2/9 Scores 10/18/2018 05/04/2018 11/02/2017 10/15/2017 04/28/2017 11/19/2016 10/29/2016  PHQ - 2 Score 0 0 0 0 0 0 0     Fall Risk Fall Risk  10/18/2018 05/04/2018 11/02/2017 10/15/2017 04/28/2017  Falls in the past year? 0 No No No No     Objective:    Today's Vitals   10/18/18 1119  BP: 120/80  Pulse: 70  Weight: 169 lb (76.7 kg)  Height: 5\' 3"  (1.6 m)  PainSc: 0-No pain   Body mass index  is 29.94 kg/m.  Advanced Directives 10/18/2018 02/11/2018 02/11/2018 02/08/2018 01/05/2018 10/15/2017 01/16/2017  Does Patient Have a Medical Advance Directive? No No No No No No No  Would patient like information on creating a medical advance directive? Yes (MAU/Ambulatory/Procedural Areas - Information given) No - Patient declined No - Patient declined No - Patient declined - Yes (MAU/Ambulatory/Procedural Areas - Information given) No - Patient declined  Pre-existing out of facility DNR order (yellow form or pink MOST form) - - - - - - -    Hearing/Vision  No hearing or vision deficits noted during visit.  Cognitive Function: MMSE - Mini Mental State Exam 10/18/2018  Orientation to time 5  Orientation to Place 5  Registration 3  Attention/ Calculation 5  Recall 2  Language- name 2 objects 2  Language- repeat 1  Language- follow 3 step command 3  Language- read & follow direction 1  Write a sentence 1  Copy design 1  Total score 29           Immunizations and Health Maintenance  There is no immunization history on file for this patient. Health Maintenance Due  Topic Date Due  . Hepatitis C Screening  1945-11-08   Health Maintenance  Topic Date Due  . Hepatitis C Screening  1945/11/16  . TETANUS/TDAP  11/03/2018 (Originally 02/07/1965)  . PNA vac Low Risk Adult (1 of 2 - PCV13) 11/03/2018 (Originally 02/08/2012)  . MAMMOGRAM  11/24/2019  . COLONOSCOPY  01/17/2027  . DEXA SCAN  Completed  . INFLUENZA VACCINE  Discontinued   Recommend Hepatitis C screening at next office visit with Prudy Feeler, PA on 11/02/2018.  Recommend Tdap, Prevnar 13, and Shingrix vaccines in the future.  Mammogram scheduled for 12/17/2018.      Assessment:   This is a routine wellness examination for Astraea.  Plan:    Goals    . Exercise 3x per week (30 min per time)     Exercising at the [facility] is a great option.         Health Maintenance & Additional Screening Recommendations  Pneumococcal  vaccine  Influenza vaccine Td vaccine Screening mammography Advanced directives: has NO advanced directive  - add't info requested. Referral to SW: no  Lung: Low Dose CT Chest recommended if Age 13-80 years, 30 pack-year currently smoking OR have quit w/in 15years. Patient does not qualify. Hepatitis C Screening recommended: Yes, recommend at next visit with Prudy Feeler, PA    Keep f/u with Remus Loffler, PA-C and any other specialty appointments you may have Continue current medications Move carefully to avoid falls.  Aim for at least 150 minutes of moderate activity a week. Read or work on puzzles daily Stay connected with friends and family   I have personally reviewed and noted the following in the patient's chart:   . Medical and social history . Use of alcohol, tobacco or illicit drugs  . Current medications and supplements . Functional ability and status . Nutritional status . Physical activity . Advanced directives . List of other physicians . Hospitalizations, surgeries, and ER visits in previous 12 months . Vitals . Screenings to include cognitive, depression, and falls . Referrals and appointments  In addition, I have reviewed and discussed with patient certain preventive protocols, quality metrics, and best practice recommendations. A written personalized care plan for preventive services as well as general preventive health recommendations were provided to patient.     Lilia Argue, RN  10/18/2018

## 2018-11-02 ENCOUNTER — Encounter: Payer: Self-pay | Admitting: Physician Assistant

## 2018-11-02 ENCOUNTER — Ambulatory Visit (INDEPENDENT_AMBULATORY_CARE_PROVIDER_SITE_OTHER): Payer: Medicare HMO | Admitting: Physician Assistant

## 2018-11-02 VITALS — BP 118/78 | HR 83 | Ht 63.0 in | Wt 174.0 lb

## 2018-11-02 DIAGNOSIS — K279 Peptic ulcer, site unspecified, unspecified as acute or chronic, without hemorrhage or perforation: Secondary | ICD-10-CM

## 2018-11-02 DIAGNOSIS — D509 Iron deficiency anemia, unspecified: Secondary | ICD-10-CM

## 2018-11-02 DIAGNOSIS — N3281 Overactive bladder: Secondary | ICD-10-CM

## 2018-11-02 DIAGNOSIS — I10 Essential (primary) hypertension: Secondary | ICD-10-CM | POA: Diagnosis not present

## 2018-11-02 DIAGNOSIS — K219 Gastro-esophageal reflux disease without esophagitis: Secondary | ICD-10-CM

## 2018-11-02 MED ORDER — TOLTERODINE TARTRATE ER 2 MG PO CP24: 2.0000 mg | ORAL_CAPSULE | Freq: Every day | ORAL | 5 refills | Status: DC

## 2018-11-02 MED ORDER — AMLODIPINE BESYLATE 10 MG PO TABS: 10.0000 mg | ORAL_TABLET | Freq: Every day | ORAL | 11 refills | Status: DC

## 2018-11-02 MED ORDER — OMEPRAZOLE 40 MG PO CPDR: 40.0000 mg | DELAYED_RELEASE_CAPSULE | Freq: Every day | ORAL | 11 refills | Status: DC

## 2018-11-02 MED ORDER — SUCRALFATE 1 G PO TABS: 2.0000 g | ORAL_TABLET | Freq: Three times a day (TID) | ORAL | 1 refills | Status: DC

## 2018-11-02 NOTE — Patient Instructions (Signed)

## 2018-11-02 NOTE — Progress Notes (Signed)
BP 118/78   Pulse 83   Ht _0  (1.6 m)   Wt 174 lb (78.9 kg)   BMI 30.82 kg/m    Subjective:    Patient ID: Tiffany Bradley, female    DOB: 25-Jun-1946, 73 y.o.   MRN: [id number]  HPI: Tiffany Bradley is a 73 y.o. female presenting on 11/02/2018 for Hypertension (6 month follow up)  This patient comes in for periodic recheck on medications and conditions including essential hypertension, GERD, peptic ulcer disease, history of iron deficiency anemia.  She has been doing fairly well with her anemia will have checked today while she is here.  She is doing well with her other medications.  She is having more more problems with overactive bladder action.  She is urinating 3 or more times at night.  And it is large amounts.  She does not take much caffeine..   All medications are reviewed today. There are no reports of any problems with the medications. All of the medical conditions are reviewed and updated.  Lab work is reviewed and will be ordered as medically necessary. There are no new problems reported with today's visit.'  Past Medical History:  Diagnosis Date  . Arthritis   . Back pain   . Cataract   . DJD (degenerative joint disease)   . Full dentures   . GERD (gastroesophageal reflux disease)   . Glaucoma   . History of bleeding ulcers    patient reports multiple hospitalizations remotely  . Hypertension   . Wears glasses    Relevant past medical, surgical, family and social history reviewed and updated as indicated. Interim medical history since our last visit reviewed. Allergies and medications reviewed and updated. DATA REVIEWED: CHART IN EPIC  Family History reviewed for pertinent findings.  Review of Systems  Constitutional: Negative.  Negative for activity change, fatigue and fever.  HENT: Negative.   Eyes: Negative.   Respiratory: Negative.  Negative for cough.   Cardiovascular: Negative.  Negative for chest pain.  Gastrointestinal: Negative.  Negative for abdominal pain.   Endocrine: Negative.   Genitourinary: Positive for frequency and urgency. Negative for dysuria.  Musculoskeletal: Positive for arthralgias.  Skin: Negative.   Neurological: Negative.     Allergies as of 11/02/2018   No Known Allergies     Medication List       Accurate as of November 02, 2018 10:16 PM. Always use your most recent med list.        amLODipine 10 MG tablet Commonly known as:  NORVASC Take 1 tablet (10 mg total) by mouth daily.   aspirin 325 MG EC tablet Take 1 tablet (325 mg total) by mouth daily with breakfast.   CALCIUM 600+D3 PO Take 1 tablet by mouth daily.   denosumab 60 MG/ML Sosy injection Commonly known as:  PROLIA Bring to your doctor to inject 28m subcutaneously once.   diclofenac 75 MG EC tablet Commonly known as:  VOLTAREN Take 75 mg by mouth 2 (two) times daily.   docusate sodium 100 MG capsule Commonly known as:  COLACE Take 1 capsule (100 mg total) by mouth 2 (two) times daily.   ferrous sulfate 325 (65 FE) MG tablet Take 325 mg by mouth daily.   gabapentin 300 MG capsule Commonly known as:  NEURONTIN Take 1 capsule (300 mg total) by mouth 2 (two) times daily.   GLUCOSAMINE-MSM PO Take 2 tablets by mouth daily.   MULTI-VITAMIN GUMMIES PO Take 2 each by mouth  daily.   omeprazole 40 MG capsule Commonly known as:  PRILOSEC Take 1 capsule (40 mg total) by mouth daily.   sucralfate 1 g tablet Commonly known as:  CARAFATE Take 2 tablets (2 g total) by mouth 4 (four) times daily -  with meals and at bedtime.   tiZANidine 4 MG tablet Commonly known as:  ZANAFLEX Take 1 tablet (4 mg total) by mouth at bedtime as needed for muscle spasms.   tolterodine 2 MG 24 hr capsule Commonly known as:  DETROL LA Take 1 capsule (2 mg total) by mouth daily.   traMADol 50 MG tablet Commonly known as:  ULTRAM Take 1 tablet (50 mg total) by mouth every 4 (four) hours as needed.          Objective:    BP 118/78   Pulse 83   Ht _0  (1.6  m)   Wt 174 lb (78.9 kg)   BMI 30.82 kg/m   No Known Allergies  Wt Readings from Last 3 Encounters:  11/02/18 174 lb (78.9 kg)  10/18/18 169 lb (76.7 kg)  09/22/18 174 lb (78.9 kg)    Physical Exam Constitutional:      Appearance: She is well-developed.  HENT:     Head: Normocephalic and atraumatic.  Eyes:     Conjunctiva/sclera: Conjunctivae normal.     Pupils: Pupils are equal, round, and reactive to light.  Cardiovascular:     Rate and Rhythm: Normal rate and regular rhythm.     Heart sounds: Normal heart sounds.  Pulmonary:     Effort: Pulmonary effort is normal.     Breath sounds: Normal breath sounds.  Abdominal:     General: Bowel sounds are normal.     Palpations: Abdomen is soft.  Skin:    General: Skin is warm and dry.     Findings: No rash.  Neurological:     Mental Status: She is alert and oriented to person, place, and time.     Deep Tendon Reflexes: Reflexes are normal and symmetric.  Psychiatric:        Behavior: Behavior normal.        Thought Content: Thought content normal.        Judgment: Judgment normal.     Results for orders placed or performed in visit on 05/20/18  CMP14+EGFR  Result Value Ref Range   Glucose 92 65 - 99 mg/dL   BUN 10 8 - 27 mg/dL   Creatinine, Ser 0.61 0.57 - 1.00 mg/dL   GFR calc non Af Amer 92 >59 mL/min/1.73   GFR calc Af Amer 105 >59 mL/min/1.73   BUN/Creatinine Ratio 16 12 - 28   Sodium 144 134 - 144 mmol/L   Potassium 4.6 3.5 - 5.2 mmol/L   Chloride 102 96 - 106 mmol/L   CO2 26 20 - 29 mmol/L   Calcium 9.6 8.7 - 10.3 mg/dL   Total Protein 7.2 6.0 - 8.5 g/dL   Albumin 4.5 3.5 - 4.8 g/dL   Globulin, Total 2.7 1.5 - 4.5 g/dL   Albumin/Globulin Ratio 1.7 1.2 - 2.2   Bilirubin Total 0.5 0.0 - 1.2 mg/dL   Alkaline Phosphatase 118 (H) 39 - 117 IU/L   AST 22 0 - 40 IU/L   ALT 11 0 - 32 IU/L  TSH  Result Value Ref Range   TSH 1.350 0.450 - 4.500 uIU/mL  Lipid panel  Result Value Ref Range   Cholesterol, Total  158 100 - 199 mg/dL  Triglycerides 43 0 - 149 mg/dL   HDL 66 >39 mg/dL   VLDL Cholesterol Cal 9 5 - 40 mg/dL   LDL Calculated 83 0 - 99 mg/dL   Chol/HDL Ratio 2.4 0.0 - 4.4 ratio  CBC with Differential/Platelet  Result Value Ref Range   WBC 3.5 3.4 - 10.8 x10E3/uL   RBC 4.48 3.77 - 5.28 x10E6/uL   Hemoglobin 10.4 (L) 11.1 - 15.9 g/dL   Hematocrit 34.6 34.0 - 46.6 %   MCV 77 (L) 79 - 97 fL   MCH 23.2 (L) 26.6 - 33.0 pg   MCHC 30.1 (L) 31.5 - 35.7 g/dL   RDW 18.1 (H) 12.3 - 15.4 %   Platelets 384 150 - 450 x10E3/uL   Neutrophils 65 Not Estab. %   Lymphs 23 Not Estab. %   Monocytes 7 Not Estab. %   Eos 3 Not Estab. %   Basos 2 Not Estab. %   Neutrophils Absolute 2.3 1.4 - 7.0 x10E3/uL   Lymphocytes Absolute 0.8 0.7 - 3.1 x10E3/uL   Monocytes Absolute 0.3 0.1 - 0.9 x10E3/uL   EOS (ABSOLUTE) 0.1 0.0 - 0.4 x10E3/uL   Basophils Absolute 0.1 0.0 - 0.2 x10E3/uL   Immature Granulocytes 0 Not Estab. %   Immature Grans (Abs) 0.0 0.0 - 0.1 x10E3/uL      Assessment & Plan:   1. OAB (overactive bladder) - tolterodine (DETROL LA) 2 MG 24 hr capsule; Take 1 capsule (2 mg total) by mouth daily.  Dispense: 30 capsule; Refill: 5  2. Essential hypertension - amLODipine (NORVASC) 10 MG tablet; Take 1 tablet (10 mg total) by mouth daily.  Dispense: 30 tablet; Refill: 11  3. Gastroesophageal reflux disease without esophagitis - sucralfate (CARAFATE) 1 g tablet; Take 2 tablets (2 g total) by mouth 4 (four) times daily -  with meals and at bedtime.  Dispense: 120 tablet; Refill: 1  4. PUD (peptic ulcer disease) - sucralfate (CARAFATE) 1 g tablet; Take 2 tablets (2 g total) by mouth 4 (four) times daily -  with meals and at bedtime.  Dispense: 120 tablet; Refill: 1  5. Iron deficiency anemia, unspecified iron deficiency anemia type - CBC with Differential/Platelet   Continue all other maintenance medications as listed above.  Follow up plan: Return in about 6 months (around 05/05/2019) for  recheck and labs.  Educational handout given for [place] PA-C [place] [address]  [place], [place] [address] [phone]   11/02/2018, 10:16 PM

## 2018-11-03 LAB — CBC WITH DIFFERENTIAL/PLATELET
BASOS ABS: 0.1 10*3/uL (ref 0.0–0.2)
Basos: 2 %
EOS (ABSOLUTE): 0.2 10*3/uL (ref 0.0–0.4)
Eos: 5 %
Hematocrit: 39.1 % (ref 34.0–46.6)
Hemoglobin: 11.9 g/dL (ref 11.1–15.9)
IMMATURE GRANS (ABS): 0 10*3/uL (ref 0.0–0.1)
Immature Granulocytes: 0 %
LYMPHS: 22 %
Lymphocytes Absolute: 0.9 10*3/uL (ref 0.7–3.1)
MCH: 25.8 pg — ABNORMAL LOW (ref 26.6–33.0)
MCHC: 30.4 g/dL — ABNORMAL LOW (ref 31.5–35.7)
MCV: 85 fL (ref 79–97)
Monocytes Absolute: 0.4 10*3/uL (ref 0.1–0.9)
Monocytes: 9 %
NEUTROS ABS: 2.5 10*3/uL (ref 1.4–7.0)
Neutrophils: 62 %
PLATELETS: 286 10*3/uL (ref 150–450)
RBC: 4.62 x10E6/uL (ref 3.77–5.28)
RDW: 15.2 % (ref 11.7–15.4)
WBC: 4.1 10*3/uL (ref 3.4–10.8)

## 2018-11-16 ENCOUNTER — Other Ambulatory Visit: Payer: Self-pay | Admitting: Orthopedic Surgery

## 2018-11-16 DIAGNOSIS — Z96652 Presence of left artificial knee joint: Secondary | ICD-10-CM

## 2018-11-25 ENCOUNTER — Telehealth: Payer: Self-pay | Admitting: *Deleted

## 2018-11-25 ENCOUNTER — Other Ambulatory Visit: Payer: Self-pay | Admitting: *Deleted

## 2018-11-25 MED ORDER — DENOSUMAB 60 MG/ML ~~LOC~~ SOSY: PREFILLED_SYRINGE | SUBCUTANEOUS | 0 refills | Status: DC

## 2018-11-25 NOTE — Telephone Encounter (Signed)
PROLIA: Summary of Benefits Interpretation  November 25, 2018     Purchase Information  Last purchase location: [employer] . Primary: [employer] Medicare Secondary:   Summary of Benefits  . Received on: 11/23/2018 . Estimated Cost to Patient: $0 . Prior authorization required: Yes (If Yes, submit on Cover My Meds or refer to the SOB for online form location) o Auth or Reference #: [id number] o Approval valid from 03/22/2018 to 03/23/2019  . Physician Purchase Covered: n/a . Specialty Pharmacy Covered:n/a o Pharmacy Name: Sent to [facility] because patient qualified for [facility].  See last refill on Prolia for [employer] information.    Tiffany Bradley is scheduled to come in for Prolia injection on 12/13/2018.    Tiffany Bradley, AMY M   11/25/2018 [facility] [phone]

## 2018-12-02 ENCOUNTER — Other Ambulatory Visit: Payer: Self-pay | Admitting: Physician Assistant

## 2018-12-13 ENCOUNTER — Other Ambulatory Visit: Payer: Self-pay

## 2018-12-13 ENCOUNTER — Ambulatory Visit (INDEPENDENT_AMBULATORY_CARE_PROVIDER_SITE_OTHER): Payer: Medicare HMO | Admitting: *Deleted

## 2018-12-13 DIAGNOSIS — M810 Age-related osteoporosis without current pathological fracture: Secondary | ICD-10-CM

## 2018-12-13 MED ORDER — DENOSUMAB 60 MG/ML ~~LOC~~ SOSY
60.0000 mg | PREFILLED_SYRINGE | Freq: Once | SUBCUTANEOUS | Status: AC
  Administered 2018-12-13: 60 mg via SUBCUTANEOUS

## 2018-12-13 NOTE — Progress Notes (Signed)
Patient tolerated Prolia Injection well.   Due for next Prolia Injection after 06/14/2019. Injection partially covered by [employer].  Patient has information at home to send back to apply for grant for her next injection.  She will complete as soon as possible.

## 2018-12-26 ENCOUNTER — Encounter: Payer: Self-pay | Admitting: Orthopedic Surgery

## 2018-12-31 ENCOUNTER — Other Ambulatory Visit: Payer: Self-pay | Admitting: Physician Assistant

## 2019-01-04 ENCOUNTER — Other Ambulatory Visit: Payer: Self-pay | Admitting: Physician Assistant

## 2019-01-04 DIAGNOSIS — M170 Bilateral primary osteoarthritis of knee: Secondary | ICD-10-CM

## 2019-01-19 ENCOUNTER — Encounter: Payer: Self-pay | Admitting: *Deleted

## 2019-01-28 ENCOUNTER — Other Ambulatory Visit: Payer: Self-pay | Admitting: Orthopedic Surgery

## 2019-01-28 DIAGNOSIS — Z96652 Presence of left artificial knee joint: Secondary | ICD-10-CM

## 2019-02-01 ENCOUNTER — Other Ambulatory Visit: Payer: Self-pay | Admitting: Physician Assistant

## 2019-02-03 ENCOUNTER — Other Ambulatory Visit: Payer: Self-pay | Admitting: Physician Assistant

## 2019-02-03 DIAGNOSIS — M170 Bilateral primary osteoarthritis of knee: Secondary | ICD-10-CM

## 2019-02-04 NOTE — Telephone Encounter (Signed)
rx refill Last rx'd 11/09/17 #60 with 11 refills Last OV 11/02/18

## 2019-02-21 ENCOUNTER — Ambulatory Visit (INDEPENDENT_AMBULATORY_CARE_PROVIDER_SITE_OTHER): Payer: Medicare HMO

## 2019-02-21 ENCOUNTER — Ambulatory Visit: Payer: Medicare HMO | Admitting: Orthopedic Surgery

## 2019-02-21 ENCOUNTER — Other Ambulatory Visit: Payer: Self-pay

## 2019-02-21 ENCOUNTER — Encounter: Payer: Self-pay | Admitting: Orthopedic Surgery

## 2019-02-21 VITALS — BP 149/107 | HR 101 | Temp 99.5°F | Ht 63.0 in | Wt 175.0 lb

## 2019-02-21 DIAGNOSIS — Z96652 Presence of left artificial knee joint: Secondary | ICD-10-CM

## 2019-02-21 MED ORDER — TRAMADOL HCL 50 MG PO TABS: 50.0000 mg | ORAL_TABLET | ORAL | 0 refills | Status: DC | PRN

## 2019-02-21 NOTE — Progress Notes (Signed)
ANNUAL FOLLOW UP FOR  left TKA   Chief Complaint  Patient presents with  . Routine Post Op    02/11/18 left total knee      HPI: The patient is here for the annual  follow-up x-ray for knee replacement. The patient is not complaining of pain weakness instability  in the repaired knee. She has stiffness when sitting for a long time   She is taking BC powder but has ulcer history, would like tramadol instead   Review of Systems  Musculoskeletal: Positive for myalgias.    Examination of the left  KNEE  BP (!) 149/107   Pulse (!) 101   Temp 99.5 F (37.5 C)   Ht 5\' 3"  (1.6 m)   Wt 175 lb (79.4 kg)   BMI 31.00 kg/m   General the patient is normally groomed in no distress  Inspection shows : incision healed nicely without erythema, no tenderness no swelling  Range of motion total range of motion is 120  Stability the knee is stable anterior to posterior as well as medial to lateral  Strength quadriceps strength is normal  Skin no erythema around the skin incision  Neuro: normal sensation in the operative leg   Gait: normal expected gait without cane    Medical decision-making section  X-rays ordered, internal imaging shows (see full dictated report) stable implant with no signs of loosening  Diagnosis  Encounter Diagnosis  Name Primary?  . 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 traMADol (ULTRAM) 50 MG tablet    Sig: Take 1 tablet (50 mg total) by mouth every 4 (four) hours as needed.    Dispense:  90 tablet    Refill:  0     Plan follow-up 2 year repeat x-rays

## 2019-05-03 ENCOUNTER — Other Ambulatory Visit: Payer: Self-pay | Admitting: Physician Assistant

## 2019-05-03 DIAGNOSIS — N3281 Overactive bladder: Secondary | ICD-10-CM

## 2019-05-04 ENCOUNTER — Ambulatory Visit (INDEPENDENT_AMBULATORY_CARE_PROVIDER_SITE_OTHER): Payer: Medicare HMO | Admitting: Physician Assistant

## 2019-05-04 DIAGNOSIS — N3281 Overactive bladder: Secondary | ICD-10-CM | POA: Diagnosis not present

## 2019-05-04 DIAGNOSIS — Z96652 Presence of left artificial knee joint: Secondary | ICD-10-CM | POA: Diagnosis not present

## 2019-05-04 DIAGNOSIS — M545 Low back pain, unspecified: Secondary | ICD-10-CM

## 2019-05-04 DIAGNOSIS — M5136 Other intervertebral disc degeneration, lumbar region: Secondary | ICD-10-CM

## 2019-05-04 DIAGNOSIS — K219 Gastro-esophageal reflux disease without esophagitis: Secondary | ICD-10-CM

## 2019-05-04 DIAGNOSIS — K279 Peptic ulcer, site unspecified, unspecified as acute or chronic, without hemorrhage or perforation: Secondary | ICD-10-CM | POA: Diagnosis not present

## 2019-05-04 MED ORDER — SUCRALFATE 1 G PO TABS: 2.0000 g | ORAL_TABLET | Freq: Three times a day (TID) | ORAL | 1 refills | Status: DC

## 2019-05-04 MED ORDER — GABAPENTIN 300 MG PO CAPS: 300.0000 mg | ORAL_CAPSULE | Freq: Two times a day (BID) | ORAL | 11 refills | Status: DC

## 2019-05-04 MED ORDER — PEG 400 MONOSTEARATE POWD: 17.0000 g | Freq: Every day | 5 refills | Status: DC

## 2019-05-04 MED ORDER — TRAMADOL HCL 50 MG PO TABS: 50.0000 mg | ORAL_TABLET | Freq: Four times a day (QID) | ORAL | 2 refills | Status: DC | PRN

## 2019-05-04 MED ORDER — TOLTERODINE TARTRATE ER 2 MG PO CP24: 2.0000 mg | ORAL_CAPSULE | Freq: Every day | ORAL | 3 refills | Status: DC

## 2019-05-06 ENCOUNTER — Ambulatory Visit: Payer: Medicare HMO | Admitting: Physician Assistant

## 2019-05-09 ENCOUNTER — Encounter: Payer: Self-pay | Admitting: Physician Assistant

## 2019-05-09 DIAGNOSIS — K279 Peptic ulcer, site unspecified, unspecified as acute or chronic, without hemorrhage or perforation: Secondary | ICD-10-CM | POA: Insufficient documentation

## 2019-05-09 DIAGNOSIS — M545 Low back pain, unspecified: Secondary | ICD-10-CM | POA: Insufficient documentation

## 2019-05-09 DIAGNOSIS — M5136 Other intervertebral disc degeneration, lumbar region: Secondary | ICD-10-CM | POA: Insufficient documentation

## 2019-05-09 DIAGNOSIS — N3281 Overactive bladder: Secondary | ICD-10-CM | POA: Insufficient documentation

## 2019-05-09 NOTE — Progress Notes (Signed)
Telephone visit  Subjective: HE:NIDPOE pain PCP: Terald Sleeper, PA-C HPI:Tiffany Bradley is a 73 y.o. female calls for telephone consult today. Patient provides verbal consent for consult held via phone.  Patient is identified with 2 separate identifiers.  At this time the entire area is on COVID-19 social distancing and stay home orders are in place.  Patient is of higher risk and therefore we are performing this by a virtual method.  Location of patient: home Location of provider: HOME Others present for call: no   This patient reports she has had increasing lumbar pain for the past few months.  She does have known history of degenerative joint disease in multiple joints.  She also has known osteoporosis. 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  She does feel like the pain goes out from her back and sometimes down her leg.  She denies any weakness or trouble with walking or history of falling.  She states that this is here on a almost continual daily basis.  Sometimes it is worse than others.  She says that she also needs refills on her medications for her GERD, degenerative joint disease, overactive bladder, history of knee replacement.  All of her medications are reviewed and will be refilled.  ROS: Per HPI  No Known Allergies Past Medical History:  Diagnosis Date  . Arthritis   . Back pain   . Cataract   . DJD (degenerative joint disease)   . Full dentures   . GERD (gastroesophageal reflux disease)   . Glaucoma   . History of bleeding ulcers    patient reports multiple hospitalizations remotely  . Hypertension   . Wears glasses     Current Outpatient Medications:  .  amLODipine (NORVASC) 10 MG tablet, Take 1 tablet (10 mg total) by mouth daily., Disp: 30 tablet, Rfl: 11 .  aspirin EC 325 MG EC tablet, Take 1 tablet (325 mg total) by mouth daily with breakfast., Disp: 30  tablet, Rfl: 0 .  Calcium Carb-Cholecalciferol (CALCIUM 600+D3 PO), Take 1 tablet by mouth daily., Disp: , Rfl:  .  denosumab (PROLIA) 60 MG/ML SOSY injection, Bring to your doctor to inject 60mg  subcutaneously once., Disp: 1 mL, Rfl: 0 .  diclofenac (VOLTAREN) 75 MG EC tablet, Take 1 tablet by mouth twice daily, Disp: 60 tablet, Rfl: 5 .  docusate sodium (COLACE) 100 MG capsule, Take 1 capsule (100 mg total) by mouth 2 (two) times daily. (Patient taking differently: Take 100 mg by mouth daily as needed. ), Disp: 10 capsule, Rfl: 0 .  ferrous sulfate 325 (65 FE) MG tablet, Take 325 mg by mouth daily., Disp: , Rfl:  .  gabapentin (NEURONTIN) 300 MG capsule, Take 1 capsule (300 mg total) by mouth 2 (two) times daily., Disp: 60 capsule, Rfl: 11 .  Glucosamine HCl-MSM (GLUCOSAMINE-MSM PO), Take 2 tablets by mouth daily., Disp: , Rfl:  .  Multiple Vitamins-Minerals (MULTI-VITAMIN GUMMIES PO), Take 2 each by mouth daily. , Disp: , Rfl:  .  omeprazole (PRILOSEC) 40 MG capsule, Take 1 capsule (40 mg total) by mouth daily., Disp: 30 capsule, Rfl: 11 .  PEG 400 Monostearate POWD, Take 17 g by mouth daily., Disp: 454 g, Rfl: 5 .  sucralfate (CARAFATE) 1 g tablet, Take 2 tablets (2 g total) by mouth 4 (four) times daily -  with meals and at bedtime., Disp:  120 tablet, Rfl: 1 .  tiZANidine (ZANAFLEX) 4 MG tablet, TAKE 1 TABLET BY MOUTH AT BEDTIME AS NEEDED FOR MUSCLE SPASMS, Disp: 60 tablet, Rfl: 11 .  tolterodine (DETROL LA) 2 MG 24 hr capsule, Take 1 capsule (2 mg total) by mouth daily., Disp: 90 capsule, Rfl: 3 .  traMADol (ULTRAM) 50 MG tablet, Take 1 tablet (50 mg total) by mouth every 6 (six) hours as needed for moderate pain., Disp: 90 tablet, Rfl: 2  Assessment/ Plan: 73 y.o. female   1. 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  Dispense: 60 capsule; Refill: 11 - traMADol (ULTRAM) 50 MG  tablet; Take 1 tablet (50 mg total) by mouth every 6 (six) hours as needed for moderate pain.  Dispense: 90 tablet; Refill: 2  2. Gastroesophageal reflux disease without esophagitis - sucralfate (CARAFATE) 1 g tablet; Take 2 tablets (2 g total) by mouth 4 (four) times daily -  with meals and at bedtime.  Dispense: 120 tablet; Refill: 1  3. PUD (peptic ulcer disease) - sucralfate (CARAFATE) 1 g tablet; Take 2 tablets (2 g total) by mouth 4 (four) times daily -  with meals and at bedtime.  Dispense: 120 tablet; Refill: 1  4. OAB (overactive bladder) - tolterodine (DETROL LA) 2 MG 24 hr capsule; Take 1 capsule (2 mg total) by mouth daily.  Dispense: 90 capsule; Refill: 3  5. Lumbar back pain - Ambulatory referral to Physical Therapy  6. DDD (degenerative disc disease), lumbar - Ambulatory referral to Physical Therapy   No follow-ups on file.  Continue all other maintenance medications as listed above.  Start time: 10:35 AM End time: 10:44 AM  Meds ordered this encounter  Medications  . gabapentin (NEURONTIN) 300 MG capsule    Sig: Take 1 capsule (300 mg total) by mouth 2 (two) times daily.    Dispense:  60 capsule    Refill:  11    Order Specific Question:   Supervising Provider    Answer:   Raliegh IpGOTTSCHALK, ASHLY M [[id number]]  . sucralfate (CARAFATE) 1 g tablet    Sig: Take 2 tablets (2 g total) by mouth 4 (four) times daily -  with meals and at bedtime.    Dispense:  120 tablet    Refill:  1    Please consider 90 day supplies to promote better adherence    Order Specific Question:   Supervising Provider    Answer:   Raliegh IpGOTTSCHALK, ASHLY M [[id number]][[id number]]  . tolterodine (DETROL LA) 2 MG 24 hr capsule    Sig: Take 1 capsule (2 mg total) by mouth daily.    Dispense:  90 capsule    Refill:  3    Order Specific Question:   Supervising Provider    Answer:   Raliegh IpGOTTSCHALK, ASHLY M [[id number]][[id number]]  . traMADol (ULTRAM) 50 MG tablet    Sig: Take 1 tablet (50 mg total) by mouth every 6 (six) hours as  needed for moderate pain.    Dispense:  90 tablet    Refill:  2    Order Specific Question:   Supervising Provider    Answer:   Raliegh IpGOTTSCHALK, ASHLY M [[id number]]  . PEG 400 Monostearate POWD    Sig: Take 17 g by mouth daily.    Dispense:  454 g    Refill:  5    Order Specific Question:   Supervising Provider  Answer:   Janora Norlander [[id number]]    Particia Nearing PA-C [place] [phone]

## 2019-05-11 ENCOUNTER — Other Ambulatory Visit: Payer: Self-pay

## 2019-05-11 ENCOUNTER — Encounter: Payer: Self-pay | Admitting: Physical Therapy

## 2019-05-11 ENCOUNTER — Ambulatory Visit: Payer: Medicare HMO | Attending: Physician Assistant | Admitting: Physical Therapy

## 2019-05-11 DIAGNOSIS — G8929 Other chronic pain: Secondary | ICD-10-CM

## 2019-05-11 DIAGNOSIS — R293 Abnormal posture: Secondary | ICD-10-CM | POA: Diagnosis not present

## 2019-05-11 DIAGNOSIS — M545 Low back pain, unspecified: Secondary | ICD-10-CM

## 2019-05-11 NOTE — Therapy (Signed)
[facility] [address] [place], [place], [address] Phone: [phone]   Fax:  [phone]  Physical Therapy Evaluation  Patient Details  Name: Tiffany Bradley H Sherburn MRN: [id number] Date of Birth: 05/29/1946 Referring Provider (PT): Prudy FeelerAngel Jones, PA-C   Encounter Date: 05/11/2019  PT End of Session - 05/11/19 1124    Visit Number  1    Number of Visits  12    Date for PT Re-Evaluation  06/29/19    Authorization Type  FOTO; progress note every 10th visit; KX modifier at 15th visit    PT Start Time  1030    PT Stop Time  1113    PT Time Calculation (min)  43 min    Activity Tolerance  Patient tolerated treatment well    Behavior During Therapy  [facility] for tasks assessed/performed       Past Medical History:  Diagnosis Date  . Arthritis   . Back pain   . Cataract   . DJD (degenerative joint disease)   . Full dentures   . GERD (gastroesophageal reflux disease)   . Glaucoma   . History of bleeding ulcers    patient reports multiple hospitalizations remotely  . Hypertension   . Wears glasses     Past Surgical History:  Procedure Laterality Date  . BREAST REDUCTION SURGERY Bilateral 04/25/2013   Procedure: MAMMARY REDUCTION  (BREAST);  Surgeon: Louisa SecondGerald Truesdale, MD;  Location: [facility];  Service: Plastics;  Laterality: Bilateral;  . COLONOSCOPY     2008, normal per patient  . COLONOSCOPY N/A 01/16/2017   Procedure: COLONOSCOPY;  Surgeon: West BaliFields, Sandi L, MD;  [facility];  Service: Endoscopy;  Laterality: N/A;  830   . COSMETIC SURGERY    . ESOPHAGOGASTRODUODENOSCOPY N/A 01/16/2017   Procedure: ESOPHAGOGASTRODUODENOSCOPY (EGD);  Surgeon: West BaliFields, Sandi L, MD;  [facility];  Service: Endoscopy;  Laterality: N/A;  . PARTIAL COLECTOMY N/A 06/29/2016   Procedure: PARTIAL COLECTOMY;  Surgeon: Ancil LinseyJason Evan Davis, MD;  Location[facility];  Service: General;  Laterality: N/A;  . TOTAL KNEE ARTHROPLASTY Left  02/11/2018   Procedure: TOTAL KNEE ARTHROPLASTY;  Surgeon: Vickki HearingHarrison, Stanley E, MD;  Location[facility];  Service: Orthopedics;  Laterality: Left;  DePuy   . TUBAL LIGATION      There were no vitals filed for this visit.   Subjective Assessment - 05/11/19 1208    Subjective  COVID-19 screening performed upon arrival.Patient arrives to physical therapy with reports of ongoing low back pain that exacerbated June 2020. Patient reports difficulties with ADLs and yard work and is only able to perform activities for a half hour before she needs to sit down. Patient report pain at worst as 8/10 and pain at best 0/10. She notices she leans forward to alleviate the pain and leans forward during her showers as well. Patient denies LE pain and denies numbness and tingling down LEs. Patient's goals are to decrease pain, improve movement, and improve ability to perform ADLS and yard work activities.    Pertinent History  L TKA, HTN, Osteoporosis    Currently in Pain?  Yes    Pain Score  4     Pain Location  Back    Pain Orientation  Lower    Pain Descriptors / Indicators  Aching;Sore;Tightness    Pain Type  Chronic pain    Pain Onset  More than a month ago    Pain Frequency  Intermittent    Aggravating  Factors   walking for a long time, standing, yard work    Pain Relieving Factors  bending forward, sitting    Effect of Pain on Daily Activities  increased time for ADLs and home and yard activities.         [facility] PT Assessment - 05/11/19 0001      Assessment   Medical Diagnosis  Lumbar back pain, DDD lumbar    Referring Provider (PT)  Particia Nearing, PA-C    Onset Date/Surgical Date  --   ongoing, exacerbation June 2020   Next MD Visit  March 2021    Prior Therapy  yes      Precautions   Precautions  None      Restrictions   Weight Bearing Restrictions  No      Balance Screen   Has the patient fallen in the past 6 months  No    Has the patient had a decrease in activity level because of a  fear of falling?   No    Is the patient reluctant to leave their home because of a fear of falling?   No      Home Film/video editor residence    Living Arrangements  Other relatives      Prior Function   Level of Independence  Independent      Observation/Other Assessments   Focus on Therapeutic Outcomes (FOTO)   64% limited      ROM / Strength   AROM / PROM / Strength  Strength      Strength   Strength Assessment Site  Hip;Knee    Right/Left Hip  Right;Left    Right Hip Flexion  4/5    Right Hip ABduction  3+/5    Left Hip Flexion  4/5    Left Hip ABduction  4/5    Right/Left Knee  Right;Left    Right Knee Flexion  4+/5    Right Knee Extension  4/5    Left Knee Flexion  4+/5    Left Knee Extension  4/5      Palpation   SI assessment   Equal leg lengths and ASIS    Palpation comment  very tender to palpation to bilateral QLs, and upper gluteals      Transfers   Comments  Independent with transfers and bed mobility      Ambulation/Gait   Gait Pattern  Trunk flexed;Decreased stride length;Step-through pattern                Objective measurements completed on examination: See above findings.      [facility] Adult PT Treatment/Exercise - 05/11/19 0001      Modalities   Modalities  Moist Heat;Electrical Stimulation      Moist Heat Therapy   Number Minutes Moist Heat  10 Minutes    Moist Heat Location  Lumbar Spine      Electrical Stimulation   Electrical Stimulation Location  lumbar spine    Electrical Stimulation Action  IFC    Electrical Stimulation Parameters  80-150 hz x10 mins    Electrical Stimulation Goals  Pain             PT Education - 05/11/19 1214    Education Details  draw ins, SKTC, DKTC, marching, hip adduction    Person(s) Educated  Patient    Methods  Explanation;Demonstration;Handout    Comprehension  Verbalized understanding          PT Long Term  Goals - 05/11/19 1217      PT LONG TERM GOAL #1    Title  Patient will be independent with HEP.    Time  6    Period  Weeks    Status  New      PT LONG TERM GOAL #2   Title  Patient will report ability to perform ADLs and yark work with low back pain less than 3/10.    Time  6    Period  Weeks    Status  New      PT LONG TERM GOAL #3   Title  Patient will report ability to stand for greater than 15 minutes to perform house work and shower activities.    Time  6    Period  Weeks    Status  New      PT LONG TERM GOAL #4   Title  --      PT LONG TERM GOAL #5   Title  --             Plan - 05/11/19 1242    Clinical Impression Statement  Patient is a 73 year old female who presents to physical therapy with low back pain and abnormal posture that has exacerbate in June 2020. Patient very tender to palpation to bilateral lumbar paraspinals and QL with increased muscular tension. Patient noted with decreased lumbar lordosis and forward flexed position in standing. Patient and PT discussed HEP to which patient reported understanding. Patient would benefit from skilled physical therapy to address deficits and goals.    Examination-Activity Limitations  Reach Overhead    Stability/Clinical Decision Making  Stable/Uncomplicated    Clinical Decision Making  Low    Rehab Potential  Good    PT Frequency  2x / week    PT Duration  6 weeks    PT Treatment/Interventions  ADLs/Self Care Home Management;Cryotherapy;Electrical Stimulation;Ultrasound;Traction;Moist Heat;Therapeutic exercise;Therapeutic activities    PT Next Visit Plan  nustep, general strengthening, core stability, modalities PRN for pain relief    PT Home Exercise Plan  see patient education sectoin    Consulted and Agree with Plan of Care  Patient       Patient will benefit from skilled therapeutic intervention in order to improve the following deficits and impairments:  Postural dysfunction, Pain, Decreased activity tolerance, Difficulty walking, Decreased  strength  Visit Diagnosis: Chronic bilateral low back pain without sciatica  Abnormal posture     Problem List Patient Active Problem List   Diagnosis Date Noted  . PUD (peptic ulcer disease) 05/09/2019  . OAB (overactive bladder) 05/09/2019  . Lumbar back pain 05/09/2019  . DDD (degenerative disc disease), lumbar 05/09/2019  . Status post total knee replacement, 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 02/11/2018  . Iron deficiency anemia 04/28/2017  . Vitamin D deficiency 04/28/2017  . Osteopenia 04/28/2017  . Gastritis, erosive   . Abdominal pain, epigastric 12/12/2016  . Colon cancer screening 11/03/2016  . Essential hypertension 11/03/2016  . Primary osteoarthritis of both knees 11/03/2016  . GERD (gastroesophageal reflux disease) 11/03/2016  . Age-related osteoporosis without current pathological fracture 11/03/2016  . Perforation of sigmoid colon (HCC) 06/29/2016    Guss Bunde, PT, DPT 05/11/2019, 2:45 PM  [facility] [address] [place], [place], [address] Phone: [phone]   Fax:  [phone]  Name: CHARLA FURY MRN: [id number] Date of Birth: 07/28/1946

## 2019-05-16 ENCOUNTER — Other Ambulatory Visit: Payer: Self-pay

## 2019-05-16 ENCOUNTER — Ambulatory Visit: Payer: Medicare HMO | Admitting: Physical Therapy

## 2019-05-16 ENCOUNTER — Encounter: Payer: Self-pay | Admitting: Physical Therapy

## 2019-05-16 DIAGNOSIS — R293 Abnormal posture: Secondary | ICD-10-CM

## 2019-05-16 DIAGNOSIS — G8929 Other chronic pain: Secondary | ICD-10-CM | POA: Diagnosis not present

## 2019-05-16 DIAGNOSIS — M545 Low back pain: Secondary | ICD-10-CM | POA: Diagnosis not present

## 2019-05-16 NOTE — Therapy (Signed)
[facility] [place], [place], [address] Phone: [phone]   Fax:  [phone]  Physical Therapy Treatment  Patient Details  Name: Tiffany Bradley MRN: [id number] Date of Birth: 1946/08/19 Referring Provider (PT): Particia Nearing, PA-C   Encounter Date: 05/16/2019  PT End of Session - 05/16/19 0910    Visit Number  2    Number of Visits  12    Date for PT Re-Evaluation  06/29/19    Authorization Type  FOTO; progress note every 10th visit; KX modifier at 15th visit    PT Start Time  0900    PT Stop Time  0948    PT Time Calculation (min)  48 min    Activity Tolerance  Patient tolerated treatment well    Behavior During Therapy  [facility] for tasks assessed/performed       Past Medical History:  Diagnosis Date  . Arthritis   . Back pain   . Cataract   . DJD (degenerative joint disease)   . Full dentures   . GERD (gastroesophageal reflux disease)   . Glaucoma   . History of bleeding ulcers    patient reports multiple hospitalizations remotely  . Hypertension   . Wears glasses     Past Surgical History:  Procedure Laterality Date  . BREAST REDUCTION SURGERY Bilateral 04/25/2013   Procedure: MAMMARY REDUCTION  (BREAST);  Surgeon: Cristine Polio, MD;  Location: [place];  Service: Plastics;  Laterality: Bilateral;  . COLONOSCOPY     2008, normal per patient  . COLONOSCOPY N/A 01/16/2017   Procedure: COLONOSCOPY;  Surgeon: Danie Binder, MD;  [facility];  Service: Endoscopy;  Laterality: N/A;  830   . COSMETIC SURGERY    . ESOPHAGOGASTRODUODENOSCOPY N/A 01/16/2017   Procedure: ESOPHAGOGASTRODUODENOSCOPY (EGD);  Surgeon: Danie Binder, MD;  [facility];  Service: Endoscopy;  Laterality: N/A;  . PARTIAL COLECTOMY N/A 06/29/2016   Procedure: PARTIAL COLECTOMY;  Surgeon: Vickie Epley, MD;  [facility];  Service: General;  Laterality: N/A;  . TOTAL KNEE ARTHROPLASTY Left  02/11/2018   Procedure: TOTAL KNEE ARTHROPLASTY;  Surgeon: Carole Civil, MD;  [facility];  Service: Orthopedics;  Laterality: Left;  DePuy   . TUBAL LIGATION      There were no vitals filed for this visit.  Subjective Assessment - 05/16/19 0910    Subjective  COVID 19 screening performed on patient upon arrival. Patient reports stiffness in her back this morning.    Pertinent History  L TKA, HTN, Osteoporosis    Currently in Pain?  Yes    Pain Score  5     Pain Location  Back    Pain Orientation  Lower    Pain Descriptors / Indicators  Discomfort;Other (Comment)   stiffness   Pain Type  Chronic pain    Pain Onset  More than a month ago    Pain Frequency  Intermittent         [facility] PT Assessment - 05/16/19 0001      Assessment   Medical Diagnosis  Lumbar back pain, DDD lumbar    Referring Provider (PT)  Particia Nearing, PA-C    Next MD Visit  March 2021    Prior Therapy  yes      Precautions   Precautions  None      Restrictions   Weight Bearing Restrictions  No  [facility] Adult PT Treatment/Exercise - 05/16/19 0001      Exercises   Exercises  Lumbar      Lumbar Exercises: Stretches   Single Knee to Chest Stretch  Right;Left;2 reps;30 seconds      Lumbar Exercises: Aerobic   Nustep  L5 x15 min      Lumbar Exercises: Supine   Ab Set  15 reps;5 seconds    Glut Set  15 reps;5 seconds    Bent Knee Raise  20 reps    Bridge  15 reps;2 seconds      Modalities   Modalities  Moist Heat;Electrical Stimulation      Moist Heat Therapy   Number Minutes Moist Heat  15 Minutes    Moist Heat Location  Lumbar Spine      Electrical Stimulation   Electrical Stimulation Location  lumbar spine    Electrical Stimulation Action  IFC    Electrical Stimulation Parameters  80-150 hz x15 min    Electrical Stimulation Goals  Pain                  PT Long Term Goals - 05/16/19 0165      PT LONG TERM GOAL #1   Title  Patient will  be independent with HEP.    Time  6    Period  Weeks    Status  Partially Met      PT LONG TERM GOAL #2   Title  Patient will report ability to perform ADLs and yark work with low back pain less than 3/10.    Time  6    Period  Weeks    Status  On-going      PT LONG TERM GOAL #3   Title  Patient will report ability to stand for greater than 15 minutes to perform house work and shower activities.    Time  6    Period  Weeks    Status  On-going      PT LONG TERM GOAL #4   Title  --      PT LONG TERM GOAL #5   Title  --            Plan - 05/16/19 0940    Clinical Impression Statement  Patient presented in clinic with reports of stiffness more in the mornings but also has pain with ADLs. Patient partially able to complete HEP but limited with surfaces as bed is softer and it is difficult per patient report. Patient able to tolerate therex session well with no reports of added discomfort. Normal modalities response noted following removal of the modalities.    Examination-Activity Limitations  Reach Overhead    Stability/Clinical Decision Making  Stable/Uncomplicated    Rehab Potential  Good    PT Frequency  2x / week    PT Duration  6 weeks    PT Treatment/Interventions  ADLs/Self Care Home Management;Cryotherapy;Electrical Stimulation;Ultrasound;Traction;Moist Heat;Therapeutic exercise;Therapeutic activities    PT Next Visit Plan  nustep, general strengthening, core stability, modalities PRN for pain relief    PT Home Exercise Plan  see patient education sectoin    Consulted and Agree with Plan of Care  Patient       Patient will benefit from skilled therapeutic intervention in order to improve the following deficits and impairments:  Postural dysfunction, Pain, Decreased activity tolerance, Difficulty walking, Decreased strength  Visit Diagnosis: Chronic bilateral low back pain without sciatica  Abnormal posture     Problem List Patient  Active Problem List    Diagnosis Date Noted  . PUD (peptic ulcer disease) 05/09/2019  . OAB (overactive bladder) 05/09/2019  . Lumbar back pain 05/09/2019  . DDD (degenerative disc disease), lumbar 05/09/2019  . Status post total knee replacement, 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 02/11/2018  . Iron deficiency anemia 04/28/2017  . Vitamin D deficiency 04/28/2017  . Osteopenia 04/28/2017  . Gastritis, erosive   . Abdominal pain, epigastric 12/12/2016  . Colon cancer screening 11/03/2016  . Essential hypertension 11/03/2016  . Primary osteoarthritis of both knees 11/03/2016  . GERD (gastroesophageal reflux disease) 11/03/2016  . Age-related osteoporosis without current pathological fracture 11/03/2016  . Perforation of sigmoid colon ([place]) 06/29/2016    Standley Brooking, PTA 05/16/2019, 10:00 AM  [facility] [place], [place], [address] Phone: [phone]   Fax:  [phone]  Name: Tiffany Bradley MRN: [id number] Date of Birth: 1946/01/19

## 2019-05-18 ENCOUNTER — Other Ambulatory Visit: Payer: Self-pay

## 2019-05-18 ENCOUNTER — Ambulatory Visit: Payer: Medicare HMO | Admitting: Physical Therapy

## 2019-05-18 ENCOUNTER — Encounter: Payer: Self-pay | Admitting: Physical Therapy

## 2019-05-18 DIAGNOSIS — R293 Abnormal posture: Secondary | ICD-10-CM | POA: Diagnosis not present

## 2019-05-18 DIAGNOSIS — M545 Low back pain, unspecified: Secondary | ICD-10-CM

## 2019-05-18 DIAGNOSIS — G8929 Other chronic pain: Secondary | ICD-10-CM

## 2019-05-18 NOTE — Therapy (Signed)
[facility] [place], [place], [address] Phone: [phone]   Fax:  [phone]  Physical Therapy Treatment  Patient Details  Name: Tiffany Bradley MRN: [id number] Date of Birth: 1945/09/21 Referring Provider (PT): Particia Nearing, PA-C   Encounter Date: 05/18/2019  PT End of Session - 05/18/19 0856    Visit Number  3    Number of Visits  12    Date for PT Re-Evaluation  06/29/19    Authorization Type  FOTO; progress note every 10th visit; KX modifier at 15th visit    PT Start Time  0900    PT Stop Time  0947    PT Time Calculation (min)  47 min    Activity Tolerance  Patient tolerated treatment well    Behavior During Therapy  [facility] for tasks assessed/performed       Past Medical History:  Diagnosis Date  . Arthritis   . Back pain   . Cataract   . DJD (degenerative joint disease)   . Full dentures   . GERD (gastroesophageal reflux disease)   . Glaucoma   . History of bleeding ulcers    patient reports multiple hospitalizations remotely  . Hypertension   . Wears glasses     Past Surgical History:  Procedure Laterality Date  . BREAST REDUCTION SURGERY Bilateral 04/25/2013   Procedure: MAMMARY REDUCTION  (BREAST);  Surgeon: Cristine Polio, MD;  Location: [place];  Service: Plastics;  Laterality: Bilateral;  . COLONOSCOPY     2008, normal per patient  . COLONOSCOPY N/A 01/16/2017   Procedure: COLONOSCOPY;  Surgeon: Danie Binder, MD;  [facility];  Service: Endoscopy;  Laterality: N/A;  830   . COSMETIC SURGERY    . ESOPHAGOGASTRODUODENOSCOPY N/A 01/16/2017   Procedure: ESOPHAGOGASTRODUODENOSCOPY (EGD);  Surgeon: Danie Binder, MD;  [facility];  Service: Endoscopy;  Laterality: N/A;  . PARTIAL COLECTOMY N/A 06/29/2016   Procedure: PARTIAL COLECTOMY;  Surgeon: Vickie Epley, MD;  [facility];  Service: General;  Laterality: N/A;  . TOTAL KNEE ARTHROPLASTY Left  02/11/2018   Procedure: TOTAL KNEE ARTHROPLASTY;  Surgeon: Carole Civil, MD;  [facility];  Service: Orthopedics;  Laterality: Left;  DePuy   . TUBAL LIGATION      There were no vitals filed for this visit.  Subjective Assessment - 05/18/19 0856    Subjective  COVID 19 screening performed on patient upon arrival. Patient reports her back feels okay today. Unable to sleep well last night but when she was up she did her exercises.    Pertinent History  L TKA, HTN, Osteoporosis    Currently in Pain?  Yes    Pain Score  4     Pain Location  Back    Pain Orientation  Lower    Pain Descriptors / Indicators  Discomfort    Pain Type  Chronic pain    Pain Onset  More than a month ago    Pain Frequency  Intermittent         [facility] PT Assessment - 05/18/19 0001      Assessment   Medical Diagnosis  Lumbar back pain, DDD lumbar    Referring Provider (PT)  Particia Nearing, PA-C    Next MD Visit  March 2021    Prior Therapy  yes      Precautions   Precautions  None      Restrictions   Weight Bearing Restrictions  No                   [facility] Adult PT Treatment/Exercise - 05/18/19 0001      Lumbar Exercises: Aerobic   Nustep  L5 x15 min      Lumbar Exercises: Standing   Shoulder Extension  Strengthening;Both;20 reps;Limitations    Shoulder Extension Limitations  green xts      Lumbar Exercises: Supine   Ab Set  15 reps;5 seconds    Clam  20 reps;5 seconds   green theraband   Bent Knee Raise  20 reps    Bridge  15 reps;3 seconds      Modalities   Modalities  Moist Heat;Electrical Stimulation      Moist Heat Therapy   Number Minutes Moist Heat  15 Minutes    Moist Heat Location  Lumbar Spine      Electrical Stimulation   Electrical Stimulation Location  B low back    Electrical Stimulation Action  Pre-Mod    Electrical Stimulation Parameters  80-150 hz x15 min    Electrical Stimulation Goals  Pain                  PT Long Term Goals -  05/18/19 0954      PT LONG TERM GOAL #1   Title  Patient will be independent with HEP.    Time  6    Period  Weeks    Status  Achieved      PT LONG TERM GOAL #2   Title  Patient will report ability to perform ADLs and yark work with low back pain less than 3/10.    Time  6    Period  Weeks    Status  On-going      PT LONG TERM GOAL #3   Title  Patient will report ability to stand for greater than 15 minutes to perform house work and shower activities.    Time  6    Period  Weeks    Status  On-going      PT LONG TERM GOAL #4   Title  --      PT LONG TERM GOAL #5   Title  --            Plan - 05/18/19 0941    Clinical Impression Statement  Patient presented in clinic with low level lumbar discomfort. Patient compliant with HEP per patient report. Patient able to complete therex with good core activation technique with VCs. No complaints of any increased lumbar pain following therex session. Normal modalities response noted following removal of the modalities.    Examination-Activity Limitations  Reach Overhead    Stability/Clinical Decision Making  Stable/Uncomplicated    Rehab Potential  Good    PT Frequency  2x / week    PT Duration  6 weeks    PT Treatment/Interventions  ADLs/Self Care Home Management;Cryotherapy;Electrical Stimulation;Ultrasound;Traction;Moist Heat;Therapeutic exercise;Therapeutic activities    PT Next Visit Plan  nustep, general strengthening, core stability, modalities PRN for pain relief    PT Home Exercise Plan  see patient education sectoin    Consulted and Agree with Plan of Care  Patient       Patient will benefit from skilled therapeutic intervention in order to improve the following deficits and impairments:  Postural dysfunction, Pain, Decreased activity tolerance, Difficulty walking, Decreased strength  Visit Diagnosis: Chronic bilateral low back pain without sciatica  Abnormal posture     Problem List  Patient Active Problem List    Diagnosis Date Noted  . PUD (peptic ulcer disease) 05/09/2019  . OAB (overactive bladder) 05/09/2019  . Lumbar back pain 05/09/2019  . DDD (degenerative disc disease), lumbar 05/09/2019  . Status post total knee replacement, 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 02/11/2018  . Iron deficiency anemia 04/28/2017  . Vitamin D deficiency 04/28/2017  . Osteopenia 04/28/2017  . Gastritis, erosive   . Abdominal pain, epigastric 12/12/2016  . Colon cancer screening 11/03/2016  . Essential hypertension 11/03/2016  . Primary osteoarthritis of both knees 11/03/2016  . GERD (gastroesophageal reflux disease) 11/03/2016  . Age-related osteoporosis without current pathological fracture 11/03/2016  . Perforation of sigmoid colon (HCC) 06/29/2016    Marvell FullerKelsey P , PTA 05/18/2019, 9:54 AM  [facility] [address] [place], [place], [address] Phone: [phone]   Fax:  [phone]  Name: Tiffany Bradley MRN: [id number] Date of Birth: 10/03/1945

## 2019-05-23 ENCOUNTER — Encounter: Payer: Self-pay | Admitting: Physical Therapy

## 2019-05-23 ENCOUNTER — Other Ambulatory Visit: Payer: Self-pay

## 2019-05-23 ENCOUNTER — Ambulatory Visit: Payer: Medicare HMO | Admitting: Physical Therapy

## 2019-05-23 DIAGNOSIS — G8929 Other chronic pain: Secondary | ICD-10-CM

## 2019-05-23 DIAGNOSIS — R293 Abnormal posture: Secondary | ICD-10-CM | POA: Diagnosis not present

## 2019-05-23 DIAGNOSIS — M545 Low back pain: Secondary | ICD-10-CM | POA: Diagnosis not present

## 2019-05-23 NOTE — Therapy (Signed)
[facility] [place], [place], [address] Phone: [phone]   Fax:  [phone]  Physical Therapy Treatment  Patient Details  Name: Tiffany Bradley MRN: [id number] Date of Birth: 1946/04/19 Referring Provider (PT): Particia Nearing, PA-C   Encounter Date: 05/23/2019  PT End of Session - 05/23/19 0924    Visit Number  4    Number of Visits  12    Date for PT Re-Evaluation  06/29/19    Authorization Type  FOTO; progress note every 10th visit; KX modifier at 15th visit    PT Start Time  0903    PT Stop Time  0952    PT Time Calculation (min)  49 min    Activity Tolerance  Patient tolerated treatment well    Behavior During Therapy  [facility] for tasks assessed/performed       Past Medical History:  Diagnosis Date  . Arthritis   . Back pain   . Cataract   . DJD (degenerative joint disease)   . Full dentures   . GERD (gastroesophageal reflux disease)   . Glaucoma   . History of bleeding ulcers    patient reports multiple hospitalizations remotely  . Hypertension   . Wears glasses     Past Surgical History:  Procedure Laterality Date  . BREAST REDUCTION SURGERY Bilateral 04/25/2013   Procedure: MAMMARY REDUCTION  (BREAST);  Surgeon: Cristine Polio, MD;  Location: [place];  Service: Plastics;  Laterality: Bilateral;  . COLONOSCOPY     2008, normal per patient  . COLONOSCOPY N/A 01/16/2017   Procedure: COLONOSCOPY;  Surgeon: Danie Binder, MD;  [facility];  Service: Endoscopy;  Laterality: N/A;  830   . COSMETIC SURGERY    . ESOPHAGOGASTRODUODENOSCOPY N/A 01/16/2017   Procedure: ESOPHAGOGASTRODUODENOSCOPY (EGD);  Surgeon: Danie Binder, MD;  [facility];  Service: Endoscopy;  Laterality: N/A;  . PARTIAL COLECTOMY N/A 06/29/2016   Procedure: PARTIAL COLECTOMY;  Surgeon: Vickie Epley, MD;  [facility];  Service: General;  Laterality: N/A;  . TOTAL KNEE ARTHROPLASTY Left  02/11/2018   Procedure: TOTAL KNEE ARTHROPLASTY;  Surgeon: Carole Civil, MD;  [facility];  Service: Orthopedics;  Laterality: Left;  DePuy   . TUBAL LIGATION      There were no vitals filed for this visit.  Subjective Assessment - 05/23/19 0906    Subjective  COVID 19 screening performed on patient upon arrival. Reports more stiffness as she sat a lot this weekend.    Pertinent History  L TKA, HTN, Osteoporosis    Currently in Pain?  No/denies         [facility] PT Assessment - 05/23/19 0001      Assessment   Medical Diagnosis  Lumbar back pain, DDD lumbar    Referring Provider (PT)  Particia Nearing, PA-C    Next MD Visit  March 2021    Prior Therapy  yes      Precautions   Precautions  None      Restrictions   Weight Bearing Restrictions  No                   [facility] Adult PT Treatment/Exercise - 05/23/19 0001      Lumbar Exercises: Aerobic   Nustep  L5 x18 min      Lumbar Exercises: Machines for Strengthening   Cybex Lumbar Extension  60# x20 reps      Lumbar Exercises: Standing  Shoulder Extension  Strengthening;Both;20 reps;Limitations    Shoulder Extension Limitations  Orange XTS      Lumbar Exercises: Seated   Sit to Stand  15 reps      Lumbar Exercises: Supine   Bridge  15 reps;3 seconds      Modalities   Modalities  Electrical Stimulation;Moist Heat      Moist Heat Therapy   Number Minutes Moist Heat  15 Minutes    Moist Heat Location  Lumbar Spine      Electrical Stimulation   Electrical Stimulation Location  B low back    Electrical Stimulation Action  Pre-Mod    Electrical Stimulation Parameters  80-150 hz x15 min    Electrical Stimulation Goals  Pain                  PT Long Term Goals - 05/18/19 0954      PT LONG TERM GOAL #1   Title  Patient will be independent with HEP.    Time  6    Period  Weeks    Status  Achieved      PT LONG TERM GOAL #2   Title  Patient will report ability to perform ADLs and yark  work with low back pain less than 3/10.    Time  6    Period  Weeks    Status  On-going      PT LONG TERM GOAL #3   Title  Patient will report ability to stand for greater than 15 minutes to perform house work and shower activities.    Time  6    Period  Weeks    Status  On-going      PT LONG TERM GOAL #4   Title  --      PT LONG TERM GOAL #5   Title  --            Plan - 05/23/19 0942    Clinical Impression Statement  Patient presented in clinic with reports of stiffness after prolonged sitting over the weekend. Patient able to complete therex fairly well. More postural cues provided today for more erect stance. LE weakness noted as well during sit to stands. Patient initially attempted without UE support but with difficulty. Patient utilized plinth for eccentric control during stand > sit. Normal modalities response noted following removal of the modalities.    Examination-Activity Limitations  Reach Overhead    Stability/Clinical Decision Making  Stable/Uncomplicated    Rehab Potential  Good    PT Frequency  2x / week    PT Duration  6 weeks    PT Treatment/Interventions  ADLs/Self Care Home Management;Cryotherapy;Electrical Stimulation;Ultrasound;Traction;Moist Heat;Therapeutic exercise;Therapeutic activities    PT Next Visit Plan  nustep, general strengthening, core stability, modalities PRN for pain relief    PT Home Exercise Plan  see patient education sectoin    Consulted and Agree with Plan of Care  Patient       Patient will benefit from skilled therapeutic intervention in order to improve the following deficits and impairments:  Postural dysfunction, Pain, Decreased activity tolerance, Difficulty walking, Decreased strength  Visit Diagnosis: Chronic bilateral low back pain without sciatica  Abnormal posture     Problem List Patient Active Problem List   Diagnosis Date Noted  . PUD (peptic ulcer disease) 05/09/2019  . OAB (overactive bladder) 05/09/2019   . Lumbar back pain 05/09/2019  . DDD (degenerative disc disease), lumbar 05/09/2019  . 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 Iron deficiency anemia 04/28/2017  . Vitamin D deficiency 04/28/2017  . Osteopenia 04/28/2017  . Gastritis, erosive   . Abdominal pain, epigastric 12/12/2016  . Colon cancer screening 11/03/2016  . Essential hypertension 11/03/2016  . Primary osteoarthritis of both knees 11/03/2016  . GERD (gastroesophageal reflux disease) 11/03/2016  . Age-related osteoporosis without current pathological fracture 11/03/2016  . Perforation of sigmoid colon (HCC) 06/29/2016    Marvell Fuller, PTA 05/23/2019, 10:01 AM  [facility] [address] [place], [place], [address] Phone: [phone]   Fax:  [phone]  Name: Tiffany Bradley MRN: [id number] Date of Birth: 1946/07/05

## 2019-05-25 ENCOUNTER — Ambulatory Visit: Payer: Medicare HMO | Admitting: Physical Therapy

## 2019-05-25 ENCOUNTER — Other Ambulatory Visit: Payer: Self-pay

## 2019-05-25 ENCOUNTER — Encounter: Payer: Self-pay | Admitting: Physical Therapy

## 2019-05-25 DIAGNOSIS — R293 Abnormal posture: Secondary | ICD-10-CM

## 2019-05-25 DIAGNOSIS — G8929 Other chronic pain: Secondary | ICD-10-CM

## 2019-05-25 DIAGNOSIS — M545 Low back pain: Secondary | ICD-10-CM | POA: Diagnosis not present

## 2019-05-25 NOTE — Therapy (Signed)
[facility] [address] [place], [place], [address] Phone: [phone]   Fax:  [phone]  Physical Therapy Treatment  Patient Details  Name: Tiffany Bradley MRN: [id number] Date of Birth: 06/13/1946 Referring Provider (PT): Prudy Feeler, PA-C   Encounter Date: 05/25/2019  PT End of Session - 05/25/19 0914    Visit Number  5    Number of Visits  12    Date for PT Re-Evaluation  06/29/19    Authorization Type  FOTO; progress note every 10th visit; KX modifier at 15th visit    PT Start Time  0900    PT Stop Time  0946    PT Time Calculation (min)  46 min    Activity Tolerance  Patient tolerated treatment well    Behavior During Therapy  [facility] for tasks assessed/performed       Past Medical History:  Diagnosis Date  . Arthritis   . Back pain   . Cataract   . DJD (degenerative joint disease)   . Full dentures   . GERD (gastroesophageal reflux disease)   . Glaucoma   . History of bleeding ulcers    patient reports multiple hospitalizations remotely  . Hypertension   . Wears glasses     Past Surgical History:  Procedure Laterality Date  . BREAST REDUCTION SURGERY Bilateral 04/25/2013   Procedure: MAMMARY REDUCTION  (BREAST);  Surgeon: Louisa Second, MD;  Location: [facility];  Service: Plastics;  Laterality: Bilateral;  . COLONOSCOPY     2008, normal per patient  . COLONOSCOPY N/A 01/16/2017   Procedure: COLONOSCOPY;  Surgeon: West Bali, MD;  [facility];  Service: Endoscopy;  Laterality: N/A;  830   . COSMETIC SURGERY    . ESOPHAGOGASTRODUODENOSCOPY N/A 01/16/2017   Procedure: ESOPHAGOGASTRODUODENOSCOPY (EGD);  Surgeon: West Bali, MD;  [facility];  Service: Endoscopy;  Laterality: N/A;  . PARTIAL COLECTOMY N/A 06/29/2016   Procedure: PARTIAL COLECTOMY;  Surgeon: Ancil Linsey, MD;  [facility];  Service: General;  Laterality: N/A;  . TOTAL KNEE ARTHROPLASTY Left  02/11/2018   Procedure: TOTAL KNEE ARTHROPLASTY;  Surgeon: Vickki Hearing, MD;  [facility];  Service: Orthopedics;  Laterality: Left;  DePuy   . TUBAL LIGATION      There were no vitals filed for this visit.  Subjective Assessment - 05/25/19 0914    Subjective  COVID 19 screening performed on patient upon arrival. Reports less stiffness in low back this morning.    Pertinent History  L TKA, HTN, Osteoporosis    Currently in Pain?  No/denies         [facility] PT Assessment - 05/25/19 0001      Assessment   Medical Diagnosis  Lumbar back pain, DDD lumbar    Referring Provider (PT)  Prudy Feeler, PA-C    Next MD Visit  March 2021    Prior Therapy  yes      Precautions   Precautions  None      Restrictions   Weight Bearing Restrictions  No                   [facility] Adult PT Treatment/Exercise - 05/25/19 0001      Lumbar Exercises: Aerobic   Nustep  L4 x15 min      Lumbar Exercises: Machines for Strengthening   Cybex Lumbar Extension  50# x20 reps      Lumbar Exercises: Standing  Forward Lunge  10 reps;3 seconds   2# reachouts for BLE   Shoulder Extension  Strengthening;Both;20 reps;Limitations    Shoulder Extension Limitations  Green XTS with primary focus on scap retraction/posture      Lumbar Exercises: Supine   Bridge  15 reps;5 seconds      Modalities   Modalities  Electrical Stimulation;Moist Heat      Moist Heat Therapy   Number Minutes Moist Heat  15 Minutes    Moist Heat Location  Lumbar Spine      Electrical Stimulation   Electrical Stimulation Location  B low back    Electrical Stimulation Action  Pre-Mod    Electrical Stimulation Parameters  80-150 hz x15 min    Electrical Stimulation Goals  Other (comment)   stiffness                 PT Long Term Goals - 05/18/19 0954      PT LONG TERM GOAL #1   Title  Patient will be independent with HEP.    Time  6    Period  Weeks    Status  Achieved      PT LONG TERM GOAL #2    Title  Patient will report ability to perform ADLs and yark work with low back pain less than 3/10.    Time  6    Period  Weeks    Status  On-going      PT LONG TERM GOAL #3   Title  Patient will report ability to stand for greater than 15 minutes to perform house work and shower activities.    Time  6    Period  Weeks    Status  On-going      PT LONG TERM GOAL #4   Title  --      PT LONG TERM GOAL #5   Title  --            Plan - 05/25/19 0939    Clinical Impression Statement  Patient presented in clinic with less stiffness in lumbar spine. Patient able to report ability to stand for longer periods of time without propping such as during washing dishes and overall less pain in lumbar region at home. Patient able to progress to more functional exercises for strengthening without complaint of pain. Loss of lumbar lordosis noted in standing and sitting. Normal modalities response noted following removal of the modalities.    Examination-Activity Limitations  Reach Overhead    Stability/Clinical Decision Making  Stable/Uncomplicated    Rehab Potential  Good    PT Frequency  2x / week    PT Duration  6 weeks    PT Treatment/Interventions  ADLs/Self Care Home Management;Cryotherapy;Electrical Stimulation;Ultrasound;Traction;Moist Heat;Therapeutic exercise;Therapeutic activities    PT Next Visit Plan  nustep, general strengthening, core stability, modalities PRN for pain relief    PT Home Exercise Plan  see patient education sectoin    Consulted and Agree with Plan of Care  Patient       Patient will benefit from skilled therapeutic intervention in order to improve the following deficits and impairments:  Postural dysfunction, Pain, Decreased activity tolerance, Difficulty walking, Decreased strength  Visit Diagnosis: Chronic bilateral low back pain without sciatica  Abnormal posture     Problem List Patient Active Problem List   Diagnosis Date Noted  . PUD (peptic  ulcer disease) 05/09/2019  . OAB (overactive bladder) 05/09/2019  . Lumbar back pain 05/09/2019  . DDD (degenerative disc  disease), lumbar 05/09/2019  . Status post total knee replacement, 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 02/11/2018  . Iron deficiency anemia 04/28/2017  . Vitamin D deficiency 04/28/2017  . Osteopenia 04/28/2017  . Gastritis, erosive   . Abdominal pain, epigastric 12/12/2016  . Colon cancer screening 11/03/2016  . Essential hypertension 11/03/2016  . Primary osteoarthritis of both knees 11/03/2016  . GERD (gastroesophageal reflux disease) 11/03/2016  . Age-related osteoporosis without current pathological fracture 11/03/2016  . Perforation of sigmoid colon [facility]) 06/29/2016    Standley Brooking, PTA 05/25/2019, 9:52 AM  [facility] [address] [place], [place], [address] Phone: [phone]   Fax:  [phone]  Name: Tiffany Bradley MRN: [id number] Date of Birth: Sep 11, 1945

## 2019-05-30 ENCOUNTER — Encounter: Payer: Self-pay | Admitting: Physical Therapy

## 2019-05-30 ENCOUNTER — Other Ambulatory Visit: Payer: Self-pay

## 2019-05-30 ENCOUNTER — Ambulatory Visit: Payer: Medicare HMO | Admitting: Physical Therapy

## 2019-05-30 DIAGNOSIS — G8929 Other chronic pain: Secondary | ICD-10-CM

## 2019-05-30 DIAGNOSIS — M545 Low back pain, unspecified: Secondary | ICD-10-CM

## 2019-05-30 DIAGNOSIS — R293 Abnormal posture: Secondary | ICD-10-CM | POA: Diagnosis not present

## 2019-05-30 NOTE — Therapy (Signed)
[facility] [address] [place], [place], [address] Phone: [phone]   Fax:  [phone]  Physical Therapy Treatment  Patient Details  Name: Tiffany Bradley MRN: [id number] Date of Birth: 07/28/1946 Referring Provider (PT): Prudy Feeler, PA-C   Encounter Date: 05/30/2019  PT End of Session - 05/30/19 1033    Visit Number  6    Number of Visits  12    Date for PT Re-Evaluation  06/29/19    Authorization Type  FOTO; progress note every 10th visit; KX modifier at 15th visit    PT Start Time  1000    PT Stop Time  1045    PT Time Calculation (min)  45 min    Activity Tolerance  Patient tolerated treatment well    Behavior During Therapy  [facility] for tasks assessed/performed       Past Medical History:  Diagnosis Date  . Arthritis   . Back pain   . Cataract   . DJD (degenerative joint disease)   . Full dentures   . GERD (gastroesophageal reflux disease)   . Glaucoma   . History of bleeding ulcers    patient reports multiple hospitalizations remotely  . Hypertension   . Wears glasses     Past Surgical History:  Procedure Laterality Date  . BREAST REDUCTION SURGERY Bilateral 04/25/2013   Procedure: MAMMARY REDUCTION  (BREAST);  Surgeon: Louisa Second, MD;  Location: [facility];  Service: Plastics;  Laterality: Bilateral;  . COLONOSCOPY     2008, normal per patient  . COLONOSCOPY N/A 01/16/2017   Procedure: COLONOSCOPY;  Surgeon: West Bali, MD;  [facility];  Service: Endoscopy;  Laterality: N/A;  830   . COSMETIC SURGERY    . ESOPHAGOGASTRODUODENOSCOPY N/A 01/16/2017   Procedure: ESOPHAGOGASTRODUODENOSCOPY (EGD);  Surgeon: West Bali, MD;  [facility];  Service: Endoscopy;  Laterality: N/A;  . PARTIAL COLECTOMY N/A 06/29/2016   Procedure: PARTIAL COLECTOMY;  Surgeon: Ancil Linsey, MD;  [facility];  Service: General;  Laterality: N/A;  . TOTAL KNEE ARTHROPLASTY Left  02/11/2018   Procedure: TOTAL KNEE ARTHROPLASTY;  Surgeon: Vickki Hearing, MD;  [facility];  Service: Orthopedics;  Laterality: Left;  DePuy   . TUBAL LIGATION      There were no vitals filed for this visit.  Subjective Assessment - 05/30/19 1005    Subjective  COVID 19 screening performed on patient upon arrival. Patient arrived with no discomfort in back and "running late."    Pertinent History  L TKA, HTN, Osteoporosis    Currently in Pain?  No/denies                       [facility] Adult PT Treatment/Exercise - 05/30/19 0001      Lumbar Exercises: Aerobic   Nustep  L5 x17 min UE/LE activity, monitored      Lumbar Exercises: Machines for Strengthening   Cybex Lumbar Extension  50# x20 reps      Lumbar Exercises: Standing   Scapular Retraction  Strengthening;20 reps    Scapular Retraction Limitations  green XTS    Shoulder Extension  Strengthening;Both;20 reps;Limitations    Shoulder Extension Limitations  Green XTS      Lumbar Exercises: Supine   Bridge with clamshell  20 reps    Bridge with [employer] Limitations  red t-band      Moist Heat Therapy   Number Minutes Moist  Heat  15 Minutes    Moist Heat Location  Lumbar Spine      Electrical Stimulation   Electrical Stimulation Location  B low back    Electrical Stimulation Action  premod    Electrical Stimulation Parameters  80-150hz  x14min                  PT Long Term Goals - 05/18/19 0954      PT LONG TERM GOAL #1   Title  Patient will be independent with HEP.    Time  6    Period  Weeks    Status  Achieved      PT LONG TERM GOAL #2   Title  Patient will report ability to perform ADLs and yark work with low back pain less than 3/10.    Time  6    Period  Weeks    Status  On-going      PT LONG TERM GOAL #3   Title  Patient will report ability to stand for greater than 15 minutes to perform house work and shower activities.    Time  6    Period  Weeks    Status   On-going      PT LONG TERM GOAL #4   Title  --      PT LONG TERM GOAL #5   Title  --            Plan - 05/30/19 1034    Clinical Impression Statement  Patient tolerated treatment well today. Patient arrived late today although able to progress with core execises today. Patient reported no pain today. Patient has increased pain with any prolong standing, walking or ADL's. Patient requested modalities post exercises today. Goals progressing.    Examination-Activity Limitations  Reach Overhead    Stability/Clinical Decision Making  Stable/Uncomplicated    Rehab Potential  Good    PT Frequency  2x / week    PT Duration  6 weeks    PT Treatment/Interventions  ADLs/Self Care Home Management;Cryotherapy;Electrical Stimulation;Ultrasound;Traction;Moist Heat;Therapeutic exercise;Therapeutic activities    PT Next Visit Plan  nustep, general strengthening, core stability, modalities PRN for pain relief    Consulted and Agree with Plan of Care  Patient       Patient will benefit from skilled therapeutic intervention in order to improve the following deficits and impairments:  Postural dysfunction, Pain, Decreased activity tolerance, Difficulty walking, Decreased strength  Visit Diagnosis: Chronic bilateral low back pain without sciatica  Abnormal posture     Problem List Patient Active Problem List   Diagnosis Date Noted  . PUD (peptic ulcer disease) 05/09/2019  . OAB (overactive bladder) 05/09/2019  . Lumbar back pain 05/09/2019  . DDD (degenerative disc disease), lumbar 05/09/2019  . Status post total knee replacement, 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 02/11/2018  . Iron deficiency anemia 04/28/2017  . Vitamin D deficiency 04/28/2017  . Osteopenia 04/28/2017  . Gastritis, erosive   . Abdominal pain, epigastric 12/12/2016  . Colon cancer screening 11/03/2016  . Essential hypertension 11/03/2016  . Primary osteoarthritis of both knees 11/03/2016  . GERD (gastroesophageal reflux disease)  11/03/2016  . Age-related osteoporosis without current pathological fracture 11/03/2016  . Perforation of sigmoid colon ([place]) 06/29/2016    Kairo Laubacher P, PTA 05/30/2019, 11:08 AM  [facility] [place], [place], [address] Phone: [phone]   Fax:  [phone]  Name: Tiffany Bradley MRN: [id number] Date of Birth: 11/17/1945

## 2019-06-01 ENCOUNTER — Ambulatory Visit: Payer: Medicare HMO | Admitting: Physical Therapy

## 2019-06-01 ENCOUNTER — Other Ambulatory Visit: Payer: Self-pay

## 2019-06-01 ENCOUNTER — Encounter: Payer: Self-pay | Admitting: Physical Therapy

## 2019-06-01 DIAGNOSIS — G8929 Other chronic pain: Secondary | ICD-10-CM | POA: Diagnosis not present

## 2019-06-01 DIAGNOSIS — R293 Abnormal posture: Secondary | ICD-10-CM | POA: Diagnosis not present

## 2019-06-01 DIAGNOSIS — M545 Low back pain, unspecified: Secondary | ICD-10-CM

## 2019-06-01 NOTE — Therapy (Signed)
[facility] [address] [place], [place], [address] Phone: [phone]   Fax:  [phone]  Physical Therapy Treatment  Patient Details  Name: Tiffany Bradley MRN: [id number] Date of Birth: [birth year] Referring Provider (PT): Prudy Feeler, PA-C   Encounter Date: 06/01/2019  PT End of Session - 06/01/19 1033    Visit Number  7    Number of Visits  12    Date for PT Re-Evaluation  06/29/19    Authorization Type  FOTO; progress note every 10th visit; KX modifier at 15th visit    PT Start Time  0953    PT Stop Time  1045    PT Time Calculation (min)  52 min    Activity Tolerance  Patient tolerated treatment well    Behavior During Therapy  [facility] for tasks assessed/performed       Past Medical History:  Diagnosis Date  . Arthritis   . Back pain   . Cataract   . DJD (degenerative joint disease)   . Full dentures   . GERD (gastroesophageal reflux disease)   . Glaucoma   . History of bleeding ulcers    patient reports multiple hospitalizations remotely  . Hypertension   . Wears glasses     Past Surgical History:  Procedure Laterality Date  . BREAST REDUCTION SURGERY Bilateral 04/25/2013   Procedure: MAMMARY REDUCTION  (BREAST);  Surgeon: Louisa Second, MD;  Location: [facility];  Service: Plastics;  Laterality: Bilateral;  . COLONOSCOPY     2008, normal per patient  . COLONOSCOPY N/A 01/16/2017   Procedure: COLONOSCOPY;  Surgeon: West Bali, MD;  [facility];  Service: Endoscopy;  Laterality: N/A;  830   . COSMETIC SURGERY    . ESOPHAGOGASTRODUODENOSCOPY N/A 01/16/2017   Procedure: ESOPHAGOGASTRODUODENOSCOPY (EGD);  Surgeon: West Bali, MD;  [facility];  Service: Endoscopy;  Laterality: N/A;  . PARTIAL COLECTOMY N/A 06/29/2016   Procedure: PARTIAL COLECTOMY;  Surgeon: Ancil Linsey, MD;  Location: [facility];  Service: General;  Laterality: N/A;  . TOTAL KNEE ARTHROPLASTY Left  02/11/2018   Procedure: TOTAL KNEE ARTHROPLASTY;  Surgeon: Vickki Hearing, MD;  Location: [facility];  Service: Orthopedics;  Laterality: Left;  [place]   . TUBAL LIGATION      There were no vitals filed for this visit.  Subjective Assessment - 06/01/19 1005    Subjective  COVID 19 screening performed on patient upon arrival. Patient arrived with no discomfort in back today.    Pertinent History  L TKA, HTN, Osteoporosis    Currently in Pain?  No/denies                       [facility] Adult PT Treatment/Exercise - 06/01/19 0001      Lumbar Exercises: Aerobic   Nustep  L5 x17 min UE/LE activity, monitored      Lumbar Exercises: Machines for Strengthening   Cybex Lumbar Extension  50# x20 reps      Lumbar Exercises: Standing   Scapular Retraction  Strengthening;20 reps    Scapular Retraction Limitations  green XTS    Shoulder Extension  Strengthening;Both;20 reps;Limitations    Shoulder Extension Limitations  green xts      Lumbar Exercises: Supine   Bent Knee Raise  20 reps    Bridge with clamshell  20 reps    Bridge with Ball Squeeze Limitations  red t-band    Straight Leg Raise  2 seconds   2x10     Moist Heat Therapy   Number Minutes Moist Heat  15 Minutes    Moist Heat Location  Lumbar Spine      Electrical Stimulation   Electrical Stimulation Location  B low back    Electrical Stimulation Action  premod    Electrical Stimulation Parameters  80-150hz  x41min                  PT Long Term Goals - 06/01/19 1036      PT LONG TERM GOAL #1   Title  Patient will be independent with HEP.    Time  6    Period  Weeks    Status  Achieved      PT LONG TERM GOAL #2   Title  Patient will report ability to perform ADLs and yark work with low back pain less than 3/10.    Time  6    Period  Weeks    Status  On-going   4/10 and up at times 06/01/19     PT LONG TERM GOAL #3   Title  Patient will report ability to stand for greater than 15 minutes to  perform house work and shower activities.    Time  6    Period  Weeks    Status  On-going   57min at this time 06/01/19           Plan - 06/01/19 1037    Clinical Impression Statement  Patient progressing with all activities today. Patient able to progress core activities with good technique and no pain reported. Patient only has increased pain with prolong activity, walkin, standing and ADL's. Goals progressing at this time. Modalities post exercises per patient request.    Examination-Activity Limitations  Reach Overhead    Stability/Clinical Decision Making  Stable/Uncomplicated    Rehab Potential  Good    PT Frequency  2x / week    PT Duration  6 weeks    PT Treatment/Interventions  ADLs/Self Care Home Management;Cryotherapy;Electrical Stimulation;Ultrasound;Traction;Moist Heat;Therapeutic exercise;Therapeutic activities    PT Next Visit Plan  nustep, general strengthening, core stability, modalities PRN for pain relief    Consulted and Agree with Plan of Care  Patient       Patient will benefit from skilled therapeutic intervention in order to improve the following deficits and impairments:  Postural dysfunction, Pain, Decreased activity tolerance, Difficulty walking, Decreased strength  Visit Diagnosis: Chronic bilateral low back pain without sciatica  Abnormal posture     Problem List Patient Active Problem List   Diagnosis Date Noted  . PUD (peptic ulcer disease) 05/09/2019  . OAB (overactive bladder) 05/09/2019  . Lumbar back pain 05/09/2019  . DDD (degenerative disc disease), lumbar 05/09/2019  . Status post total knee replacement, 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 02/11/2018  . Iron deficiency anemia 04/28/2017  . Vitamin D deficiency 04/28/2017  . Osteopenia 04/28/2017  . Gastritis, erosive   . Abdominal pain, epigastric 12/12/2016  . Colon cancer screening 11/03/2016  . Essential hypertension 11/03/2016  . Primary osteoarthritis of both knees 11/03/2016  . GERD  (gastroesophageal reflux disease) 11/03/2016  . Age-related osteoporosis without current pathological fracture 11/03/2016  . Perforation of sigmoid colon ([place]) 06/29/2016    [place] Gustafson P, PTA 06/01/2019, 10:55 AM  [facility] [place], [place], [address] Phone: [phone]   Fax:  [phone]  Name: JACKELYNE SAYER MRN: [id number] Date of Birth: [birth year]

## 2019-06-06 ENCOUNTER — Other Ambulatory Visit: Payer: Self-pay

## 2019-06-06 ENCOUNTER — Ambulatory Visit: Payer: Medicare HMO | Attending: Physician Assistant | Admitting: *Deleted

## 2019-06-06 ENCOUNTER — Encounter: Payer: Self-pay | Admitting: *Deleted

## 2019-06-06 DIAGNOSIS — M6281 Muscle weakness (generalized): Secondary | ICD-10-CM | POA: Diagnosis not present

## 2019-06-06 DIAGNOSIS — R600 Localized edema: Secondary | ICD-10-CM | POA: Diagnosis not present

## 2019-06-06 DIAGNOSIS — M545 Low back pain: Secondary | ICD-10-CM | POA: Diagnosis not present

## 2019-06-06 DIAGNOSIS — M25662 Stiffness of left knee, not elsewhere classified: Secondary | ICD-10-CM | POA: Diagnosis not present

## 2019-06-06 DIAGNOSIS — R293 Abnormal posture: Secondary | ICD-10-CM | POA: Diagnosis not present

## 2019-06-06 DIAGNOSIS — G8929 Other chronic pain: Secondary | ICD-10-CM | POA: Diagnosis not present

## 2019-06-06 MED ORDER — DENOSUMAB 60 MG/ML ~~LOC~~ SOSY: PREFILLED_SYRINGE | SUBCUTANEOUS | 0 refills | Status: DC

## 2019-06-06 NOTE — Therapy (Signed)
[facility] [address] [place], [place], [address] Phone: [phone]   Fax:  [phone]  Physical Therapy Treatment  Patient Details  Name: Tiffany Bradley MRN: [id number] Date of Birth: 1945-12-24 Referring Provider (PT): Prudy Feeler, PA-C   Encounter Date: 06/06/2019  PT End of Session - 06/06/19 1421    Visit Number  8    Number of Visits  12    Date for PT Re-Evaluation  06/29/19    Authorization Type  FOTO; progress note every 10th visit; KX modifier at 15th visit    PT Start Time  0953    PT Stop Time  1037    PT Time Calculation (min)  44 min       Past Medical History:  Diagnosis Date  . Arthritis   . Back pain   . Cataract   . DJD (degenerative joint disease)   . Full dentures   . GERD (gastroesophageal reflux disease)   . Glaucoma   . History of bleeding ulcers    patient reports multiple hospitalizations remotely  . Hypertension   . Wears glasses     Past Surgical History:  Procedure Laterality Date  . BREAST REDUCTION SURGERY Bilateral 04/25/2013   Procedure: MAMMARY REDUCTION  (BREAST);  Surgeon: Louisa Second, MD;  Location: [facility];  Service: Plastics;  Laterality: Bilateral;  . COLONOSCOPY     2008, normal per patient  . COLONOSCOPY N/A 01/16/2017   Procedure: COLONOSCOPY;  Surgeon: West Bali, MD;  [facility];  Service: Endoscopy;  Laterality: N/A;  830   . COSMETIC SURGERY    . ESOPHAGOGASTRODUODENOSCOPY N/A 01/16/2017   Procedure: ESOPHAGOGASTRODUODENOSCOPY (EGD);  Surgeon: West Bali, MD;  [facility];  Service: Endoscopy;  Laterality: N/A;  . PARTIAL COLECTOMY N/A 06/29/2016   Procedure: PARTIAL COLECTOMY;  Surgeon: Ancil Linsey, MD;  [facility];  Service: General;  Laterality: N/A;  . 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   Procedure: TOTAL KNEE ARTHROPLASTY;  Surgeon: Vickki Hearing, MD;  [facility];  Service: Orthopedics;   Laterality: Left;  [place]   . TUBAL LIGATION      There were no vitals filed for this visit.  Subjective Assessment - 06/06/19 0954    Subjective  COVID 19 screening performed on patient upon arrival.  Did ok after last RX    Pertinent History  L TKA, HTN, Osteoporosis    Currently in Pain?  Yes    Pain Score  6     Pain Location  Back    Pain Orientation  Lower    Pain Descriptors / Indicators  Sore;Discomfort    Pain Onset  More than a month ago                       [facility] Adult PT Treatment/Exercise - 06/06/19 0001      Lumbar Exercises: Aerobic   Nustep  L5 x15 min UE/LE activity, monitored      Lumbar Exercises: Machines for Strengthening   Cybex Lumbar Extension  50# x20 reps      Lumbar Exercises: Standing   Scapular Retraction  Strengthening;20 reps    Scapular Retraction Limitations  green XTS    Shoulder Extension  Strengthening;Both;20 reps;Limitations    Shoulder Extension Limitations  greenXTS      Lumbar Exercises: Supine   Ab Set  10 reps;5 seconds    Bent Knee Raise  20  reps      Moist Heat Therapy   Number Minutes Moist Heat  15 Minutes    Moist Heat Location  Lumbar Spine      Electrical Stimulation   Electrical Stimulation Location  B low back    Electrical Stimulation Action  premod x15 mins 80-150hz     Electrical Stimulation Goals  Pain                  PT Long Term Goals - 06/01/19 1036      PT LONG TERM GOAL #1   Title  Patient will be independent with HEP.    Time  6    Period  Weeks    Status  Achieved      PT LONG TERM GOAL #2   Title  Patient will report ability to perform ADLs and yark work with low back pain less than 3/10.    Time  6    Period  Weeks    Status  On-going   4/10 and up at times 06/01/19     PT LONG TERM GOAL #3   Title  Patient will report ability to stand for greater than 15 minutes to perform house work and shower activities.    Time  6    Period  Weeks    Status  On-going   97min  at this time 06/01/19           Plan - 06/06/19 1424    Clinical Impression Statement  Pt arrived today doing fairly well with LB,b ut mainly sore and stiff. She was able to complete all therex for core strengtheng and activtion. She feels that PT is helping and reports decreased symptoms after sessions    PT Frequency  2x / week    PT Duration  6 weeks    PT Treatment/Interventions  ADLs/Self Care Home Management;Cryotherapy;Electrical Stimulation;Ultrasound;Traction;Moist Heat;Therapeutic exercise;Therapeutic activities    PT Next Visit Plan  nustep, general strengthening, core stability, modalities PRN for pain relief    PT Home Exercise Plan  see patient education sectoin       Patient will benefit from skilled therapeutic intervention in order to improve the following deficits and impairments:     Visit Diagnosis: Chronic bilateral low back pain without sciatica  Abnormal posture     Problem List Patient Active Problem List   Diagnosis Date Noted  . PUD (peptic ulcer disease) 05/09/2019  . OAB (overactive bladder) 05/09/2019  . Lumbar back pain 05/09/2019  . DDD (degenerative disc disease), lumbar 05/09/2019  . Status post total knee replacement, 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 02/11/2018  . Iron deficiency anemia 04/28/2017  . Vitamin D deficiency 04/28/2017  . Osteopenia 04/28/2017  . Gastritis, erosive   . Abdominal pain, epigastric 12/12/2016  . Colon cancer screening 11/03/2016  . Essential hypertension 11/03/2016  . Primary osteoarthritis of both knees 11/03/2016  . GERD (gastroesophageal reflux disease) 11/03/2016  . Age-related osteoporosis without current pathological fracture 11/03/2016  . Perforation of sigmoid colon ([place]) 06/29/2016    Jaedan Schuman,CHRIS, PTA 06/06/2019, 2:31 PM  [facility] [address] [place], [place], [address] Phone: [phone]   Fax:  [phone]  Name: Tiffany Bradley MRN: [id number] Date of Birth:  1945/10/16

## 2019-06-06 NOTE — Telephone Encounter (Signed)
Order Prolia for injection [employer]

## 2019-06-08 ENCOUNTER — Ambulatory Visit: Payer: Medicare HMO | Admitting: Physical Therapy

## 2019-06-08 ENCOUNTER — Other Ambulatory Visit: Payer: Self-pay

## 2019-06-08 DIAGNOSIS — M25662 Stiffness of left knee, not elsewhere classified: Secondary | ICD-10-CM | POA: Diagnosis not present

## 2019-06-08 DIAGNOSIS — M545 Low back pain: Secondary | ICD-10-CM | POA: Diagnosis not present

## 2019-06-08 DIAGNOSIS — R293 Abnormal posture: Secondary | ICD-10-CM | POA: Diagnosis not present

## 2019-06-08 DIAGNOSIS — M6281 Muscle weakness (generalized): Secondary | ICD-10-CM | POA: Diagnosis not present

## 2019-06-08 DIAGNOSIS — G8929 Other chronic pain: Secondary | ICD-10-CM | POA: Diagnosis not present

## 2019-06-08 DIAGNOSIS — R600 Localized edema: Secondary | ICD-10-CM | POA: Diagnosis not present

## 2019-06-08 NOTE — Therapy (Signed)
[facility] [place], [place], [address] Phone: [phone]   Fax:  [phone]  Physical Therapy Treatment  Patient Details  Name: Tiffany Bradley MRN: [id number] Date of Birth: 17-Apr-1946 Referring Provider (PT): Particia Nearing, PA-C   Encounter Date: 06/08/2019  PT End of Session - 06/08/19 1013    Visit Number  8    Number of Visits  12    Date for PT Re-Evaluation  06/29/19    Authorization Type  FOTO; progress note every 10th visit; KX modifier at 15th visit    PT Start Time  0954    PT Stop Time  1043    PT Time Calculation (min)  49 min    Activity Tolerance  Patient tolerated treatment well    Behavior During Therapy  [facility] for tasks assessed/performed       Past Medical History:  Diagnosis Date  . Arthritis   . Back pain   . Cataract   . DJD (degenerative joint disease)   . Full dentures   . GERD (gastroesophageal reflux disease)   . Glaucoma   . History of bleeding ulcers    patient reports multiple hospitalizations remotely  . Hypertension   . Wears glasses     Past Surgical History:  Procedure Laterality Date  . BREAST REDUCTION SURGERY Bilateral 04/25/2013   Procedure: MAMMARY REDUCTION  (BREAST);  Surgeon: Cristine Polio, MD;  Location: [place];  Service: Plastics;  Laterality: Bilateral;  . COLONOSCOPY     2008, normal per patient  . COLONOSCOPY N/A 01/16/2017   Procedure: COLONOSCOPY;  Surgeon: Danie Binder, MD;  [facility];  Service: Endoscopy;  Laterality: N/A;  830   . COSMETIC SURGERY    . ESOPHAGOGASTRODUODENOSCOPY N/A 01/16/2017   Procedure: ESOPHAGOGASTRODUODENOSCOPY (EGD);  Surgeon: Danie Binder, MD;  [facility];  Service: Endoscopy;  Laterality: N/A;  . PARTIAL COLECTOMY N/A 06/29/2016   Procedure: PARTIAL COLECTOMY;  Surgeon: Vickie Epley, MD;  [facility];  Service: General;  Laterality: N/A;  . TOTAL KNEE ARTHROPLASTY Left  02/11/2018   Procedure: TOTAL KNEE ARTHROPLASTY;  Surgeon: Carole Civil, MD;  [facility];  Service: Orthopedics;  Laterality: Left;  DeP[place]   . TUBAL LIGATION      There were no vitals filed for this visit.  Subjective Assessment - 06/08/19 0955    Subjective  COVID 19 screening performed on patient upon arrival.  Patient arrived with minimal pain today    Pertinent History  L TKA, HTN, Osteoporosis    Currently in Pain?  Yes    Pain Score  3     Pain Location  Back    Pain Orientation  Lower    Pain Descriptors / Indicators  Sore    Pain Type  Chronic pain    Pain Onset  More than a month ago    Pain Frequency  Intermittent    Aggravating Factors   prolong walking or standing activty    Pain Relieving Factors  at rest                       [facility] Adult PT Treatment/Exercise - 06/08/19 0001      Lumbar Exercises: Aerobic   Nustep  L5 x15 min UE/LE activity, monitored      Lumbar Exercises: Machines for Strengthening   Cybex Lumbar Extension  50# x20 reps      Lumbar  Exercises: Standing   Scapular Retraction  Strengthening;20 reps    Scapular Retraction Limitations  green XTS    Shoulder Extension  Strengthening;Both;20 reps;Limitations    Shoulder Extension Limitations  green XTS      Lumbar Exercises: Supine   Bent Knee Raise  20 reps    Bridge with clamshell  20 reps    Bridge with Ball Squeeze Limitations  red t-band    Straight Leg Raise  2 seconds   2x10     Moist Heat Therapy   Number Minutes Moist Heat  15 Minutes    Moist Heat Location  Lumbar Spine      Electrical Stimulation   Electrical Stimulation Location  B low back    Electrical Stimulation Action  premod    Electrical Stimulation Parameters  80-150hz x79mn    Electrical Stimulation Goals  Pain                  PT Long Term Goals - 06/08/19 1015      PT LONG TERM GOAL #1   Title  Patient will be independent with HEP.    Time  6    Period  Weeks    Status   Achieved      PT LONG TERM GOAL #2   Title  Patient will report ability to perform ADLs and yark work with low back pain less than 3/10.    Time  6    Period  Weeks    Status  On-going   pain up to 5-8/10 at times 06/08/19     PT LONG TERM GOAL #3   Title  Patient will report ability to stand for greater than 15 minutes to perform house work and shower activities.    Time  6    Period  Weeks    Status  Achieved   able to perform 15+min 06/08/19           Plan - 06/08/19 1023    Clinical Impression Statement  Patient arrived with decreased pain today. Patient has reported doing well with treatments and is able to perform her ADL's with greater ease. Patient can perform house work with less dicomfort and no limitations. Patient has not been able to complete her yard work due to increased discomfort with it. Patient met LTG #3 today with other goal ongoing.    Examination-Activity Limitations  Reach Overhead    Stability/Clinical Decision Making  Stable/Uncomplicated    Rehab Potential  Good    PT Frequency  2x / week    PT Duration  6 weeks    PT Treatment/Interventions  ADLs/Self Care Home Management;Cryotherapy;Electrical Stimulation;Ultrasound;Traction;Moist Heat;Therapeutic exercise;Therapeutic activities    PT Next Visit Plan  cont with nustep, general strengthening, core stability, modalities PRN for pain relief    Consulted and Agree with Plan of Care  Patient       Patient will benefit from skilled therapeutic intervention in order to improve the following deficits and impairments:  Postural dysfunction, Pain, Decreased activity tolerance, Difficulty walking, Decreased strength  Visit Diagnosis: Chronic bilateral low back pain without sciatica  Abnormal posture     Problem List Patient Active Problem List   Diagnosis Date Noted  . PUD (peptic ulcer disease) 05/09/2019  . OAB (overactive bladder) 05/09/2019  . Lumbar back pain 05/09/2019  . DDD (degenerative  disc disease), lumbar 05/09/2019  . Status post total knee replacement, 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 02/11/2018  . Iron deficiency anemia 04/28/2017  . Vitamin  D deficiency 04/28/2017  . Osteopenia 04/28/2017  . Gastritis, erosive   . Abdominal pain, epigastric 12/12/2016  . Colon cancer screening 11/03/2016  . Essential hypertension 11/03/2016  . Primary osteoarthritis of both knees 11/03/2016  . GERD (gastroesophageal reflux disease) 11/03/2016  . Age-related osteoporosis without current pathological fracture 11/03/2016  . Perforation of sigmoid colon ([place]) 06/29/2016    DUNFORD, CHRISTINA P, PTA 06/08/2019, 10:47 AM  [facility] [place], [place], [address] Phone: [phone]   Fax:  [phone]  Name: Tiffany Bradley MRN: [id number] Date of Birth: 1946/08/19

## 2019-06-13 ENCOUNTER — Encounter: Payer: Self-pay | Admitting: Physical Therapy

## 2019-06-13 ENCOUNTER — Other Ambulatory Visit: Payer: Self-pay

## 2019-06-13 ENCOUNTER — Ambulatory Visit: Payer: Medicare HMO | Admitting: Physical Therapy

## 2019-06-13 DIAGNOSIS — M25662 Stiffness of left knee, not elsewhere classified: Secondary | ICD-10-CM | POA: Diagnosis not present

## 2019-06-13 DIAGNOSIS — M6281 Muscle weakness (generalized): Secondary | ICD-10-CM | POA: Diagnosis not present

## 2019-06-13 DIAGNOSIS — M545 Low back pain, unspecified: Secondary | ICD-10-CM

## 2019-06-13 DIAGNOSIS — R293 Abnormal posture: Secondary | ICD-10-CM

## 2019-06-13 DIAGNOSIS — R600 Localized edema: Secondary | ICD-10-CM | POA: Diagnosis not present

## 2019-06-13 DIAGNOSIS — G8929 Other chronic pain: Secondary | ICD-10-CM | POA: Diagnosis not present

## 2019-06-13 NOTE — Therapy (Signed)
[facility] [address] [place], [place], [address] Phone: [phone]   Fax:  [phone]  Physical Therapy Treatment  Patient Details  Name: Tiffany Bradley MRN: [id number] Date of Birth: 04-01-1946 Referring Provider (PT): Prudy Feeler, PA-C   Encounter Date: 06/13/2019  PT End of Session - 06/13/19 1026    Visit Number  9    Number of Visits  12    Date for PT Re-Evaluation  06/29/19    Authorization Type  FOTO; progress note every 10th visit; KX modifier at 15th visit    PT Start Time  0951    PT Stop Time  1041    PT Time Calculation (min)  50 min    Activity Tolerance  Patient tolerated treatment well    Behavior During Therapy  [facility] for tasks assessed/performed       Past Medical History:  Diagnosis Date  . Arthritis   . Back pain   . Cataract   . DJD (degenerative joint disease)   . Full dentures   . GERD (gastroesophageal reflux disease)   . Glaucoma   . History of bleeding ulcers    patient reports multiple hospitalizations remotely  . Hypertension   . Wears glasses     Past Surgical History:  Procedure Laterality Date  . BREAST REDUCTION SURGERY Bilateral 04/25/2013   Procedure: MAMMARY REDUCTION  (BREAST);  Surgeon: Louisa Second, MD;  Location: [facility];  Service: Plastics;  Laterality: Bilateral;  . COLONOSCOPY     2008, normal per patient  . COLONOSCOPY N/A 01/16/2017   Procedure: COLONOSCOPY;  Surgeon: West Bali, MD;  [facility];  Service: Endoscopy;  Laterality: N/A;  830   . COSMETIC SURGERY    . ESOPHAGOGASTRODUODENOSCOPY N/A 01/16/2017   Procedure: ESOPHAGOGASTRODUODENOSCOPY (EGD);  Surgeon: West Bali, MD;  [facility];  Service: Endoscopy;  Laterality: N/A;  . PARTIAL COLECTOMY N/A 06/29/2016   Procedure: PARTIAL COLECTOMY;  Surgeon: Ancil Linsey, MD;  Location: [facility];  Service: General;  Laterality: N/A;  . TOTAL KNEE ARTHROPLASTY Left  02/11/2018   Procedure: TOTAL KNEE ARTHROPLASTY;  Surgeon: Vickki Hearing, MD;  Location: [facility];  Service: Orthopedics;  Laterality: Left;  DePuy   . TUBAL LIGATION      There were no vitals filed for this visit.  Subjective Assessment - 06/13/19 0953    Subjective  COVID 19 screening performed on patient upon arrival.  Patient arrived and reported some discomfort    Pertinent History  L TKA, HTN, Osteoporosis    Currently in Pain?  Yes    Pain Score  4     Pain Location  Back    Pain Orientation  Lower    Pain Descriptors / Indicators  Discomfort;Sore    Pain Type  Chronic pain    Pain Onset  More than a month ago    Pain Frequency  Intermittent    Aggravating Factors   prolong walking    Pain Relieving Factors  at rest                       [facility] Adult PT Treatment/Exercise - 06/13/19 0001      Lumbar Exercises: Aerobic   Nustep  L5 x15 min UE/LE activity, monitored      Lumbar Exercises: Machines for Strengthening   Cybex Lumbar Extension  50# x20 reps      Lumbar Exercises: Standing  Scapular Retraction  Strengthening;20 reps    Scapular Retraction Limitations  green XTS    Shoulder Extension  Strengthening;Both;20 reps;Limitations    Shoulder Extension Limitations  green xts      Lumbar Exercises: Supine   Bent Knee Raise  20 reps      Moist Heat Therapy   Number Minutes Moist Heat  15 Minutes    Moist Heat Location  Lumbar Spine      Electrical Stimulation   Electrical Stimulation Location  B low back    Electrical Stimulation Action  premod    Electrical Stimulation Parameters  80-150hz  x6min    Electrical Stimulation Goals  Pain                  PT Long Term Goals - 06/13/19 1027      PT LONG TERM GOAL #1   Title  Patient will be independent with HEP.    Time  6    Period  Weeks    Status  Achieved      PT LONG TERM GOAL #2   Title  Patient will report ability to perform ADLs and yark work with low back pain less  than 3/10.    Time  6    Period  Weeks    Status  On-going   5/10 and above 06/13/19     PT LONG TERM GOAL #3   Title  Patient will report ability to stand for greater than 15 minutes to perform house work and shower activities.    Period  Weeks    Status  Achieved            Plan - 06/13/19 1028    Clinical Impression Statement  Patient tolerated treatment well today. patient has ongoing discomfort with yard work yet pain has improved overall. Patient doing well with consistancy of core exercises. Patient remaining goal ongoing.    Examination-Activity Limitations  Reach Overhead    Stability/Clinical Decision Making  Stable/Uncomplicated    Rehab Potential  Good    PT Frequency  2x / week    PT Duration  6 weeks    PT Treatment/Interventions  ADLs/Self Care Home Management;Cryotherapy;Electrical Stimulation;Ultrasound;Traction;Moist Heat;Therapeutic exercise;Therapeutic activities    PT Next Visit Plan  FOTO next treatment and cont with general strengthening, core stability, modalities PRN for pain relief    Consulted and Agree with Plan of Care  Patient       Patient will benefit from skilled therapeutic intervention in order to improve the following deficits and impairments:  Postural dysfunction, Pain, Decreased activity tolerance, Difficulty walking, Decreased strength  Visit Diagnosis: Abnormal posture  Chronic bilateral low back pain without sciatica     Problem List Patient Active Problem List   Diagnosis Date Noted  . PUD (peptic ulcer disease) 05/09/2019  . OAB (overactive bladder) 05/09/2019  . Lumbar back pain 05/09/2019  . DDD (degenerative disc disease), lumbar 05/09/2019  . Status post total knee replacement, 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 02/11/2018  . Iron deficiency anemia 04/28/2017  . Vitamin D deficiency 04/28/2017  . Osteopenia 04/28/2017  . Gastritis, erosive   . Abdominal pain, epigastric 12/12/2016  . Colon cancer screening 11/03/2016  . Essential  hypertension 11/03/2016  . Primary osteoarthritis of both knees 11/03/2016  . GERD (gastroesophageal reflux disease) 11/03/2016  . Age-related osteoporosis without current pathological fracture 11/03/2016  . Perforation of sigmoid colon ([place]) 06/29/2016    Treson Laura P, PTA 06/13/2019, 10:58 AM  [facility]  [address] [place], [place], [address] Phone: [phone]   Fax:  [phone]  Name: Anthony Sarva H Karn MRN: [id number] Date of Birth: 02/07/1946

## 2019-06-15 ENCOUNTER — Other Ambulatory Visit: Payer: Self-pay

## 2019-06-15 ENCOUNTER — Encounter: Payer: Self-pay | Admitting: Physical Therapy

## 2019-06-15 ENCOUNTER — Ambulatory Visit: Payer: Medicare HMO | Admitting: Physical Therapy

## 2019-06-15 DIAGNOSIS — G8929 Other chronic pain: Secondary | ICD-10-CM | POA: Diagnosis not present

## 2019-06-15 DIAGNOSIS — R293 Abnormal posture: Secondary | ICD-10-CM

## 2019-06-15 DIAGNOSIS — M25662 Stiffness of left knee, not elsewhere classified: Secondary | ICD-10-CM | POA: Diagnosis not present

## 2019-06-15 DIAGNOSIS — R600 Localized edema: Secondary | ICD-10-CM | POA: Diagnosis not present

## 2019-06-15 DIAGNOSIS — M6281 Muscle weakness (generalized): Secondary | ICD-10-CM | POA: Diagnosis not present

## 2019-06-15 DIAGNOSIS — M545 Low back pain: Secondary | ICD-10-CM | POA: Diagnosis not present

## 2019-06-15 NOTE — Therapy (Signed)
[facility] [address] [place], [place], [address] Phone: [phone]   Fax:  [phone]  Physical Therapy Treatment  Patient Details  Name: Tiffany Bradley MRN: [id number] Date of Birth: 08/03/1946 Referring Provider (PT): Prudy FeelerAngel Jones, PA-C   Encounter Date: 06/15/2019  PT End of Session - 06/15/19 1009    Visit Number  10    Number of Visits  12    Date for PT Re-Evaluation  06/29/19    Authorization Type  FOTO; progress note every 10th visit; KX modifier at 15th visit    PT Start Time  0948    PT Stop Time  1037    PT Time Calculation (min)  49 min    Activity Tolerance  Patient tolerated treatment well    Behavior During Therapy  [facility] for tasks assessed/performed       Past Medical History:  Diagnosis Date  . Arthritis   . Back pain   . Cataract   . DJD (degenerative joint disease)   . Full dentures   . GERD (gastroesophageal reflux disease)   . Glaucoma   . History of bleeding ulcers    patient reports multiple hospitalizations remotely  . Hypertension   . Wears glasses     Past Surgical History:  Procedure Laterality Date  . BREAST REDUCTION SURGERY Bilateral 04/25/2013   Procedure: MAMMARY REDUCTION  (BREAST);  Surgeon: Louisa SecondGerald Truesdale, MD;  Location: [facility];  Service: Plastics;  Laterality: Bilateral;  . COLONOSCOPY     2008, normal per patient  . COLONOSCOPY N/A 01/16/2017   Procedure: COLONOSCOPY;  Surgeon: West BaliFields, Sandi L, MD;  [facility];  Service: Endoscopy;  Laterality: N/A;  830   . COSMETIC SURGERY    . ESOPHAGOGASTRODUODENOSCOPY N/A 01/16/2017   Procedure: ESOPHAGOGASTRODUODENOSCOPY (EGD);  Surgeon: West BaliFields, Sandi L, MD;  [facility];  Service: Endoscopy;  Laterality: N/A;  . PARTIAL COLECTOMY N/A 06/29/2016   Procedure: PARTIAL COLECTOMY;  Surgeon: Ancil LinseyJason Evan Davis, MD;  Location: [facility];  Service: General;  Laterality: N/A;  . TOTAL KNEE ARTHROPLASTY Left  02/11/2018   Procedure: TOTAL KNEE ARTHROPLASTY;  Surgeon: Vickki HearingHarrison, Stanley E, MD;  Location: [facility];  Service: Orthopedics;  Laterality: Left;  DePuy   . TUBAL LIGATION      There were no vitals filed for this visit.  Subjective Assessment - 06/15/19 0951    Subjective  COVID 19 screening performed on patient upon arrival.  Patient reported no new complaints    Pertinent History  L TKA, HTN, Osteoporosis    Currently in Pain?  Yes    Pain Score  3     Pain Location  Back    Pain Orientation  Lower    Pain Descriptors / Indicators  Discomfort    Pain Type  Chronic pain    Pain Onset  More than a month ago    Pain Frequency  Intermittent    Aggravating Factors   prolong standing or walking    Pain Relieving Factors  at rest                       [facility] Adult PT Treatment/Exercise - 06/15/19 0001      Lumbar Exercises: Aerobic   Stationary Bike  L2 x7510min      Lumbar Exercises: Machines for Strengthening   Cybex Lumbar Extension  50# x30 reps      Lumbar Exercises: Standing   Scapular  Retraction  Strengthening;20 reps    Scapular Retraction Limitations  green XTS    Shoulder Extension  Strengthening;Both;20 reps;Limitations    Shoulder Extension Limitations  green XTS    Other Standing Lumbar Exercises  green XTS for diagnols 2x10 each      Lumbar Exercises: Supine   Bent Knee Raise  20 reps    Bridge with clamshell  20 reps    Bridge with Ball Squeeze Limitations  red t-band    Straight Leg Raise  20 reps;3 seconds      Moist Heat Therapy   Number Minutes Moist Heat  15 Minutes    Moist Heat Location  Lumbar Spine      Electrical Stimulation   Electrical Stimulation Location  B low back    Electrical Stimulation Action  premod    Electrical Stimulation Parameters  80-150hz  x67min    Electrical Stimulation Goals  Pain                  PT Long Term Goals - 06/13/19 1027      PT LONG TERM GOAL #1   Title  Patient will be independent with  HEP.    Time  6    Period  Weeks    Status  Achieved      PT LONG TERM GOAL #2   Title  Patient will report ability to perform ADLs and yark work with low back pain less than 3/10.    Time  6    Period  Weeks    Status  On-going   5/10 and above 06/13/19     PT LONG TERM GOAL #3   Title  Patient will report ability to stand for greater than 15 minutes to perform house work and shower activities.    Period  Weeks    Status  Achieved            Plan - 06/15/19 1010    Clinical Impression Statement  Patient tolerated treatment well today and progressing with all activities. Patient FOTO score improved and patient feels 80% improved overall. Patient able to perfrom yard work with greater ease as of yesterday she was able to [employer] yard and rake leaves. Patient progressing toward remaining goal.    Examination-Activity Limitations  Reach Overhead    Stability/Clinical Decision Making  Stable/Uncomplicated    Rehab Potential  Good    PT Frequency  2x / week    PT Duration  6 weeks    PT Treatment/Interventions  ADLs/Self Care Home Management;Cryotherapy;Electrical Stimulation;Ultrasound;Traction;Moist Heat;Therapeutic exercise;Therapeutic activities    PT Next Visit Plan  cont with POC and DC after remaining visits    Consulted and Agree with Plan of Care  Patient       Patient will benefit from skilled therapeutic intervention in order to improve the following deficits and impairments:  Postural dysfunction, Pain, Decreased activity tolerance, Difficulty walking, Decreased strength  Visit Diagnosis: Abnormal posture  Chronic bilateral low back pain without sciatica     Problem List Patient Active Problem List   Diagnosis Date Noted  . PUD (peptic ulcer disease) 05/09/2019  . OAB (overactive bladder) 05/09/2019  . Lumbar back pain 05/09/2019  . DDD (degenerative disc disease), lumbar 05/09/2019  . Status post total knee replacement, 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 02/11/2018  . Iron  deficiency anemia 04/28/2017  . Vitamin D deficiency 04/28/2017  . Osteopenia 04/28/2017  . Gastritis, erosive   . Abdominal pain, epigastric 12/12/2016  . Colon cancer screening 11/03/2016  .  Essential hypertension 11/03/2016  . Primary osteoarthritis of both knees 11/03/2016  . GERD (gastroesophageal reflux disease) 11/03/2016  . Age-related osteoporosis without current pathological fracture 11/03/2016  . Perforation of sigmoid colon ([facility]) 06/29/2016    Maurie Olesen P, PTA 06/15/2019, 10:39 AM  [facility] [address] [place], [place], [address] Phone: [phone]   Fax:  [phone]  Name: SUMAYYA MUHA MRN: [id number] Date of Birth: 04-21-1946

## 2019-06-16 ENCOUNTER — Ambulatory Visit (INDEPENDENT_AMBULATORY_CARE_PROVIDER_SITE_OTHER): Payer: Medicare HMO

## 2019-06-16 ENCOUNTER — Other Ambulatory Visit: Payer: Self-pay

## 2019-06-16 DIAGNOSIS — M810 Age-related osteoporosis without current pathological fracture: Secondary | ICD-10-CM

## 2019-06-16 MED ORDER — DENOSUMAB 60 MG/ML ~~LOC~~ SOSY
60.0000 mg | PREFILLED_SYRINGE | Freq: Once | SUBCUTANEOUS | Status: AC
  Administered 2019-06-16: 60 mg via SUBCUTANEOUS

## 2019-06-16 NOTE — Progress Notes (Signed)
Prolia injection given to left upper arm.  Patient tolerated well Provided by Roxanne Gates

## 2019-06-20 ENCOUNTER — Encounter: Payer: Self-pay | Admitting: Physical Therapy

## 2019-06-20 ENCOUNTER — Ambulatory Visit: Payer: Medicare HMO | Admitting: Physical Therapy

## 2019-06-20 ENCOUNTER — Other Ambulatory Visit: Payer: Self-pay

## 2019-06-20 DIAGNOSIS — M6281 Muscle weakness (generalized): Secondary | ICD-10-CM

## 2019-06-20 DIAGNOSIS — M25662 Stiffness of left knee, not elsewhere classified: Secondary | ICD-10-CM | POA: Diagnosis not present

## 2019-06-20 DIAGNOSIS — G8929 Other chronic pain: Secondary | ICD-10-CM | POA: Diagnosis not present

## 2019-06-20 DIAGNOSIS — R293 Abnormal posture: Secondary | ICD-10-CM

## 2019-06-20 DIAGNOSIS — R600 Localized edema: Secondary | ICD-10-CM | POA: Diagnosis not present

## 2019-06-20 DIAGNOSIS — M545 Low back pain, unspecified: Secondary | ICD-10-CM

## 2019-06-20 NOTE — Therapy (Signed)
[facility] [place], [place], [address] Phone: [phone]   Fax:  [phone]  Physical Therapy Treatment  Patient Details  Name: Tiffany Bradley MRN: [id number] Date of Birth: 27-Apr-1946 Referring Provider (PT): Particia Nearing, PA-C   Encounter Date: 06/20/2019  PT End of Session - 06/20/19 0957    Visit Number  11    Number of Visits  12    Date for PT Re-Evaluation  06/29/19    Authorization Type  FOTO; progress note every 10th visit; KX modifier at 15th visit    PT Start Time  0950    PT Stop Time  1045    PT Time Calculation (min)  55 min    Activity Tolerance  Patient tolerated treatment well    Behavior During Therapy  [facility] for tasks assessed/performed       Past Medical History:  Diagnosis Date  . Arthritis   . Back pain   . Cataract   . DJD (degenerative joint disease)   . Full dentures   . GERD (gastroesophageal reflux disease)   . Glaucoma   . History of bleeding ulcers    patient reports multiple hospitalizations remotely  . Hypertension   . Wears glasses     Past Surgical History:  Procedure Laterality Date  . BREAST REDUCTION SURGERY Bilateral 04/25/2013   Procedure: MAMMARY REDUCTION  (BREAST);  Surgeon: Cristine Polio, MD;  Location: [facility];  Service: Plastics;  Laterality: Bilateral;  . COLONOSCOPY     2008, normal per patient  . COLONOSCOPY N/A 01/16/2017   Procedure: COLONOSCOPY;  Surgeon: Danie Binder, MD;  [facility];  Service: Endoscopy;  Laterality: N/A;  830   . COSMETIC SURGERY    . ESOPHAGOGASTRODUODENOSCOPY N/A 01/16/2017   Procedure: ESOPHAGOGASTRODUODENOSCOPY (EGD);  Surgeon: Danie Binder, MD;  [facility];  Service: Endoscopy;  Laterality: N/A;  . PARTIAL COLECTOMY N/A 06/29/2016   Procedure: PARTIAL COLECTOMY;  Surgeon: Vickie Epley, MD;  [facility];  Service: General;  Laterality: N/A;  . TOTAL KNEE ARTHROPLASTY Left  02/11/2018   Procedure: TOTAL KNEE ARTHROPLASTY;  Surgeon: Carole Civil, MD;  [facility];  Service: Orthopedics;  Laterality: Left;  DePuy   . TUBAL LIGATION      There were no vitals filed for this visit.  Subjective Assessment - 06/20/19 0954    Subjective  COVID 19 screening performed on patient upon arrival.  Patient reporting no pain upon arrival. Pt reporting she has been rushing around this morning and hasn't had time to to think about it.    Currently in Pain?  No/denies                       [facility] Adult PT Treatment/Exercise - 06/20/19 0001      Lumbar Exercises: Aerobic   Stationary Bike  --    Nustep  L5 10 minutes      Lumbar Exercises: Machines for Strengthening   Cybex Lumbar Extension  50# x30 reps   40# x 10 warm up     Lumbar Exercises: Standing   Scapular Retraction  Strengthening;20 reps    Scapular Retraction Limitations  green XTS    Shoulder Extension  Strengthening;Both;20 reps;Limitations    Shoulder Extension Limitations  green XTS    Other Standing Lumbar Exercises  green XTS for diagnols 2x10 each      Lumbar Exercises: Supine   Bent  Knee Raise  20 reps    Dead Bug  10 reps;2 seconds    Dead Bug Limitations  modified letting feet touch the mat table    Bridge with clamshell  20 reps    Bridge with [employer] Limitations  green theraband    Straight Leg Raise  20 reps;3 seconds      Lumbar Exercises: Sidelying   Hip Abduction  Both;20 reps;3 seconds      Moist Heat Therapy   Number Minutes Moist Heat  15 Minutes    Moist Heat Location  Lumbar Spine      Electrical Stimulation   Electrical Stimulation Location  Bilateral Lumbar spine    Electrical Stimulation Action  pre-mod    Electrical Stimulation Parameters  80-150 Hz x 15 minutes    Electrical Stimulation Goals  Pain             PT Education - 06/20/19 0955    Education Details  Reviwed pt's HEP: SKTC, DKTC, marching, hip abd/add using ball and  green theraband    Person(s) Educated  Patient    Methods  Explanation;Demonstration    Comprehension  Verbalized understanding          PT Long Term Goals - 06/20/19 0957      PT LONG TERM GOAL #1   Title  Patient will be independent with HEP.    Baseline  Pt able to verbally review and demonstrate her HEP    Time  6    Period  Weeks    Status  Achieved      PT LONG TERM GOAL #2   Title  Patient will report ability to perform ADLs and yark work with low back pain less than 3/10.    Baseline  Pt reporting she is able to do yard work with pain around 2-3/10.    Time  6    Period  Weeks    Status  Achieved      PT LONG TERM GOAL #3   Title  Patient will report ability to stand for greater than 15 minutes to perform house work and shower activities.    Baseline  Pt able to perform household chores for about 1 hour before having to take a break.    Time  6    Period  Weeks    Status  Achieved            Plan - 06/20/19 1010    Clinical Impression Statement  Pt tolerating all exercises well. Pt reporting cramps in bed at night and in the morning. Pt instructed in streching and hydration. Pt has met her goals set. One more visit scheduled on 06/22/2019 for updating pt's HEP and discharge. Pt in agreement with discharge plan.    Stability/Clinical Decision Making  Stable/Uncomplicated    Rehab Potential  Good    PT Frequency  2x / week    PT Duration  6 weeks    PT Treatment/Interventions  ADLs/Self Care Home Management;Cryotherapy;Electrical Stimulation;Ultrasound;Traction;Moist Heat;Therapeutic exercise;Therapeutic activities    PT Next Visit Plan  Discharge at next visit.    PT Home Exercise Plan  see pt instructions/edu section    Consulted and Agree with Plan of Care  Patient       Patient will benefit from skilled therapeutic intervention in order to improve the following deficits and impairments:  Postural dysfunction, Pain, Decreased activity tolerance,  Difficulty walking, Decreased strength  Visit Diagnosis: Abnormal posture  Chronic bilateral low  back pain without sciatica  Stiffness of left knee, not elsewhere classified  Muscle weakness (generalized)  Localized edema     Problem List Patient Active Problem List   Diagnosis Date Noted  . PUD (peptic ulcer disease) 05/09/2019  . OAB (overactive bladder) 05/09/2019  . Lumbar back pain 05/09/2019  . DDD (degenerative disc disease), lumbar 05/09/2019  . Status post total knee replacement, 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 02/11/2018  . Iron deficiency anemia 04/28/2017  . Vitamin D deficiency 04/28/2017  . Osteopenia 04/28/2017  . Gastritis, erosive   . Abdominal pain, epigastric 12/12/2016  . Colon cancer screening 11/03/2016  . Essential hypertension 11/03/2016  . Primary osteoarthritis of both knees 11/03/2016  . GERD (gastroesophageal reflux disease) 11/03/2016  . Age-related osteoporosis without current pathological fracture 11/03/2016  . Perforation of sigmoid colon ([place]) 06/29/2016    Oretha Caprice, PT 06/20/2019, 10:37 AM  [facility] [address] [place], [place], [address] Phone: [phone]   Fax:  [phone]  Name: LADAJA YUSUPOV MRN: [id number] Date of Birth: 02-Sep-1945

## 2019-06-22 ENCOUNTER — Ambulatory Visit: Payer: Medicare HMO | Admitting: Physical Therapy

## 2019-06-22 ENCOUNTER — Other Ambulatory Visit: Payer: Self-pay

## 2019-06-22 ENCOUNTER — Encounter: Payer: Self-pay | Admitting: Physical Therapy

## 2019-06-22 DIAGNOSIS — M25662 Stiffness of left knee, not elsewhere classified: Secondary | ICD-10-CM | POA: Diagnosis not present

## 2019-06-22 DIAGNOSIS — R293 Abnormal posture: Secondary | ICD-10-CM

## 2019-06-22 DIAGNOSIS — R600 Localized edema: Secondary | ICD-10-CM | POA: Diagnosis not present

## 2019-06-22 DIAGNOSIS — G8929 Other chronic pain: Secondary | ICD-10-CM | POA: Diagnosis not present

## 2019-06-22 DIAGNOSIS — M545 Low back pain: Secondary | ICD-10-CM | POA: Diagnosis not present

## 2019-06-22 DIAGNOSIS — M6281 Muscle weakness (generalized): Secondary | ICD-10-CM | POA: Diagnosis not present

## 2019-06-22 NOTE — Therapy (Signed)
[facility] [place], [place], [address] Phone: [phone]   Fax:  [phone]  Physical Therapy Treatment PHYSICAL THERAPY DISCHARGE SUMMARY  Visits from Start of Care: 12  Current functional level related to goals / functional outcomes: See below   Remaining deficits: See goals   Education / Equipment: HEP Plan: Patient agrees to discharge.  Patient goals were met. Patient is being discharged due to meeting the stated rehab goals.  ?????  Gabriela Eves, PT, DPT 06/22/19    Patient Details  Name: Tiffany Bradley MRN: [id number] Date of Birth: [birth year] Referring Provider (PT): Particia Nearing, PA-C   Encounter Date: 06/22/2019  PT End of Session - 06/22/19 0950    Visit Number  12    Number of Visits  12    Date for PT Re-Evaluation  06/29/19    Authorization Type  FOTO; progress note every 10th visit; KX modifier at 15th visit    PT Start Time  0947    PT Stop Time  1032    PT Time Calculation (min)  45 min    Activity Tolerance  Patient tolerated treatment well    Behavior During Therapy  [facility] for tasks assessed/performed       Past Medical History:  Diagnosis Date  . Arthritis   . Back pain   . Cataract   . DJD (degenerative joint disease)   . Full dentures   . GERD (gastroesophageal reflux disease)   . Glaucoma   . History of bleeding ulcers    patient reports multiple hospitalizations remotely  . Hypertension   . Wears glasses     Past Surgical History:  Procedure Laterality Date  . BREAST REDUCTION SURGERY Bilateral 04/25/2013   Procedure: MAMMARY REDUCTION  (BREAST);  Surgeon: Cristine Polio, MD;  Location: [facility];  Service: Plastics;  Laterality: Bilateral;  . COLONOSCOPY     2008, normal per patient  . COLONOSCOPY N/A 01/16/2017   Procedure: COLONOSCOPY;  Surgeon: Danie Binder, MD;  [facility];  Service: Endoscopy;  Laterality: N/A;  830   . COSMETIC  SURGERY    . ESOPHAGOGASTRODUODENOSCOPY N/A 01/16/2017   Procedure: ESOPHAGOGASTRODUODENOSCOPY (EGD);  Surgeon: Danie Binder, MD;  [facility];  Service: Endoscopy;  Laterality: N/A;  . PARTIAL COLECTOMY N/A 06/29/2016   Procedure: PARTIAL COLECTOMY;  Surgeon: Vickie Epley, MD;  [facility];  Service: General;  Laterality: N/A;  . 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   Procedure: TOTAL KNEE ARTHROPLASTY;  Surgeon: Carole Civil, MD;  [facility];  Service: Orthopedics;  Laterality: Left;  DePuy   . TUBAL LIGATION      There were no vitals filed for this visit.  Subjective Assessment - 06/22/19 0950    Subjective  COVID 19 screening performed on patient upon arrival. Reports she felt good yesterday and worked hard and has 4/10 LBP today. Reports she feels 100% better than she did when starting PT. Reports able to wash dishes and clean out cabinets and notices she never had to prop up or sit down.    Pertinent History  L TKA, HTN, Osteoporosis    Currently in Pain?  Yes    Pain Score  4     Pain Location  Back    Pain Orientation  Lower    Pain Descriptors / Indicators  Discomfort    Pain Type  Chronic pain    Pain Onset  More than  a month ago         [facility] PT Assessment - 06/22/19 0001      Assessment   Medical Diagnosis  Lumbar back pain, DDD lumbar    Referring Provider (PT)  Particia Nearing, PA-C    Next MD Visit  March 2021    Prior Therapy  yes      Precautions   Precautions  None      Restrictions   Weight Bearing Restrictions  No                   [facility] Adult PT Treatment/Exercise - 06/22/19 0001      Lumbar Exercises: Aerobic   Recumbent Bike  L4 x10 min      Lumbar Exercises: Machines for Strengthening   Cybex Lumbar Extension  50# x30 reps      Lumbar Exercises: Standing   Shoulder Extension  Strengthening;Both;20 reps;Limitations    Shoulder Extension Limitations  green XTS      Lumbar Exercises: Supine    Bridge  20 reps;3 seconds    Straight Leg Raise  20 reps;3 seconds      Modalities   Modalities  Electrical Stimulation;Moist Heat      Moist Heat Therapy   Number Minutes Moist Heat  10 Minutes    Moist Heat Location  Lumbar Spine      Electrical Stimulation   Electrical Stimulation Location  Bilateral Lumbar spine    Electrical Stimulation Action  Pre-Mod    Electrical Stimulation Parameters  80-150 hz x10 min    Electrical Stimulation Goals  Pain                  PT Long Term Goals - 06/20/19 0957      PT LONG TERM GOAL #1   Title  Patient will be independent with HEP.    Baseline  Pt able to verbally review and demonstrate her HEP    Time  6    Period  Weeks    Status  Achieved      PT LONG TERM GOAL #2   Title  Patient will report ability to perform ADLs and yark work with low back pain less than 3/10.    Baseline  Pt reporting she is able to do yard work with pain around 2-3/10.    Time  6    Period  Weeks    Status  Achieved      PT LONG TERM GOAL #3   Title  Patient will report ability to stand for greater than 15 minutes to perform house work and shower activities.    Baseline  Pt able to perform household chores for about 1 hour before having to take a break.    Time  6    Period  Weeks    Status  Achieved            Plan - 06/22/19 1116    Clinical Impression Statement  Patient has progressed very well with PT and is very pleased with her progress and her achievements with her ADLs. Patient very pleased with ability to do yardwork and housework without needing a break or propping on objects. Patient to resume gym activities once gyms reopen. Normal modalities response noted following removal of the modalities. Patient able to achieve all goals met in clinic and discharged with 27% FOTO limitation (CJ status.)    Examination-Activity Limitations  Reach Overhead    Stability/Clinical Decision Making  Stable/Uncomplicated  Rehab Potential  Good     PT Frequency  2x / week    PT Duration  6 weeks    PT Treatment/Interventions  ADLs/Self Care Home Management;Cryotherapy;Electrical Stimulation;Ultrasound;Traction;Moist Heat;Therapeutic exercise;Therapeutic activities    PT Next Visit Plan  D/C summary required.    PT Home Exercise Plan  see pt instructions/edu section    Consulted and Agree with Plan of Care  Patient       Patient will benefit from skilled therapeutic intervention in order to improve the following deficits and impairments:  Postural dysfunction, Pain, Decreased activity tolerance, Difficulty walking, Decreased strength  Visit Diagnosis: Abnormal posture  Chronic bilateral low back pain without sciatica     Problem List Patient Active Problem List   Diagnosis Date Noted  . PUD (peptic ulcer disease) 05/09/2019  . OAB (overactive bladder) 05/09/2019  . Lumbar back pain 05/09/2019  . DDD (degenerative disc disease), lumbar 05/09/2019  . Status post total knee replacement, 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 02/11/2018  . Iron deficiency anemia 04/28/2017  . Vitamin D deficiency 04/28/2017  . Osteopenia 04/28/2017  . Gastritis, erosive   . Abdominal pain, epigastric 12/12/2016  . Colon cancer screening 11/03/2016  . Essential hypertension 11/03/2016  . Primary osteoarthritis of both knees 11/03/2016  . GERD (gastroesophageal reflux disease) 11/03/2016  . Age-related osteoporosis without current pathological fracture 11/03/2016  . Perforation of sigmoid colon [facility]) 06/29/2016   Standley Brooking, PTA 06/22/19 11:29 AM   [facility] [address] [place], [place], [address] Phone: [phone]   Fax:  [phone]  Name: Tiffany Bradley MRN: [id number] Date of Birth: 1945/12/28

## 2019-08-29 ENCOUNTER — Other Ambulatory Visit: Payer: Self-pay | Admitting: Physician Assistant

## 2019-10-10 ENCOUNTER — Other Ambulatory Visit: Payer: Self-pay | Admitting: Physician Assistant

## 2019-10-31 ENCOUNTER — Other Ambulatory Visit: Payer: Self-pay | Admitting: Physician Assistant

## 2019-10-31 DIAGNOSIS — K219 Gastro-esophageal reflux disease without esophagitis: Secondary | ICD-10-CM

## 2019-10-31 DIAGNOSIS — K279 Peptic ulcer, site unspecified, unspecified as acute or chronic, without hemorrhage or perforation: Secondary | ICD-10-CM

## 2019-11-07 ENCOUNTER — Other Ambulatory Visit: Payer: Self-pay | Admitting: Physician Assistant

## 2019-11-07 DIAGNOSIS — Z96652 Presence of left artificial knee joint: Secondary | ICD-10-CM

## 2019-11-09 ENCOUNTER — Ambulatory Visit (INDEPENDENT_AMBULATORY_CARE_PROVIDER_SITE_OTHER): Payer: Medicare HMO | Admitting: *Deleted

## 2019-11-09 DIAGNOSIS — Z0000 Encounter for general adult medical examination without abnormal findings: Secondary | ICD-10-CM

## 2019-11-09 NOTE — Progress Notes (Signed)
MEDICARE ANNUAL WELLNESS VISIT  11/09/2019  Telephone Visit Disclaimer This Medicare AWV was conducted by telephone due to national recommendations for restrictions regarding the COVID-19 Pandemic (e.g. social distancing).  I verified, using two identifiers, that I am speaking with Tiffany Bradley or their authorized healthcare agent. I discussed the limitations, risks, security, and privacy concerns of performing an evaluation and management service by telephone and the potential availability of an in-person appointment in the future. The patient expressed understanding and agreed to proceed.   Subjective:  Tiffany Bradley is a 74 y.o. female patient of Remus Loffler, PA-C who had a Medicare Annual Wellness Visit today via telephone. Tiffany Bradley is retired and her 40 year old granddaughter lives with her. She has two daughters and one son, all local, that she sees regularly. She is active in church and sings in the choir. She enjoys crafting, yard work, and shopping. She reports that she is not currently physically active.    Patient Care Team: Caryl Never as PCP - General (Physician Assistant) West Bali, MD as Consulting Physician (Gastroenterology) Vickki Hearing, MD as Consulting Physician (Orthopedic Surgery)  Advanced Directives 11/09/2019 05/11/2019 10/18/2018 02/11/2018 02/11/2018 02/08/2018 01/05/2018  Does Patient Have a Medical Advance Directive? No No No No No No No  Would patient like information on creating a medical advance directive? No - Patient declined - Yes (MAU/Ambulatory/Procedural Areas - Information given) No - Patient declined No - Patient declined No - Patient declined -  Pre-existing out of facility DNR order (yellow form or pink MOST form) - - - - - - -    Hospital Utilization Over the Past 12 Months: # of hospitalizations or ER visits: 0 # of surgeries: 0  Review of Systems    Patient reports that her overall health is better compared to last year.  History  obtained from the patient. Patient Reported Readings (BP, Pulse, CBG, Weight, etc) none  Pain Assessment Pain : No/denies pain     Current Medications & Allergies (verified) Allergies as of 11/09/2019   No Known Allergies     Medication List       Accurate as of November 09, 2019 10:52 AM. If you have any questions, ask your nurse or doctor.        amLODipine 10 MG tablet Commonly known as: NORVASC Take 1 tablet (10 mg total) by mouth daily.   aspirin 325 MG EC tablet Take 1 tablet (325 mg total) by mouth daily with breakfast.   CALCIUM 600+D3 PO Take 1 tablet by mouth daily.   denosumab 60 MG/ML Sosy injection Commonly known as: PROLIA Bring to your doctor to inject 60mg  subcutaneously once.   diclofenac 75 MG EC tablet Commonly known as: VOLTAREN Take 1 tablet by mouth twice daily   docusate sodium 100 MG capsule Commonly known as: COLACE Take 1 capsule (100 mg total) by mouth 2 (two) times daily.   ferrous sulfate 325 (65 FE) MG tablet Take 325 mg by mouth daily.   gabapentin 300 MG capsule Commonly known as: NEURONTIN Take 1 capsule (300 mg total) by mouth 2 (two) times daily.   GLUCOSAMINE-MSM PO Take 2 tablets by mouth daily.   MULTI-VITAMIN GUMMIES PO Take 2 each by mouth daily.   omeprazole 40 MG capsule Commonly known as: PRILOSEC Take 1 capsule (40 mg total) by mouth daily.   PEG 400 Monostearate Powd Take 17 g by mouth daily.   sucralfate 1 g  tablet Commonly known as: CARAFATE TAKE 2 TABLETS BY MOUTH 4 TIMES DAILY WITH MEALS AND AT BEDTIME   tiZANidine 4 MG tablet Commonly known as: ZANAFLEX TAKE 1 TABLET BY MOUTH AT BEDTIME AS NEEDED FOR MUSCLE SPASMS   tolterodine 2 MG 24 hr capsule Commonly known as: DETROL LA Take 1 capsule (2 mg total) by mouth daily.   traMADol 50 MG tablet Commonly known as: ULTRAM Take 1 tablet (50 mg total) by mouth every 6 (six) hours as needed for moderate pain.       History (reviewed): Past Medical  History:  Diagnosis Date  . Arthritis   . Back pain   . Cataract   . DJD (degenerative joint disease)   . Full dentures   . GERD (gastroesophageal reflux disease)   . Glaucoma   . History of bleeding ulcers    patient reports multiple hospitalizations remotely  . Hypertension   . Wears glasses    Past Surgical History:  Procedure Laterality Date  . BREAST REDUCTION SURGERY Bilateral 04/25/2013   Procedure: MAMMARY REDUCTION  (BREAST);  Surgeon: Louisa Second, MD;  [facility];  Service: Plastics;  Laterality: Bilateral;  . COLONOSCOPY     2008, normal per patient  . COLONOSCOPY N/A 01/16/2017   Procedure: COLONOSCOPY;  Surgeon: West Bali, MD;  [facility];  Service: Endoscopy;  Laterality: N/A;  830   . COSMETIC SURGERY    . ESOPHAGOGASTRODUODENOSCOPY N/A 01/16/2017   Procedure: ESOPHAGOGASTRODUODENOSCOPY (EGD);  Surgeon: West Bali, MD;  [facility];  Service: Endoscopy;  Laterality: N/A;  . PARTIAL COLECTOMY N/A 06/29/2016   Procedure: PARTIAL COLECTOMY;  Surgeon: Ancil Linsey, MD;  [facility];  Service: General;  Laterality: N/A;  . 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   Procedure: TOTAL KNEE ARTHROPLASTY;  Surgeon: Vickki Hearing, MD;  [facility];  Service: Orthopedics;  Laterality: Left;  DePuy   . TUBAL LIGATION     Family History  Problem Relation Age of Onset  . Kidney disease Mother   . Stroke Mother   . Heart attack Father   . Hyperlipidemia Sister   . Hypertension Sister   . Diabetes Sister   . Hypertension Brother   . Hyperlipidemia Brother   . Hypertension Sister   . Hyperlipidemia Sister   . Colon cancer Neg Hx    Social History   Socioeconomic History  . Marital status: Divorced    Spouse name: Not on file  . Number of children: 3  . Years of education: 31  . Highest education level: Not on file  Occupational History  . Occupation: Retired    Associate Professor: [employer]  Tobacco Use  . Smoking status: Former Smoker    Packs/day: 3.00    Types: Cigarettes    Quit date: 04/20/1993    Years since quitting: 26.5  . Smokeless tobacco: Never Used  Substance and Sexual Activity  . Alcohol use: Not Currently  . Drug use: No  . Sexual activity: Not Currently  Other Topics Concern  . Not on file  Social History Narrative   Jazmin is retired and  her 83 year old granddaughter lives with her. She has two daughters and one son, all local that she sees regularly. She is active in church and sings in the choir. She enjoys crafting, yard work, and shopping.    Social Determinants of Health   Financial Resource Strain:   . Difficulty of  Paying Living Expenses: Not on file  Food Insecurity:   . Worried About Charity fundraiser in the Last Year: Not on file  . Ran Out of Food in the Last Year: Not on file  Transportation Needs:   . Lack of Transportation (Medical): Not on file  . Lack of Transportation (Non-Medical): Not on file  Physical Activity:   . Days of Exercise per Week: Not on file  . Minutes of Exercise per Session: Not on file  Stress:   . Feeling of Stress : Not on file  Social Connections:   . Frequency of Communication with Friends and Family: Not on file  . Frequency of Social Gatherings with Friends and Family: Not on file  . Attends Religious Services: Not on file  . Active Member of Clubs or Organizations: Not on file  . Attends Archivist Meetings: Not on file  . Marital Status: Not on file    Activities of Daily Living In your present state of health, do you have any difficulty performing the following activities: 11/09/2019  Hearing? N  Vision? N  Difficulty concentrating or making decisions? N  Walking or climbing stairs? N  Dressing or bathing? N  Doing errands, shopping? N  Preparing Food and eating ? N  Using the Toilet? N  In the past six months, have you accidently leaked urine? N  Do you have problems with  loss of bowel control? N  Managing your Medications? N  Managing your Finances? N  Housekeeping or managing your Housekeeping? N  Some recent data might be hidden    Patient Education/ Literacy How often do you need to have someone help you when you read instructions, pamphlets, or other written materials from your doctor or pharmacy?: 1 - Never What is the last grade level you completed in school?: 11  Exercise Current Exercise Habits: The patient does not participate in regular exercise at present, Exercise limited by: orthopedic condition(s)  Diet Patient reports consuming 2 meals a day and 2 snack(s) a day Patient reports that her primary diet is: Regular Patient reports that she does have regular access to food.   Depression Screen PHQ 2/9 Scores 11/09/2019 11/02/2018 10/18/2018 05/04/2018 11/02/2017 10/15/2017 04/28/2017  PHQ - 2 Score 0 0 0 0 0 0 0     Fall Risk Fall Risk  11/09/2019 11/02/2018 10/18/2018 05/04/2018 11/02/2017  Falls in the past year? 0 0 0 No No     Objective:  Tiffany Bradley seemed alert and oriented and she participated appropriately during our telephone visit.  Blood Pressure Weight BMI  BP Readings from Last 3 Encounters:  02/21/19 (!) 149/107  11/02/18 118/78  10/18/18 120/80   Wt Readings from Last 3 Encounters:  02/21/19 175 lb (79.4 kg)  11/02/18 174 lb (78.9 kg)  10/18/18 169 lb (76.7 kg)   BMI Readings from Last 1 Encounters:  02/21/19 31.00 kg/m    *Unable to obtain current vital signs, weight, and BMI due to telephone visit type  Hearing/Vision  . Esmirna did not seem to have difficulty with hearing/understanding during the telephone conversation . Reports that she has not had a formal eye exam by an eye care professional within the past year . Reports that she has not had a formal hearing evaluation within the past year *Unable to fully assess hearing and vision during telephone visit type  Cognitive Function: 6CIT Screen 11/09/2019  What Year? 0  points  What month? 0 points  What  time? 0 points  Count back from 20 0 points  Months in reverse 0 points  Repeat phrase 0 points  Total Score 0   (Normal:0-7, Significant for Dysfunction: >8)  Normal Cognitive Function Screening: Yes   Immunization & Health Maintenance Record  There is no immunization history on file for this patient.  Health Maintenance  Topic Date Due  . TETANUS/TDAP  11/08/2020 (Originally 02/07/1965)  . Hepatitis C Screening  11/08/2020 (Originally 11/11/1945)  . PNA vac Low Risk Adult (1 of 2 - PCV13) 11/08/2020 (Originally 02/08/2012)  . MAMMOGRAM  11/24/2019  . COLONOSCOPY  01/17/2027  . DEXA SCAN  Completed  . INFLUENZA VACCINE  Discontinued       Assessment  This is a routine wellness examination for Tiffany SarEva H Kauffmann.  Health Maintenance: Due or Overdue There are no preventive care reminders to display for this patient.  Tiffany SarEva H Verley does not need a referral for Community Assistance: Care Management:   no Social Work:    no Prescription Assistance:  no Nutrition/Diabetes Education:  no   Plan:  Personalized Goals Goals Addressed            This Visit's Progress   . Patient Stated       11/09/2019 AWV Goal: Exercise for General Health   Patient will verbalize understanding of the benefits of increased physical activity:  Exercising regularly is important. It will improve your overall fitness, flexibility, and endurance.  Regular exercise also will improve your overall health. It can help you control your weight, reduce stress, and improve your bone density.  Over the next year, patient will increase physical activity as tolerated with a goal of at least 150 minutes of moderate physical activity per week.   You can tell that you are exercising at a moderate intensity if your heart starts beating faster and you start breathing faster but can still hold a conversation.  Moderate-intensity exercise ideas include:  Walking 1 mile (1.6 km) in  about 15 minutes  Biking  Hiking  Golfing  Dancing  Water aerobics  Patient will verbalize understanding of everyday activities that increase physical activity by providing examples like the following: ? Yard work, such as: ? Pushing a Surveyor, mininglawn mower ? Raking and bagging leaves ? Washing your car ? Pushing a stroller ? Shoveling snow ? Gardening ? Washing windows or floors  Patient will be able to explain general safety guidelines for exercising:   Before you start a new exercise program, talk with your health care provider.  Do not exercise so much that you hurt yourself, feel dizzy, or get very short of breath.  Wear comfortable clothes and wear shoes with good support.  Drink plenty of water while you exercise to prevent dehydration or heat stroke.  Work out until your breathing and your heartbeat get faster.       Personalized Health Maintenance & Screening Recommendations     Lung Cancer Screening Recommended: no (Low Dose CT Chest recommended if Age 34-80 years, 30 pack-year currently smoking OR have quit w/in past 15 years) Hepatitis C Screening recommended: no HIV Screening recommended: no  Advanced Directives: Written information was not prepared per patient's request.  Referrals & Orders No orders of the defined types were placed in this encounter.   Follow-up Plan . Follow-up with Remus LofflerJones, Angel S, PA-C as planned   I have personally reviewed and noted the following in the patient's chart:   . Medical and social history . Use of alcohol,  tobacco or illicit drugs  . Current medications and supplements . Functional ability and status . Nutritional status . Physical activity . Advanced directives . List of other physicians . Hospitalizations, surgeries, and ER visits in previous 12 months . Vitals . Screenings to include cognitive, depression, and falls . Referrals and appointments  In addition, I have reviewed and discussed with Tiffany Bradley  certain preventive protocols, quality metrics, and best practice recommendations. A written personalized care plan for preventive services as well as general preventive health recommendations is available and can be mailed to the patient at her request.      Adella Hare, LPN 1/94/1740

## 2019-11-12 ENCOUNTER — Other Ambulatory Visit: Payer: Self-pay | Admitting: Physician Assistant

## 2019-11-12 DIAGNOSIS — Z96652 Presence of left artificial knee joint: Secondary | ICD-10-CM

## 2019-11-16 ENCOUNTER — Other Ambulatory Visit: Payer: Self-pay | Admitting: Physician Assistant

## 2019-11-16 DIAGNOSIS — I10 Essential (primary) hypertension: Secondary | ICD-10-CM

## 2019-12-12 ENCOUNTER — Encounter: Payer: Self-pay | Admitting: Family Medicine

## 2019-12-12 ENCOUNTER — Ambulatory Visit (INDEPENDENT_AMBULATORY_CARE_PROVIDER_SITE_OTHER): Payer: Medicare HMO

## 2019-12-12 ENCOUNTER — Other Ambulatory Visit: Payer: Self-pay

## 2019-12-12 ENCOUNTER — Ambulatory Visit (INDEPENDENT_AMBULATORY_CARE_PROVIDER_SITE_OTHER): Payer: Medicare HMO | Admitting: Family Medicine

## 2019-12-12 VITALS — BP 125/77 | HR 86 | Temp 98.0°F | Ht 63.0 in | Wt 175.2 lb

## 2019-12-12 DIAGNOSIS — Z7689 Persons encountering health services in other specified circumstances: Secondary | ICD-10-CM

## 2019-12-12 DIAGNOSIS — M810 Age-related osteoporosis without current pathological fracture: Secondary | ICD-10-CM | POA: Diagnosis not present

## 2019-12-12 DIAGNOSIS — Z96652 Presence of left artificial knee joint: Secondary | ICD-10-CM

## 2019-12-12 DIAGNOSIS — Z79899 Other long term (current) drug therapy: Secondary | ICD-10-CM

## 2019-12-12 DIAGNOSIS — K279 Peptic ulcer, site unspecified, unspecified as acute or chronic, without hemorrhage or perforation: Secondary | ICD-10-CM | POA: Diagnosis not present

## 2019-12-12 DIAGNOSIS — M5136 Other intervertebral disc degeneration, lumbar region: Secondary | ICD-10-CM

## 2019-12-12 DIAGNOSIS — I10 Essential (primary) hypertension: Secondary | ICD-10-CM | POA: Diagnosis not present

## 2019-12-12 DIAGNOSIS — M8589 Other specified disorders of bone density and structure, multiple sites: Secondary | ICD-10-CM | POA: Diagnosis not present

## 2019-12-12 MED ORDER — AMLODIPINE BESYLATE 10 MG PO TABS: 10.0000 mg | ORAL_TABLET | Freq: Every day | ORAL | 3 refills | Status: DC

## 2019-12-12 MED ORDER — TRAMADOL HCL 50 MG PO TABS: 50.0000 mg | ORAL_TABLET | Freq: Two times a day (BID) | ORAL | 2 refills | Status: DC | PRN

## 2019-12-12 MED ORDER — OMEPRAZOLE 40 MG PO CPDR: 40.0000 mg | DELAYED_RELEASE_CAPSULE | Freq: Every day | ORAL | 3 refills | Status: DC

## 2019-12-12 NOTE — Patient Instructions (Signed)
Dr Ethelene Hal is the back specialist in [place].    Controlled Substance Guidelines:  1. You cannot get an early refill, even it is lost.  2. You cannot get controlled medications from any other doctor, unless it is the emergency department and related to a new problem or injury.  3. You cannot use alcohol, marijuana, cocaine or any other recreational drugs while using this medication. This is very dangerous.  4. You are willing to have your urine drug tested at each visit.  5. You will not drive while using this medication, because that can put yourself and others in serious danger of an accident. 6. If any medication is stolen, then there must be a police report to verify it, or it cannot be refilled.  7. I will not prescribe these medications for longer than 3 months.  8. You must bring your pill bottle to each visit.  9. You must use the same pharmacy for all refills for the medication, unless you clear it with me beforehand.  10. You cannot share or sell this medication.

## 2019-12-12 NOTE — Progress Notes (Signed)
Subjective: CC: Establish care, chronic back pain PCP: Terald Sleeper, PA-C HPI:Tiffany Bradley is a 74 y.o. female presenting to clinic today for:  1.  Chronic back pain Patient reports longstanding history of low back pain.  She notes that she has seen the specialist in the past for this and was placed in physical therapy.  She does admit that the physical therapy does seem to help the low back pain but unfortunately she has not been doing this anymore due to COVID-19.  Denies any radiation down the legs.  Pain is worse with standing or lying and relieved by bending forward.  She is never had a surgery on her back but did have a surgery on her knee a couple of years ago.  Her previous PCP was given her tramadol to use intermittently for low back pain.  She is also on gabapentin, diclofenac.  Denies any excessive sedation with tramadol.  She does admit to constipation that is relieved by Colace.   ROS: Per HPI  No Known Allergies Past Medical History:  Diagnosis Date  . Arthritis   . Back pain   . Cataract   . DJD (degenerative joint disease)   . Full dentures   . GERD (gastroesophageal reflux disease)   . Glaucoma   . History of bleeding ulcers    patient reports multiple hospitalizations remotely  . Hypertension   . Wears glasses     Current Outpatient Medications:  .  amLODipine (NORVASC) 10 MG tablet, Take 1 tablet (10 mg total) by mouth daily. Needs to be seen for more refills., Disp: 30 tablet, Rfl: 0 .  aspirin EC 325 MG EC tablet, Take 1 tablet (325 mg total) by mouth daily with breakfast., Disp: 30 tablet, Rfl: 0 .  Calcium Carb-Cholecalciferol (CALCIUM 600+D3 PO), Take 1 tablet by mouth daily., Disp: , Rfl:  .  denosumab (PROLIA) 60 MG/ML SOSY injection, Bring to your doctor to inject 25m subcutaneously once., Disp: 1 mL, Rfl: 0 .  diclofenac (VOLTAREN) 75 MG EC tablet, Take 1 tablet by mouth twice daily, Disp: 60 tablet, Rfl: 0 .  docusate sodium (COLACE) 100 MG capsule,  Take 1 capsule (100 mg total) by mouth 2 (two) times daily. (Patient not taking: Reported on 11/09/2019), Disp: 10 capsule, Rfl: 0 .  ferrous sulfate 325 (65 FE) MG tablet, Take 325 mg by mouth daily., Disp: , Rfl:  .  gabapentin (NEURONTIN) 300 MG capsule, Take 1 capsule (300 mg total) by mouth 2 (two) times daily., Disp: 60 capsule, Rfl: 11 .  Glucosamine HCl-MSM (GLUCOSAMINE-MSM PO), Take 2 tablets by mouth daily., Disp: , Rfl:  .  Multiple Vitamins-Minerals (MULTI-VITAMIN GUMMIES PO), Take 2 each by mouth daily. , Disp: , Rfl:  .  omeprazole (PRILOSEC) 40 MG capsule, Take 1 capsule (40 mg total) by mouth daily. Needs to be seen for more refills., Disp: 30 capsule, Rfl: 0 .  PEG 400 Monostearate POWD, Take 17 g by mouth daily. (Patient not taking: Reported on 11/09/2019), Disp: 454 g, Rfl: 5 .  sucralfate (CARAFATE) 1 g tablet, TAKE 2 TABLETS BY MOUTH 4 TIMES DAILY WITH MEALS AND AT BEDTIME, Disp: 120 tablet, Rfl: 0 .  tiZANidine (ZANAFLEX) 4 MG tablet, TAKE 1 TABLET BY MOUTH AT BEDTIME AS NEEDED FOR MUSCLE SPASMS, Disp: 60 tablet, Rfl: 11 .  tolterodine (DETROL LA) 2 MG 24 hr capsule, Take 1 capsule (2 mg total) by mouth daily., Disp: 90 capsule, Rfl: 3 .  traMADol (ULTRAM)  50 MG tablet, Take 1 tablet (50 mg total) by mouth every 6 (six) hours as needed for moderate pain., Disp: 90 tablet, Rfl: 2 Social History   Socioeconomic History  . Marital status: Divorced    Spouse name: Not on file  . Number of children: 3  . Years of education: 39  . Highest education level: Not on file  Occupational History  . Occupation: Retired    Fish farm manager: [employer]  Tobacco Use  . Smoking status: Former Smoker    Packs/day: 3.00    Types: Cigarettes    Quit date: 04/20/1993    Years since quitting: 26.6  . Smokeless tobacco: Never Used  Substance and Sexual Activity  . Alcohol use: Not Currently  . Drug use: No  . Sexual activity: Not Currently  Other Topics Concern  . Not on file  Social  History Narrative   Harlow is retired and  her 18 year old granddaughter lives with her. She has two daughters and one son, all local that she sees regularly. She is active in church and sings in the choir. She enjoys crafting, yard work, and shopping.    Social Determinants of Health   Financial Resource Strain:   . Difficulty of Paying Living Expenses:   Food Insecurity:   . Worried About Charity fundraiser in the Last Year:   . Arboriculturist in the Last Year:   Transportation Needs:   . Film/video editor (Medical):   Marland Kitchen Lack of Transportation (Non-Medical):   Physical Activity:   . Days of Exercise per Week:   . Minutes of Exercise per Session:   Stress:   . Feeling of Stress :   Social Connections:   . Frequency of Communication with Friends and Family:   . Frequency of Social Gatherings with Friends and Family:   . Attends Religious Services:   . Active Member of Clubs or Organizations:   . Attends Archivist Meetings:   Marland Kitchen Marital Status:   Intimate Partner Violence:   . Fear of Current or Ex-Partner:   . Emotionally Abused:   Marland Kitchen Physically Abused:   . Sexually Abused:    Family History  Problem Relation Age of Onset  . Kidney disease Mother   . Stroke Mother   . Heart attack Father   . Hyperlipidemia Sister   . Hypertension Sister   . Diabetes Sister   . Hypertension Brother   . Hyperlipidemia Brother   . Hypertension Sister   . Hyperlipidemia Sister   . Colon cancer Neg Hx     Objective: Office vital signs reviewed. BP 125/77   Pulse 86   Temp 98 F (36.7 C)   Ht '5\' 3"'  (1.6 m)   Wt 175 lb 3.2 oz (79.5 kg)   SpO2 100%   BMI 31.04 kg/m   Physical Examination:  General: Awake, alert, well nourished, No acute distress HEENT: Normal, sclera white, MMM Cardio: regular rate and rhythm, S1S2 heard, no murmurs appreciated Pulm: clear to auscultation bilaterally, no wheezes, rhonchi or rales; normal work of breathing on room air Extremities:  warm, well perfused, No edema, cyanosis or clubbing; +2 pulses bilaterally MSK: normal gait and station  Lumbar spine: No midline tenderness to palpation.  No paraspinal muscle tenderness to palpation.  No appreciable increased tonicity of the paraspinal muscles.   Assessment/ Plan: 74 y.o. female   1. DDD (degenerative disc disease), lumbar The national narcotic database was reviewed and there were  no red flags.  I did go ahead and have her do a UDS and controlled substance contract as per office policy.  I have renewed the tramadol with the caveat that she will need to at some point to seek reevaluation for this ongoing low back pain.  I reinforced that she needs to get back into physical therapy as well as nonopioid forms of treatment will be the best for her long-term.  She is considering seeing Dr. Nelva Bush and will contact me for formal referral when she discusses this further with her daughter as she will require her daughter to transport her to [facility] for evaluation. - ToxASSURE Select 13 (MW), Urine - traMADol (ULTRAM) 50 MG tablet; Take 1 tablet (50 mg total) by mouth every 12 (twelve) hours as needed for moderate pain or severe pain.  Dispense: 60 tablet; Refill: 2  2. Controlled substance agreement signed - ToxASSURE Select 13 (MW), Urine  3. Essential hypertension Controlled - CMP14+EGFR - Lipid Panel - amLODipine (NORVASC) 10 MG tablet; Take 1 tablet (10 mg total) by mouth daily.  Dispense: 90 tablet; Refill: 3  4. Age-related osteoporosis without current pathological fracture - CMP14+EGFR - CBC - TSH - DG [facility] DEXA; Future  5. PUD (peptic ulcer disease) Stable  6. Establishing care with new doctor, encounter for  7. 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  No orders of the defined types were placed in this encounter.    Janora Norlander, DO [facility] [phone]

## 2019-12-13 LAB — CMP14+EGFR
ALT: 15 IU/L (ref 0–32)
AST: 21 IU/L (ref 0–40)
Albumin/Globulin Ratio: 2 (ref 1.2–2.2)
Albumin: 4.4 g/dL (ref 3.7–4.7)
Alkaline Phosphatase: 74 IU/L (ref 39–117)
BUN/Creatinine Ratio: 30 — ABNORMAL HIGH (ref 12–28)
BUN: 19 mg/dL (ref 8–27)
Bilirubin Total: 0.4 mg/dL (ref 0.0–1.2)
CO2: 24 mmol/L (ref 20–29)
Calcium: 9.6 mg/dL (ref 8.7–10.3)
Chloride: 106 mmol/L (ref 96–106)
Creatinine, Ser: 0.63 mg/dL (ref 0.57–1.00)
GFR calc Af Amer: 104 mL/min/{1.73_m2} (ref >59)
GFR calc non Af Amer: 90 mL/min/{1.73_m2} (ref >59)
Globulin, Total: 2.2 g/dL (ref 1.5–4.5)
Glucose: 80 mg/dL (ref 65–99)
Potassium: 4.8 mmol/L (ref 3.5–5.2)
Sodium: 148 mmol/L — ABNORMAL HIGH (ref 134–144)
Total Protein: 6.6 g/dL (ref 6.0–8.5)

## 2019-12-13 LAB — CBC
Hematocrit: 37.2 % (ref 34.0–46.6)
Hemoglobin: 11.9 g/dL (ref 11.1–15.9)
MCH: 26.8 pg (ref 26.6–33.0)
MCHC: 32 g/dL (ref 31.5–35.7)
MCV: 84 fL (ref 79–97)
Platelets: 276 10*3/uL (ref 150–450)
RBC: 4.44 x10E6/uL (ref 3.77–5.28)
RDW: 14.8 % (ref 11.7–15.4)
WBC: 3.8 10*3/uL (ref 3.4–10.8)

## 2019-12-13 LAB — TSH: TSH: 1.75 u[IU]/mL (ref 0.450–4.500)

## 2019-12-13 LAB — LIPID PANEL
Chol/HDL Ratio: 3.2 ratio (ref 0.0–4.4)
Cholesterol, Total: 188 mg/dL (ref 100–199)
HDL: 59 mg/dL (ref >39)
LDL Chol Calc (NIH): 120 mg/dL — ABNORMAL HIGH (ref 0–99)
Triglycerides: 45 mg/dL (ref 0–149)
VLDL Cholesterol Cal: 9 mg/dL (ref 5–40)

## 2019-12-14 LAB — TOXASSURE SELECT 13 (MW), URINE

## 2019-12-21 ENCOUNTER — Other Ambulatory Visit: Payer: Self-pay | Admitting: *Deleted

## 2019-12-21 MED ORDER — DICLOFENAC SODIUM 75 MG PO TBEC: 75.0000 mg | DELAYED_RELEASE_TABLET | Freq: Two times a day (BID) | ORAL | 1 refills | Status: DC

## 2019-12-27 DIAGNOSIS — H2513 Age-related nuclear cataract, bilateral: Secondary | ICD-10-CM | POA: Diagnosis not present

## 2019-12-27 DIAGNOSIS — H33002 Unspecified retinal detachment with retinal break, left eye: Secondary | ICD-10-CM | POA: Diagnosis not present

## 2019-12-28 DIAGNOSIS — H43811 Vitreous degeneration, right eye: Secondary | ICD-10-CM | POA: Diagnosis not present

## 2019-12-28 DIAGNOSIS — H4312 Vitreous hemorrhage, left eye: Secondary | ICD-10-CM | POA: Diagnosis not present

## 2019-12-28 DIAGNOSIS — H35031 Hypertensive retinopathy, right eye: Secondary | ICD-10-CM | POA: Diagnosis not present

## 2019-12-31 DIAGNOSIS — Z008 Encounter for other general examination: Secondary | ICD-10-CM | POA: Diagnosis not present

## 2019-12-31 DIAGNOSIS — M1990 Unspecified osteoarthritis, unspecified site: Secondary | ICD-10-CM | POA: Diagnosis not present

## 2019-12-31 DIAGNOSIS — G8929 Other chronic pain: Secondary | ICD-10-CM | POA: Diagnosis not present

## 2019-12-31 DIAGNOSIS — K5900 Constipation, unspecified: Secondary | ICD-10-CM | POA: Diagnosis not present

## 2019-12-31 DIAGNOSIS — K259 Gastric ulcer, unspecified as acute or chronic, without hemorrhage or perforation: Secondary | ICD-10-CM | POA: Diagnosis not present

## 2019-12-31 DIAGNOSIS — K219 Gastro-esophageal reflux disease without esophagitis: Secondary | ICD-10-CM | POA: Diagnosis not present

## 2019-12-31 DIAGNOSIS — N3941 Urge incontinence: Secondary | ICD-10-CM | POA: Diagnosis not present

## 2019-12-31 DIAGNOSIS — H547 Unspecified visual loss: Secondary | ICD-10-CM | POA: Diagnosis not present

## 2019-12-31 DIAGNOSIS — M810 Age-related osteoporosis without current pathological fracture: Secondary | ICD-10-CM | POA: Diagnosis not present

## 2019-12-31 DIAGNOSIS — I10 Essential (primary) hypertension: Secondary | ICD-10-CM | POA: Diagnosis not present

## 2019-12-31 DIAGNOSIS — E669 Obesity, unspecified: Secondary | ICD-10-CM | POA: Diagnosis not present

## 2020-01-05 DIAGNOSIS — H3581 Retinal edema: Secondary | ICD-10-CM | POA: Diagnosis not present

## 2020-01-05 DIAGNOSIS — H348321 Tributary (branch) retinal vein occlusion, left eye, with retinal neovascularization: Secondary | ICD-10-CM | POA: Diagnosis not present

## 2020-01-05 DIAGNOSIS — H4312 Vitreous hemorrhage, left eye: Secondary | ICD-10-CM | POA: Diagnosis not present

## 2020-01-06 DIAGNOSIS — H348322 Tributary (branch) retinal vein occlusion, left eye, stable: Secondary | ICD-10-CM | POA: Diagnosis not present

## 2020-01-06 DIAGNOSIS — H4312 Vitreous hemorrhage, left eye: Secondary | ICD-10-CM | POA: Diagnosis not present

## 2020-01-13 DIAGNOSIS — H348322 Tributary (branch) retinal vein occlusion, left eye, stable: Secondary | ICD-10-CM | POA: Diagnosis not present

## 2020-01-16 ENCOUNTER — Other Ambulatory Visit: Payer: Self-pay | Admitting: *Deleted

## 2020-01-16 DIAGNOSIS — K219 Gastro-esophageal reflux disease without esophagitis: Secondary | ICD-10-CM

## 2020-01-16 DIAGNOSIS — K279 Peptic ulcer, site unspecified, unspecified as acute or chronic, without hemorrhage or perforation: Secondary | ICD-10-CM

## 2020-01-16 MED ORDER — SUCRALFATE 1 G PO TABS: ORAL_TABLET | ORAL | 1 refills | Status: DC

## 2020-02-01 DIAGNOSIS — H348322 Tributary (branch) retinal vein occlusion, left eye, stable: Secondary | ICD-10-CM | POA: Diagnosis not present

## 2020-02-02 ENCOUNTER — Telehealth: Payer: Self-pay | Admitting: Family Medicine

## 2020-02-02 NOTE — Telephone Encounter (Signed)
  REFERRAL REQUEST Telephone Note 02/02/2020  What type of referral do you need? INJECTION FOR BACK PAIN/ ortho  Have you been seen at our office for this problem? yes (Advise that they may need an appointment with their PCP before a referral can be done)  Is there a particular doctor or location that you prefer? no  Patient notified that referrals can take up to a week or longer to process. If they haven't heard anything within a week they should call back and speak with the referral department.

## 2020-02-03 ENCOUNTER — Other Ambulatory Visit: Payer: Self-pay | Admitting: Family Medicine

## 2020-02-03 DIAGNOSIS — M5136 Other intervertebral disc degeneration, lumbar region: Secondary | ICD-10-CM

## 2020-02-03 NOTE — Telephone Encounter (Signed)
done

## 2020-02-12 ENCOUNTER — Encounter: Payer: Self-pay | Admitting: Family Medicine

## 2020-02-13 ENCOUNTER — Other Ambulatory Visit: Payer: Self-pay | Admitting: Family Medicine

## 2020-02-13 DIAGNOSIS — M5136 Other intervertebral disc degeneration, lumbar region: Secondary | ICD-10-CM

## 2020-02-22 DIAGNOSIS — M545 Low back pain: Secondary | ICD-10-CM | POA: Diagnosis not present

## 2020-02-28 ENCOUNTER — Other Ambulatory Visit: Payer: Self-pay | Admitting: *Deleted

## 2020-02-28 MED ORDER — DENOSUMAB 60 MG/ML ~~LOC~~ SOSY: PREFILLED_SYRINGE | SUBCUTANEOUS | 0 refills | Status: DC

## 2020-03-05 ENCOUNTER — Other Ambulatory Visit: Payer: Self-pay | Admitting: Family Medicine

## 2020-03-08 ENCOUNTER — Ambulatory Visit (INDEPENDENT_AMBULATORY_CARE_PROVIDER_SITE_OTHER): Payer: Medicare HMO | Admitting: *Deleted

## 2020-03-08 ENCOUNTER — Other Ambulatory Visit: Payer: Self-pay

## 2020-03-08 DIAGNOSIS — M810 Age-related osteoporosis without current pathological fracture: Secondary | ICD-10-CM

## 2020-03-08 MED ORDER — DENOSUMAB 60 MG/ML ~~LOC~~ SOSY
60.0000 mg | PREFILLED_SYRINGE | SUBCUTANEOUS | Status: AC
  Administered 2020-03-08: 60 mg via SUBCUTANEOUS

## 2020-03-08 NOTE — Progress Notes (Signed)
Pt tolerated injection well

## 2020-03-12 ENCOUNTER — Ambulatory Visit (INDEPENDENT_AMBULATORY_CARE_PROVIDER_SITE_OTHER): Payer: Medicare HMO | Admitting: Family Medicine

## 2020-03-12 ENCOUNTER — Other Ambulatory Visit: Payer: Self-pay

## 2020-03-12 VITALS — BP 127/78 | HR 81 | Temp 97.3°F | Wt 173.0 lb

## 2020-03-12 DIAGNOSIS — N3281 Overactive bladder: Secondary | ICD-10-CM

## 2020-03-12 DIAGNOSIS — E7800 Pure hypercholesterolemia, unspecified: Secondary | ICD-10-CM | POA: Diagnosis not present

## 2020-03-12 DIAGNOSIS — M5136 Other intervertebral disc degeneration, lumbar region: Secondary | ICD-10-CM

## 2020-03-12 DIAGNOSIS — K219 Gastro-esophageal reflux disease without esophagitis: Secondary | ICD-10-CM | POA: Diagnosis not present

## 2020-03-12 DIAGNOSIS — Z96652 Presence of left artificial knee joint: Secondary | ICD-10-CM

## 2020-03-12 DIAGNOSIS — M51369 Other intervertebral disc degeneration, lumbar region without mention of lumbar back pain or lower extremity pain: Secondary | ICD-10-CM

## 2020-03-12 DIAGNOSIS — Z8669 Personal history of other diseases of the nervous system and sense organs: Secondary | ICD-10-CM

## 2020-03-12 DIAGNOSIS — K279 Peptic ulcer, site unspecified, unspecified as acute or chronic, without hemorrhage or perforation: Secondary | ICD-10-CM | POA: Diagnosis not present

## 2020-03-12 MED ORDER — SUCRALFATE 1 G PO TABS: ORAL_TABLET | ORAL | 3 refills | Status: DC

## 2020-03-12 MED ORDER — ATORVASTATIN CALCIUM 20 MG PO TABS: 20.0000 mg | ORAL_TABLET | Freq: Every day | ORAL | 3 refills | Status: DC

## 2020-03-12 MED ORDER — DICLOFENAC SODIUM 75 MG PO TBEC: 75.0000 mg | DELAYED_RELEASE_TABLET | Freq: Two times a day (BID) | ORAL | 3 refills | Status: DC | PRN

## 2020-03-12 MED ORDER — TOLTERODINE TARTRATE ER 2 MG PO CP24: 2.0000 mg | ORAL_CAPSULE | Freq: Every day | ORAL | 3 refills | Status: DC

## 2020-03-12 MED ORDER — TRAMADOL HCL 50 MG PO TABS: 50.0000 mg | ORAL_TABLET | Freq: Two times a day (BID) | ORAL | 2 refills | Status: DC | PRN

## 2020-03-12 NOTE — Progress Notes (Signed)
Subjective: CC: f/u chronic back pain PCP: Raliegh Ip, DO HPI:Tiffany Bradley is a 74 y.o. female presenting to clinic today for:  1.  Chronic back pain Patient reports longstanding history of low back pain.  PT helpful.  Currently seeing an orthopedist.  Continues to use Tramadol intermittently, maybe 2 to 3 days/week.  She continues to take Voltaren fairly regularly.  She did see the specialist and she was put on a prednisone Dosepak, which she just stopped last week.  She did have some adverse side effects where she felt that the blood pressure went up and she was just overall feeling poorly.  However, her back did improve on the prednisone.  She has a follow-up visit with them on the 21st.  She does have constipation with tramadol and tries to use this very sparingly.  2.  Hyperlipidemia/retinal detachment Patient has been cutting back on cholesterol rich foods and trying to increase her fiber.  She does want to go ahead and start the cholesterol medicine however.  No chest pain, shortness of breath but did have recent retinal tear/detachment fixed in the left eye with Dr. Lelon Perla in [place].  She has follow-up with him on the 20th.  She reports vision has gone back to normal and she is in need of new glasses now.  3.  GERD/ PUD Patient with history of peptic ulcer disease.  She uses NSAID intermittently as above.  She is on Carafate and PPI.  Denies any hematochezia, melena, abdominal pain.  ROS: Per HPI  No Known Allergies Past Medical History:  Diagnosis Date   Arthritis    Back pain    Cataract    DJD (degenerative joint disease)    Full dentures    GERD (gastroesophageal reflux disease)    Glaucoma    History of bleeding ulcers    patient reports multiple hospitalizations remotely   Hypertension    Wears glasses     Current Outpatient Medications:    amLODipine (NORVASC) 10 MG tablet, Take 1 tablet (10 mg total) by mouth daily., Disp: 90 tablet, Rfl:  3   aspirin EC 325 MG EC tablet, Take 1 tablet (325 mg total) by mouth daily with breakfast., Disp: 30 tablet, Rfl: 0   Calcium Carb-Cholecalciferol (CALCIUM 600+D3 PO), Take 1 tablet by mouth daily., Disp: , Rfl:    denosumab (PROLIA) 60 MG/ML SOSY injection, Bring to your doctor to inject 60mg  subcutaneously once., Disp: 1 mL, Rfl: 0   diclofenac (VOLTAREN) 75 MG EC tablet, Take 1 tablet by mouth twice daily, Disp: 60 tablet, Rfl: 0   docusate sodium (COLACE) 100 MG capsule, Take 1 capsule (100 mg total) by mouth 2 (two) times daily., Disp: 10 capsule, Rfl: 0   ferrous sulfate 325 (65 FE) MG tablet, Take 325 mg by mouth daily., Disp: , Rfl:    gabapentin (NEURONTIN) 300 MG capsule, Take 1 capsule (300 mg total) by mouth 2 (two) times daily., Disp: 60 capsule, Rfl: 11   Glucosamine HCl-MSM (GLUCOSAMINE-MSM PO), Take 2 tablets by mouth daily., Disp: , Rfl:    Multiple Vitamins-Minerals (MULTI-VITAMIN GUMMIES PO), Take 2 each by mouth daily. , Disp: , Rfl:    omeprazole (PRILOSEC) 40 MG capsule, Take 1 capsule (40 mg total) by mouth daily., Disp: 90 capsule, Rfl: 3   PEG 400 Monostearate POWD, Take 17 g by mouth daily., Disp: 454 g, Rfl: 5   sucralfate (CARAFATE) 1 g tablet, TAKE 2 TABLETS BY MOUTH 4 TIMES DAILY  WITH MEALS AND AT BEDTIME, Disp: 120 tablet, Rfl: 1   tiZANidine (ZANAFLEX) 4 MG tablet, TAKE 1 TABLET BY MOUTH AT BEDTIME AS NEEDED FOR MUSCLE SPASMS, Disp: 60 tablet, Rfl: 11   tolterodine (DETROL LA) 2 MG 24 hr capsule, Take 1 capsule (2 mg total) by mouth daily., Disp: 90 capsule, Rfl: 3   traMADol (ULTRAM) 50 MG tablet, Take 1 tablet (50 mg total) by mouth every 12 (twelve) hours as needed for moderate pain or severe pain., Disp: 60 tablet, Rfl: 2  Current Facility-Administered Medications:    denosumab (PROLIA) injection 60 mg, 60 mg, Subcutaneous, Q6 months, Takenya Travaglini M, DO, 60 mg at 03/08/20 9211 Social History   Socioeconomic History   Marital status:  Divorced    Spouse name: Not on file   Number of children: 3   Years of education: 11   Highest education level: Not on file  Occupational History   Occupation: Retired    Associate Professor: [employer]  Tobacco Use   Smoking status: Former Smoker    Packs/day: 3.00    Types: Cigarettes    Quit date: 04/20/1993    Years since quitting: 26.9   Smokeless tobacco: Never Used  Vaping Use   Vaping Use: Never used  Substance and Sexual Activity   Alcohol use: Not Currently   Drug use: No   Sexual activity: Not Currently  Other Topics Concern   Not on file  Social History Narrative   Delpha is retired and  her 56 year old granddaughter lives with her. She has two daughters and one son, all local that she sees regularly. She is active in church and sings in the choir. She enjoys crafting, yard work, and shopping.    Social Determinants of Health   Financial Resource Strain:    Difficulty of Paying Living Expenses:   Food Insecurity:    Worried About Programme researcher, broadcasting/film/video in the Last Year:    Barista in the Last Year:   Transportation Needs:    Freight forwarder (Medical):    Lack of Transportation (Non-Medical):   Physical Activity:    Days of Exercise per Week:    Minutes of Exercise per Session:   Stress:    Feeling of Stress :   Social Connections:    Frequency of Communication with Friends and Family:    Frequency of Social Gatherings with Friends and Family:    Attends Religious Services:    Active Member of Clubs or Organizations:    Attends Engineer, structural:    Marital Status:   Intimate Partner Violence:    Fear of Current or Ex-Partner:    Emotionally Abused:    Physically Abused:    Sexually Abused:    Family History  Problem Relation Age of Onset   Kidney disease Mother    Stroke Mother    Heart attack Father    Hyperlipidemia Sister    Hypertension Sister    Diabetes Sister    Hypertension Brother     Hyperlipidemia Brother    Hypertension Sister    Hyperlipidemia Sister    Colon cancer Neg Hx     Objective: Office vital signs reviewed. BP 127/78    Pulse 81    Temp (!) 97.3 F (36.3 C)    Wt 173 lb (78.5 kg)    SpO2 98%    BMI 30.65 kg/m   Physical Examination:  General: Awake, alert, well nourished, No acute distress  HEENT: Normal, sclera white, MMM Cardio: regular rate and rhythm, S1S2 heard, no murmurs appreciated Pulm: clear to auscultation bilaterally, no wheezes, rhonchi or rales; normal work of breathing on room air Extremities: warm, well perfused, No edema, cyanosis or clubbing; +2 pulses bilaterally MSK: normal gait and station  Assessment/ Plan: 74 y.o. female   1. DDD (degenerative disc disease), lumbar Stable.  Continue follow-up with orthopedics.  The national narcotic database was reviewed and there were no red flags.  She still has 1 remaining refill on Rx sent in April.  I have sent in a new prescription to place on hold. - traMADol (ULTRAM) 50 MG tablet; Take 1 tablet (50 mg total) by mouth every 12 (twelve) hours as needed for moderate pain or severe pain. Put on file  Dispense: 60 tablet; Refill: 2  2. 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 Pure hypercholesterolemia Start Lipitor 20 mg daily.  Plan to follow-up in 3 to 4 months for recheck fasting lipid panel and LFTs - atorvastatin (LIPITOR) 20 MG tablet; Take 1 tablet (20 mg total) by mouth daily.  Dispense: 90 tablet; Refill: 3  4. History of retinal detachment Status post repair.  Encouraged her to have ophthalmologist send me notes  5. Gastroesophageal reflux disease without esophagitis Stable.  Caution with NSAID - sucralfate (CARAFATE) 1 g tablet; TAKE 2 TABLETS BY MOUTH 4 TIMES DAILY WITH MEALS AND AT BEDTIME  Dispense: 120 tablet; Refill: 3  6. PUD (peptic ulcer disease) - sucralfate (CARAFATE) 1 g tablet; TAKE 2 TABLETS BY MOUTH 4 TIMES DAILY WITH MEALS AND AT BEDTIME   Dispense: 120 tablet; Refill: 3  7. OAB (overactive bladder) - tolterodine (DETROL LA) 2 MG 24 hr capsule; Take 1 capsule (2 mg total) by mouth daily.  Dispense: 90 capsule; Refill: 3   No orders of the defined types were placed in this encounter.  No orders of the defined types were placed in this encounter.    Raliegh Ip, DO [facility] [phone]

## 2020-03-13 ENCOUNTER — Telehealth: Payer: Self-pay | Admitting: *Deleted

## 2020-03-13 DIAGNOSIS — K279 Peptic ulcer, site unspecified, unspecified as acute or chronic, without hemorrhage or perforation: Secondary | ICD-10-CM

## 2020-03-13 DIAGNOSIS — K219 Gastro-esophageal reflux disease without esophagitis: Secondary | ICD-10-CM

## 2020-03-13 MED ORDER — SUCRALFATE 1 G PO TABS: ORAL_TABLET | ORAL | 3 refills | Status: DC

## 2020-03-13 NOTE — Telephone Encounter (Signed)
New rx sent to the pharmacy to reflect the correct sig of 1gram QID.

## 2020-03-13 NOTE — Telephone Encounter (Signed)
1g QID

## 2020-03-13 NOTE — Telephone Encounter (Signed)
Fax from [facility] Re: Sucralfate 1 gm Pls clarify: 2 tabs (2g) or 1 tab = 1 g QID

## 2020-03-20 DIAGNOSIS — H5200 Hypermetropia, unspecified eye: Secondary | ICD-10-CM | POA: Diagnosis not present

## 2020-03-20 DIAGNOSIS — Z0100 Encounter for examination of eyes and vision without abnormal findings: Secondary | ICD-10-CM | POA: Diagnosis not present

## 2020-03-20 DIAGNOSIS — E7800 Pure hypercholesterolemia, unspecified: Secondary | ICD-10-CM | POA: Diagnosis not present

## 2020-03-20 DIAGNOSIS — I10 Essential (primary) hypertension: Secondary | ICD-10-CM | POA: Diagnosis not present

## 2020-03-20 DIAGNOSIS — H2513 Age-related nuclear cataract, bilateral: Secondary | ICD-10-CM | POA: Diagnosis not present

## 2020-03-21 DIAGNOSIS — M5136 Other intervertebral disc degeneration, lumbar region: Secondary | ICD-10-CM | POA: Diagnosis not present

## 2020-03-23 ENCOUNTER — Other Ambulatory Visit: Payer: Self-pay | Admitting: *Deleted

## 2020-03-23 DIAGNOSIS — M170 Bilateral primary osteoarthritis of knee: Secondary | ICD-10-CM

## 2020-03-23 MED ORDER — TIZANIDINE HCL 4 MG PO TABS: 2.0000 mg | ORAL_TABLET | Freq: Three times a day (TID) | ORAL | 3 refills | Status: DC | PRN

## 2020-04-19 DIAGNOSIS — M5136 Other intervertebral disc degeneration, lumbar region: Secondary | ICD-10-CM | POA: Diagnosis not present

## 2020-05-08 DIAGNOSIS — M545 Low back pain: Secondary | ICD-10-CM | POA: Diagnosis not present

## 2020-06-12 ENCOUNTER — Other Ambulatory Visit: Payer: Self-pay | Admitting: *Deleted

## 2020-06-12 DIAGNOSIS — Z96652 Presence of left artificial knee joint: Secondary | ICD-10-CM

## 2020-06-12 MED ORDER — GABAPENTIN 300 MG PO CAPS: 300.0000 mg | ORAL_CAPSULE | Freq: Two times a day (BID) | ORAL | 0 refills | Status: DC

## 2020-07-16 ENCOUNTER — Encounter: Payer: Self-pay | Admitting: Family Medicine

## 2020-07-16 ENCOUNTER — Other Ambulatory Visit: Payer: Self-pay

## 2020-07-16 ENCOUNTER — Ambulatory Visit (INDEPENDENT_AMBULATORY_CARE_PROVIDER_SITE_OTHER): Payer: Medicare HMO | Admitting: Family Medicine

## 2020-07-16 VITALS — BP 121/76 | HR 82 | Temp 97.2°F | Ht 63.0 in | Wt 175.0 lb

## 2020-07-16 DIAGNOSIS — M51369 Other intervertebral disc degeneration, lumbar region without mention of lumbar back pain or lower extremity pain: Secondary | ICD-10-CM

## 2020-07-16 DIAGNOSIS — M5136 Other intervertebral disc degeneration, lumbar region: Secondary | ICD-10-CM | POA: Diagnosis not present

## 2020-07-16 DIAGNOSIS — E7800 Pure hypercholesterolemia, unspecified: Secondary | ICD-10-CM | POA: Diagnosis not present

## 2020-07-16 DIAGNOSIS — K219 Gastro-esophageal reflux disease without esophagitis: Secondary | ICD-10-CM

## 2020-07-16 DIAGNOSIS — Z96652 Presence of left artificial knee joint: Secondary | ICD-10-CM

## 2020-07-16 DIAGNOSIS — K279 Peptic ulcer, site unspecified, unspecified as acute or chronic, without hemorrhage or perforation: Secondary | ICD-10-CM

## 2020-07-16 MED ORDER — GABAPENTIN 300 MG PO CAPS: 300.0000 mg | ORAL_CAPSULE | Freq: Two times a day (BID) | ORAL | 12 refills | Status: DC

## 2020-07-16 MED ORDER — SUCRALFATE 1 G PO TABS: ORAL_TABLET | ORAL | 3 refills | Status: DC

## 2020-07-16 MED ORDER — TRAMADOL HCL 50 MG PO TABS: 50.0000 mg | ORAL_TABLET | Freq: Two times a day (BID) | ORAL | 2 refills | Status: DC | PRN

## 2020-07-16 MED ORDER — DICLOFENAC SODIUM 75 MG PO TBEC: 75.0000 mg | DELAYED_RELEASE_TABLET | Freq: Two times a day (BID) | ORAL | 3 refills | Status: DC | PRN

## 2020-07-16 NOTE — Progress Notes (Signed)
Subjective: CC: f/u HLD PCP: Janora Norlander, DO HPI:Tiffany Bradley is a 74 y.o. female presenting to clinic today for:  1. HLD Patient here for interval follow-up on hyperlipidemia.  She was started on Lipitor 20 mg daily about 4 months ago.  Her ASCVD risk at that time was 12.2%.  She notes that she is tolerating the Lipitor without difficulty.  No chest pain, shortness of breath, abdominal pain, nausea or vomiting.  2.  DDD Patient reports compliance with Voltaren, gabapentin and uses the tramadol very sparingly.  She does get constipation with tramadol and therefore she does not like to use it.  She is status post injection into her back which did not really help.  There is an MRI of her lumbar spine planned.  3.  Peptic ulcer disease Patient reports compliance with her PPI.  She needs refills on the sucralfate.  No hematochezia, melena, nausea or vomiting.  She is tolerating p.o. intake without difficulty.  Uses NSAID as above.  ROS: Per HPI  No Known Allergies Past Medical History:  Diagnosis Date  . Arthritis   . Back pain   . Cataract   . DJD (degenerative joint disease)   . Full dentures   . GERD (gastroesophageal reflux disease)   . Glaucoma   . History of bleeding ulcers    patient reports multiple hospitalizations remotely  . Hypertension   . Wears glasses     Current Outpatient Medications:  .  amLODipine (NORVASC) 10 MG tablet, Take 1 tablet (10 mg total) by mouth daily., Disp: 90 tablet, Rfl: 3 .  aspirin EC 325 MG EC tablet, Take 1 tablet (325 mg total) by mouth daily with breakfast., Disp: 30 tablet, Rfl: 0 .  atorvastatin (LIPITOR) 20 MG tablet, Take 1 tablet (20 mg total) by mouth daily., Disp: 90 tablet, Rfl: 3 .  Calcium Carb-Cholecalciferol (CALCIUM 600+D3 PO), Take 1 tablet by mouth daily., Disp: , Rfl:  .  denosumab (PROLIA) 60 MG/ML SOSY injection, Bring to your doctor to inject 83m subcutaneously once., Disp: 1 mL, Rfl: 0 .  diclofenac (VOLTAREN)  75 MG EC tablet, Take 1 tablet (75 mg total) by mouth 2 (two) times daily as needed for moderate pain., Disp: 60 tablet, Rfl: 3 .  docusate sodium (COLACE) 100 MG capsule, Take 1 capsule (100 mg total) by mouth 2 (two) times daily., Disp: 10 capsule, Rfl: 0 .  ferrous sulfate 325 (65 FE) MG tablet, Take 325 mg by mouth daily., Disp: , Rfl:  .  gabapentin (NEURONTIN) 300 MG capsule, Take 1 capsule (300 mg total) by mouth 2 (two) times daily., Disp: 60 capsule, Rfl: 0 .  Glucosamine HCl-MSM (GLUCOSAMINE-MSM PO), Take 2 tablets by mouth daily., Disp: , Rfl:  .  Multiple Vitamins-Minerals (MULTI-VITAMIN GUMMIES PO), Take 2 each by mouth daily. , Disp: , Rfl:  .  omeprazole (PRILOSEC) 40 MG capsule, Take 1 capsule (40 mg total) by mouth daily., Disp: 90 capsule, Rfl: 3 .  PEG 400 Monostearate POWD, Take 17 g by mouth daily., Disp: 454 g, Rfl: 5 .  sucralfate (CARAFATE) 1 g tablet, TAKE 1 TABLET BY MOUTH 4 TIMES DAILY WITH MEALS AND AT BEDTIME, Disp: 120 tablet, Rfl: 3 .  tiZANidine (ZANAFLEX) 4 MG tablet, Take 0.5-1 tablets (2-4 mg total) by mouth every 8 (eight) hours as needed for muscle spasms (SPARING use)., Disp: 60 tablet, Rfl: 3 .  tolterodine (DETROL LA) 2 MG 24 hr capsule, Take 1 capsule (2 mg  total) by mouth daily., Disp: 90 capsule, Rfl: 3 .  traMADol (ULTRAM) 50 MG tablet, Take 1 tablet (50 mg total) by mouth every 12 (twelve) hours as needed for moderate pain or severe pain. Put on file, Disp: 60 tablet, Rfl: 2  Current Facility-Administered Medications:  .  denosumab (PROLIA) injection 60 mg, 60 mg, Subcutaneous, Q6 months, Luke Falero M, DO, 60 mg at 03/08/20 2761 Social History   Socioeconomic History  . Marital status: Divorced    Spouse name: Not on file  . Number of children: 3  . Years of education: 7  . Highest education level: Not on file  Occupational History  . Occupation: Retired    Fish farm manager: [employer]  Tobacco Use  . Smoking status: Former Smoker     Packs/day: 3.00    Types: Cigarettes    Quit date: 04/20/1993    Years since quitting: 27.2  . Smokeless tobacco: Never Used  Vaping Use  . Vaping Use: Never used  Substance and Sexual Activity  . Alcohol use: Not Currently  . Drug use: No  . Sexual activity: Not Currently  Other Topics Concern  . Not on file  Social History Narrative   [place] is retired and  her 42 year old granddaughter lives with her. She has two daughters and one son, all local that she sees regularly. She is active in church and sings in the choir. She enjoys crafting, yard work, and shopping.    Social Determinants of Health   Financial Resource Strain:   . Difficulty of Paying Living Expenses: Not on file  Food Insecurity:   . Worried About Charity fundraiser in the Last Year: Not on file  . Ran Out of Food in the Last Year: Not on file  Transportation Needs:   . Lack of Transportation (Medical): Not on file  . Lack of Transportation (Non-Medical): Not on file  Physical Activity:   . Days of Exercise per Week: Not on file  . Minutes of Exercise per Session: Not on file  Stress:   . Feeling of Stress : Not on file  Social Connections:   . Frequency of Communication with Friends and Family: Not on file  . Frequency of Social Gatherings with Friends and Family: Not on file  . Attends Religious Services: Not on file  . Active Member of Clubs or Organizations: Not on file  . Attends Archivist Meetings: Not on file  . Marital Status: Not on file  Intimate Partner Violence:   . Fear of Current or Ex-Partner: Not on file  . Emotionally Abused: Not on file  . Physically Abused: Not on file  . Sexually Abused: Not on file   Family History  Problem Relation Age of Onset  . Kidney disease Mother   . Stroke Mother   . Heart attack Father   . Hyperlipidemia Sister   . Hypertension Sister   . Diabetes Sister   . Hypertension Brother   . Hyperlipidemia Brother   . Hypertension Sister   .  Hyperlipidemia Sister   . Colon cancer Neg Hx     Objective: Office vital signs reviewed. BP 121/76   Pulse 82   Temp (!) 97.2 F (36.2 C) (Temporal)   Ht '5\' 3"'  (1.6 m)   Wt 175 lb (79.4 kg)   SpO2 94%   BMI 31.00 kg/m   Physical Examination:  General: Awake, alert, well nourished, No acute distress HEENT: Normal, sclera white, MMM Cardio: regular  rate and rhythm, S1S2 heard, no murmurs appreciated Pulm: clear to auscultation bilaterally, no wheezes, rhonchi or rales; normal work of breathing on room air Extremities: warm, well perfused, No edema, cyanosis or clubbing; +2 pulses bilaterally GI: Flat, soft, nontender, nondistended MSK: normal gait and station; ambulating independently  Assessment/ Plan: 74 y.o. female   1. Pure hypercholesterolemia She will come in for fasting cholesterol panel and liver function test.  Thus far she is doing excellently on Lipitor 20 mg daily. - CMP14+EGFR; Future - Lipid Panel; Future  2. DDD (degenerative disc disease), lumbar Stable with as needed use of tramadol and daily use of Voltaren.  She will continue to follow-up with her specialist.  The national narcotic database was reviewed and there were no red flags.  Future order for tramadol placed, as her other prescription will likely expire before she gets the remaining refills - traMADol (ULTRAM) 50 MG tablet; Take 1 tablet (50 mg total) by mouth every 12 (twelve) hours as needed for moderate pain or severe pain. Put on file  Dispense: 60 tablet; Refill: 2  3. 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  Dispense: 60 capsule; Refill: 12  4. Gastroesophageal reflux disease without esophagitis Stable - sucralfate (CARAFATE) 1 g tablet; TAKE 1 TABLET BY MOUTH 4 TIMES DAILY WITH MEALS AND AT BEDTIME  Dispense: 120 tablet; Refill: 3  5. PUD (peptic ulcer disease) stable -  sucralfate (CARAFATE) 1 g tablet; TAKE 1 TABLET BY MOUTH 4 TIMES DAILY WITH MEALS AND AT BEDTIME  Dispense: 120 tablet; Refill: 3   No orders of the defined types were placed in this encounter.  No orders of the defined types were placed in this encounter.    Janora Norlander, DO [place] [phone]

## 2020-07-16 NOTE — Patient Instructions (Signed)
Come in for fasting labs at your earliest convenience.

## 2020-07-17 ENCOUNTER — Other Ambulatory Visit: Payer: Self-pay

## 2020-07-17 ENCOUNTER — Other Ambulatory Visit: Payer: Medicare HMO

## 2020-07-17 DIAGNOSIS — E7800 Pure hypercholesterolemia, unspecified: Secondary | ICD-10-CM | POA: Diagnosis not present

## 2020-07-18 LAB — CMP14+EGFR
ALT: 25 IU/L (ref 0–32)
AST: 29 IU/L (ref 0–40)
Albumin/Globulin Ratio: 1.8 (ref 1.2–2.2)
Albumin: 4.6 g/dL (ref 3.7–4.7)
Alkaline Phosphatase: 66 IU/L (ref 44–121)
BUN/Creatinine Ratio: 26 (ref 12–28)
BUN: 15 mg/dL (ref 8–27)
Bilirubin Total: 0.7 mg/dL (ref 0.0–1.2)
CO2: 24 mmol/L (ref 20–29)
Calcium: 9.3 mg/dL (ref 8.7–10.3)
Chloride: 106 mmol/L (ref 96–106)
Creatinine, Ser: 0.58 mg/dL (ref 0.57–1.00)
GFR calc Af Amer: 106 mL/min/{1.73_m2} (ref >59)
GFR calc non Af Amer: 92 mL/min/{1.73_m2} (ref >59)
Globulin, Total: 2.6 g/dL (ref 1.5–4.5)
Glucose: 90 mg/dL (ref 65–99)
Potassium: 4.8 mmol/L (ref 3.5–5.2)
Sodium: 146 mmol/L — ABNORMAL HIGH (ref 134–144)
Total Protein: 7.2 g/dL (ref 6.0–8.5)

## 2020-07-18 LAB — LIPID PANEL
Chol/HDL Ratio: 2.1 ratio (ref 0.0–4.4)
Cholesterol, Total: 136 mg/dL (ref 100–199)
HDL: 64 mg/dL (ref >39)
LDL Chol Calc (NIH): 61 mg/dL (ref 0–99)
Triglycerides: 48 mg/dL (ref 0–149)
VLDL Cholesterol Cal: 11 mg/dL (ref 5–40)

## 2020-08-16 ENCOUNTER — Other Ambulatory Visit: Payer: Self-pay

## 2020-08-16 ENCOUNTER — Ambulatory Visit: Payer: Medicare HMO

## 2020-08-16 ENCOUNTER — Ambulatory Visit: Payer: Medicare HMO | Admitting: Orthopedic Surgery

## 2020-08-16 ENCOUNTER — Encounter: Payer: Self-pay | Admitting: Orthopedic Surgery

## 2020-08-16 VITALS — BP 140/94 | HR 84 | Ht 67.0 in | Wt 172.0 lb

## 2020-08-16 DIAGNOSIS — G8929 Other chronic pain: Secondary | ICD-10-CM

## 2020-08-16 DIAGNOSIS — M1711 Unilateral primary osteoarthritis, right knee: Secondary | ICD-10-CM | POA: Diagnosis not present

## 2020-08-16 DIAGNOSIS — M25561 Pain in right knee: Secondary | ICD-10-CM | POA: Diagnosis not present

## 2020-08-16 NOTE — Patient Instructions (Signed)
Call us for injection when needed you can get 1 in 3 months or if you are ready for surgery let us know

## 2020-08-16 NOTE — Progress Notes (Addendum)
CLANCY LEINER  08/16/2020  Body mass index is 26.94 kg/m.  MEDICAL DECISION SECTION:  Encounter Diagnosis  Name Primary?  . Chronic pain of right knee Yes    Imaging Images right knee see report basically has a moderate valgus deformity with grade 4 arthritis medial compartment  Plan:  (Rx., Inj., surg., Frx, MRI/CT, XR:2)  Patient was given the injection not ready for surgery just yet, will return for surgery or injection when needed until ready for surgery  Procedure note right knee injection   verbal consent was obtained to inject right knee joint  Timeout was completed to confirm the site of injection  The medications used were 40 mg of Depo-Medrol and 1% lidocaine 3 cc  Anesthesia was provided by ethyl chloride and the skin was prepped with alcohol.  After cleaning the skin with alcohol a 20-gauge needle was used to inject the right knee joint. There were no complications. A sterile bandage was applied.   HISTORY SECTION :  Chief Complaint  Patient presents with  . Knee Pain    Right knee, worse in last few months,     HPI  The patient presents for evaluation of right, she is status post right total knee did well.  Today she complains of worsening right knee pain swelling and stiffness.  She has a lot of her motion but it feels hard to bend  ROS  No shortness of breath or chest pain  has a past medical history of Arthritis, Back pain, Cataract, DJD (degenerative joint disease), Full dentures, GERD (gastroesophageal reflux disease), Glaucoma, History of bleeding ulcers, Hypertension, and Wears glasses.    Past Surgical History:  Procedure Laterality Date  . BREAST REDUCTION SURGERY Bilateral 04/25/2013   Procedure: MAMMARY REDUCTION  (BREAST);  Surgeon: Louisa Second, MD;  Location: [facility];  Service: Plastics;  Laterality: Bilateral;  . COLONOSCOPY     2008, normal per patient  . COLONOSCOPY N/A 01/16/2017   Procedure: COLONOSCOPY;   Surgeon: West Bali, MD;  Location: [facility];  Service: Endoscopy;  Laterality: N/A;  830   . COSMETIC SURGERY    . ESOPHAGOGASTRODUODENOSCOPY N/A 01/16/2017   Procedure: ESOPHAGOGASTRODUODENOSCOPY (EGD);  Surgeon: West Bali, MD;  Location: [facility];  Service: Endoscopy;  Laterality: N/A;  . PARTIAL COLECTOMY N/A 06/29/2016   Procedure: PARTIAL COLECTOMY;  Surgeon: Ancil Linsey, MD;  Location: [facility];  Service: General;  Laterality: N/A;  . 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   Procedure: TOTAL KNEE ARTHROPLASTY;  Surgeon: Vickki Hearing, MD;  Location: [facility];  Service: Orthopedics;  Laterality: Left;  [place]   . TUBAL LIGATION       Social History   Tobacco Use  . Smoking status: Former Smoker    Packs/day: 3.00    Types: Cigarettes    Quit date: 04/20/1993    Years since quitting: 27.3  . Smokeless tobacco: Never Used  Vaping Use  . Vaping Use: Never used  Substance Use Topics  . Alcohol use: Not Currently  . Drug use: No       No Known Allergies   Current Outpatient Medications:  .  amLODipine (NORVASC) 10 MG tablet, Take 1 tablet (10 mg total) by mouth daily., Disp: 90 tablet, Rfl: 3 .  aspirin EC 325 MG EC tablet, Take 1 tablet (325 mg total) by mouth daily with breakfast., Disp: 30 tablet, Rfl: 0 .  atorvastatin (LIPITOR) 20 MG tablet, Take 1 tablet (20  mg total) by mouth daily., Disp: 90 tablet, Rfl: 3 .  Calcium Carb-Cholecalciferol (CALCIUM 600+D3 PO), Take 1 tablet by mouth daily., Disp: , Rfl:  .  denosumab (PROLIA) 60 MG/ML SOSY injection, Bring to your doctor to inject 60mg  subcutaneously once., Disp: 1 mL, Rfl: 0 .  diclofenac (VOLTAREN) 75 MG EC tablet, Take 1 tablet (75 mg total) by mouth 2 (two) times daily as needed for moderate pain., Disp: 60 tablet, Rfl: 3 .  ferrous sulfate 325 (65 FE) MG tablet, Take 325 mg by mouth daily., Disp: , Rfl:  .  gabapentin (NEURONTIN) 300 MG capsule, Take 1 capsule (300 mg total) by mouth 2  (two) times daily., Disp: 60 capsule, Rfl: 12 .  Glucosamine HCl-MSM (GLUCOSAMINE-MSM PO), Take 2 tablets by mouth daily., Disp: , Rfl:  .  Multiple Vitamins-Minerals (MULTI-VITAMIN GUMMIES PO), Take 2 each by mouth daily. , Disp: , Rfl:  .  omeprazole (PRILOSEC) 40 MG capsule, Take 1 capsule (40 mg total) by mouth daily., Disp: 90 capsule, Rfl: 3 .  PEG 400 Monostearate POWD, Take 17 g by mouth daily., Disp: 454 g, Rfl: 5 .  sucralfate (CARAFATE) 1 g tablet, TAKE 1 TABLET BY MOUTH 4 TIMES DAILY WITH MEALS AND AT BEDTIME, Disp: 120 tablet, Rfl: 3 .  tiZANidine (ZANAFLEX) 4 MG tablet, Take 0.5-1 tablets (2-4 mg total) by mouth every 8 (eight) hours as needed for muscle spasms (SPARING use)., Disp: 60 tablet, Rfl: 3 .  tolterodine (DETROL LA) 2 MG 24 hr capsule, Take 1 capsule (2 mg total) by mouth daily., Disp: 90 capsule, Rfl: 3 .  traMADol (ULTRAM) 50 MG tablet, Take 1 tablet (50 mg total) by mouth every 12 (twelve) hours as needed for moderate pain or severe pain. Put on file, Disp: 60 tablet, Rfl: 2  Current Facility-Administered Medications:  .  denosumab (PROLIA) injection 60 mg, 60 mg, Subcutaneous, Q6 months, Gottschalk, Ashly M, DO, 60 mg at 03/08/20 0835   PHYSICAL EXAM SECTION: BP (!) 140/94   Pulse 84   Ht 5\' 7"  (1.702 m)   Wt 172 lb (78 kg)   BMI 26.94 kg/m   Body mass index is 26.94 kg/m.   General appearance: Well-developed well-nourished no gross deformities  Lymph nodes: No lymphadenopathy  Neck is supple without palpable mass, full range of motion  Cardiovascular normal pulse and perfusion normal color without edema  Neurologically deep tendon reflexes are equal and normal, no sensation loss or deficits no pathologic reflexes  Psychological: Awake alert and oriented x3 mood and affect normal  Skin no lacerations or ulcerations no nodularity no palpable masses, no erythema or nodularity  Musculoskeletal: right knee  Slightly altered gait  Lateral compartment  tenderness small effusion full extension 125 degrees of flexion no laxity  10:36 AM

## 2020-10-01 ENCOUNTER — Other Ambulatory Visit: Payer: Self-pay | Admitting: Family Medicine

## 2020-10-01 DIAGNOSIS — Z1231 Encounter for screening mammogram for malignant neoplasm of breast: Secondary | ICD-10-CM

## 2020-10-24 ENCOUNTER — Other Ambulatory Visit: Payer: Self-pay

## 2020-10-24 ENCOUNTER — Ambulatory Visit
Admission: RE | Admit: 2020-10-24 | Discharge: 2020-10-24 | Disposition: A | Discharge status: Home / Self Care | Payer: Medicare HMO | Source: Ambulatory Visit | Attending: Family Medicine | Admitting: Family Medicine

## 2020-10-24 DIAGNOSIS — Z1231 Encounter for screening mammogram for malignant neoplasm of breast: Secondary | ICD-10-CM

## 2020-11-14 ENCOUNTER — Ambulatory Visit: Payer: Medicare HMO

## 2020-11-14 ENCOUNTER — Encounter: Payer: Self-pay | Admitting: Orthopedic Surgery

## 2020-11-14 ENCOUNTER — Other Ambulatory Visit: Payer: Self-pay

## 2020-11-14 ENCOUNTER — Ambulatory Visit: Payer: Medicare HMO | Admitting: Orthopedic Surgery

## 2020-11-14 DIAGNOSIS — M1612 Unilateral primary osteoarthritis, left hip: Secondary | ICD-10-CM | POA: Diagnosis not present

## 2020-11-14 DIAGNOSIS — M25561 Pain in right knee: Secondary | ICD-10-CM

## 2020-11-14 DIAGNOSIS — M7062 Trochanteric bursitis, left hip: Secondary | ICD-10-CM

## 2020-11-14 DIAGNOSIS — Z96652 Presence of left artificial knee joint: Secondary | ICD-10-CM

## 2020-11-14 DIAGNOSIS — M25552 Pain in left hip: Secondary | ICD-10-CM

## 2020-11-14 DIAGNOSIS — G8929 Other chronic pain: Secondary | ICD-10-CM

## 2020-11-14 NOTE — Patient Instructions (Signed)
Hip Bursitis  Hip bursitis is swelling of one or more fluid-filled sacs (bursae) in your hip joint. This condition can cause pain, and your symptoms may come and go over time. What are the causes?  Repeated use of your hip muscles.  Injury to the hip.  Weak butt muscles.  Bone spurs.  Infection. In some cases, the cause may not be known. What increases the risk? You are more likely to develop this condition if:  You had a past hip injury or hip surgery.  You have a condition, such as arthritis, gout, diabetes, or thyroid disease.  You have spine problems.  You have one leg that is shorter than the other.  You run a lot or do long-distance running.  You play sports where there is a risk of injury or falling, such as football, martial arts, or skiing. What are the signs or symptoms? Symptoms may come and go, and they often include:  Pain in the hip or groin area. Pain may get worse when you move your hip.  Tenderness and swelling of the hip. In rare cases, the bursa may become infected. If this happens, you may get a fever, as well as have warmth and redness in the hip area. How is this treated? This condition is treated by:  Resting your hip.  Icing your hip.  Wrapping the hip area with an elastic bandage (compression wrap).  Keeping the hip raised. Other treatments may include medicine, draining fluid out of the bursa, or using crutches, a cane, or a walker. Surgery may be needed, but this is rare. Long-term treatment may include doing exercises to help your strength and flexibility. It may also include lifestyle changes like losing weight to lessen the strain on your hip. Follow these instructions at home: Managing pain, stiffness, and swelling  If told, put ice on the painful area. ? Put ice in a plastic bag. ? Place a towel between your skin and the bag. ? Leave the ice on for 20 minutes, 2-3 times a day.  Raise your hip by putting a pillow under your hips  while you lie down. Stop if you feel pain.  If told, put heat on the affected area. Do this as often as told by your doctor. Use a moist heat pack or a heating pad as told by your doctor. ? Place a towel between your skin and the heat source. ? Leave the heat on for 20-30 minutes. ? Take off the heat if your skin turns bright red. This is very important if you are unable to feel pain, heat, or cold. You may have a greater risk of getting burned.      Activity  Do not use your hip to support your body weight until your doctor says that you can.  Use crutches, a cane, or a walker as told by your doctor.  If the affected leg is one that you use to drive, ask your doctor if it is safe to drive.  Rest and protect your hip as much as you can until you feel better.  Return to your normal activities as told by your doctor. Ask your doctor what activities are safe for you.  Do exercises as told by your doctor. General instructions  Take over-the-counter and prescription medicines only as told by your doctor.  Gently rub and stretch your injured area as often as is comfortable.  Wear elastic bandages only as told by your doctor.  If one of your legs is   shorter than the other, get fitted for a shoe insert or orthotic.  Keep a healthy weight. Follow instructions from your doctor.  Keep all follow-up visits as told by your doctor. This is important. How is this prevented?  Exercise regularly, as told by your doctor.  Wear the right shoes for the sport you play.  Warm up and stretch before being active. Cool down and stretch after being active.  Take breaks often from repeated activity.  Avoid activities that bother your hip or cause pain.  Avoid sitting down for a long time. Where to find more information  American Academy [employer]: orthoinfo.aaos.org Contact a doctor if:  You have a fever.  You have new symptoms.  You have trouble walking or doing everyday  activities.  You have pain that gets worse or does not get better with medicine.  Your skin around your hip is red.  You get a feeling of warmth in your hip area. Get help right away if:  You cannot move your hip.  You have very bad pain.  You cannot control the muscles in your feet. Summary  Hip bursitis is swelling of one or more fluid-filled sacs (bursae) in your hip joint.  Symptoms often come and go over time.  This condition is often treated by resting and icing the hip. It also may help to keep the area raised and wrapped in an elastic bandage. Other treatments may be needed. This information is not intended to replace advice given to you by your health care provider. Make sure you discuss any questions you have with your health care provider. Document Revised: 06/20/2019 Document Reviewed: 04/26/2018 Elsevier Patient Education  2021 Elsevier Inc.  

## 2020-11-14 NOTE — Progress Notes (Signed)
Chief Complaint  Patient presents with  . Knee Pain     Right knee, Request injections  . Hip Pain    Left hip dx with bursitis by PCP   C/o right knee pain and left hip pain    A steroid injection was performed at right knee joint  using 1% plain xylocaine  and 6 mg of Celestone. This was well tolerated.  A steroid injection was performed at left hip using 1% plain Lidocaine and 6 mg of Celestone. This was well tolerated.  Encounter Diagnoses  Name Primary?  . Pain in left hip Yes  . Chronic pain of right knee   . 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 Trochanteric bursitis, left hip    Fu prn

## 2020-11-21 ENCOUNTER — Other Ambulatory Visit: Payer: Self-pay | Admitting: Family Medicine

## 2020-11-21 DIAGNOSIS — M170 Bilateral primary osteoarthritis of knee: Secondary | ICD-10-CM

## 2020-12-11 ENCOUNTER — Other Ambulatory Visit: Payer: Self-pay | Admitting: Family Medicine

## 2020-12-11 DIAGNOSIS — I10 Essential (primary) hypertension: Secondary | ICD-10-CM

## 2020-12-27 ENCOUNTER — Telehealth: Payer: Self-pay

## 2020-12-27 NOTE — Telephone Encounter (Signed)
  Prescription Request  12/27/2020  What is the name of the medication or equipment? prolia  Have you contacted your pharmacy to request a refill? (if applicable) yes  Which pharmacy would you like this sent to? [facility]   Patient notified that their request is being sent to the clinical staff for review and that they should receive a response within 2 business days.

## 2021-01-03 ENCOUNTER — Telehealth: Payer: Self-pay

## 2021-01-14 ENCOUNTER — Ambulatory Visit (INDEPENDENT_AMBULATORY_CARE_PROVIDER_SITE_OTHER): Payer: Medicare HMO | Admitting: Family Medicine

## 2021-01-14 ENCOUNTER — Encounter: Payer: Self-pay | Admitting: Family Medicine

## 2021-01-14 ENCOUNTER — Other Ambulatory Visit: Payer: Self-pay

## 2021-01-14 VITALS — BP 129/83 | HR 81 | Temp 97.8°F | Ht 67.0 in | Wt 174.6 lb

## 2021-01-14 DIAGNOSIS — M5136 Other intervertebral disc degeneration, lumbar region: Secondary | ICD-10-CM

## 2021-01-14 DIAGNOSIS — Z79899 Other long term (current) drug therapy: Secondary | ICD-10-CM | POA: Diagnosis not present

## 2021-01-14 DIAGNOSIS — M778 Other enthesopathies, not elsewhere classified: Secondary | ICD-10-CM

## 2021-01-14 MED ORDER — TRAMADOL HCL 50 MG PO TABS: 50.0000 mg | ORAL_TABLET | Freq: Two times a day (BID) | ORAL | 2 refills | Status: DC | PRN

## 2021-01-14 NOTE — Progress Notes (Signed)
Subjective: CC: Pain management PCP: Raliegh Ip, DO HPI:Tiffany Bradley is a 75 y.o. female presenting to clinic today for:  1.  Pain management Patient is here for interval follow-up on chronic pain.  She is treated with tramadol as needed for chronic back pain.  She notes that she is actually been having some left-sided elbow pain on the extensor surface.  This seems to be more prominent when she is trying to hold her tablet to read or play game at nighttime.  Denies any soft tissue swelling, sensory changes.  Denies any constipation.  Only using the tramadol 1 to 2 days/week and always uses a stool softener to avoid constipation.  No falls, dizziness, respiratory depression.   ROS: Per HPI  No Known Allergies Past Medical History:  Diagnosis Date  . Arthritis   . Back pain   . Cataract   . DJD (degenerative joint disease)   . Full dentures   . GERD (gastroesophageal reflux disease)   . Glaucoma   . History of bleeding ulcers    patient reports multiple hospitalizations remotely  . Hypertension   . Wears glasses     Current Outpatient Medications:  .  amLODipine (NORVASC) 10 MG tablet, Take 1 tablet by mouth once daily, Disp: 90 tablet, Rfl: 0 .  aspirin EC 325 MG EC tablet, Take 1 tablet (325 mg total) by mouth daily with breakfast., Disp: 30 tablet, Rfl: 0 .  atorvastatin (LIPITOR) 20 MG tablet, Take 1 tablet (20 mg total) by mouth daily., Disp: 90 tablet, Rfl: 3 .  Calcium Carb-Cholecalciferol (CALCIUM 600+D3 PO), Take 1 tablet by mouth daily., Disp: , Rfl:  .  denosumab (PROLIA) 60 MG/ML SOSY injection, Bring to your doctor to inject 60mg  subcutaneously once., Disp: 1 mL, Rfl: 0 .  diclofenac (VOLTAREN) 75 MG EC tablet, Take 1 tablet (75 mg total) by mouth 2 (two) times daily as needed for moderate pain., Disp: 60 tablet, Rfl: 3 .  ferrous sulfate 325 (65 FE) MG tablet, Take 325 mg by mouth daily., Disp: , Rfl:  .  gabapentin (NEURONTIN) 300 MG capsule, Take 1  capsule (300 mg total) by mouth 2 (two) times daily., Disp: 60 capsule, Rfl: 12 .  Glucosamine HCl-MSM (GLUCOSAMINE-MSM PO), Take 2 tablets by mouth daily., Disp: , Rfl:  .  Multiple Vitamins-Minerals (MULTI-VITAMIN GUMMIES PO), Take 2 each by mouth daily. , Disp: , Rfl:  .  omeprazole (PRILOSEC) 40 MG capsule, Take 1 capsule by mouth once daily, Disp: 90 capsule, Rfl: 0 .  PEG 400 Monostearate POWD, Take 17 g by mouth daily., Disp: 454 g, Rfl: 5 .  sucralfate (CARAFATE) 1 g tablet, TAKE 1 TABLET BY MOUTH 4 TIMES DAILY WITH MEALS AND AT BEDTIME, Disp: 120 tablet, Rfl: 3 .  tiZANidine (ZANAFLEX) 4 MG tablet, TAKE 1/2 TO 1 (ONE-HALF TO ONE) TABLET BY MOUTH EVERY 8 HOURS AS NEEDED FOR MUSCLE SPASM. USE SPARINGLY, Disp: 60 tablet, Rfl: 0 .  tolterodine (DETROL LA) 2 MG 24 hr capsule, Take 1 capsule (2 mg total) by mouth daily., Disp: 90 capsule, Rfl: 3 .  traMADol (ULTRAM) 50 MG tablet, Take 1 tablet (50 mg total) by mouth every 12 (twelve) hours as needed for moderate pain or severe pain. Put on file, Disp: 60 tablet, Rfl: 2  Current Facility-Administered Medications:  .  denosumab (PROLIA) injection 60 mg, 60 mg, Subcutaneous, Q6 months, Elleni Mozingo M, DO, 60 mg at 03/08/20 05/09/20 Social History   Socioeconomic History  .  Marital status: Divorced    Spouse name: Not on file  . Number of children: 3  . Years of education: 24  . Highest education level: Not on file  Occupational History  . Occupation: Retired    Associate Professor: [employer]  Tobacco Use  . Smoking status: Former Smoker    Packs/day: 3.00    Types: Cigarettes    Quit date: 04/20/1993    Years since quitting: 27.7  . Smokeless tobacco: Never Used  Vaping Use  . Vaping Use: Never used  Substance and Sexual Activity  . Alcohol use: Not Currently  . Drug use: No  . Sexual activity: Not Currently  Other Topics Concern  . Not on file  Social History Narrative   Alima is retired and  her 3 year old granddaughter lives with  her. She has two daughters and one son, all local that she sees regularly. She is active in church and sings in the choir. She enjoys crafting, yard work, and shopping.    Social Determinants of Health   Financial Resource Strain: Not on file  Food Insecurity: Not on file  Transportation Needs: Not on file  Physical Activity: Not on file  Stress: Not on file  Social Connections: Not on file  Intimate Partner Violence: Not on file   Family History  Problem Relation Age of Onset  . Kidney disease Mother   . Stroke Mother   . Heart attack Father   . Hyperlipidemia Sister   . Hypertension Sister   . Diabetes Sister   . Hypertension Brother   . Hyperlipidemia Brother   . Hypertension Sister   . Hyperlipidemia Sister   . Colon cancer Neg Hx     Objective: Office vital signs reviewed. BP 129/83   Pulse 81   Temp 97.8 F (36.6 C)   Ht 5\' 7"  (1.702 m)   Wt 174 lb 9.6 oz (79.2 kg)   SpO2 97%   BMI 27.35 kg/m   Physical Examination:  General: Awake, alert, well nourished, No acute distress Extremities: warm, well perfused, No edema, cyanosis or clubbing; +2 pulses bilaterally MSK: Ambulating independently.  Left elbow: No appreciable soft tissue swelling, bursa inflammation.  Nontender palpation.  No palpable bony abnormalities.  Has full extension and flexion of the elbow.  Assessment/ Plan: 75 y.o. female   DDD (degenerative disc disease), lumbar - Plan: traMADol (ULTRAM) 50 MG tablet, ToxASSURE Select 13 (MW), Urine  Controlled substance agreement signed - Plan: ToxASSURE Select 13 (MW), Urine  Left elbow tendinitis  Pain is stable with sparing use of tramadol.  No red flags.  National narcotic database was reviewed  Exam unremarkable of the left elbow.  Suspect tendinitis.  Home care instructions reviewed with the patient.  No orders of the defined types were placed in this encounter.  No orders of the defined types were placed in this encounter.    66, DO [facility] [phone]

## 2021-01-14 NOTE — Patient Instructions (Signed)
Tendinitis  Tendinitis is swelling (inflammation) of a tendon. A tendon is a cord of tissue that connects muscle to bone. Tendinitis can cause pain, tenderness, and swelling. What are the causes?  Using a tendon or muscle too much (overuse). This is a common cause.  Wear and tear that happens as you age.  Injury.  Some medical conditions, such as arthritis.  Some medicines. What increases the risk? You are more likely to get this condition if you do activities that involve the same movements over and over again (repetitive motions). What are the signs or symptoms?  Pain.  Tenderness.  Mild swelling.  Decreased range of motion. How is this treated? This condition is usually treated with RICE therapy. RICE stands for:  Rest.  Ice.  Compression. This means putting pressure on the affected area.  Elevation. This means raising the affected area above the level of your heart. Treatment may also include:  Medicines for swelling or pain.  Exercises or physical therapy.  A brace or splint.  Surgery. This is rarely needed. Follow these instructions at home: If you have a splint or brace:  Wear the splint or brace as told by your doctor. Remove it only as told by your doctor.  Loosen the splint or brace if your fingers or toes: ? Tingle. ? Become numb. ? Turn cold and blue.  Keep the splint or brace clean.  If the splint or brace is not waterproof: ? Do not let it get wet. ? Cover it with a watertight covering when you take a bath or shower. Managing pain, stiffness, and swelling  If told, put ice on the affected area. ? If you have a removable splint or brace, remove it as told by your doctor. ? Put ice in a plastic bag. ? Place a towel between your skin and the bag. ? Leave the ice on for 20 minutes, 2-3 times a day.  Move the fingers or toes of the affected arm or leg often, if this applies. This helps to prevent stiffness and to lessen swelling.  If told,  raise the affected area above the level of your heart while you are sitting or lying down.  If told, put heat on the affected area before you exercise. Use the heat source that your doctor recommends, such as a moist heat pack or a heating pad. ? Place a towel between your skin and the heat source. ? Leave the heat on for 20-30 minutes. ? Remove the heat if your skin turns bright red. This is very important if you are unable to feel pain, heat, or cold. You may have a greater risk of getting burned.      Driving  Do not drive or use heavy machinery while taking prescription pain medicine.  Ask your doctor when it is safe to drive if you have a splint or brace on any part of your arm or leg. Activity  Rest the affected area as told by your doctor.  Return to your normal activities as told by your doctor. Ask your doctor what activities are safe for you.  Avoid using the affected area while you have symptoms.  Do exercises as told by your doctor. General instructions  If you have a splint, do not put pressure on any part of the splint until it is fully hardened. This may take several hours.  Wear an elastic bandage or pressure (compression) wrap only as told by your doctor.  Take over-the-counter and prescription medicines only   as told by your doctor.  Keep all follow-up visits as told by your doctor. This is important. Contact a doctor if:  You do not get better.  You get new problems, such as numbness in your hands, and you do not know why. Summary  Tendinitis is swelling (inflammation) of a tendon.  You are more likely to get this condition if you do activities that involve the same movements over and over again.  This condition is usually treated with RICE therapy. RICE stands for rest, ice, compression, and elevate.  Avoid using the affected area while you have symptoms. This information is not intended to replace advice given to you by your health care provider. Make  sure you discuss any questions you have with your health care provider. Document Revised: 02/23/2018 Document Reviewed: 01/06/2018 Elsevier Patient Education  2021 [employer].

## 2021-01-19 ENCOUNTER — Other Ambulatory Visit: Payer: Self-pay | Admitting: Family Medicine

## 2021-01-19 DIAGNOSIS — M170 Bilateral primary osteoarthritis of knee: Secondary | ICD-10-CM

## 2021-01-19 LAB — TOXASSURE SELECT 13 (MW), URINE

## 2021-01-31 ENCOUNTER — Telehealth: Payer: Self-pay | Admitting: Orthopedic Surgery

## 2021-01-31 NOTE — Telephone Encounter (Signed)
Yes, per last visit in March of 2022: Chief complaint  LEFT HIP PAIN.  Xrays were done, and patient said she had an injection at that time. If patient is to make a separate appointment, she is fine scheduling it separately.

## 2021-01-31 NOTE — Telephone Encounter (Signed)
Patient called to ask if she may have her hip and her non-surgical knee injections done at the same appointment as her Xray follow up for total knee, which is scheduled 02/25/21. Please advise.

## 2021-01-31 NOTE — Telephone Encounter (Signed)
Hip injections ????

## 2021-02-01 NOTE — Telephone Encounter (Signed)
Called back to patient; added this update to her scheduled appointment per Dr Mort Sawyers approval.

## 2021-02-25 ENCOUNTER — Ambulatory Visit: Payer: Medicare HMO | Admitting: Orthopedic Surgery

## 2021-02-25 ENCOUNTER — Ambulatory Visit: Payer: Medicare HMO

## 2021-02-25 ENCOUNTER — Encounter: Payer: Self-pay | Admitting: Orthopedic Surgery

## 2021-02-25 ENCOUNTER — Other Ambulatory Visit: Payer: Self-pay

## 2021-02-25 VITALS — BP 151/86 | HR 90 | Ht 67.0 in | Wt 170.0 lb

## 2021-02-25 DIAGNOSIS — G8929 Other chronic pain: Secondary | ICD-10-CM

## 2021-02-25 DIAGNOSIS — Z96652 Presence of left artificial knee joint: Secondary | ICD-10-CM | POA: Diagnosis not present

## 2021-02-25 DIAGNOSIS — M1712 Unilateral primary osteoarthritis, left knee: Secondary | ICD-10-CM | POA: Diagnosis not present

## 2021-02-25 DIAGNOSIS — M7062 Trochanteric bursitis, left hip: Secondary | ICD-10-CM

## 2021-02-25 DIAGNOSIS — M25561 Pain in right knee: Secondary | ICD-10-CM

## 2021-02-25 NOTE — Progress Notes (Signed)
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   2-year follow-up left total knee with a Sigma PS knee she is doing well with the knee no problems today  Her range of motion is excellent her x-ray shows normal alignment without loosening  She has 2 other issues she would like to have her hip injected for chronic bursitis and she would also like to have her other knee injected as she is not interested in having any surgery at this time  Injection right knee  Medication Depo-Medrol 40 mg lidocaine 1% 2 cc  Skin prep ethyl chloride and alcohol  Knee injection was performed without complication  Injection left hip  Depo-Medrol 40 mg and lidocaine 1% 2 cc  Skin prep ethyl chloride and alcohol  Hip injection was performed without complication  Fu 2 yrs

## 2021-02-25 NOTE — Patient Instructions (Signed)

## 2021-03-05 ENCOUNTER — Ambulatory Visit (INDEPENDENT_AMBULATORY_CARE_PROVIDER_SITE_OTHER): Payer: Medicare HMO | Admitting: Nurse Practitioner

## 2021-03-05 ENCOUNTER — Encounter: Payer: Self-pay | Admitting: Nurse Practitioner

## 2021-03-05 VITALS — Temp 97.5°F

## 2021-03-05 DIAGNOSIS — J069 Acute upper respiratory infection, unspecified: Secondary | ICD-10-CM | POA: Insufficient documentation

## 2021-03-05 MED ORDER — SALINE SPRAY 0.65 % NA SOLN: 1.0000 | NASAL | Status: DC | PRN

## 2021-03-05 MED ORDER — MOMETASONE FUROATE 50 MCG/ACT NA SUSP: 2.0000 | Freq: Every day | NASAL | 12 refills | Status: DC

## 2021-03-05 MED ORDER — DM-GUAIFENESIN ER 30-600 MG PO TB12: 1.0000 | ORAL_TABLET | Freq: Two times a day (BID) | ORAL | 0 refills | Status: DC

## 2021-03-05 NOTE — Assessment & Plan Note (Signed)
Increase hydration, Nasonex nasal spray, guaifenesin for cough and congestion, Tylenol for headache and fever.

## 2021-03-05 NOTE — Progress Notes (Signed)
   Virtual Visit  Note Due to COVID-19 pandemic this visit was conducted virtually. This visit type was conducted due to national recommendations for restrictions regarding the COVID-19 Pandemic (e.g. social distancing, sheltering in place) in an effort to limit this patient's exposure and mitigate transmission in our community. All issues noted in this document were discussed and addressed.  A physical exam was not performed with this format.  I connected with Tiffany Bradley on 03/05/21 at 10:30 AM by telephone and verified that I am speaking with the correct person using two identifiers. Tiffany Bradley is currently located at home during visit. The provider, Daryll Drown, NP is located in their office at time of visit.  I discussed the limitations, risks, security and privacy concerns of performing an evaluation and management service by telephone and the availability of in person appointments. I also discussed with the patient that there may be a patient responsible charge related to this service. The patient expressed understanding and agreed to proceed.   History and Present Illness:  URI  This is a new problem. The current episode started in the past 7 days. The problem has been unchanged. There has been no fever. Associated symptoms include congestion and coughing. Pertinent negatives include no abdominal pain, chest pain, headaches, joint swelling, nausea, sinus pain or sore throat. She has tried nothing for the symptoms.     Review of Systems  HENT:  Positive for congestion. Negative for sinus pain and sore throat.   Respiratory:  Positive for cough.   Cardiovascular:  Negative for chest pain.  Gastrointestinal:  Negative for abdominal pain and nausea.  Neurological:  Negative for headaches.    Observations/Objective: Telephone visit patient did not sound to be in distress.  Assessment and Plan: Nasonex nasal spray, guaifenesin for cough and congestion, Tylenol for headache and  fever.    Follow Up Instructions: Follow-up with worsening unresolved symptoms.    I discussed the assessment and treatment plan with the patient. The patient was provided an opportunity to ask questions and all were answered. The patient agreed with the plan and demonstrated an understanding of the instructions.   The patient was advised to call back or seek an in-person evaluation if the symptoms worsen or if the condition fails to improve as anticipated.  The above assessment and management plan was discussed with the patient. The patient verbalized understanding of and has agreed to the management plan. Patient is aware to call the clinic if symptoms persist or worsen. Patient is aware when to return to the clinic for a follow-up visit. Patient educated on when it is appropriate to go to the emergency department.   Time call ended: 10:40 AM  I provided 10 minutes of  non face-to-face time during this encounter.    Daryll Drown, NP

## 2021-03-13 ENCOUNTER — Other Ambulatory Visit: Payer: Self-pay | Admitting: Family Medicine

## 2021-03-13 DIAGNOSIS — E7800 Pure hypercholesterolemia, unspecified: Secondary | ICD-10-CM

## 2021-03-13 DIAGNOSIS — N3281 Overactive bladder: Secondary | ICD-10-CM

## 2021-03-13 DIAGNOSIS — I10 Essential (primary) hypertension: Secondary | ICD-10-CM

## 2021-03-13 DIAGNOSIS — M170 Bilateral primary osteoarthritis of knee: Secondary | ICD-10-CM

## 2021-04-08 DIAGNOSIS — N3941 Urge incontinence: Secondary | ICD-10-CM | POA: Diagnosis not present

## 2021-04-08 DIAGNOSIS — E663 Overweight: Secondary | ICD-10-CM | POA: Diagnosis not present

## 2021-04-08 DIAGNOSIS — G629 Polyneuropathy, unspecified: Secondary | ICD-10-CM | POA: Diagnosis not present

## 2021-04-08 DIAGNOSIS — I10 Essential (primary) hypertension: Secondary | ICD-10-CM | POA: Diagnosis not present

## 2021-04-08 DIAGNOSIS — Z6827 Body mass index (BMI) 27.0-27.9, adult: Secondary | ICD-10-CM | POA: Diagnosis not present

## 2021-04-08 DIAGNOSIS — M1990 Unspecified osteoarthritis, unspecified site: Secondary | ICD-10-CM | POA: Diagnosis not present

## 2021-04-08 DIAGNOSIS — E785 Hyperlipidemia, unspecified: Secondary | ICD-10-CM | POA: Diagnosis not present

## 2021-04-08 DIAGNOSIS — G8929 Other chronic pain: Secondary | ICD-10-CM | POA: Diagnosis not present

## 2021-04-08 DIAGNOSIS — J309 Allergic rhinitis, unspecified: Secondary | ICD-10-CM | POA: Diagnosis not present

## 2021-04-08 DIAGNOSIS — K219 Gastro-esophageal reflux disease without esophagitis: Secondary | ICD-10-CM | POA: Diagnosis not present

## 2021-05-13 DIAGNOSIS — Z0100 Encounter for examination of eyes and vision without abnormal findings: Secondary | ICD-10-CM | POA: Diagnosis not present

## 2021-05-13 DIAGNOSIS — H52229 Regular astigmatism, unspecified eye: Secondary | ICD-10-CM | POA: Diagnosis not present

## 2021-05-13 DIAGNOSIS — E7800 Pure hypercholesterolemia, unspecified: Secondary | ICD-10-CM | POA: Diagnosis not present

## 2021-05-14 ENCOUNTER — Other Ambulatory Visit: Payer: Self-pay | Admitting: Family Medicine

## 2021-05-15 ENCOUNTER — Other Ambulatory Visit: Payer: Self-pay

## 2021-05-15 ENCOUNTER — Ambulatory Visit (INDEPENDENT_AMBULATORY_CARE_PROVIDER_SITE_OTHER): Payer: Medicare HMO | Admitting: Family Medicine

## 2021-05-15 ENCOUNTER — Encounter: Payer: Self-pay | Admitting: Family Medicine

## 2021-05-15 VITALS — BP 125/80 | HR 79 | Temp 98.2°F | Ht 67.0 in | Wt 170.8 lb

## 2021-05-15 DIAGNOSIS — Z96652 Presence of left artificial knee joint: Secondary | ICD-10-CM | POA: Diagnosis not present

## 2021-05-15 DIAGNOSIS — I10 Essential (primary) hypertension: Secondary | ICD-10-CM

## 2021-05-15 DIAGNOSIS — M5136 Other intervertebral disc degeneration, lumbar region: Secondary | ICD-10-CM

## 2021-05-15 DIAGNOSIS — E7800 Pure hypercholesterolemia, unspecified: Secondary | ICD-10-CM | POA: Diagnosis not present

## 2021-05-15 DIAGNOSIS — Z79899 Other long term (current) drug therapy: Secondary | ICD-10-CM

## 2021-05-15 DIAGNOSIS — N3281 Overactive bladder: Secondary | ICD-10-CM | POA: Diagnosis not present

## 2021-05-15 MED ORDER — AMLODIPINE BESYLATE 10 MG PO TABS: 10.0000 mg | ORAL_TABLET | Freq: Every day | ORAL | 3 refills | Status: DC

## 2021-05-15 MED ORDER — TOLTERODINE TARTRATE ER 2 MG PO CP24: 2.0000 mg | ORAL_CAPSULE | Freq: Every day | ORAL | 3 refills | Status: DC

## 2021-05-15 MED ORDER — GABAPENTIN 300 MG PO CAPS: 300.0000 mg | ORAL_CAPSULE | Freq: Two times a day (BID) | ORAL | 3 refills | Status: DC

## 2021-05-15 MED ORDER — DICLOFENAC SODIUM 75 MG PO TBEC: DELAYED_RELEASE_TABLET | ORAL | 1 refills | Status: DC

## 2021-05-15 MED ORDER — ATORVASTATIN CALCIUM 20 MG PO TABS: 20.0000 mg | ORAL_TABLET | Freq: Every day | ORAL | 3 refills | Status: DC

## 2021-05-15 MED ORDER — OMEPRAZOLE 40 MG PO CPDR: 40.0000 mg | DELAYED_RELEASE_CAPSULE | Freq: Every day | ORAL | 3 refills | Status: DC

## 2021-05-15 NOTE — Progress Notes (Signed)
Subjective: CC: Follow-up chronic pain PCP: Tiffany Ip, DO HPI:Tiffany Bradley is a 75 y.o. female presenting to clinic today for:  1.  DDD Patient with known DDD of the lumbar spine.  She also suffers from chronic pain of the right knee and has a history of a left-sided knee replacement.  She is followed closely by orthopedics for this.  I have treated her with tramadol, Voltaren, gabapentin and muscle relaxers as needed.  She very seldomly uses the muscle relaxer or tramadol because its very sedating.  She sometimes takes Tylenol if needed for breakthrough pain.  She religiously uses the Voltaren and gabapentin.  Denies any GI upset, daytime sedation with this medicine.  She finds the gabapentin and Voltaren helped relieve some of that left knee stiffness.  She tries to stay physically active.  In fact she has a part-time job working as a Advertising copywriter for a Nurse, adult.  She really enjoys this.    ROS: Per HPI  No Known Allergies Past Medical History:  Diagnosis Date   Arthritis    Back pain    Cataract    DJD (degenerative joint disease)    Full dentures    GERD (gastroesophageal reflux disease)    Glaucoma    History of bleeding ulcers    patient reports multiple hospitalizations remotely   Hypertension    Wears glasses     Current Outpatient Medications:    amLODipine (NORVASC) 10 MG tablet, Take 1 tablet by mouth once daily, Disp: 90 tablet, Rfl: 0   aspirin EC 325 MG EC tablet, Take 1 tablet (325 mg total) by mouth daily with breakfast., Disp: 30 tablet, Rfl: 0   atorvastatin (LIPITOR) 20 MG tablet, Take 1 tablet by mouth once daily, Disp: 90 tablet, Rfl: 0   Calcium Carb-Cholecalciferol (CALCIUM 600+D3 PO), Take 1 tablet by mouth daily., Disp: , Rfl:    denosumab (PROLIA) 60 MG/ML SOSY injection, Bring to your doctor to inject 60mg  subcutaneously once., Disp: 1 mL, Rfl: 0   dextromethorphan-guaiFENesin (MUCINEX DM) 30-600 MG 12hr tablet, Take 1 tablet by  mouth 2 (two) times daily., Disp: 30 tablet, Rfl: 0   diclofenac (VOLTAREN) 75 MG EC tablet, TAKE 1 TABLET BY MOUTH TWICE DAILY AS NEEDED FOR MODERATE PAIN, Disp: 60 tablet, Rfl: 2   ferrous sulfate 325 (65 FE) MG tablet, Take 325 mg by mouth daily., Disp: , Rfl:    gabapentin (NEURONTIN) 300 MG capsule, Take 1 capsule (300 mg total) by mouth 2 (two) times daily., Disp: 60 capsule, Rfl: 12   Glucosamine HCl-MSM (GLUCOSAMINE-MSM PO), Take 2 tablets by mouth daily., Disp: , Rfl:    mometasone (NASONEX) 50 MCG/ACT nasal spray, Place 2 sprays into the nose daily., Disp: 1 each, Rfl: 12   Multiple Vitamins-Minerals (MULTI-VITAMIN GUMMIES PO), Take 2 each by mouth daily. , Disp: , Rfl:    omeprazole (PRILOSEC) 40 MG capsule, Take 1 capsule by mouth once daily, Disp: 90 capsule, Rfl: 0   PEG 400 Monostearate POWD, Take 17 g by mouth daily., Disp: 454 g, Rfl: 5   sodium chloride (OCEAN) 0.65 % SOLN nasal spray, Place 1 spray into the nose as needed for congestion., Disp: 60 mL, Rfl: ML   sucralfate (CARAFATE) 1 g tablet, TAKE 1 TABLET BY MOUTH 4 TIMES DAILY WITH MEALS AND AT BEDTIME, Disp: 120 tablet, Rfl: 3   tiZANidine (ZANAFLEX) 4 MG tablet, TAKE 1/2 TO 1 (ONE-HALF TO ONE) TABLET BY MOUTH EVERY 8 HOURS  AS NEEDED FOR MUSCLE SPASM. USE SPARINGLY, Disp: 60 tablet, Rfl: 2   tolterodine (DETROL LA) 2 MG 24 hr capsule, Take 1 capsule by mouth once daily, Disp: 90 capsule, Rfl: 0   traMADol (ULTRAM) 50 MG tablet, Take 1 tablet (50 mg total) by mouth every 12 (twelve) hours as needed for moderate pain or severe pain. Put on file, Disp: 60 tablet, Rfl: 2 Social History   Socioeconomic History   Marital status: Divorced    Spouse name: Not on file   Number of children: 3   Years of education: 11   Highest education level: Not on file  Occupational History   Occupation: Retired    Associate Professor: [employer]  Tobacco Use   Smoking status: Former    Packs/day: 3.00    Types: Cigarettes    Quit date:  04/20/1993    Years since quitting: 28.0   Smokeless tobacco: Never  Vaping Use   Vaping Use: Never used  Substance and Sexual Activity   Alcohol use: Not Currently   Drug use: No   Sexual activity: Not Currently  Other Topics Concern   Not on file  Social History Narrative   Tiffany Bradley is retired and  her 69 year old granddaughter lives with her. She has two daughters and one son, all local that she sees regularly. She is active in church and sings in the choir. She enjoys crafting, yard work, and shopping.    Social Determinants of Health   Financial Resource Strain: Not on file  Food Insecurity: Not on file  Transportation Needs: Not on file  Physical Activity: Not on file  Stress: Not on file  Social Connections: Not on file  Intimate Partner Violence: Not on file   Family History  Problem Relation Age of Onset   Kidney disease Mother    Stroke Mother    Heart attack Father    Hyperlipidemia Sister    Hypertension Sister    Diabetes Sister    Hypertension Brother    Hyperlipidemia Brother    Hypertension Sister    Hyperlipidemia Sister    Colon cancer Neg Hx     Objective: Office vital signs reviewed. BP 125/80   Pulse 79   Temp 98.2 F (36.8 C)   Ht 5\' 7"  (1.702 m)   Wt 170 lb 12.8 oz (77.5 kg)   SpO2 96%   BMI 26.75 kg/m   Physical Examination:  General: Awake, alert, well appearing, well nourished, No acute distress MSK: Valgus deformity of the knee noted.  Patient is ambulating independently.  Assessment/ Plan: 75 y.o. 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  Does not need any tramadol  refills at this time.  Her controlled substance contract was updated but UDS was already in place and appropriate  We discussed adding Tylenol arthritis to her twice daily regimen if needed.  Stay mobile.  Hypertension, hyperlipidemia and overactive bladder were not reviewed today.  Refills were needed and these were sent.  She can follow-up in 6 months for annual physical fasting labs, sooner if needed  No orders of the defined types were placed in this encounter.  No orders of  the defined types were placed in this encounter.    Janora Norlander, DO [place] [phone]

## 2021-05-16 ENCOUNTER — Ambulatory Visit (INDEPENDENT_AMBULATORY_CARE_PROVIDER_SITE_OTHER): Payer: Medicare HMO

## 2021-05-16 ENCOUNTER — Telehealth: Payer: Self-pay

## 2021-05-16 VITALS — Ht 67.0 in | Wt 170.0 lb

## 2021-05-16 DIAGNOSIS — Z0000 Encounter for general adult medical examination without abnormal findings: Secondary | ICD-10-CM | POA: Diagnosis not present

## 2021-05-16 NOTE — Patient Instructions (Signed)
Tiffany Bradley , Thank you for taking time to come for your Medicare Wellness Visit. I appreciate your ongoing commitment to your health goals. Please review the following plan we discussed and let me know if I can assist you in the future.   Screening recommendations/referrals: Colonoscopy: Done 01/16/2017 - Repeat in 10 years Mammogram: one 10/24/2020 - Repeat annually Bone Density: Done 12/12/2019 - Repeat every 2 years  Recommended yearly ophthalmology/optometry visit for glaucoma screening and checkup Recommended yearly dental visit for hygiene and checkup  Vaccinations: Influenza vaccine: Declined - recommend every fall Pneumococcal vaccine: Declined - recommend 2 doses one year apart Tdap vaccine:  Declined. Recommend every 10 years Shingles vaccine:  Shingrix discussed. Please contact your pharmacy for coverage information.   Covid-19: Done 10/06/19, 11/04/19, 06/23/20, & 12/03/20  Advanced directives: Please bring a copy of your health care power of attorney and living will to the office to be added to your chart at your convenience.   Conditions/risks identified: Aim for 30 minutes of exercise or brisk walking each day, drink 6-8 glasses of water and eat lots of fruits and vegetables.  Next appointment: Follow up in one year for your annual wellness visit .   Preventive Care 14 Years and Older, Female Preventive care refers to lifestyle choices and visits with your health care provider that can promote health and wellness. What does preventive care include? A yearly physical exam. This is also called an annual well check. Dental exams once or twice a year. Routine eye exams. Ask your health care provider how often you should have your eyes checked. Personal lifestyle choices, including: Daily care of your teeth and gums. Regular physical activity. Eating a healthy diet. Avoiding tobacco and drug use. Limiting alcohol use. Practicing safe sex. Taking low-dose aspirin every  day. Taking vitamin and mineral supplements as recommended by your health care provider. What happens during an annual well check? The services and screenings done by your health care provider during your annual well check will depend on your age, overall health, lifestyle risk factors, and family history of disease. Counseling  Your health care provider may ask you questions about your: Alcohol use. Tobacco use. Drug use. Emotional well-being. Home and relationship well-being. Sexual activity. Eating habits. History of falls. Memory and ability to understand (cognition). Work and work Astronomer. Reproductive health. Screening  You may have the following tests or measurements: Height, weight, and BMI. Blood pressure. Lipid and cholesterol levels. These may be checked every 5 years, or more frequently if you are over 30 years old. Skin check. Lung cancer screening. You may have this screening every year starting at age 81 if you have a 30-pack-year history of smoking and currently smoke or have quit within the past 15 years. Fecal occult blood test (FOBT) of the stool. You may have this test every year starting at age 26. Flexible sigmoidoscopy or colonoscopy. You may have a sigmoidoscopy every 5 years or a colonoscopy every 10 years starting at age 60. Hepatitis C blood test. Hepatitis B blood test. Sexually transmitted disease (STD) testing. Diabetes screening. This is done by checking your blood sugar (glucose) after you have not eaten for a while (fasting). You may have this done every 1-3 years. Bone density scan. This is done to screen for osteoporosis. You may have this done starting at age 65. Mammogram. This may be done every 1-2 years. Talk to your health care provider about how often you should have regular mammograms. Talk with your health  care provider about your test results, treatment options, and if necessary, the need for more tests. Vaccines  Your health care  provider may recommend certain vaccines, such as: Influenza vaccine. This is recommended every year. Tetanus, diphtheria, and acellular pertussis (Tdap, Td) vaccine. You may need a Td booster every 10 years. Zoster vaccine. You may need this after age 34. Pneumococcal 13-valent conjugate (PCV13) vaccine. One dose is recommended after age 53. Pneumococcal polysaccharide (PPSV23) vaccine. One dose is recommended after age 14. Talk to your health care provider about which screenings and vaccines you need and how often you need them. This information is not intended to replace advice given to you by your health care provider. Make sure you discuss any questions you have with your health care provider. Document Released: 09/14/2015 Document Revised: 05/07/2016 Document Reviewed: 06/19/2015 Elsevier Interactive Patient Education  2017 [place] Prevention in the Home Falls can cause injuries. They can happen to people of all ages. There are many things you can do to make your home safe and to help prevent falls. What can I do on the outside of my home? Regularly fix the edges of walkways and driveways and fix any cracks. Remove anything that might make you trip as you walk through a door, such as a raised step or threshold. Trim any bushes or trees on the path to your home. Use bright outdoor lighting. Clear any walking paths of anything that might make someone trip, such as rocks or tools. Regularly check to see if handrails are loose or broken. Make sure that both sides of any steps have handrails. Any raised decks and porches should have guardrails on the edges. Have any leaves, snow, or ice cleared regularly. Use sand or salt on walking paths during winter. Clean up any spills in your garage right away. This includes oil or grease spills. What can I do in the bathroom? Use night lights. Install grab bars by the toilet and in the tub and shower. Do not use towel bars as grab  bars. Use non-skid mats or decals in the tub or shower. If you need to sit down in the shower, use a plastic, non-slip stool. Keep the floor dry. Clean up any water that spills on the floor as soon as it happens. Remove soap buildup in the tub or shower regularly. Attach bath mats securely with double-sided non-slip rug tape. Do not have throw rugs and other things on the floor that can make you trip. What can I do in the bedroom? Use night lights. Make sure that you have a light by your bed that is easy to reach. Do not use any sheets or blankets that are too big for your bed. They should not hang down onto the floor. Have a firm chair that has side arms. You can use this for support while you get dressed. Do not have throw rugs and other things on the floor that can make you trip. What can I do in the kitchen? Clean up any spills right away. Avoid walking on wet floors. Keep items that you use a lot in easy-to-reach places. If you need to reach something above you, use a strong step stool that has a grab bar. Keep electrical cords out of the way. Do not use floor polish or wax that makes floors slippery. If you must use wax, use non-skid floor wax. Do not have throw rugs and other things on the floor that can make you trip. What can  I do with my stairs? Do not leave any items on the stairs. Make sure that there are handrails on both sides of the stairs and use them. Fix handrails that are broken or loose. Make sure that handrails are as long as the stairways. Check any carpeting to make sure that it is firmly attached to the stairs. Fix any carpet that is loose or worn. Avoid having throw rugs at the top or bottom of the stairs. If you do have throw rugs, attach them to the floor with carpet tape. Make sure that you have a light switch at the top of the stairs and the bottom of the stairs. If you do not have them, ask someone to add them for you. What else can I do to help prevent  falls? Wear shoes that: Do not have high heels. Have rubber bottoms. Are comfortable and fit you well. Are closed at the toe. Do not wear sandals. If you use a stepladder: Make sure that it is fully opened. Do not climb a closed stepladder. Make sure that both sides of the stepladder are locked into place. Ask someone to hold it for you, if possible. Clearly mark and make sure that you can see: Any grab bars or handrails. First and last steps. Where the edge of each step is. Use tools that help you move around (mobility aids) if they are needed. These include: Canes. Walkers. Scooters. Crutches. Turn on the lights when you go into a dark area. Replace any light bulbs as soon as they burn out. Set up your furniture so you have a clear path. Avoid moving your furniture around. If any of your floors are uneven, fix them. If there are any pets around you, be aware of where they are. Review your medicines with your doctor. Some medicines can make you feel dizzy. This can increase your chance of falling. Ask your doctor what other things that you can do to help prevent falls. This information is not intended to replace advice given to you by your health care provider. Make sure you discuss any questions you have with your health care provider. Document Released: 06/14/2009 Document Revised: 01/24/2016 Document Reviewed: 09/22/2014 Elsevier Interactive Patient Education  2017 [employer].

## 2021-05-16 NOTE — Telephone Encounter (Signed)
Pt called - she wishes not to have the test.

## 2021-05-16 NOTE — Telephone Encounter (Signed)
Glad to order but she was tested for it in 2018 on biopsy from her stomach during EGD with GI and it was negative.  Let me know if she still wants to be tested.  It's a stool test.

## 2021-05-16 NOTE — Progress Notes (Signed)
Subjective:   Tiffany Bradley is a 75 y.o. female who presents for Medicare Annual (Subsequent) preventive examination.  Virtual Visit via Telephone Note  I connected with  [name] on 05/16/21 at  8:15 AM EDT by telephone and verified that I am speaking with the correct person using two identifiers.  Location: Patient: Home Provider: [facility] Persons participating in the virtual visit: patient/Nurse Health Advisor   I discussed the limitations, risks, security and privacy concerns of performing an evaluation and management service by telephone and the availability of in person appointments. The patient expressed understanding and agreed to proceed.  Interactive audio and video telecommunications were attempted between this nurse and patient, however failed, due to patient having technical difficulties OR patient did not have access to video capability.  We continued and completed visit with audio only.  Some vital signs may be absent or patient reported.   Rivaldo Hineman E Donnalee Cellucci, LPN   Review of Systems     Cardiac Risk Factors include: advanced age (>55men, >10 women);hypertension;dyslipidemia     Objective:    Today's Vitals   05/16/21 0810  Weight: 170 lb (77.1 kg)  Height: 5\' 7"  (1.702 m)   Body mass index is 26.63 kg/m.  Advanced Directives 05/16/2021 11/09/2019 05/11/2019 10/18/2018 02/11/2018 02/11/2018 02/08/2018  Does Patient Have a Medical Advance Directive? Yes No No No No No No  Type of 04/10/2018 of [place];Living will - - - - - -  Copy of Healthcare Power of Attorney in Chart? No - copy requested - - - - - -  Would patient like information on creating a medical advance directive? - No - Patient declined - Yes (MAU/Ambulatory/Procedural Areas - Information given) No - Patient declined No - Patient declined No - Patient declined  Pre-existing out of facility DNR order (yellow form or pink MOST form) - - - - - - -    Current Medications  (verified) Outpatient Encounter Medications as of 05/16/2021  Medication Sig   amLODipine (NORVASC) 10 MG tablet Take 1 tablet (10 mg total) by mouth daily.   aspirin EC 325 MG EC tablet Take 1 tablet (325 mg total) by mouth daily with breakfast.   atorvastatin (LIPITOR) 20 MG tablet Take 1 tablet (20 mg total) by mouth daily.   Calcium Carb-Cholecalciferol (CALCIUM 600+D3 PO) Take 1 tablet by mouth daily.   diclofenac (VOLTAREN) 75 MG EC tablet TAKE 1 TABLET BY MOUTH TWICE DAILY AS NEEDED FOR MODERATE PAIN   ferrous sulfate 325 (65 FE) MG tablet Take 325 mg by mouth daily.   gabapentin (NEURONTIN) 300 MG capsule Take 1 capsule (300 mg total) by mouth 2 (two) times daily.   Glucosamine HCl-MSM (GLUCOSAMINE-MSM PO) Take 2 tablets by mouth daily.   Multiple Vitamins-Minerals (MULTI-VITAMIN GUMMIES PO) Take 2 each by mouth daily.    omeprazole (PRILOSEC) 40 MG capsule Take 1 capsule (40 mg total) by mouth daily.   PEG 400 Monostearate POWD Take 17 g by mouth daily.   sodium chloride (OCEAN) 0.65 % SOLN nasal spray Place 1 spray into the nose as needed for congestion.   sucralfate (CARAFATE) 1 g tablet TAKE 1 TABLET BY MOUTH 4 TIMES DAILY WITH MEALS AND AT BEDTIME   tiZANidine (ZANAFLEX) 4 MG tablet TAKE 1/2 TO 1 (ONE-HALF TO ONE) TABLET BY MOUTH EVERY 8 HOURS AS NEEDED FOR MUSCLE SPASM. USE SPARINGLY   tolterodine (DETROL LA) 2 MG 24 hr capsule Take 1 capsule (2 mg total) by  mouth daily.   traMADol (ULTRAM) 50 MG tablet Take 1 tablet (50 mg total) by mouth every 12 (twelve) hours as needed for moderate pain or severe pain. Put on file   denosumab (PROLIA) 60 MG/ML SOSY injection Bring to your doctor to inject  subcutaneously once. (Patient not taking: Reported on 05/16/2021)   dextromethorphan-guaiFENesin (MUCINEX DM) 30-600 MG 12hr tablet Take 1 tablet by mouth 2 (two) times daily. (Patient not taking: Reported on 05/16/2021)   mometasone (NASONEX) 50 MCG/ACT nasal spray Place 2 sprays into the  nose daily. (Patient not taking: Reported on 05/16/2021)   No facility-administered encounter medications on file as of 05/16/2021.    Allergies (verified) Patient has no known allergies.   History: Past Medical History:  Diagnosis Date   Arthritis    Back pain    Cataract    DJD (degenerative joint disease)    Full dentures    GERD (gastroesophageal reflux disease)    Glaucoma    History of bleeding ulcers    patient reports multiple hospitalizations remotely   Hypertension    Wears glasses    Past Surgical History:  Procedure Laterality Date   BREAST REDUCTION SURGERY Bilateral 04/25/2013   Procedure: MAMMARY REDUCTION  (BREAST);  Surgeon: Louisa Second, MD;  Location: [facility];  Service: Plastics;  Laterality: Bilateral;   COLONOSCOPY     2008, normal per patient   COLONOSCOPY N/A 01/16/2017   Procedure: COLONOSCOPY;  Surgeon: West Bali, MD;  Location: [facility];  Service: Endoscopy;  Laterality: N/A;  830    COSMETIC SURGERY     ESOPHAGOGASTRODUODENOSCOPY N/A 01/16/2017   Procedure: ESOPHAGOGASTRODUODENOSCOPY (EGD);  Surgeon: West Bali, MD;  Location: [facility];  Service: Endoscopy;  Laterality: N/A;   PARTIAL COLECTOMY N/A 06/29/2016   Procedure: PARTIAL COLECTOMY;  Surgeon: Ancil Linsey, MD;  Location: [facility];  Service: General;  Laterality: N/A;   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   Procedure: TOTAL KNEE ARTHROPLASTY;  Surgeon: Vickki Hearing, MD;  Location: [facility];  Service: Orthopedics;  Laterality: Left;  De[place]uy    TUBAL LIGATION     Family History  Problem Relation Age of Onset   Kidney disease Mother    Stroke Mother    Heart attack Father    Hyperlipidemia Sister    Hypertension Sister    Diabetes Sister    Hypertension Brother    Hyperlipidemia Brother    Hypertension Sister    Hyperlipidemia Sister    Colon cancer Neg Hx    Social History   Socioeconomic History   Marital status: Divorced     Spouse name: Not on file   Number of children: 3   Years of education: 11   Highest education level: Not on file  Occupational History   Occupation: Retired    Associate Professor: [employer]  Tobacco Use   Smoking status: Former    Packs/day: 3.00    Types: Cigarettes    Quit date: 04/20/1993    Years since quitting: 28.0   Smokeless tobacco: Never  Vaping Use   Vaping Use: Never used  Substance and Sexual Activity   Alcohol use: Not Currently   Drug use: No   Sexual activity: Not Currently  Other Topics Concern   Not on file  Social History Narrative   Katee is retired and  her 68 year old granddaughter lives with her. She has two daughters and one son, all local that she sees regularly.  She is active in church and sings in the choir. She enjoys crafting, yard work, and shopping.    Social Determinants of Health   Financial Resource Strain: Low Risk    Difficulty of Paying Living Expenses: Not hard at all  Food Insecurity: No Food Insecurity   Worried About Programme researcher, broadcasting/film/video in the Last Year: Never true   Ran Out of Food in the Last Year: Never true  Transportation Needs: No Transportation Needs   Lack of Transportation (Medical): No   Lack of Transportation (Non-Medical): No  Physical Activity: Sufficiently Active   Days of Exercise per Week: 7 days   Minutes of Exercise per Session: 30 min  Stress: No Stress Concern Present   Feeling of Stress : Not at all  Social Connections: Moderately Integrated   Frequency of Communication with Friends and Family: More than three times a week   Frequency of Social Gatherings with Friends and Family: More than three times a week   Attends Religious Services: More than 4 times per year   Active Member of [employer] or Organizations: Yes   Attends Engineer, structural: More than 4 times per year   Marital Status: Divorced    Tobacco Counseling Counseling given: Not Answered   Clinical Intake:  Pre-visit preparation completed:  Yes  Pain : No/denies pain     BMI - recorded: 26.63 Nutritional Status: BMI 25 -29 Overweight Nutritional Risks: None Diabetes: No  How often do you need to have someone help you when you read instructions, pamphlets, or other written materials from your doctor or pharmacy?: 1 - Never  Diabetic? No  Interpreter Needed?: No  Information entered by :: Malaya Cagley, LPN   Activities of Daily Living In your present state of health, do you have any difficulty performing the following activities: 05/16/2021  Hearing? N  Vision? N  Difficulty concentrating or making decisions? N  Walking or climbing stairs? Y  Dressing or bathing? N  Doing errands, shopping? N  Preparing Food and eating ? N  Using the Toilet? N  In the past six months, have you accidently leaked urine? N  Do you have problems with loss of bowel control? N  Managing your Medications? N  Managing your Finances? N  Housekeeping or managing your Housekeeping? N  Some recent data might be hidden    Patient Care Team: Raliegh Ip, DO as PCP - General (Family Medicine) West Bali, MD (Inactive) as Consulting Physician (Gastroenterology) Vickki Hearing, MD as Consulting Physician (Orthopedic Surgery)  Indicate any recent Medical Services you may have received from other than [facility] providers in the past year (date may be approximate).     Assessment:   This is a routine wellness examination for Tiffany Bradley.  Hearing/Vision screen Hearing Screening - Comments:: Denies hearing difficulties  Vision Screening - Comments:: Wears eyeglasses - Up to date with annual eye exams at [facility] in [place]  Dietary issues and exercise activities discussed: Current Exercise Habits: Home exercise routine, Type of exercise: walking, Time (Minutes): 30, Frequency (Times/Week): 7, Weekly Exercise (Minutes/Week): 210, Intensity: Mild, Exercise limited by: orthopedic condition(s)   Goals Addressed             This  Visit's Progress    Exercise 3x per week (30 min per time)   Not on track    Exercising at the [facility] is a great option.        Depression Screen PHQ 2/9 Scores 05/16/2021 05/15/2021 01/14/2021  07/16/2020 03/12/2020 12/12/2019 11/09/2019  PHQ - 2 Score 0 0 0 0 0 0 0  PHQ- 9 Score - - - 0 0 - -    Fall Risk Fall Risk  05/16/2021 05/15/2021 01/14/2021 12/12/2019 11/09/2019  Falls in the past year? 0 0 0 0 0  Number falls in past yr: 0 - - - -  Injury with Fall? 0 - - - -  Risk for fall due to : Impaired vision;Medication side effect;Impaired balance/gait;Orthopedic patient - - - -  Follow up Falls prevention discussed;Education provided - - - -    FALL RISK PREVENTION PERTAINING TO THE HOME:  Any stairs in or around the home? Yes  If so, are there any without handrails? Yes  3 steps to go up to bedrooms - no handrail - she uses a cane on these Home free of loose throw rugs in walkways, pet beds, electrical cords, etc? Yes  Adequate lighting in your home to reduce risk of falls? Yes   ASSISTIVE DEVICES UTILIZED TO PREVENT FALLS:  Life alert? No  Use of a cane, walker or w/c? Yes  Grab bars in the bathroom? Yes  Shower chair or bench in shower? No  Elevated toilet seat or a handicapped toilet? Yes   TIMED UP AND GO:  Was the test performed? No . Telephonic visit  Cognitive Function: Normal cognitive status assessed by direct observation by this Nurse Health Advisor. No abnormalities found.    MMSE - Mini Mental State Exam 10/18/2018  Orientation to time 5  Orientation to Place 5  Registration 3  Attention/ Calculation 5  Recall 2  Language- name 2 objects 2  Language- repeat 1  Language- follow 3 step command 3  Language- read & follow direction 1  Write a sentence 1  Copy design 1  Total score 29     6CIT Screen 11/09/2019  What Year? 0 points  What month? 0 points  What time? 0 points  Count back from 20 0 points  Months in reverse 0 points  Repeat phrase 0 points   Total Score 0    Immunizations Immunization History  Administered Date(s) Administered   Moderna Sars-Covid-2 Vaccination 10/06/2019, 11/04/2019, 06/23/2020, 12/13/2020    TDAP status: Due, Education has been provided regarding the importance of this vaccine. Advised may receive this vaccine at local pharmacy or Health Dept. Aware to provide a copy of the vaccination record if obtained from local pharmacy or Health Dept. Verbalized acceptance and understanding.  Flu Vaccine status: Declined, Education has been provided regarding the importance of this vaccine but patient still declined. Advised may receive this vaccine at local pharmacy or Health Dept. Aware to provide a copy of the vaccination record if obtained from local pharmacy or Health Dept. Verbalized acceptance and understanding.  Pneumococcal vaccine status: Declined,  Education has been provided regarding the importance of this vaccine but patient still declined. Advised may receive this vaccine at local pharmacy or Health Dept. Aware to provide a copy of the vaccination record if obtained from local pharmacy or Health Dept. Verbalized acceptance and understanding.   Covid-19 vaccine status: Completed vaccines  Qualifies for Shingles Vaccine? Yes   Zostavax completed No   Shingrix Completed?: No.    Education has been provided regarding the importance of this vaccine. Patient has been advised to call insurance company to determine out of pocket expense if they have not yet received this vaccine. Advised may also receive vaccine at local pharmacy or Health Dept.  Verbalized acceptance and understanding.  Screening Tests Health Maintenance  Topic Date Due   COVID-19 Vaccine (5 - Booster for Moderna series) 05/31/2021 (Originally 04/14/2021)   Zoster Vaccines- Shingrix (1 of 2) 08/14/2021 (Originally 02/07/1965)   TETANUS/TDAP  01/14/2022 (Originally 02/07/1965)   Hepatitis C Screening  01/14/2022 (Originally 02/07/1964)   PNA vac Low  Risk Adult (1 of 2 - PCV13) 01/14/2022 (Originally 02/08/2012)   MAMMOGRAM  10/24/2021   DEXA SCAN  12/11/2021   COLONOSCOPY (Pts 45-64yrs Insurance coverage will need to be confirmed)  01/17/2027   HPV VACCINES  Aged Out   INFLUENZA VACCINE  Discontinued    Health Maintenance  There are no preventive care reminders to display for this patient.  Colorectal cancer screening: Type of screening: Colonoscopy. Completed 01/16/2017. Repeat every 10 years  Mammogram status: Completed 10/24/2020. Repeat every year  Bone Density status: Completed 12/12/2019. Results reflect: Bone density results: OSTEOPENIA. Repeat every 2 years.  Lung Cancer Screening: (Low Dose CT Chest recommended if Age 71-80 years, 30 pack-year currently smoking OR have quit w/in 15years.) does not qualify.   Additional Screening:  Hepatitis C Screening: does qualify; DUE  Vision Screening: Recommended annual ophthalmology exams for early detection of glaucoma and other disorders of the eye. Is the patient up to date with their annual eye exam?  Yes  Who is the provider or what is the name of the office in which the patient attends annual eye exams? [facility] If pt is not established with a provider, would they like to be referred to a provider to establish care? No .   Dental Screening: Recommended annual dental exams for proper oral hygiene  Community Resource Referral / Chronic Care Management: CRR required this visit?  No   CCM required this visit?  No      Plan:     I have personally reviewed and noted the following in the patient's chart:   Medical and social history Use of alcohol, tobacco or illicit drugs  Current medications and supplements including opioid prescriptions.  Functional ability and status Nutritional status Physical activity Advanced directives List of other physicians Hospitalizations, surgeries, and ER visits in previous 12 months Vitals Screenings to include cognitive,  depression, and falls Referrals and appointments  In addition, I have reviewed and discussed with patient certain preventive protocols, quality metrics, and best practice recommendations. A written personalized care plan for preventive services as well as general preventive health recommendations were provided to patient.     [place] Constable, LPN   0/27/2536   Nurse Notes: Says she has stomach ulcers - got letter in the mail from [facility] advising her to ask pcp about testing for H.Pylori to see if this is causing ulcers. Sent message to PCP.

## 2021-05-16 NOTE — Telephone Encounter (Signed)
Was seen yesterday and forgot to bring letter from [facility] recommending H.Pylori testing to see if that could be causing her stomach ulcers. She wants to know if she can get this.

## 2021-05-30 ENCOUNTER — Other Ambulatory Visit: Payer: Self-pay

## 2021-05-30 ENCOUNTER — Ambulatory Visit: Payer: Medicare HMO | Admitting: Orthopedic Surgery

## 2021-05-30 VITALS — Ht 67.0 in | Wt 170.0 lb

## 2021-05-30 DIAGNOSIS — G8929 Other chronic pain: Secondary | ICD-10-CM | POA: Diagnosis not present

## 2021-05-30 DIAGNOSIS — M7062 Trochanteric bursitis, left hip: Secondary | ICD-10-CM

## 2021-05-30 DIAGNOSIS — M25561 Pain in right knee: Secondary | ICD-10-CM | POA: Diagnosis not present

## 2021-05-30 NOTE — Patient Instructions (Signed)

## 2021-05-30 NOTE — Progress Notes (Signed)
Chief Complaint  Patient presents with   Follow-up    Recheck on right knee and left hip   Injections requested right knee joint and left hip greater trochanteric bursa  A steroid injection was performed at right knee joint using 1% plain Lidocaine and Celestone 6 mg mg of Celestone. This was well tolerated.  A steroid injection was performed at left greater trochanteric bursa using 1% plain Lidocaine and 6 mg of Celestone. This was well tolerated.  F/u prn

## 2021-06-27 ENCOUNTER — Other Ambulatory Visit: Payer: Self-pay | Admitting: Family Medicine

## 2021-06-27 DIAGNOSIS — K219 Gastro-esophageal reflux disease without esophagitis: Secondary | ICD-10-CM

## 2021-06-27 DIAGNOSIS — K279 Peptic ulcer, site unspecified, unspecified as acute or chronic, without hemorrhage or perforation: Secondary | ICD-10-CM

## 2021-07-12 ENCOUNTER — Other Ambulatory Visit: Payer: Self-pay | Admitting: Family Medicine

## 2021-07-12 DIAGNOSIS — K279 Peptic ulcer, site unspecified, unspecified as acute or chronic, without hemorrhage or perforation: Secondary | ICD-10-CM

## 2021-07-12 DIAGNOSIS — K219 Gastro-esophageal reflux disease without esophagitis: Secondary | ICD-10-CM

## 2021-07-15 ENCOUNTER — Telehealth: Payer: Self-pay | Admitting: Family Medicine

## 2021-07-15 ENCOUNTER — Other Ambulatory Visit: Payer: Self-pay | Admitting: Family Medicine

## 2021-07-15 DIAGNOSIS — K219 Gastro-esophageal reflux disease without esophagitis: Secondary | ICD-10-CM

## 2021-07-15 DIAGNOSIS — K279 Peptic ulcer, site unspecified, unspecified as acute or chronic, without hemorrhage or perforation: Secondary | ICD-10-CM

## 2021-07-15 MED ORDER — SUCRALFATE 1 G PO TABS: ORAL_TABLET | ORAL | 0 refills | Status: DC

## 2021-07-15 NOTE — Telephone Encounter (Signed)
Meds sent to correct pharm.

## 2021-07-16 NOTE — Telephone Encounter (Signed)
Refusal did not go through, want this to transmit, resent

## 2021-07-16 NOTE — Addendum Note (Signed)
Addended by: Julious Payer D on: 07/16/2021 09:23 AM   Modules accepted: Orders

## 2021-07-29 ENCOUNTER — Other Ambulatory Visit: Payer: Self-pay

## 2021-07-29 ENCOUNTER — Encounter: Payer: Self-pay | Admitting: Orthopedic Surgery

## 2021-07-29 ENCOUNTER — Ambulatory Visit (INDEPENDENT_AMBULATORY_CARE_PROVIDER_SITE_OTHER): Payer: Medicare HMO | Admitting: Orthopedic Surgery

## 2021-07-29 DIAGNOSIS — M7062 Trochanteric bursitis, left hip: Secondary | ICD-10-CM | POA: Diagnosis not present

## 2021-07-29 DIAGNOSIS — G8929 Other chronic pain: Secondary | ICD-10-CM | POA: Diagnosis not present

## 2021-07-29 DIAGNOSIS — M25561 Pain in right knee: Secondary | ICD-10-CM

## 2021-07-29 NOTE — Progress Notes (Signed)
Chief Complaint  Patient presents with   Injections    Wants RIGHT knee and left hip bursa injections     Procedure note  Injection  Verbal consent was obtained to inject the right knee  Timeout procedure was completed to confirm injection site  Diagnosis chronic pain arthritis right knee  Medications used 40 mg Depo-Medrol Lidocaine 1% plain 3 cc  Anesthesia was provided by ethyl chloride spray  Prep was performed with alcohol  Technique of injection knee was injected through a lateral approach with the knee flexed 90 degrees  No complications were noted  Procedure note  Injection  Verbal consent was obtained to inject the left greater trochanter  Timeout procedure was completed to confirm injection site  Diagnosis trochanteric bursitis left hip  Medications used 40 mg Depo-Medrol Lidocaine 1% plain 3 cc  Anesthesia was provided by ethyl chloride spray  Prep was performed with alcohol  Technique of injection patient was in the lateral decubitus position left side up point of maximal tenderness was injected  No complications were noted

## 2021-08-11 ENCOUNTER — Other Ambulatory Visit: Payer: Self-pay | Admitting: Family Medicine

## 2021-08-11 DIAGNOSIS — K219 Gastro-esophageal reflux disease without esophagitis: Secondary | ICD-10-CM

## 2021-08-11 DIAGNOSIS — K279 Peptic ulcer, site unspecified, unspecified as acute or chronic, without hemorrhage or perforation: Secondary | ICD-10-CM

## 2021-09-23 ENCOUNTER — Other Ambulatory Visit: Payer: Self-pay | Admitting: Family Medicine

## 2021-09-23 DIAGNOSIS — M5136 Other intervertebral disc degeneration, lumbar region: Secondary | ICD-10-CM

## 2021-09-23 DIAGNOSIS — M170 Bilateral primary osteoarthritis of knee: Secondary | ICD-10-CM

## 2021-09-24 NOTE — Telephone Encounter (Signed)
Needs OV for Tramadol. Controlled. Muscle relaxer renewed.

## 2021-09-25 NOTE — Telephone Encounter (Signed)
LMOVM we received request yesterday for RF on her Tramadol, this is a controlled medication. If she is needing this medication before her March appt with Dr. Nadine Counts, please call the office to make an appointment w/ Dr. Nadine Counts.

## 2021-09-30 ENCOUNTER — Ambulatory Visit: Payer: Medicare HMO | Admitting: Orthopedic Surgery

## 2021-09-30 ENCOUNTER — Other Ambulatory Visit: Payer: Self-pay

## 2021-09-30 ENCOUNTER — Encounter: Payer: Self-pay | Admitting: Orthopedic Surgery

## 2021-09-30 DIAGNOSIS — M7062 Trochanteric bursitis, left hip: Secondary | ICD-10-CM | POA: Diagnosis not present

## 2021-09-30 DIAGNOSIS — G8929 Other chronic pain: Secondary | ICD-10-CM

## 2021-09-30 DIAGNOSIS — M25561 Pain in right knee: Secondary | ICD-10-CM

## 2021-09-30 NOTE — Progress Notes (Addendum)
Chief Complaint  Patient presents with   Injections    Right knee left hip    Right knee  verbal consent was obtained to inject right knee Timeout was completed to confirm the site of injection  The medications used were 40 mg depo and 1% lidocaine 3 cc  Anesthesia was provided by ethyl chloride and the skin was prepped with alcohol.  After cleaning the skin with alcohol a 20-gauge needle was used to inject the left knee joint. There were no complications. A sterile bandage was applied.    Procedure note injection for left hip bursitis  Verbal consent was obtained for injection of the  left hip   Timeout was completed to confirm the injection site  The medications used were 40 mg of Depo-Medrol and 1% lidocaine 3 cc  Anesthesia was provided by ethyl chloride and the skin was prepped with alcohol.  After cleaning the skin with alcohol a 25-gauge needle was used to inject the left hip greater trochanteric bursa.

## 2021-11-13 ENCOUNTER — Telehealth: Payer: Self-pay | Admitting: Family Medicine

## 2021-11-13 ENCOUNTER — Other Ambulatory Visit: Payer: Self-pay

## 2021-11-13 ENCOUNTER — Encounter: Payer: Self-pay | Admitting: Family Medicine

## 2021-11-13 ENCOUNTER — Ambulatory Visit (INDEPENDENT_AMBULATORY_CARE_PROVIDER_SITE_OTHER): Payer: Medicare HMO | Admitting: Family Medicine

## 2021-11-13 VITALS — BP 117/73 | HR 88 | Temp 97.9°F | Ht 67.0 in | Wt 172.1 lb

## 2021-11-13 DIAGNOSIS — Z96652 Presence of left artificial knee joint: Secondary | ICD-10-CM | POA: Diagnosis not present

## 2021-11-13 DIAGNOSIS — K279 Peptic ulcer, site unspecified, unspecified as acute or chronic, without hemorrhage or perforation: Secondary | ICD-10-CM

## 2021-11-13 DIAGNOSIS — Z0001 Encounter for general adult medical examination with abnormal findings: Secondary | ICD-10-CM

## 2021-11-13 DIAGNOSIS — E7800 Pure hypercholesterolemia, unspecified: Secondary | ICD-10-CM | POA: Diagnosis not present

## 2021-11-13 DIAGNOSIS — Z79899 Other long term (current) drug therapy: Secondary | ICD-10-CM | POA: Diagnosis not present

## 2021-11-13 DIAGNOSIS — M810 Age-related osteoporosis without current pathological fracture: Secondary | ICD-10-CM | POA: Diagnosis not present

## 2021-11-13 DIAGNOSIS — Z01818 Encounter for other preprocedural examination: Secondary | ICD-10-CM

## 2021-11-13 DIAGNOSIS — Z0000 Encounter for general adult medical examination without abnormal findings: Secondary | ICD-10-CM

## 2021-11-13 DIAGNOSIS — I10 Essential (primary) hypertension: Secondary | ICD-10-CM | POA: Diagnosis not present

## 2021-11-13 DIAGNOSIS — M5136 Other intervertebral disc degeneration, lumbar region: Secondary | ICD-10-CM | POA: Diagnosis not present

## 2021-11-13 DIAGNOSIS — Z1231 Encounter for screening mammogram for malignant neoplasm of breast: Secondary | ICD-10-CM

## 2021-11-13 MED ORDER — TRAMADOL HCL 50 MG PO TABS: 50.0000 mg | ORAL_TABLET | Freq: Two times a day (BID) | ORAL | 2 refills | Status: DC | PRN

## 2021-11-13 NOTE — Telephone Encounter (Signed)
Patient aware labs are not back yet.

## 2021-11-13 NOTE — Patient Instructions (Signed)
You had labs performed today.  You will be contacted with the results of the labs once they are available, usually in the next 3 business days for routine lab work.  If you have an active my chart account, they will be released to your MyChart.  If you prefer to have these labs released to you via telephone, please let us know. ° Preventive Care 65 Years and Older, Female °Preventive care refers to lifestyle choices and visits with your health care provider that can promote health and wellness. Preventive care visits are also called wellness exams. °What can I expect for my preventive care visit? °Counseling °Your health care provider may ask you questions about your: °Medical history, including: °Past medical problems. °Family medical history. °Pregnancy and menstrual history. °History of falls. °Current health, including: °Memory and ability to understand (cognition). °Emotional well-being. °Home life and relationship well-being. °Sexual activity and sexual health. °Lifestyle, including: °Alcohol, nicotine or tobacco, and drug use. °Access to firearms. °Diet, exercise, and sleep habits. °Work and work environment. °Sunscreen use. °Safety issues such as seatbelt and bike helmet use. °Physical exam °Your health care provider will check your: °Height and weight. These may be used to calculate your BMI (body mass index). BMI is a measurement that tells if you are at a healthy weight. °Waist circumference. This measures the distance around your waistline. This measurement also tells if you are at a healthy weight and may help predict your risk of certain diseases, such as type 2 diabetes and high blood pressure. °Heart rate and blood pressure. °Body temperature. °Skin for abnormal spots. °What immunizations do I need? °Vaccines are usually given at various ages, according to a schedule. Your health care provider will recommend vaccines for you based on your age, medical history, and lifestyle or other factors, such as  travel or where you work. °What tests do I need? °Screening °Your health care provider may recommend screening tests for certain conditions. This may include: °Lipid and cholesterol levels. °Hepatitis C test. °Hepatitis B test. °HIV (human immunodeficiency virus) test. °STI (sexually transmitted infection) testing, if you are at risk. °Lung cancer screening. °Colorectal cancer screening. °Diabetes screening. This is done by checking your blood sugar (glucose) after you have not eaten for a while (fasting). °Mammogram. Talk with your health care provider about how often you should have regular mammograms. °BRCA-related cancer screening. This may be done if you have a family history of breast, ovarian, tubal, or peritoneal cancers. °Bone density scan. This is done to screen for osteoporosis. °Talk with your health care provider about your test results, treatment options, and if necessary, the need for more tests. °Follow these instructions at home: °Eating and drinking ° °Eat a diet that includes fresh fruits and vegetables, whole grains, lean protein, and low-fat dairy products. Limit your intake of foods with high amounts of sugar, saturated fats, and salt. °Take vitamin and mineral supplements as recommended by your health care provider. °Do not drink alcohol if your health care provider tells you not to drink. °If you drink alcohol: °Limit how much you have to 0-1 drink a day. °Know how much alcohol is in your drink. In the [place] one drink equals one 12 oz bottle of beer (355 mL), one 5 oz glass of wine (148 mL), or one 1½ oz glass of hard liquor (44 mL). °Lifestyle °Brush your teeth every morning and night with fluoride toothpaste. Floss one time each day. °Exercise for at least 30 minutes 5 or more days each   week. °Do not use any products that contain nicotine or tobacco. These products include cigarettes, chewing tobacco, and vaping devices, such as e-cigarettes. If you need help quitting, ask your health care  provider. °Do not use drugs. °If you are sexually active, practice safe sex. Use a condom or other form of protection in order to prevent STIs. °Take aspirin only as told by your health care provider. Make sure that you understand how much to take and what form to take. Work with your health care provider to find out whether it is safe and beneficial for you to take aspirin daily. °Ask your health care provider if you need to take a cholesterol-lowering medicine (statin). °Find healthy ways to manage stress, such as: °Meditation, yoga, or listening to music. °Journaling. °Talking to a trusted person. °Spending time with friends and family. °Minimize exposure to UV radiation to reduce your risk of skin cancer. °Safety °Always wear your seat belt while driving or riding in a vehicle. °Do not drive: °If you have been drinking alcohol. Do not ride with someone who has been drinking. °When you are tired or distracted. °While texting. °If you have been using any mind-altering substances or drugs. °Wear a helmet and other protective equipment during sports activities. °If you have firearms in your house, make sure you follow all gun safety procedures. °What's next? °Visit your health care provider once a year for an annual wellness visit. °Ask your health care provider how often you should have your eyes and teeth checked. °Stay up to date on all vaccines. °This information is not intended to replace advice given to you by your health care provider. Make sure you discuss any questions you have with your health care provider. °Document Revised: 02/13/2021 Document Reviewed: 02/13/2021 °Elsevier Patient Education © 2022 [employer] Inc[employer] ° ° °

## 2021-11-13 NOTE — Progress Notes (Signed)
Tiffany Bradley is a 76 y.o. female presents to office today for annual physical exam examination.    Concerns today include: Pt is a 76 y.o. female who is here for preoperative clearance for right knee arthroscopy vs replacement this summer  1) High Risk Cardiac Conditions  1) Recent MI - No.  2) Decompensated Heart Failure - No.  3) Unstable angina - No.  4) Symptomatic arrythmia - No.  5) Sx Valvular Disease - No.  2) Intermediate Risk Factors - DM, CKD, CVA, CHF, CAD - No.  2) Functional Status - > 4 mets (Walk, run, climb stairs) Yes.  Marland Kitchen    3) Surgery Specific Risk - Intermediate   4) Further Noninvasive evaluation -   1) EKG - Yes.    2) Echo - No.   1) Worsening dyspnea   3) Stress Testing - Active Cardiac Disease - No.  5) Need for medical therapy - Beta Blocker, Statins indicated ? No.  Occupation: retired  Diet: balanced, Exercise: limited by knee pain Last colonoscopy: UTD Last mammogram: needs Last pap smear: n/a Refills needed today: Tramadol Immunizations needed: Immunization History  Administered Date(s) Administered   Moderna Sars-Covid-2 Vaccination 10/06/2019, 11/04/2019, 06/23/2020, 12/13/2020     Past Medical History:  Diagnosis Date   Arthritis    Back pain    Cataract    DJD (degenerative joint disease)    Full dentures    GERD (gastroesophageal reflux disease)    Glaucoma    History of bleeding ulcers    patient reports multiple hospitalizations remotely   Hypertension    Wears glasses    Social History   Socioeconomic History   Marital status: Divorced    Spouse name: Not on file   Number of children: 3   Years of education: 11   Highest education level: Not on file  Occupational History   Occupation: Retired    Associate Professor: [employer]  Tobacco Use   Smoking status: Former    Packs/day: 3.00    Types: Cigarettes    Quit date: 04/20/1993    Years since quitting: 28.5   Smokeless tobacco: Never  Vaping Use   Vaping Use:  Never used  Substance and Sexual Activity   Alcohol use: Not Currently   Drug use: No   Sexual activity: Not Currently  Other Topics Concern   Not on file  Social History Narrative   Tiffany Bradley is retired and  her 13 year old granddaughter lives with her. She has two daughters and one son, all local that she sees regularly. She is active in church and sings in the choir. She enjoys crafting, yard work, and shopping.    Social Determinants of Health   Financial Resource Strain: Low Risk    Difficulty of Paying Living Expenses: Not hard at all  Food Insecurity: No Food Insecurity   Worried About Programme researcher, broadcasting/film/video in the Last Year: Never true   Ran Out of Food in the Last Year: Never true  Transportation Needs: No Transportation Needs   Lack of Transportation (Medical): No   Lack of Transportation (Non-Medical): No  Physical Activity: Sufficiently Active   Days of Exercise per Week: 7 days   Minutes of Exercise per Session: 30 min  Stress: No Stress Concern Present   Feeling of Stress : Not at all  Social Connections: Moderately Integrated   Frequency of Communication with Friends and Family: More than three times a week   Frequency of Social  Gatherings with Friends and Family: More than three times a week   Attends Religious Services: More than 4 times per year   Active Member of Clubs or Organizations: Yes   Attends Engineer, structural: More than 4 times per year   Marital Status: Divorced  Catering manager Violence: Not At Risk   Fear of Current or Ex-Partner: No   Emotionally Abused: No   Physically Abused: No   Sexually Abused: No   Past Surgical History:  Procedure Laterality Date   BREAST REDUCTION SURGERY Bilateral 04/25/2013   Procedure: MAMMARY REDUCTION  (BREAST);  Surgeon: Louisa Second, MD;  Location: [facility];  Service: Plastics;  Laterality: Bilateral;   COLONOSCOPY     2008, normal per patient   COLONOSCOPY N/A 01/16/2017   Procedure:  COLONOSCOPY;  Surgeon: West Bali, MD;  Location: [facility];  Service: Endoscopy;  Laterality: N/A;  830    COSMETIC SURGERY     ESOPHAGOGASTRODUODENOSCOPY N/A 01/16/2017   Procedure: ESOPHAGOGASTRODUODENOSCOPY (EGD);  Surgeon: West Bali, MD;  Location: [facility];  Service: Endoscopy;  Laterality: N/A;   PARTIAL COLECTOMY N/A 06/29/2016   Procedure: PARTIAL COLECTOMY;  Surgeon: Ancil Linsey, MD;  Location: [facility];  Service: General;  Laterality: N/A;   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   Procedure: TOTAL KNEE ARTHROPLASTY;  Surgeon: Vickki Hearing, MD;  Location: [facility];  Service: Orthopedics;  Laterality: Left;  DePuy    TUBAL LIGATION     Family History  Problem Relation Age of Onset   Kidney disease Mother    Stroke Mother    Heart attack Father    Hyperlipidemia Sister    Hypertension Sister    Diabetes Sister    Hypertension Brother    Hyperlipidemia Brother    Hypertension Sister    Hyperlipidemia Sister    Colon cancer Neg Hx     Current Outpatient Medications:    amLODipine (NORVASC) 10 MG tablet, Take 1 tablet (10 mg total) by mouth daily., Disp: 90 tablet, Rfl: 3   aspirin EC 325 MG EC tablet, Take 1 tablet (325 mg total) by mouth daily with breakfast., Disp: 30 tablet, Rfl: 0   atorvastatin (LIPITOR) 20 MG tablet, Take 1 tablet (20 mg total) by mouth daily., Disp: 90 tablet, Rfl: 3   Calcium Carb-Cholecalciferol (CALCIUM 600+D3 PO), Take 1 tablet by mouth daily., Disp: , Rfl:    denosumab (PROLIA) 60 MG/ML SOSY injection, Bring to your doctor to inject 60mg  subcutaneously once. (Patient not taking: Reported on 09/30/2021), Disp: 1 mL, Rfl: 0   dextromethorphan-guaiFENesin (MUCINEX DM) 30-600 MG 12hr tablet, Take 1 tablet by mouth 2 (two) times daily., Disp: 30 tablet, Rfl: 0   diclofenac (VOLTAREN) 75 MG EC tablet, TAKE 1 TABLET BY MOUTH TWICE DAILY AS NEEDED FOR MODERATE PAIN, Disp: 180 tablet, Rfl: 1   ferrous sulfate 325 (65 FE) MG  tablet, Take 325 mg by mouth daily., Disp: , Rfl:    gabapentin (NEURONTIN) 300 MG capsule, Take 1 capsule (300 mg total) by mouth 2 (two) times daily., Disp: 180 capsule, Rfl: 3   Glucosamine HCl-MSM (GLUCOSAMINE-MSM PO), Take 2 tablets by mouth daily., Disp: , Rfl:    mometasone (NASONEX) 50 MCG/ACT nasal spray, Place 2 sprays into the nose daily., Disp: 1 each, Rfl: 12   Multiple Vitamins-Minerals (MULTI-VITAMIN GUMMIES PO), Take 2 each by mouth daily. , Disp: , Rfl:    omeprazole (PRILOSEC) 40 MG capsule, Take 1  capsule (40 mg total) by mouth daily., Disp: 90 capsule, Rfl: 3   PEG 400 Monostearate POWD, Take 17 g by mouth daily., Disp: 454 g, Rfl: 5   sodium chloride (OCEAN) 0.65 % SOLN nasal spray, Place 1 spray into the nose as needed for congestion., Disp: 60 mL, Rfl: ML   sucralfate (CARAFATE) 1 g tablet, TAKE 1 TABLET BY MOUTH 4 TIMES DAILY WITH MEALS AND AT BEDTIME, Disp: 120 tablet, Rfl: 2   tiZANidine (ZANAFLEX) 4 MG tablet, TAKE 1/2 TO 1 (ONE-HALF TO ONE) TABLET BY MOUTH EVERY 8 HOURS AS NEEDED FOR MUSCLE SPASM. USE SPARINGLY., Disp: 60 tablet, Rfl: 0   tolterodine (DETROL LA) 2 MG 24 hr capsule, Take 1 capsule (2 mg total) by mouth daily., Disp: 90 capsule, Rfl: 3   traMADol (ULTRAM) 50 MG tablet, Take 1 tablet (50 mg total) by mouth every 12 (twelve) hours as needed for moderate pain or severe pain. Put on file, Disp: 60 tablet, Rfl: 2  No Known Allergies   ROS: Review of Systems A comprehensive review of systems was negative except for: Eyes: positive for contacts/glasses Genitourinary: positive for frequency and urinary incontinence Musculoskeletal: positive for arthralgias    Physical exam BP 117/73   Pulse 88   Temp 97.9 F (36.6 C) (Temporal)   Ht 5\' 7"  (1.702 m)   Wt 172 lb 2 oz (78.1 kg)   BMI 26.96 kg/m  General appearance: alert, cooperative, appears stated age, and no distress Head: Normocephalic, without obvious abnormality, atraumatic Eyes: negative  findings: lids and lashes normal, conjunctivae and sclerae normal, corneas clear, and pupils equal, round, reactive to light and accomodation Ears: normal TM's and external ear canals both ears Nose: Nares normal. Septum midline. Mucosa normal. No drainage or sinus tenderness. Throat:  False teeth.  Oropharynx without mass.  Mallampati 1 Neck: no adenopathy, no carotid bruit, supple, symmetrical, trachea midline, and thyroid not enlarged, symmetric, no tenderness/mass/nodules Back: symmetric, no curvature. ROM normal. No CVA tenderness. Lungs: clear to auscultation bilaterally Heart:  Soft systolic murmur at the right sternal border.  S1-S2 heard.  Regular rate and rhythm Abdomen: soft, non-tender; bowel sounds normal; no masses,  no organomegaly Extremities: extremities normal, atraumatic, no cyanosis or edema Pulses: 2+ and symmetric Skin: Skin color, texture, turgor normal. No rashes or lesions Lymph nodes: Cervical, supraclavicular, and axillary nodes normal. Neurologic: Grossly normal  MSK: Arthritic changes to bilateral knees present right greater than left.  Ambulating independently but gait is somewhat stiff  Assessment/ Plan: Tiffany Bradley here for annual physical exam.   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  She will schedule her  mammogram.  Fasting labs ordered.  EKG ordered for preop exam.  I personally viewed this EKG and it was normal.  We discussed options for osteoporosis and we will plan for Fosamax pending normal kidney function, calcium level and vitamin D level.  Controlled substance contract was updated per office policy today, UDS collected.  Tramadol renewed.  National narcotic database reviewed and there were no red flags.  Anticipate right knee surgery this summer  I have independently evaluated patient.  WENDYE OUTHOUSE is a 76 y.o. female who is low risk for an intermediate risk surgery.  There are not modifiable risk factors (smoking, etc). Tiffany Bradley's RCRI calculation for MACE is: 0.     Tiffany Mangieri M. Nadine Counts, DO

## 2021-11-14 LAB — CMP14+EGFR
ALT: 26 IU/L (ref 0–32)
AST: 26 IU/L (ref 0–40)
Albumin/Globulin Ratio: 1.9 (ref 1.2–2.2)
Albumin: 4.5 g/dL (ref 3.7–4.7)
Alkaline Phosphatase: 131 IU/L — ABNORMAL HIGH (ref 44–121)
BUN/Creatinine Ratio: 26 (ref 12–28)
BUN: 16 mg/dL (ref 8–27)
Bilirubin Total: 0.7 mg/dL (ref 0.0–1.2)
CO2: 27 mmol/L (ref 20–29)
Calcium: 9.5 mg/dL (ref 8.7–10.3)
Chloride: 105 mmol/L (ref 96–106)
Creatinine, Ser: 0.61 mg/dL (ref 0.57–1.00)
Globulin, Total: 2.4 g/dL (ref 1.5–4.5)
Glucose: 81 mg/dL (ref 70–99)
Potassium: 4.3 mmol/L (ref 3.5–5.2)
Sodium: 143 mmol/L (ref 134–144)
Total Protein: 6.9 g/dL (ref 6.0–8.5)
eGFR: 94 mL/min/{1.73_m2} (ref >59)

## 2021-11-14 LAB — CBC
Hematocrit: 33.7 % — ABNORMAL LOW (ref 34.0–46.6)
Hemoglobin: 10.7 g/dL — ABNORMAL LOW (ref 11.1–15.9)
MCH: 25.6 pg — ABNORMAL LOW (ref 26.6–33.0)
MCHC: 31.8 g/dL (ref 31.5–35.7)
MCV: 81 fL (ref 79–97)
Platelets: 305 10*3/uL (ref 150–450)
RBC: 4.18 x10E6/uL (ref 3.77–5.28)
RDW: 16.5 % — ABNORMAL HIGH (ref 11.7–15.4)
WBC: 3.9 10*3/uL (ref 3.4–10.8)

## 2021-11-14 LAB — LIPID PANEL
Chol/HDL Ratio: 2.1 ratio (ref 0.0–4.4)
Cholesterol, Total: 138 mg/dL (ref 100–199)
HDL: 65 mg/dL (ref >39)
LDL Chol Calc (NIH): 63 mg/dL (ref 0–99)
Triglycerides: 43 mg/dL (ref 0–149)
VLDL Cholesterol Cal: 10 mg/dL (ref 5–40)

## 2021-11-14 LAB — VITAMIN D 25 HYDROXY (VIT D DEFICIENCY, FRACTURES): Vit D, 25-Hydroxy: 83.2 ng/mL (ref 30.0–100.0)

## 2021-11-14 LAB — TSH: TSH: 1.93 u[IU]/mL (ref 0.450–4.500)

## 2021-11-18 ENCOUNTER — Other Ambulatory Visit: Payer: Self-pay | Admitting: *Deleted

## 2021-11-18 DIAGNOSIS — R710 Precipitous drop in hematocrit: Secondary | ICD-10-CM

## 2021-11-19 LAB — TOXASSURE SELECT 13 (MW), URINE

## 2021-12-02 ENCOUNTER — Ambulatory Visit: Payer: Medicare HMO | Admitting: Orthopedic Surgery

## 2021-12-02 ENCOUNTER — Ambulatory Visit: Payer: Medicare HMO

## 2021-12-02 VITALS — Ht 67.0 in | Wt 172.0 lb

## 2021-12-02 DIAGNOSIS — G8929 Other chronic pain: Secondary | ICD-10-CM | POA: Diagnosis not present

## 2021-12-02 DIAGNOSIS — Z96652 Presence of left artificial knee joint: Secondary | ICD-10-CM | POA: Diagnosis not present

## 2021-12-02 DIAGNOSIS — M4316 Spondylolisthesis, lumbar region: Secondary | ICD-10-CM

## 2021-12-02 DIAGNOSIS — M7062 Trochanteric bursitis, left hip: Secondary | ICD-10-CM | POA: Diagnosis not present

## 2021-12-02 DIAGNOSIS — M1612 Unilateral primary osteoarthritis, left hip: Secondary | ICD-10-CM | POA: Diagnosis not present

## 2021-12-02 DIAGNOSIS — M25561 Pain in right knee: Secondary | ICD-10-CM

## 2021-12-02 NOTE — Progress Notes (Signed)
FOLLOW UP  ? ?Encounter Diagnoses  ?Name Primary?  ? Chronic pain of right knee Yes  ? Trochanteric bursitis, left hip   ? 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 ? ? ?Chief Complaint  ?Patient presents with  ? left hip pain  ?  Patient states back for injection. Not a lot of relief from the last injection. She feels that the hip swells. Complaint of pain all of the way down her left leg, past the knee at times. ?  ? right knee pain  ?  Patient states back for injection. Not a lot of relief from the last injection.   ? ? ? ?Tiffany Bradley is coming in today c/o left hip pain radiating into the left knee and constant back pain as well as right knee pain  ? ?She has been seen by neurosurgery for the spondylolisthesis and was given an injection as well as some medication but they felt her hip should be fixed first ? ? ?Current Meds diclofenac 75 mg twice daily, 50 mg tramadol every 12 hours as needed, tizanidine 4 mg every 8 hours as needed ? ? ?She has received greater trochanteric bursa injections which initially controlled her hip pain but now the pain is worse and is more in the groin ? ?As far as her right knee goes the last injection she received did not help her there as well ? ?It seems like her hip is hurting her the most today ? ?We are going to get an x-ray of the hip last x-ray showed arthritis and mottling of the femoral head ? ?Exam she has decreased internal rotation of the hip with pain some discomfort with flexion of the left hip mild tenderness in her lower back and no pain tenderness numbness or tingling below the knee ? ?X-ray ? 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 ?My plan would be to have her go ahead and get her hip done ? ?There may be some impediments related to her spine but neurosurgery can chime in on that if necessary ? ?I could then come back and do her knee  arthroplasty. ? ?Referral to Dr. Magnus Ivan for surgery consideration DAA left total hip ?

## 2021-12-02 NOTE — Patient Instructions (Addendum)
You've been referred to see Dr. Magnus Ivan at [facility] to discuss a hip replacement.  ?If you have not heard from them by Wednesday or Thursday, call them to set your appt up. ? ?[phone] ? ?

## 2021-12-07 ENCOUNTER — Other Ambulatory Visit: Payer: Self-pay | Admitting: Family Medicine

## 2021-12-12 ENCOUNTER — Ambulatory Visit (INDEPENDENT_AMBULATORY_CARE_PROVIDER_SITE_OTHER): Payer: Medicare HMO | Admitting: Orthopaedic Surgery

## 2021-12-12 VITALS — Ht 67.0 in | Wt 172.0 lb

## 2021-12-12 DIAGNOSIS — M25552 Pain in left hip: Secondary | ICD-10-CM | POA: Diagnosis not present

## 2021-12-12 DIAGNOSIS — M1612 Unilateral primary osteoarthritis, left hip: Secondary | ICD-10-CM

## 2021-12-12 HISTORY — DX: Unilateral primary osteoarthritis, left hip: M16.12

## 2021-12-12 NOTE — Progress Notes (Signed)
? ?Office Visit Note ?  ?[name]           ?Date of Birth: 1946/07/10           ?MRN: [id number] ?Visit Date: 12/12/2021 ?             ?Requested by: Vickki Hearing, MD ?[address] ?[place],  [place] [address] ?PCP: Raliegh Ip, DO ? ? ?Assessment & Plan: ?Visit Diagnoses:  ?1. Pain in left hip   ?2. Unilateral primary osteoarthritis, left hip   ? ? ?Plan: At this point I agree with the need for hip replacement given her clinical exam and x-ray findings.  I talked her about left hip surgery and anterior hip surgery.  I gave her handout about this type of surgery and showed her hip replacement model.  We discussed the risk and benefits of surgery and talked about what to expect from an intraoperative and postoperative course.  We will work on getting this scheduled in the near future.  All questions and concerns were answered and addressed. ? ?Follow-Up Instructions: Return for 2 weeks post-op.  ? ?Orders:  ?No orders of the defined types were placed in this encounter. ? ?No orders of the defined types were placed in this encounter. ? ? ? ? Procedures: ?No procedures performed ? ? ?Clinical Data: ?No additional findings. ? ? ?Subjective: ?Chief Complaint  ?Patient presents with  ? Left Hip - Pain  ?The patient is a very pleasant 76 year old female sent from Dr. Fuller Canada with [facility] to evaluate and treat significant arthritis of the left hip and consider her for left hip replacement.  Her left hip pain is daily and is detrimentally affecting her mobility, her quality of life and actives daily living.  She has had a successful right knee replacement by Dr. Romeo Apple and eventually wants him to replace her left knee.  However her left hip has become more the focus of her pain and the arthritis is significant and her pain is 10 out of 10 at times with the right hip and groin area.  She has a harder time putting her shoes and socks on that side and crossing her legs.  She  is otherwise an active individual and is not on blood thinning medications and is not a diabetic. ? ?HPI ? ?Review of Systems ?She currently denies any headache, chest pain, shortness of breath, fever, chills, nausea, vomiting ? ?Objective: ?Vital Signs: Ht 5\' 7"  (1.702 m)   Wt 172 lb (78 kg)   BMI 26.94 kg/m?  ? ?Physical Exam ?She is alert and orient x3 and in no acute distress ?Ortho Exam ?Examination of the right hip is entirely normal with full and fluid range of motion and no pain in the groin.  Her left hip has severe pain with internal and external rotation as well as significant stiffness where I cannot fully rotate her hip.  She also has pain with hip flexion. ?Specialty Comments:  ?No specialty comments available. ? ?Imaging: ?No results found. ?X-rays independently reviewed of her pelvis and left hip on the canopy system shows significant arthritis of the left hip.  There are cystic changes in the femoral head and significant joint space narrowing.  The femoral head is not congruent and there are osteophytes.  Her right hip appears normal. ? ?PMFS History: ?Patient Active Problem List  ? Diagnosis Date Noted  ? Unilateral primary osteoarthritis, left hip 12/12/2021  ? Viral upper respiratory tract  infection 03/05/2021  ? History of retinal detachment 03/12/2020  ? Pure hypercholesterolemia 03/12/2020  ? PUD (peptic ulcer disease) 05/09/2019  ? OAB (overactive bladder) 05/09/2019  ? Lumbar back pain 05/09/2019  ? DDD (degenerative disc disease), lumbar 05/09/2019  ? Status post total knee replacement, 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 02/11/2018  ? Iron deficiency anemia 04/28/2017  ? Vitamin D deficiency 04/28/2017  ? Gastritis, erosive   ? Abdominal pain, epigastric 12/12/2016  ? Colon cancer screening 11/03/2016  ? Essential hypertension 11/03/2016  ? Primary osteoarthritis of both knees 11/03/2016  ? GERD (gastroesophageal reflux disease) 11/03/2016  ? Age-related osteoporosis without current pathological fracture  11/03/2016  ? Perforation of sigmoid colon (HCC) 06/29/2016  ? ?Past Medical History:  ?Diagnosis Date  ? Arthritis   ? Back pain   ? Cataract   ? DJD (degenerative joint disease)   ? Full dentures   ? GERD (gastroesophageal reflux disease)   ? Glaucoma   ? History of bleeding ulcers   ? patient reports multiple hospitalizations remotely  ? Hypertension   ? Wears glasses   ?  ?Family History  ?Problem Relation Age of Onset  ? Kidney disease Mother   ? Stroke Mother   ? Heart attack Father   ? Hyperlipidemia Sister   ? Hypertension Sister   ? Diabetes Sister   ? Hypertension Brother   ? Hyperlipidemia Brother   ? Hypertension Sister   ? Hyperlipidemia Sister   ? Colon cancer Neg Hx   ?  ?Past Surgical History:  ?Procedure Laterality Date  ? BREAST REDUCTION SURGERY Bilateral 04/25/2013  ? Procedure: MAMMARY REDUCTION  (BREAST);  Surgeon: Louisa Second, MD;  Location: [facility];  Service: Plastics;  Laterality: Bilateral;  ? COLONOSCOPY    ? 2008, normal per patient  ? COLONOSCOPY N/A 01/16/2017  ? Procedure: COLONOSCOPY;  Surgeon: West Bali, MD;  Location: [facility];  Service: Endoscopy;  Laterality: N/A;  830 ?  ? COSMETIC SURGERY    ? ESOPHAGOGASTRODUODENOSCOPY N/A 01/16/2017  ? Procedure: ESOPHAGOGASTRODUODENOSCOPY (EGD);  Surgeon: West Bali, MD;  Location: [facility];  Service: Endoscopy;  Laterality: N/A;  ? PARTIAL COLECTOMY N/A 06/29/2016  ? Procedure: PARTIAL COLECTOMY;  Surgeon: Ancil Linsey, MD;  Location: [facility];  Service: General;  Laterality: N/A;  ? 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 Procedure: TOTAL KNEE ARTHROPLASTY;  Surgeon: Vickki Hearing, MD;  Location: [facility];  Service: Orthopedics;  Laterality: Left;  [place]   ? TUBAL LIGATION    ? ?Social History  ? ?Occupational History  ? Occupation: Retired  ?  Employer: [employer]  ?Tobacco Use  ? Smoking status: Former  ?  Packs/day: 3.00  ?  Types: Cigarettes  ?  Quit date: 04/20/1993  ?  Years since  quitting: 28.6  ? Smokeless tobacco: Never  ?Vaping Use  ? Vaping Use: Never used  ?Substance and Sexual Activity  ? Alcohol use: Not Currently  ? Drug use: No  ? Sexual activity: Not Currently  ? ? ? ? ? ? ?

## 2021-12-13 ENCOUNTER — Inpatient Hospital Stay (HOSPITAL_COMMUNITY)
Admission: EM | Admit: 2021-12-13 | Discharge: 2021-12-16 | DRG: 390 | Disposition: A | Discharge status: Home / Self Care | Payer: Medicare HMO | Attending: Internal Medicine | Admitting: Internal Medicine

## 2021-12-13 ENCOUNTER — Other Ambulatory Visit: Payer: Self-pay

## 2021-12-13 ENCOUNTER — Ambulatory Visit: Payer: Medicare HMO | Admitting: Nurse Practitioner

## 2021-12-13 ENCOUNTER — Encounter (HOSPITAL_COMMUNITY): Payer: Self-pay | Admitting: Emergency Medicine

## 2021-12-13 ENCOUNTER — Emergency Department (HOSPITAL_COMMUNITY): Payer: Medicare HMO

## 2021-12-13 DIAGNOSIS — Z833 Family history of diabetes mellitus: Secondary | ICD-10-CM | POA: Diagnosis not present

## 2021-12-13 DIAGNOSIS — K219 Gastro-esophageal reflux disease without esophagitis: Secondary | ICD-10-CM | POA: Diagnosis not present

## 2021-12-13 DIAGNOSIS — I10 Essential (primary) hypertension: Secondary | ICD-10-CM | POA: Diagnosis not present

## 2021-12-13 DIAGNOSIS — Z7982 Long term (current) use of aspirin: Secondary | ICD-10-CM

## 2021-12-13 DIAGNOSIS — M1612 Unilateral primary osteoarthritis, left hip: Secondary | ICD-10-CM | POA: Diagnosis not present

## 2021-12-13 DIAGNOSIS — M170 Bilateral primary osteoarthritis of knee: Secondary | ICD-10-CM | POA: Diagnosis not present

## 2021-12-13 DIAGNOSIS — H409 Unspecified glaucoma: Secondary | ICD-10-CM | POA: Diagnosis present

## 2021-12-13 DIAGNOSIS — Z79899 Other long term (current) drug therapy: Secondary | ICD-10-CM | POA: Diagnosis not present

## 2021-12-13 DIAGNOSIS — R7303 Prediabetes: Secondary | ICD-10-CM | POA: Diagnosis present

## 2021-12-13 DIAGNOSIS — Z96652 Presence of left artificial knee joint: Secondary | ICD-10-CM | POA: Diagnosis present

## 2021-12-13 DIAGNOSIS — K6389 Other specified diseases of intestine: Secondary | ICD-10-CM | POA: Diagnosis not present

## 2021-12-13 DIAGNOSIS — Z87891 Personal history of nicotine dependence: Secondary | ICD-10-CM

## 2021-12-13 DIAGNOSIS — R109 Unspecified abdominal pain: Secondary | ICD-10-CM | POA: Diagnosis not present

## 2021-12-13 DIAGNOSIS — K5900 Constipation, unspecified: Secondary | ICD-10-CM | POA: Diagnosis not present

## 2021-12-13 DIAGNOSIS — R739 Hyperglycemia, unspecified: Secondary | ICD-10-CM | POA: Diagnosis not present

## 2021-12-13 DIAGNOSIS — M810 Age-related osteoporosis without current pathological fracture: Secondary | ICD-10-CM | POA: Diagnosis not present

## 2021-12-13 DIAGNOSIS — R1084 Generalized abdominal pain: Secondary | ICD-10-CM | POA: Diagnosis not present

## 2021-12-13 DIAGNOSIS — K56609 Unspecified intestinal obstruction, unspecified as to partial versus complete obstruction: Secondary | ICD-10-CM

## 2021-12-13 DIAGNOSIS — Z8249 Family history of ischemic heart disease and other diseases of the circulatory system: Secondary | ICD-10-CM | POA: Diagnosis not present

## 2021-12-13 DIAGNOSIS — N3281 Overactive bladder: Secondary | ICD-10-CM | POA: Diagnosis not present

## 2021-12-13 DIAGNOSIS — K5939 Other megacolon: Secondary | ICD-10-CM | POA: Diagnosis not present

## 2021-12-13 DIAGNOSIS — Z9049 Acquired absence of other specified parts of digestive tract: Secondary | ICD-10-CM | POA: Diagnosis not present

## 2021-12-13 DIAGNOSIS — Z4682 Encounter for fitting and adjustment of non-vascular catheter: Secondary | ICD-10-CM | POA: Diagnosis not present

## 2021-12-13 LAB — COMPREHENSIVE METABOLIC PANEL
ALT: 22 U/L (ref 0–44)
AST: 26 U/L (ref 15–41)
Albumin: 4.2 g/dL (ref 3.5–5.0)
Alkaline Phosphatase: 116 U/L (ref 38–126)
Anion gap: 8 (ref 5–15)
BUN: 14 mg/dL (ref 8–23)
CO2: 25 mmol/L (ref 22–32)
Calcium: 9.5 mg/dL (ref 8.9–10.3)
Chloride: 104 mmol/L (ref 98–111)
Creatinine, Ser: 0.48 mg/dL (ref 0.44–1.00)
GFR, Estimated: >60 mL/min (ref >60)
Glucose, Bld: 164 mg/dL — ABNORMAL HIGH (ref 70–99)
Potassium: 3.8 mmol/L (ref 3.5–5.1)
Sodium: 137 mmol/L (ref 135–145)
Total Bilirubin: 0.8 mg/dL (ref 0.3–1.2)
Total Protein: 7.9 g/dL (ref 6.5–8.1)

## 2021-12-13 LAB — CBC
HCT: 35.4 % — ABNORMAL LOW (ref 36.0–46.0)
Hemoglobin: 11.3 g/dL — ABNORMAL LOW (ref 12.0–15.0)
MCH: 25.4 pg — ABNORMAL LOW (ref 26.0–34.0)
MCHC: 31.9 g/dL (ref 30.0–36.0)
MCV: 79.6 fL — ABNORMAL LOW (ref 80.0–100.0)
Platelets: 326 10*3/uL (ref 150–400)
RBC: 4.45 MIL/uL (ref 3.87–5.11)
RDW: 16.7 % — ABNORMAL HIGH (ref 11.5–15.5)
WBC: 9.4 10*3/uL (ref 4.0–10.5)
nRBC: 0 % (ref 0.0–0.2)

## 2021-12-13 LAB — URINALYSIS, ROUTINE W REFLEX MICROSCOPIC
Bilirubin Urine: NEGATIVE
Glucose, UA: NEGATIVE mg/dL
Hgb urine dipstick: NEGATIVE
Ketones, ur: NEGATIVE mg/dL
Nitrite: NEGATIVE
Protein, ur: 100 mg/dL — AB
Specific Gravity, Urine: 1.02 (ref 1.005–1.030)
pH: 9 — ABNORMAL HIGH (ref 5.0–8.0)

## 2021-12-13 LAB — POC OCCULT BLOOD, ED: Fecal Occult Bld: NEGATIVE

## 2021-12-13 LAB — LIPASE, BLOOD: Lipase: 35 U/L (ref 11–51)

## 2021-12-13 MED ORDER — ONDANSETRON HCL 4 MG/2ML IJ SOLN
4.0000 mg | Freq: Four times a day (QID) | INTRAMUSCULAR | Status: DC | PRN
Start: 2021-12-13 — End: 2021-12-16
  Administered 2021-12-15: 4 mg via INTRAVENOUS
  Filled 2021-12-13: qty 2

## 2021-12-13 MED ORDER — SODIUM CHLORIDE 0.9 % IV SOLN: INTRAVENOUS | Status: DC

## 2021-12-13 MED ORDER — BISACODYL 10 MG RE SUPP: 10.0000 mg | Freq: Every day | RECTAL | Status: DC | PRN

## 2021-12-13 MED ORDER — FLUTICASONE PROPIONATE 50 MCG/ACT NA SUSP
1.0000 | Freq: Every day | NASAL | Status: DC
Start: 2021-12-13 — End: 2021-12-16
  Administered 2021-12-13 – 2021-12-16 (×3): 1 via NASAL
  Filled 2021-12-13: qty 16

## 2021-12-13 MED ORDER — ONDANSETRON HCL 4 MG/2ML IJ SOLN
4.0000 mg | Freq: Once | INTRAMUSCULAR | Status: AC
  Administered 2021-12-13: 4 mg via INTRAVENOUS
  Filled 2021-12-13: qty 2

## 2021-12-13 MED ORDER — SODIUM CHLORIDE 0.9 % IV SOLN: 250.0000 mL | INTRAVENOUS | Status: DC | PRN

## 2021-12-13 MED ORDER — SODIUM CHLORIDE 0.9% FLUSH: 3.0000 mL | INTRAVENOUS | Status: DC | PRN

## 2021-12-13 MED ORDER — ENOXAPARIN SODIUM 40 MG/0.4ML IJ SOSY
40.0000 mg | PREFILLED_SYRINGE | INTRAMUSCULAR | Status: DC
  Administered 2021-12-13 – 2021-12-15 (×3): 40 mg via SUBCUTANEOUS
  Filled 2021-12-13 (×4): qty 0.4

## 2021-12-13 MED ORDER — METOCLOPRAMIDE HCL 5 MG/ML IJ SOLN
5.0000 mg | Freq: Four times a day (QID) | INTRAMUSCULAR | Status: AC
  Administered 2021-12-13 – 2021-12-15 (×6): 5 mg via INTRAVENOUS
  Filled 2021-12-13 (×6): qty 2

## 2021-12-13 MED ORDER — HYDROMORPHONE HCL 1 MG/ML IJ SOLN
0.5000 mg | Freq: Once | INTRAMUSCULAR | Status: AC
  Administered 2021-12-13: 0.5 mg via INTRAVENOUS
  Filled 2021-12-13: qty 1

## 2021-12-13 MED ORDER — METOPROLOL TARTRATE 5 MG/5ML IV SOLN: 5.0000 mg | Freq: Four times a day (QID) | INTRAVENOUS | Status: DC | PRN

## 2021-12-13 MED ORDER — ONDANSETRON HCL 4 MG PO TABS: 4.0000 mg | ORAL_TABLET | Freq: Four times a day (QID) | ORAL | Status: DC | PRN

## 2021-12-13 MED ORDER — LORAZEPAM 2 MG/ML IJ SOLN: 0.5000 mg | Freq: Four times a day (QID) | INTRAMUSCULAR | Status: DC | PRN

## 2021-12-13 MED ORDER — PANTOPRAZOLE SODIUM 40 MG IV SOLR
40.0000 mg | Freq: Every day | INTRAVENOUS | Status: DC
  Administered 2021-12-13 – 2021-12-15 (×3): 40 mg via INTRAVENOUS
  Filled 2021-12-13 (×3): qty 10

## 2021-12-13 MED ORDER — MORPHINE SULFATE (PF) 4 MG/ML IV SOLN
4.0000 mg | INTRAVENOUS | Status: DC | PRN
  Administered 2021-12-14: 4 mg via INTRAVENOUS
  Filled 2021-12-13: qty 1

## 2021-12-13 MED ORDER — MORPHINE SULFATE (PF) 2 MG/ML IV SOLN
2.0000 mg | INTRAVENOUS | Status: DC | PRN
  Administered 2021-12-13 – 2021-12-14 (×4): 2 mg via INTRAVENOUS
  Filled 2021-12-13 (×4): qty 1

## 2021-12-13 MED ORDER — POTASSIUM CHLORIDE IN NACL 20-0.9 MEQ/L-% IV SOLN: INTRAVENOUS | Status: AC

## 2021-12-13 MED ORDER — IOHEXOL 300 MG/ML  SOLN
100.0000 mL | Freq: Once | INTRAMUSCULAR | Status: AC | PRN
  Administered 2021-12-13: 100 mL via INTRAVENOUS

## 2021-12-13 MED ORDER — SODIUM CHLORIDE 0.9% FLUSH
3.0000 mL | Freq: Two times a day (BID) | INTRAVENOUS | Status: DC
  Administered 2021-12-13 – 2021-12-15 (×4): 3 mL via INTRAVENOUS

## 2021-12-13 NOTE — ED Provider Notes (Addendum)
3:20 PM-checkout from Dr. Deretha Emory to evaluate patient after CT images are done.  Patient presented for evaluation of abdominal pain for several days with nausea and vomiting.  Dr. Deretha Emory was concerned for possible bowel obstruction.  She is vomiting and some symptoms of constipation as well.  Rectal exam done earlier did not indicate impaction. ? ?3:45 PM-CT results consistent with high-grade small bowel obstruction with a transition point indicating possible internal hernia.  She has had a prior partial colectomy, apparently after an episode of perforated sigmoid diverticulitis.  Case discussed with general surgeon Dr. Lovell Sheehan.  He will evaluate patient tomorrow.  He agrees with NG tube placement and admission to hospitalist service.  This time the patient is comfortable.  She has not vomited since 5 AM.  The abdomen is soft but mildly tender to palpation. ? ?3:56 PM-Consult complete with hospitalist. Patient case explained and discussed. Requirement for admission agreed on. Possible Risk of worsening condition.  She agrees to admit patient for further evaluation and treatment. Call ended at 4:13 PM ?  ? ?  ?Mancel Bale, MD ?12/13/21 1638 ? ?

## 2021-12-13 NOTE — ED Provider Notes (Signed)
?[facility] ?Provider Note ? ? ?CSN: [id number] ?Arrival date & time: 12/13/21  1212 ? ?  ? ?History ? ?Chief Complaint  ?Patient presents with  ? Abdominal Pain  ? ? ?Tiffany Bradley is a 76 y.o. female. ? ?Patient here with a complaint of crampy abdominal pain that started 3 days ago.  Bit worse today.  Started associated with nausea and vomiting but no diarrhea.  Patient has not had a bowel movement for a few days she thought it was constipation.  She took some MiraLAX on Monday.  But she felt bloated at that time but was not having the pain.  Patient's had prior abdominal surgery and has had part of her colon removed.  That partial colectomy was done in 2017.  Patient not clear why that was done.  Past medical history otherwise arthritis hypertension gastroesophageal reflux disease history of bleeding ulcers.  Patient had colonoscopy in 2018 as a follow-up.  Patient denies any vomiting of blood. ? ? ?  ? ?Home Medications ?Prior to Admission medications   ?Medication Sig Start Date End Date Taking? Authorizing Provider  ?amLODipine (NORVASC) 10 MG tablet Take 1 tablet (10 mg total) by mouth daily. 05/15/21  Yes Raliegh IpGottschalk, Ashly M, DO  ?aspirin EC 325 MG EC tablet Take 1 tablet (325 mg total) by mouth daily with breakfast. 02/13/18  Yes Vickki HearingHarrison, Stanley E, MD  ?atorvastatin (LIPITOR) 20 MG tablet Take 1 tablet (20 mg total) by mouth daily. 05/15/21  Yes Raliegh IpGottschalk, Ashly M, DO  ?Calcium Carb-Cholecalciferol (CALCIUM 600+D3 PO) Take 1 tablet by mouth daily.   Yes [provider]  ?dextromethorphan-guaiFENesin (MUCINEX DM) 30-600 MG 12hr tablet Take 1 tablet by mouth 2 (two) times daily. 03/05/21  Yes Daryll DrownIjaola, Onyeje M, NP  ?diclofenac (VOLTAREN) 75 MG EC tablet TAKE 1 TABLET BY MOUTH TWICE DAILY AS NEEDED FOR MODERATE PAIN ?Patient taking differently: Take 75 mg by mouth 2 (two) times daily as needed for moderate pain. 12/09/21  Yes Delynn FlavinGottschalk, Ashly M, DO  ?ferrous sulfate 325 (65 FE) MG  tablet Take 325 mg by mouth daily.   Yes [provider]  ?gabapentin (NEURONTIN) 300 MG capsule Take 1 capsule (300 mg total) by mouth 2 (two) times daily. 05/15/21  Yes Raliegh IpGottschalk, Ashly M, DO  ?Glucosamine HCl-MSM (GLUCOSAMINE-MSM PO) Take 2 tablets by mouth daily.   Yes [provider]  ?mometasone (NASONEX) 50 MCG/ACT nasal spray Place 2 sprays into the nose daily. 03/05/21  Yes Daryll DrownIjaola, Onyeje M, NP  ?Multiple Vitamins-Minerals (MULTI-VITAMIN GUMMIES PO) Take 2 each by mouth daily.    Yes [provider]  ?omeprazole (PRILOSEC) 40 MG capsule Take 1 capsule (40 mg total) by mouth daily. 05/15/21  Yes Delynn FlavinGottschalk, Ashly M, DO  ?PEG 400 Monostearate POWD Take 17 g by mouth daily. 05/04/19  Yes Remus LofflerJones, Angel S, PA-C  ?sucralfate (CARAFATE) 1 g tablet TAKE 1 TABLET BY MOUTH 4 TIMES DAILY WITH MEALS AND AT BEDTIME ?Patient taking differently: Take 1 g by mouth See admin instructions. TAKE 1 TABLET BY MOUTH 4 TIMES DAILY WITH MEALS AND AT BEDTIME 08/12/21  Yes Gottschalk, Ashly M, DO  ?tiZANidine (ZANAFLEX) 4 MG tablet TAKE 1/2 TO 1 (ONE-HALF TO ONE) TABLET BY MOUTH EVERY 8 HOURS AS NEEDED FOR MUSCLE SPASM. USE SPARINGLY. ?Patient taking differently: Take 4 mg by mouth at bedtime. 09/24/21  Yes Delynn FlavinGottschalk, Ashly M, DO  ?tolterodine (DETROL LA) 2 MG 24 hr capsule Take 1 capsule (2 mg total) by mouth  daily. 05/15/21  Yes Delynn Flavin M, DO  ?traMADol (ULTRAM) 50 MG tablet Take 1 tablet (50 mg total) by mouth every 12 (twelve) hours as needed for moderate pain or severe pain. 11/13/21  Yes Delynn Flavin M, DO  ?sodium chloride (OCEAN) 0.65 % SOLN nasal spray Place 1 spray into the nose as needed for congestion. ?Patient not taking: Reported on 12/13/2021 03/05/21   Daryll Drown, NP  ?   ? ?Allergies    ?Patient has no known allergies.   ? ?Review of Systems   ?Review of Systems  ?Constitutional:  Negative for chills and fever.  ?HENT:  Negative for ear pain and sore throat.   ?Eyes:  Negative  for pain and visual disturbance.  ?Respiratory:  Negative for cough and shortness of breath.   ?Cardiovascular:  Negative for chest pain and palpitations.  ?Gastrointestinal:  Positive for abdominal pain, nausea and vomiting. Negative for blood in stool.  ?Genitourinary:  Negative for dysuria and hematuria.  ?Musculoskeletal:  Negative for arthralgias and back pain.  ?Skin:  Negative for color change and rash.  ?Neurological:  Negative for seizures and syncope.  ?All other systems reviewed and are negative. ? ?Physical Exam ?Updated Vital Signs ?BP 123/90   Pulse 91   Temp 98.5 ?F (36.9 ?C)   Resp 17   Ht 1.702 m (5\' 7" )   Wt 78 kg   SpO2 100%   BMI 26.94 kg/m?  ?Physical Exam ?Vitals and nursing note reviewed.  ?Constitutional:   ?   General: She is not in acute distress. ?   Appearance: She is well-developed.  ?HENT:  ?   Head: Normocephalic and atraumatic.  ?Eyes:  ?   Conjunctiva/sclera: Conjunctivae normal.  ?Cardiovascular:  ?   Rate and Rhythm: Normal rate and regular rhythm.  ?   Heart sounds: No murmur heard. ?Pulmonary:  ?   Effort: Pulmonary effort is normal. No respiratory distress.  ?   Breath sounds: Normal breath sounds.  ?Abdominal:  ?   General: There is distension.  ?   Palpations: Abdomen is soft.  ?   Tenderness: There is generalized abdominal tenderness. There is no guarding or rebound.  ?   Hernia: No hernia is present.  ?Genitourinary: ?   Rectum: Normal. Guaiac result negative. No mass.  ?   Comments: Rectal vault without any significant amount of stool.  There was a little bit of light brown stool no gross blood Hemoccult negative.  Certainly no evidence of any significant rectal impaction ?Musculoskeletal:     ?   General: No swelling.  ?   Cervical back: Neck supple.  ?Skin: ?   General: Skin is warm and dry.  ?   Capillary Refill: Capillary refill takes less than 2 seconds.  ?Neurological:  ?   Mental Status: She is alert.  ?Psychiatric:     ?   Mood and Affect: Mood normal.   ? ? ?ED Results / Procedures / Treatments   ?Labs ?(all labs ordered are listed, but only abnormal results are displayed) ?Labs Reviewed  ?COMPREHENSIVE METABOLIC PANEL - Abnormal; Notable for the following components:  ?    Result Value  ? Glucose, Bld 164 (*)   ? All other components within normal limits  ?CBC - Abnormal; Notable for the following components:  ? Hemoglobin 11.3 (*)   ? HCT 35.4 (*)   ? MCV 79.6 (*)   ? MCH 25.4 (*)   ? RDW 16.7 (*)   ?  All other components within normal limits  ?LIPASE, BLOOD  ?URINALYSIS, ROUTINE W REFLEX MICROSCOPIC  ?POC OCCULT BLOOD, ED  ? ? ?EKG ?None ? ?Radiology ?No results found. ? ?Procedures ?Procedures  ? ? ?Medications Ordered in ED ?Medications  ?0.9 %  sodium chloride infusion ( Intravenous New Bag/Given 12/13/21 1422)  ?iohexol (OMNIPAQUE) 300 MG/ML solution 100 mL (has no administration in time range)  ?ondansetron (ZOFRAN) injection 4 mg (4 mg Intravenous Given 12/13/21 1421)  ?HYDROmorphone (DILAUDID) injection 0.5 mg (0.5 mg Intravenous Given 12/13/21 1421)  ? ? ?ED Course/ Medical Decision Making/ A&P ?  ?                        ?Medical Decision Making ?Amount and/or Complexity of Data Reviewed ?Labs: ordered. ?Radiology: ordered. ? ?Risk ?Prescription drug management. ? ?Patient symptoms a little concerning for possible obstruction.  Could be constipation but certainly no rectal stool impaction.  And also Hemoccult negative. ? ?CT scan abdomen and pelvis will be important.  Lipase normal complete metabolic panel blood sugar at 390 otherwise liver function test are normal.  CBC normal hemoglobin 11.3. ? ?Patient feels better with the pain medication and antinausea medicine.  CT scan abdomen pelvis to rule out obstruction. ? ?If just constipation patient is on tramadol on a regular basis.  I would recommend that the patient's start using MiraLAX on a regular basis may be 4 times for the first day then a couple times for the next several days. ? ?Final Clinical  Impression(s) / ED Diagnoses ?Final diagnoses:  ?Generalized abdominal pain  ? ? ?Rx / DC Orders ?ED Discharge Orders   ? ? None  ? ?  ? ? ?  ?Vanetta Mulders, MD ?12/13/21 1503 ? ?

## 2021-12-13 NOTE — ED Triage Notes (Signed)
Pt to the ED with complaints of constipation with her last normal BM 3 days ago. ? ?

## 2021-12-13 NOTE — Progress Notes (Signed)
Patient alert, oriented. Assessment completed. Patient connected to LIS, clear drainage. Patient reported old l knee replacement and having bil hip and r knee replacement in the works. Walks with a cane at home sometimes, but ambulated to the restroom here with minimal assist. Call bell within reach, bed in lowest position. ?

## 2021-12-13 NOTE — H&P (Signed)
?History and Physical  ? ? ?Tiffany Bradley UEA:[id number] DOB: 11/20/1945 DOA: 12/13/2021 ? ?PCP: Raliegh IpGottschalk, Ashly M, DO  ?Patient coming from: Home ? ?I have personally briefly reviewed [facility] ? ?Chief Complaint: Abdominal pain for 3 days ? ?HPI: Tiffany Bradley is a [age] y.o. female with medical history significant of Essential hypertension, primary osteoarthritis of both knees, gastroesophageal reflux disease, age-related osteoporosis, overactive bladder who presents to the emergency department with complaints of abdominal pain for 3 days.  She has had some vomiting and nausea but no diarrhea.  She has not had a bowel movement in a few days and thought she was constipated.  She took some MiraLAX on Monday but felt bloated at that time and was not having the pain.  Last night she continued to vomit until about 5 AM but has not eaten anything in the past 2 to 3 days.  Patient had a prior abdominal surgery with a partial colectomy done in 2017.  Unsure as to why it was performed.  Patient had a follow-up colonoscopy in 2018.  She has had no episodes of hematemesis. ? ?ED Course: Patient fully evaluated in the emergency department: Labs significant for glucose of 164, movement of 11.3 which is improved from 10.7 a month ago.  Urinalysis is unremarkable.  Fecal occult blood is negative.  CT scan shows a high-grade small bowel obstruction with a transition point in the right lower abdomen and a swirling appearance of the small bowel concerning for internal hernia.  Case was discussed with general surgery who recommended to medicine service, NG tube and will follow-up with patient. ? ?Review of Systems: As per HPI otherwise all other systems reviewed and  negative.  ? ?Past Medical History:  ?Diagnosis Date  ? Arthritis   ? Back pain   ? Cataract   ? DJD (degenerative joint disease)   ? Full dentures   ? GERD (gastroesophageal reflux disease)   ? Glaucoma   ? History of bleeding ulcers    ? patient reports multiple hospitalizations remotely  ? Hypertension   ? Wears glasses   ? ? ?Past Surgical History:  ?Procedure Laterality Date  ? BREAST REDUCTION SURGERY Bilateral 04/25/2013  ? Procedure: MAMMARY REDUCTION  (BREAST);  Surgeon: Louisa SecondGerald Truesdale, MD;  Location: [facility];  Service: Plastics;  Laterality: Bilateral;  ? COLONOSCOPY    ? 2008, normal per patient  ? COLONOSCOPY N/A 01/16/2017  ? Procedure: COLONOSCOPY;  Surgeon: West BaliFields, Sandi L, MD;  [facility];  Service: Endoscopy;  Laterality: N/A;  830 ?  ? COSMETIC SURGERY    ? ESOPHAGOGASTRODUODENOSCOPY N/A 01/16/2017  ? Procedure: ESOPHAGOGASTRODUODENOSCOPY (EGD);  Surgeon: West BaliFields, Sandi L, MD;  [facility];  Service: Endoscopy;  Laterality: N/A;  ? PARTIAL COLECTOMY N/A 06/29/2016  ? Procedure: PARTIAL COLECTOMY;  Surgeon: Ancil LinseyJason Evan Davis, MD;  Location: [facility];  Service: General;  Laterality: N/A;  ? 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 Procedure: TOTAL KNEE ARTHROPLASTY;  Surgeon: Vickki HearingHarrison, Stanley E, MD;  Location: [facility];  Service: Orthopedics;  Laterality: Left;  DePuy   ? TUBAL LIGATION    ? ? ?Social History  ? ?Social History Narrative  ? Carley Hammedva is retired and  her 75 year old granddaughter lives with her. She has two daughters and one son, all local that she sees regularly. She is active in church and sings in the choir. She enjoys crafting, yard work, and  shopping.   ? ? ? reports that she quit smoking about 28 years ago. Her smoking use included cigarettes. She smoked an average of 3 packs per day. She has never used smokeless tobacco. She reports that she does not currently use alcohol. She reports that she does not use drugs. ? ?No Known Allergies ? ?Family History  ?Problem Relation Age of Onset  ? Kidney disease Mother   ? Stroke Mother   ? Heart attack Father   ? Hyperlipidemia Sister   ? Hypertension Sister   ? Diabetes Sister   ? Hypertension Brother   ? Hyperlipidemia Brother   ?  Hypertension Sister   ? Hyperlipidemia Sister   ? Colon cancer Neg Hx   ? ? ? ?Prior to Admission medications   ?Medication Sig Start Date End Date Taking? Authorizing Provider  ?amLODipine (NORVASC) 10 MG tablet Take 1 tablet (10 mg total) by mouth daily. 05/15/21  Yes Raliegh Ip, DO  ?aspirin EC 325 MG EC tablet Take 1 tablet (325 mg total) by mouth daily with breakfast. 02/13/18  Yes Vickki Hearing, MD  ?atorvastatin (LIPITOR) 20 MG tablet Take 1 tablet (20 mg total) by mouth daily. 05/15/21  Yes Raliegh Ip, DO  ?Calcium Carb-Cholecalciferol (CALCIUM 600+D3 PO) Take 1 tablet by mouth daily.   Yes [provider]  ?dextromethorphan-guaiFENesin (MUCINEX DM) 30-600 MG 12hr tablet Take 1 tablet by mouth 2 (two) times daily. 03/05/21  Yes Daryll Drown, NP  ?diclofenac (VOLTAREN) 75 MG EC tablet TAKE 1 TABLET BY MOUTH TWICE DAILY AS NEEDED FOR MODERATE PAIN ?Patient taking differently: Take 75 mg by mouth 2 (two) times daily as needed for moderate pain. 12/09/21  Yes Delynn Flavin M, DO  ?ferrous sulfate 325 (65 FE) MG tablet Take 325 mg by mouth daily.   Yes [provider]  ?gabapentin (NEURONTIN) 300 MG capsule Take 1 capsule (300 mg total) by mouth 2 (two) times daily. 05/15/21  Yes Raliegh Ip, DO  ?Glucosamine HCl-MSM (GLUCOSAMINE-MSM PO) Take 2 tablets by mouth daily.   Yes [provider]  ?mometasone (NASONEX) 50 MCG/ACT nasal spray Place 2 sprays into the nose daily. 03/05/21  Yes Daryll Drown, NP  ?Multiple Vitamins-Minerals (MULTI-VITAMIN GUMMIES PO) Take 2 each by mouth daily.    Yes [provider]  ?omeprazole (PRILOSEC) 40 MG capsule Take 1 capsule (40 mg total) by mouth daily. 05/15/21  Yes Gottschalk, Kathie Rhodes M, DO  ?sucralfate (CARAFATE) 1 g tablet TAKE 1 TABLET BY MOUTH 4 TIMES DAILY WITH MEALS AND AT BEDTIME ?Patient taking differently: Take 1 g by mouth See admin instructions. TAKE 1 TABLET BY MOUTH 4 TIMES DAILY WITH MEALS AND AT  BEDTIME 08/12/21  Yes Gottschalk, Ashly M, DO  ?tiZANidine (ZANAFLEX) 4 MG tablet TAKE 1/2 TO 1 (ONE-HALF TO ONE) TABLET BY MOUTH EVERY 8 HOURS AS NEEDED FOR MUSCLE SPASM. USE SPARINGLY. ?Patient taking differently: Take 4 mg by mouth at bedtime. 09/24/21  Yes Delynn Flavin M, DO  ?tolterodine (DETROL LA) 2 MG 24 hr capsule Take 1 capsule (2 mg total) by mouth daily. 05/15/21  Yes Delynn Flavin M, DO  ?traMADol (ULTRAM) 50 MG tablet Take 1 tablet (50 mg total) by mouth every 12 (twelve) hours as needed for moderate pain or severe pain. 11/13/21  Yes Delynn Flavin M, DO  ?sodium chloride (OCEAN) 0.65 % SOLN nasal spray Place 1 spray into the nose as needed for congestion. ?Patient not taking: Reported on 12/13/2021 03/05/21  Daryll Drown, NP  ? ? ?Physical Exam: ? ?Constitutional: NAD, calm, comfortable ?Vitals:  ? 12/13/21 1410 12/13/21 1500 12/13/21 1600 12/13/21 1700  ?BP: 123/90 112/75 (!) 123/91 114/80  ?Pulse: 91 87 87 85  ?Resp: 17 15 18 16   ?Temp:      ?SpO2: 100% 98% 96% 100%  ?Weight:      ?Height:      ? ?Eyes: PERRL, lids and conjunctivae normal ?ENMT: Mucous membranes are moist. Posterior pharynx clear of any exudate or lesions.Normal dentition.  ?Neck: normal, supple, no masses, no thyromegaly ?Respiratory: clear to auscultation bilaterally, no wheezing, no crackles. Normal respiratory effort. No accessory muscle use.  ?Cardiovascular: Regular rate and rhythm, no murmurs / rubs / gallops. No extremity edema. 2+ pedal pulses. No carotid bruits.  ?Abdomen: Diffuse generalized tenderness, no masses palpated. No hepatosplenomegaly. Bowel sounds proactive, slightly distended ?Musculoskeletal: no clubbing / cyanosis. No joint deformity upper and lower extremities. Good ROM, no contractures. Normal muscle tone.  ?Skin: no rashes, lesions, ulcers. No induration ?Neurologic: CN 2-12 grossly intact. Sensation intact, DTR normal. Strength 5/5 in all 4.  ?Psychiatric: Normal judgment and insight. Alert  and oriented x 3. Normal mood.  ? ? ?Labs on Admission: I have personally reviewed following labs and imaging studies ? ?CBC: ?Recent Labs  ?Lab 12/13/21 ?1252  ?WBC 9.4  ?HGB 11.3*  ?HCT 35.4*  ?MCV 79.6*

## 2021-12-13 NOTE — ED Notes (Signed)
Patient transported to CT 

## 2021-12-13 NOTE — ED Notes (Signed)
Patient unable to tolerate NG tube at larger size. Attempted different sizes without success.  ?

## 2021-12-14 ENCOUNTER — Inpatient Hospital Stay (HOSPITAL_COMMUNITY): Payer: Medicare HMO

## 2021-12-14 DIAGNOSIS — I10 Essential (primary) hypertension: Secondary | ICD-10-CM

## 2021-12-14 DIAGNOSIS — M170 Bilateral primary osteoarthritis of knee: Secondary | ICD-10-CM

## 2021-12-14 DIAGNOSIS — R739 Hyperglycemia, unspecified: Secondary | ICD-10-CM

## 2021-12-14 DIAGNOSIS — K56609 Unspecified intestinal obstruction, unspecified as to partial versus complete obstruction: Secondary | ICD-10-CM | POA: Diagnosis not present

## 2021-12-14 DIAGNOSIS — K219 Gastro-esophageal reflux disease without esophagitis: Secondary | ICD-10-CM | POA: Diagnosis not present

## 2021-12-14 DIAGNOSIS — R1084 Generalized abdominal pain: Secondary | ICD-10-CM

## 2021-12-14 LAB — GLUCOSE, CAPILLARY
Glucose-Capillary: 145 mg/dL — ABNORMAL HIGH (ref 70–99)
Glucose-Capillary: 103 mg/dL — ABNORMAL HIGH (ref 70–99)

## 2021-12-14 LAB — HEMOGLOBIN A1C
Hgb A1c MFr Bld: 5.8 % — ABNORMAL HIGH (ref 4.8–5.6)
Mean Plasma Glucose: 119.76 mg/dL

## 2021-12-14 LAB — BASIC METABOLIC PANEL
Anion gap: 7 (ref 5–15)
BUN: 13 mg/dL (ref 8–23)
CO2: 27 mmol/L (ref 22–32)
Calcium: 8.9 mg/dL (ref 8.9–10.3)
Chloride: 107 mmol/L (ref 98–111)
Creatinine, Ser: 0.5 mg/dL (ref 0.44–1.00)
GFR, Estimated: >60 mL/min (ref >60)
Glucose, Bld: 124 mg/dL — ABNORMAL HIGH (ref 70–99)
Potassium: 4.3 mmol/L (ref 3.5–5.1)
Sodium: 141 mmol/L (ref 135–145)

## 2021-12-14 LAB — CBC
HCT: 34.9 % — ABNORMAL LOW (ref 36.0–46.0)
Hemoglobin: 11.1 g/dL — ABNORMAL LOW (ref 12.0–15.0)
MCH: 25.7 pg — ABNORMAL LOW (ref 26.0–34.0)
MCHC: 31.8 g/dL (ref 30.0–36.0)
MCV: 80.8 fL (ref 80.0–100.0)
Platelets: 325 10*3/uL (ref 150–400)
RBC: 4.32 MIL/uL (ref 3.87–5.11)
RDW: 16.8 % — ABNORMAL HIGH (ref 11.5–15.5)
WBC: 5.7 10*3/uL (ref 4.0–10.5)
nRBC: 0 % (ref 0.0–0.2)

## 2021-12-14 NOTE — Assessment & Plan Note (Addendum)
-  Continue as needed analgesics. 

## 2021-12-14 NOTE — Consult Note (Signed)
Reason for Consult: Small bowel obstruction ?Referring Physician: Dr. [name] ? ?Tiffany Bradley is an 76 y.o. female.  ?HPI: Tiffany Bradley is a 76 year old black female who presented to the emergency room with a 3-day history of worsening abdominal pain.  She had some nausea and vomiting but no hematemesis.  She has been feeling bloated for approximately 1 week.  Her last bowel movement was 5 days ago.  Her pain is intermittent and crampy in nature.  She presented to the emergency room and was found to have a small bowel obstruction in the lower abdomen possibly secondary to an internal hernia.  An NG tube was placed and the Tiffany Bradley states that her abdominal pain has eased somewhat.  She has not passed gas or had a bowel movement yet.  She is status post a sigmoid colectomy for perforated diverticulum in 2017.  A follow-up colonoscopy was performed in 2018.  She denies any blood per rectum.  She has no abdominal pain while lying in the bed. ? ?Past Medical History:  ?Diagnosis Date  ? Arthritis   ? Back pain   ? Cataract   ? DJD (degenerative joint disease)   ? Full dentures   ? GERD (gastroesophageal reflux disease)   ? Glaucoma   ? History of bleeding ulcers   ? Tiffany Bradley reports multiple hospitalizations remotely  ? Hypertension   ? Wears glasses   ? ? ?Past Surgical History:  ?Procedure Laterality Date  ? BREAST REDUCTION SURGERY Bilateral 04/25/2013  ? Procedure: MAMMARY REDUCTION  (BREAST);  Surgeon: Louisa Second, MD;  Location: [facility];  Service: Plastics;  Laterality: Bilateral;  ? COLONOSCOPY    ? 2008, normal per Tiffany Bradley  ? COLONOSCOPY N/A 01/16/2017  ? Procedure: COLONOSCOPY;  Surgeon: West Bali, MD;  [facility];  Service: Endoscopy;  Laterality: N/A;  830 ?  ? COSMETIC SURGERY    ? ESOPHAGOGASTRODUODENOSCOPY N/A 01/16/2017  ? Procedure: ESOPHAGOGASTRODUODENOSCOPY (EGD);  Surgeon: West Bali, MD;  [facility];  Service: Endoscopy;  Laterality: N/A;  ? PARTIAL  COLECTOMY N/A 06/29/2016  ? Procedure: PARTIAL COLECTOMY;  Surgeon: Ancil Linsey, MD;  Location: [facility];  Service: General;  Laterality: N/A;  ? 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 Procedure: TOTAL KNEE ARTHROPLASTY;  Surgeon: Vickki Hearing, MD;  Location: [facility];  Service: Orthopedics;  Laterality: Left;  DePuy   ? TUBAL LIGATION    ? ? ?Family History  ?Problem Relation Age of Onset  ? Kidney disease Mother   ? Stroke Mother   ? Heart attack Father   ? Hyperlipidemia Sister   ? Hypertension Sister   ? Diabetes Sister   ? Hypertension Brother   ? Hyperlipidemia Brother   ? Hypertension Sister   ? Hyperlipidemia Sister   ? Colon cancer Neg Hx   ? ? ?Social History:  reports that she quit smoking about 28 years ago. Her smoking use included cigarettes. She smoked an average of 3 packs per day. She has never used smokeless tobacco. She reports that she does not currently use alcohol. She reports that she does not use drugs. ? ?Allergies: No Known Allergies ? ?Medications: I have reviewed the Tiffany Bradley's current medications. ? ?Results for orders placed or performed during the hospital encounter of 12/13/21 (from the past 48 hour(s))  ?Lipase, blood     Status: None  ? Collection Time: 12/13/21 12:52 PM  ?Result Value Ref Range  ? Lipase 35 11 -  51 U/L  ?  Comment: Performed at [facility], [address] [place], [place] [address]  ?Comprehensive metabolic panel     Status: Abnormal  ? Collection Time: 12/13/21 12:52 PM  ?Result Value Ref Range  ? Sodium 137 135 - 145 mmol/L  ? Potassium 3.8 3.5 - 5.1 mmol/L  ? Chloride 104 98 - 111 mmol/L  ? CO2 25 22 - 32 mmol/L  ? Glucose, Bld 164 (H) 70 - 99 mg/dL  ?  Comment: Glucose reference range applies only to samples taken after fasting for at least 8 hours.  ? BUN 14 8 - 23 mg/dL  ? Creatinine, Ser 0.48 0.44 - 1.00 mg/dL  ? Calcium 9.5 8.9 - 10.3 mg/dL  ? Total Protein 7.9 6.5 - 8.1 g/dL  ? Albumin 4.2 3.5 - 5.0 g/dL  ? AST 26 15 - 41 U/L  ? ALT 22 0 - 44  U/L  ? Alkaline Phosphatase 116 38 - 126 U/L  ? Total Bilirubin 0.8 0.3 - 1.2 mg/dL  ? GFR, Estimated >60 >60 mL/min  ?  Comment: (NOTE) ?Calculated using the CKD-EPI Creatinine Equation (2021) ?  ? Anion gap 8 5 - 15  ?  Comment: Performed at [facility], [address], [place], [place] [address]  ?CBC     Status: Abnormal  ? Collection Time: 12/13/21 12:52 PM  ?Result Value Ref Range  ? WBC 9.4 4.0 - 10.5 K/uL  ? RBC 4.45 3.87 - 5.11 MIL/uL  ? Hemoglobin 11.3 (L) 12.0 - 15.0 g/dL  ? HCT 35.4 (L) 36.0 - 46.0 %  ? MCV 79.6 (L) 80.0 - 100.0 fL  ? MCH 25.4 (L) 26.0 - 34.0 pg  ? MCHC 31.9 30.0 - 36.0 g/dL  ? RDW 16.7 (H) 11.5 - 15.5 %  ? Platelets 326 150 - 400 K/uL  ? nRBC 0.0 0.0 - 0.2 %  ?  Comment: Performed at [facility], [address], [place], [place] [address]  ?Urinalysis, Routine w reflex microscopic Urine, Clean Catch     Status: Abnormal  ? Collection Time: 12/13/21  2:30 PM  ?Result Value Ref Range  ? Color, Urine YELLOW YELLOW  ? APPearance HAZY (A) CLEAR  ? Specific Gravity, Urine 1.020 1.005 - 1.030  ? pH 9.0 (H) 5.0 - 8.0  ? Glucose, UA NEGATIVE NEGATIVE mg/dL  ? Hgb urine dipstick NEGATIVE NEGATIVE  ? Bilirubin Urine NEGATIVE NEGATIVE  ? Ketones, ur NEGATIVE NEGATIVE mg/dL  ? Protein, ur 100 (A) NEGATIVE mg/dL  ? Nitrite NEGATIVE NEGATIVE  ? Leukocytes,Ua TRACE (A) NEGATIVE  ? RBC / HPF 0-5 0 - 5 RBC/hpf  ? WBC, UA 6-10 0 - 5 WBC/hpf  ? Bacteria, UA RARE (A) NONE SEEN  ? Squamous Epithelial / LPF 0-5 0 - 5  ? Mucus PRESENT   ? Budding Yeast PRESENT   ?  Comment: Performed at [facility], [address] [place], [place] [address]  ?POC occult blood, ED     Status: None  ? Collection Time: 12/13/21  2:55 PM  ?Result Value Ref Range  ? Fecal Occult Bld NEGATIVE NEGATIVE  ?Basic metabolic panel     Status: Abnormal  ? Collection Time: 12/14/21  4:34 AM  ?Result Value Ref Range  ? Sodium 141 135 - 145 mmol/L  ? Potassium 4.3 3.5 - 5.1 mmol/L  ? Chloride 107 98 - 111 mmol/L  ? CO2 27 22 - 32 mmol/L  ?  Glucose, Bld 124 (H) 70 -  99 mg/dL  ?  Comment: Glucose reference range applies only to samples taken after fasting for at least 8 hours.  ? BUN 13 8 - 23 mg/dL  ? Creatinine, Ser 0.50 0.44 - 1.00 mg/dL  ? Calcium 8.9 8.9 - 10.3 mg/dL  ? GFR, Estimated >60 >60 mL/min  ?  Comment: (NOTE) ?Calculated using the CKD-EPI Creatinine Equation (2021) ?  ? Anion gap 7 5 - 15  ?  Comment: Performed at [facility], [address] [place], [place] [address]  ?CBC     Status: Abnormal  ? Collection Time: 12/14/21  4:34 AM  ?Result Value Ref Range  ? WBC 5.7 4.0 - 10.5 K/uL  ? RBC 4.32 3.87 - 5.11 MIL/uL  ? Hemoglobin 11.1 (L) 12.0 - 15.0 g/dL  ? HCT 34.9 (L) 36.0 - 46.0 %  ? MCV 80.8 80.0 - 100.0 fL  ? MCH 25.7 (L) 26.0 - 34.0 pg  ? MCHC 31.8 30.0 - 36.0 g/dL  ? RDW 16.8 (H) 11.5 - 15.5 %  ? Platelets 325 150 - 400 K/uL  ? nRBC 0.0 0.0 - 0.2 %  ?  Comment: Performed at [facility], [address] [place], [place] [address]  ?Glucose, capillary     Status: Abnormal  ? Collection Time: 12/14/21  7:46 AM  ?Result Value Ref Range  ? Glucose-Capillary 145 (H) 70 - 99 mg/dL  ?  Comment: Glucose reference range applies only to samples taken after fasting for at least 8 hours.  ? ? ?DG Abd 1 View ? ?Result Date: 12/14/2021 ?CLINICAL DATA:  NG tube placement EXAM: ABDOMEN - 1 VIEW COMPARISON:  Study done earlier today FINDINGS: There is interval repositioning of enteric tube with its tip in the lateral aspect of fundus of the stomach. Dilated small bowel loops are noted measuring up to 4.8 cm in diameter. IMPRESSION: Tip of enteric tube is noted within fundus of the stomach. There is dilation of small-bowel loops suggesting small-bowel obstruction. Electronically Signed   By: Ernie AvenaPalani  Rathinasamy M.D.   On: 12/14/2021 09:51  ? ?DG Abdomen 1 View ? ?Result Date: 12/13/2021 ?CLINICAL DATA:  NG tube placement. EXAM: ABDOMEN - 1 VIEW COMPARISON:  Abdominopelvic CT earlier today FINDINGS: Tip and side port of the enteric tube below the  diaphragm in the stomach. Gaseous distention of small bowel in the upper abdomen as seen on CT. Excreted IV contrast within both renal collecting systems. IMPRESSION: Tip and side port of the enteric tube below th

## 2021-12-14 NOTE — Assessment & Plan Note (Addendum)
-  Stable overall ?-Continue following heart healthy diet ?-Resume home antihypertensive regimen. ? ?

## 2021-12-14 NOTE — Assessment & Plan Note (Addendum)
-  No prior history of diabetes ?-A1c 5.8 ?-Patient met criteria for prediabetes. ?-Will recommend aggressive Lifestyle changes, modify carbohydrate diet and close outpatient monitoring. ?-If patient failed to control blood glucose in the next 3 months we will recommend initiation of therapy with metformin. ?

## 2021-12-14 NOTE — Progress Notes (Signed)
NG tube placement was verified and is correct place. Suction restarted. Pain medication was given to relieve 10/10 pain.  ?

## 2021-12-14 NOTE — Progress Notes (Signed)
?  Transition of Care (TOC) Screening Note ? ? ?Patient Details  ?Name: Tiffany Bradley ?Date of Birth: 06-Dec-1945 ? ? ?Transition of Care (TOC) CM/SW Contact:    ?Villa Herb, LCSWA ?Phone Number: ?12/14/2021, 10:21 AM ? ? ? ?Transition of Care Department [facility]) has reviewed patient and no TOC needs have been identified at this time. We will continue to monitor patient advancement through interdisciplinary progression rounds. If new patient transition needs arise, please place a TOC consult. ?  ?

## 2021-12-14 NOTE — Assessment & Plan Note (Addendum)
-  Continue outpatient follow-up with PCP ?-Recommending initiation of calcium vitamin D. ?

## 2021-12-14 NOTE — Progress Notes (Signed)
?Progress Note ? ? ?[name]:[id number] DOB: 1945/12/17 DOA: 12/13/2021     1 ?DOS: the patient was seen and examined on 12/14/2021 ?  ?Brief hospital course: ?As per H&P written by Dr. Evangeline Gula on 12/13/2021 ?Tiffany Bradley is a 76 y.o. female with medical history significant of Essential hypertension, primary osteoarthritis of both knees, gastroesophageal reflux disease, age-related osteoporosis, overactive bladder who presents to the emergency department with complaints of abdominal pain for 3 days.  She has had some vomiting and nausea but no diarrhea.  She has not had a bowel movement in a few days and thought she was constipated.  She took some MiraLAX on Monday but felt bloated at that time and was not having the pain.  Last night she continued to vomit until about 5 AM but has not eaten anything in the past 2 to 3 days.  Patient had a prior abdominal surgery with a partial colectomy done in 2017.  Unsure as to why it was performed.  Patient had a follow-up colonoscopy in 2018.  She has had no episodes of hematemesis. ?  ?ED Course: Patient fully evaluated in the emergency department: Labs significant for glucose of 164, movement of 11.3 which is improved from 10.7 a month ago.  Urinalysis is unremarkable.  Fecal occult blood is negative.  CT scan shows a high-grade small bowel obstruction with a transition point in the right lower abdomen and a swirling appearance of the small bowel concerning for internal hernia.  Case was discussed with general surgery who recommended to medicine service, NG tube and will follow-up with patient. ?  ?Assessment and Plan: ?* SBO (small bowel obstruction) (HCC) ?-NG tube in place intermittent suction ?-Continue fluid resuscitation, electrolyte repletion, as needed antiemetics and analgesics. ?-Follow general surgery recommendations ?-Currently providing conservative management ?-Follow clinical response. ? ?Hyperglycemia ?-No prior history of diabetes ?-A1c  5.8 ?-Patient met criteria for prediabetes ?-Lifestyle changes, modify carbohydrate diet and close outpatient monitoring recommended. ?-If patient failed to control blood glucose in the next 3 months or further improve A1c given component of overweight might benefit of low-dose metformin. ? ?OAB (overactive bladder) ?-resume home meds again when able to take PO's. ? ?Age-related osteoporosis without current pathological fracture ?-Continue outpatient follow-up with PCP ?-When able to tolerate by mouth will recommend the use of calcium and vitamin D. ? ?GERD (gastroesophageal reflux disease) ?- Continue PPI. ? ?Primary osteoarthritis of both knees ?- Continue as needed analgesics. ? ?Essential hypertension ?- Stable overall ?-Holding oral antihypertensive agents for now given n.p.o. status and SBO. ?-Follow vital signs and provide as needed medications as needed ? ? ? ?Subjective:  ?NG tube in place; reporting still ongoing intermittent abdominal pain and nausea.  Patient is afebrile. ? ?Physical Exam: ?Vitals:  ? 12/13/21 2157 12/14/21 0204 12/14/21 0600 12/14/21 1324  ?BP: 112/85 114/75 125/80 140/77  ?Pulse: 94 98 84 90  ?Resp: _0 ?Temp: 99.3 ?F (37.4 ?C) 98.9 ?F (37.2 ?C) 98.9 ?F (37.2 ?C) 99.3 ?F (37.4 ?C)  ?TempSrc: Oral Oral Oral Oral  ?SpO2: 95% 97% 100% 99%  ?Weight:   76.1 kg   ?Height:      ? ?General exam: Alert, awake, oriented x 3; reporting intermittent abdominal pain and nausea.  No fever ?Respiratory system: Clear to auscultation. Respiratory effort normal.  Good saturation on room air. ?Cardiovascular system:RRR. No murmurs, rubs, gallops.  No JVD ?Gastrointestinal system: Abdomen is soft, with vague diffuse abdominal discomfort and decreased  but present bowel sounds.   ?Central nervous system: Alert and oriented. No focal neurological deficits. ?Extremities: No cyanosis or clubbing ?Skin: No petechiae  ?Psychiatry: Judgement and insight appear normal. Mood & affect appropriate.  ? ?Data  Reviewed: ?A1C 5.8 ?CBC demonstrating a hemoglobin of 11.1, white blood cells 5.7, platelets 325 K ?Basic metabolic panel with a sodium of 141, potassium 4.3, chloride 107, creatinine 0.50 and BUN 13. ? ? ?Family Communication: Sister at bedside. ? ?Disposition: ?Status is: Inpatient ?Remains inpatient appropriate because: Patient with ongoing SBO, requiring IV therapy, n.p.o. status and NG tube in place. ? ? Planned Discharge Destination: Home ? ? ?Author: ?Barton Dubois, MD ?12/14/2021 4:53 PM ? ?For on call review [email or web address].  ?

## 2021-12-14 NOTE — Assessment & Plan Note (Addendum)
-  NG tube successfully removed and patient able to tolerate diet prior to discharge. ?-No complaining of abdominal pain. ?-Passing gas and had bowel movement. ?-Advised to maintain adequate hydration, to increase fiber intake and to use bowel regimen as prescribed (MiraLAX and Colace). ?

## 2021-12-14 NOTE — Assessment & Plan Note (Addendum)
-  resume the use of Detrol ?

## 2021-12-14 NOTE — Assessment & Plan Note (Addendum)
Continue PPI ?

## 2021-12-14 NOTE — Progress Notes (Signed)
NG tube advanced 16cm clear fluid noted. MD notified and Chest xray ordered. Low intermittent  suction stopped until results are in.  ?

## 2021-12-15 ENCOUNTER — Inpatient Hospital Stay (HOSPITAL_COMMUNITY): Payer: Medicare HMO

## 2021-12-15 DIAGNOSIS — I10 Essential (primary) hypertension: Secondary | ICD-10-CM | POA: Diagnosis not present

## 2021-12-15 DIAGNOSIS — K219 Gastro-esophageal reflux disease without esophagitis: Secondary | ICD-10-CM | POA: Diagnosis not present

## 2021-12-15 DIAGNOSIS — K56609 Unspecified intestinal obstruction, unspecified as to partial versus complete obstruction: Secondary | ICD-10-CM | POA: Diagnosis not present

## 2021-12-15 DIAGNOSIS — R1084 Generalized abdominal pain: Secondary | ICD-10-CM | POA: Diagnosis not present

## 2021-12-15 LAB — CBC
HCT: 32.6 % — ABNORMAL LOW (ref 36.0–46.0)
Hemoglobin: 10.5 g/dL — ABNORMAL LOW (ref 12.0–15.0)
MCH: 25.7 pg — ABNORMAL LOW (ref 26.0–34.0)
MCHC: 32.2 g/dL (ref 30.0–36.0)
MCV: 79.7 fL — ABNORMAL LOW (ref 80.0–100.0)
Platelets: 290 10*3/uL (ref 150–400)
RBC: 4.09 MIL/uL (ref 3.87–5.11)
RDW: 16.7 % — ABNORMAL HIGH (ref 11.5–15.5)
WBC: 3.4 10*3/uL — ABNORMAL LOW (ref 4.0–10.5)
nRBC: 0 % (ref 0.0–0.2)

## 2021-12-15 LAB — BASIC METABOLIC PANEL
Anion gap: 7 (ref 5–15)
BUN: 12 mg/dL (ref 8–23)
CO2: 24 mmol/L (ref 22–32)
Calcium: 8.6 mg/dL — ABNORMAL LOW (ref 8.9–10.3)
Chloride: 109 mmol/L (ref 98–111)
Creatinine, Ser: 0.37 mg/dL — ABNORMAL LOW (ref 0.44–1.00)
GFR, Estimated: >60 mL/min (ref >60)
Glucose, Bld: 111 mg/dL — ABNORMAL HIGH (ref 70–99)
Potassium: 3.4 mmol/L — ABNORMAL LOW (ref 3.5–5.1)
Sodium: 140 mmol/L (ref 135–145)

## 2021-12-15 LAB — GLUCOSE, CAPILLARY: Glucose-Capillary: 102 mg/dL — ABNORMAL HIGH (ref 70–99)

## 2021-12-15 LAB — MAGNESIUM: Magnesium: 1.9 mg/dL (ref 1.7–2.4)

## 2021-12-15 MED ORDER — DIATRIZOATE MEGLUMINE & SODIUM 66-10 % PO SOLN
ORAL | Status: AC
  Administered 2021-12-15: 90 mL via NASOGASTRIC
  Filled 2021-12-15: qty 90

## 2021-12-15 MED ORDER — DIATRIZOATE MEGLUMINE & SODIUM 66-10 % PO SOLN: 90.0000 mL | Freq: Once | ORAL | Status: AC

## 2021-12-15 MED ORDER — POTASSIUM CHLORIDE 10 MEQ/100ML IV SOLN
10.0000 meq | INTRAVENOUS | Status: AC
  Administered 2021-12-15 (×2): 10 meq via INTRAVENOUS
  Filled 2021-12-15 (×2): qty 100

## 2021-12-15 NOTE — Progress Notes (Signed)
?Subjective: ?Patient states her abdominal pain has improved.  No flatus or bowel movement yet. ? ?Objective: ?Vital signs in last 24 hours: ?Temp:  [98.6 ?F (37 ?C)-99.7 ?F (37.6 ?C)] 98.6 ?F (37 ?C) (04/16 0511) ?Pulse Rate:  [90-95] 92 (04/16 0511) ?Resp:  [16-17] 17 (04/16 0511) ?BP: (129-140)/(77-87) 129/87 (04/16 4917) ?SpO2:  [98 %-99 %] 99 % (04/16 0511) ?Last BM Date : 12/11/21 ? ?Intake/Output from previous day: ?04/15 0701 - 04/16 0700 ?In: 730.5 [I.V.:730.5] ?Out: 1200 [Urine:900; Emesis/NG output:300] ?Intake/Output this shift: ?Total I/O ?In: -  ?Out: 900 [Emesis/NG output:450; Other:450] ? ?General appearance: alert, cooperative, and no distress ?GI: Soft, no bowel sounds appreciated.  No tenderness noted. ? ?Lab Results:  ?Recent Labs  ?  12/14/21 ?0434 12/15/21 ?0413  ?WBC 5.7 3.4*  ?HGB 11.1* 10.5*  ?HCT 34.9* 32.6*  ?PLT 325 290  ? ?BMET ?Recent Labs  ?  12/14/21 ?0434 12/15/21 ?0413  ?NA 141 140  ?K 4.3 3.4*  ?CL 107 109  ?CO2 27 24  ?GLUCOSE 124* 111*  ?BUN 13 12  ?CREATININE 0.50 0.37*  ?CALCIUM 8.9 8.6*  ? ?PT/INR ?No results for input(s): LABPROT, INR in the last 72 hours. ? ?Studies/Results: ?DG Abd 1 View ? ?Result Date: 12/14/2021 ?CLINICAL DATA:  NG tube placement EXAM: ABDOMEN - 1 VIEW COMPARISON:  Study done earlier today FINDINGS: There is interval repositioning of enteric tube with its tip in the lateral aspect of fundus of the stomach. Dilated small bowel loops are noted measuring up to 4.8 cm in diameter. IMPRESSION: Tip of enteric tube is noted within fundus of the stomach. There is dilation of small-bowel loops suggesting small-bowel obstruction. Electronically Signed   By: Ernie Avena M.D.   On: 12/14/2021 09:51  ? ?DG Abdomen 1 View ? ?Result Date: 12/13/2021 ?CLINICAL DATA:  NG tube placement. EXAM: ABDOMEN - 1 VIEW COMPARISON:  Abdominopelvic CT earlier today FINDINGS: Tip and side port of the enteric tube below the diaphragm in the stomach. Gaseous distention of  small bowel in the upper abdomen as seen on CT. Excreted IV contrast within both renal collecting systems. IMPRESSION: Tip and side port of the enteric tube below the diaphragm in the stomach. Electronically Signed   By: Narda Rutherford M.D.   On: 12/13/2021 17:19  ? ?CT Abdomen Pelvis W Contrast ? ?Result Date: 12/13/2021 ?CLINICAL DATA:  Constipation.  Abdominal pain. EXAM: CT ABDOMEN AND PELVIS WITH CONTRAST TECHNIQUE: Multidetector CT imaging of the abdomen and pelvis was performed using the standard protocol following bolus administration of intravenous contrast. RADIATION DOSE REDUCTION: This exam was performed according to the departmental dose-optimization program which includes automated exposure control, adjustment of the mA and/or kV according to patient size and/or use of iterative reconstruction technique. CONTRAST:  OMNIPAQUE IOHEXOL 300 MG/ML  SOLN COMPARISON:  06/29/2016 FINDINGS: Lower chest: No acute abnormality. Hepatobiliary: No focal liver abnormality is seen. No gallstones, gallbladder wall thickening, or biliary dilatation. Pancreas: Unremarkable. No pancreatic ductal dilatation or surrounding inflammatory changes. Spleen: Normal in size without focal abnormality. Adrenals/Urinary Tract: Adrenal glands are unremarkable. Kidneys are normal, without renal calculi, focal lesion, or hydronephrosis. Bladder is unremarkable. Stomach/Bowel: Stomach is normal. Multiple fluid-filled loops of dilated small bowel measuring up to 3.6 cm with a transition point in the right lower abdomen with a swirling appearance of the small bowel concerning for a high-grade small bowel obstruction secondary to an internal hernia. Moderate amount of stool in the colon. No pneumoperitoneum, pneumatosis or portal  venous gas. Vascular/Lymphatic: Normal caliber abdominal aorta with mild atherosclerosis. No lymphadenopathy. Reproductive: Calcified uterine mass likely reflecting a fibroid. Other: No abdominal wall hernia  or abnormality. No abdominopelvic ascites. Musculoskeletal: No acute osseous abnormality. No aggressive osseous lesion. Severe osteoarthritis of the left hip. IMPRESSION: 1. High-grade small bowel obstruction with a transition point in the right lower abdomen with a swirling appearance of the small bowel concerning for an internal hernia. 2. Aortic Atherosclerosis (ICD10-I70.0). Electronically Signed   By: Elige Ko M.D.   On: 12/13/2021 15:28  ? ?Acute Abdominal Series ? ?Result Date: 12/14/2021 ?CLINICAL DATA:  76 year old female with abdominal pain and constipation. Small-bowel obstruction on CT yesterday. EXAM: DG ABDOMEN ACUTE WITH 1 VIEW CHEST COMPARISON:  CT Abdomen and Pelvis 12/13/2021. FINDINGS: Portable upright and supine views of the chest, abdomen, and pelvis. Enteric tube terminates in the distal esophagus. Side hole is about 8 cm above the diaphragm. Unchanged small-bowel obstruction gas pattern. No pneumoperitoneum identified. Calcified uterine fibroid projects over the central pelvis. Chronic severe left hip osteoarthritis. No acute osseous abnormality identified. Lung volumes and mediastinal contours are within normal limits aside from tortuosity of the thoracic aorta. Visualized tracheal air column is within normal limits. Allowing for portable technique the lungs are clear. No pneumothorax. IMPRESSION: 1. Enteric tube is in the esophagus. Advance 16 cm to ensure side hole placement within the stomach. 2. Stable small-bowel obstruction. 3.  No acute cardiopulmonary abnormality. Electronically Signed   By: Odessa Fleming M.D.   On: 12/14/2021 07:36   ? ?Anti-infectives: ?Anti-infectives (From admission, onward)  ? ? None  ? ?  ? ? ?Assessment/Plan: ?Impression: Small bowel obstruction versus ileus.  NG tube output 400 cc. ?Plan: We will proceed with small bowel obstruction protocol.  Further management is pending those results. ? LOS: 2 days  ? ? ?Franky Macho ?12/15/2021  ?

## 2021-12-15 NOTE — Progress Notes (Signed)
?Progress Note ? ? ?[name] GQQ:[id number] DOB: 02/06/1946 DOA: 12/13/2021     2 ?DOS: the patient was seen and examined on 12/15/2021 ?  ?Brief hospital course: ?As per H&P written by Dr. Evangeline Gula on 12/13/2021 ?Tiffany Bradley is a 76 y.o. female with medical history significant of Essential hypertension, primary osteoarthritis of both knees, gastroesophageal reflux disease, age-related osteoporosis, overactive bladder who presents to the emergency department with complaints of abdominal pain for 3 days.  She has had some vomiting and nausea but no diarrhea.  She has not had a bowel movement in a few days and thought she was constipated.  She took some MiraLAX on Monday but felt bloated at that time and was not having the pain.  Last night she continued to vomit until about 5 AM but has not eaten anything in the past 2 to 3 days.  Patient had a prior abdominal surgery with a partial colectomy done in 2017.  Unsure as to why it was performed.  Patient had a follow-up colonoscopy in 2018.  She has had no episodes of hematemesis. ?  ?ED Course: Patient fully evaluated in the emergency department: Labs significant for glucose of 164, movement of 11.3 which is improved from 10.7 a month ago.  Urinalysis is unremarkable.  Fecal occult blood is negative.  CT scan shows a high-grade small bowel obstruction with a transition point in the right lower abdomen and a swirling appearance of the small bowel concerning for internal hernia.  Case was discussed with general surgery who recommended to medicine service, NG tube and will follow-up with patient. ?  ?Assessment and Plan: ?* SBO (small bowel obstruction) (HCC) ?-NG tube in place ?-Continue fluid resuscitation, electrolyte repletion, as needed antiemetics and the use of PRN analgesics. ?-Currently providing conservative management ?-Follow clinical response. ?-Follow recommendations by general surgery will pursuit a small bowel follow-through  images. ? ?Hyperglycemia ?-No prior history of diabetes ?-A1c 5.8 ?-Patient met criteria for prediabetes ?-Lifestyle changes, modify carbohydrate diet and close outpatient monitoring recommended. ?-If patient failed to control blood glucose in the next 3 months or further improve A1c given component of overweight might benefit of low-dose metformin. ? ?OAB (overactive bladder) ?-resume home meds again when able to take PO's. ? ?Age-related osteoporosis without current pathological fracture ?-Continue outpatient follow-up with PCP ?-When able to tolerate by mouth will recommend the use of calcium and vitamin D. ? ?GERD (gastroesophageal reflux disease) ?-Continue PPI. ? ?Primary osteoarthritis of both knees ?-Continue as needed analgesics. ? ? ?Essential hypertension ?-Stable overall ?-Continue holding oral antihypertensive agents. ?-Continue the use of IV metoprolol ? ? ? ? ?Subjective:  ?No fever; NG tube in place.  Patient expressed intermittent nausea. ? ?Physical Exam: ?Vitals:  ? 12/14/21 1324 12/14/21 2047 12/15/21 0511 12/15/21 1445  ?BP: 140/77 140/84 129/87 132/83  ?Pulse: 90 95 92 89  ?Resp: '16 17 17 17  ' ?Temp: 99.3 ?F (37.4 ?C) 99.7 ?F (37.6 ?C) 98.6 ?F (37 ?C) 99.4 ?F (37.4 ?C)  ?TempSrc: Oral Oral Oral   ?SpO2: 99% 98% 99% 98%  ?Weight:      ?Height:      ? ?General exam: Alert, awake, oriented x 3; NG tube in place; reports intermittent nausea and no significant abdominal discomfort. ?Respiratory system: Clear to auscultation. Respiratory effort normal.  Saturation on room air.  No using accessory muscle. ?Cardiovascular system:RRR. No murmurs, rubs, gallops.  No JVD. ?Gastrointestinal system: Abdomen is nondistended, soft and on today's examination denies pain.  Decreased to absent bowel sounds on auscultation. ?Central nervous system: Alert and oriented. No focal neurological deficits. ?Extremities: No cyanosis or clubbing. ?Skin: No rashes, lesions or ulcers ?Psychiatry: Judgement and insight appear  normal. Mood & affect appropriate.  ? ?Data Reviewed: ?A1C 5.8 ?Magnesium 1.9 ?CBC demonstrating WBCs of 3.4; hemoglobin 10.5, platelets count 290 K ?Basic metabolic panel with potassium of 3.4, sodium 140, bicarb 24, BUN 12, creatinine 0.37. ? ? ?Family Communication: Sister at bedside. ? ?Disposition: ?Status is: Inpatient ?Remains inpatient appropriate because: Patient with ongoing SBO, requiring IV therapy, n.p.o. status and NG tube in place. ? ? Planned Discharge Destination: Home ? ? ?Author: ?Barton Dubois, MD ?12/15/2021 4:51 PM ? ?For on call review [email or web address].  ?

## 2021-12-16 DIAGNOSIS — M170 Bilateral primary osteoarthritis of knee: Secondary | ICD-10-CM | POA: Diagnosis not present

## 2021-12-16 DIAGNOSIS — K219 Gastro-esophageal reflux disease without esophagitis: Secondary | ICD-10-CM | POA: Diagnosis not present

## 2021-12-16 DIAGNOSIS — I10 Essential (primary) hypertension: Secondary | ICD-10-CM | POA: Diagnosis not present

## 2021-12-16 DIAGNOSIS — N3281 Overactive bladder: Secondary | ICD-10-CM | POA: Diagnosis not present

## 2021-12-16 DIAGNOSIS — R739 Hyperglycemia, unspecified: Secondary | ICD-10-CM | POA: Diagnosis not present

## 2021-12-16 DIAGNOSIS — K56609 Unspecified intestinal obstruction, unspecified as to partial versus complete obstruction: Secondary | ICD-10-CM | POA: Diagnosis not present

## 2021-12-16 LAB — BASIC METABOLIC PANEL
Anion gap: 10 (ref 5–15)
BUN: 10 mg/dL (ref 8–23)
CO2: 23 mmol/L (ref 22–32)
Calcium: 8.6 mg/dL — ABNORMAL LOW (ref 8.9–10.3)
Chloride: 104 mmol/L (ref 98–111)
Creatinine, Ser: 0.37 mg/dL — ABNORMAL LOW (ref 0.44–1.00)
GFR, Estimated: >60 mL/min (ref >60)
Glucose, Bld: 79 mg/dL (ref 70–99)
Potassium: 3 mmol/L — ABNORMAL LOW (ref 3.5–5.1)
Sodium: 137 mmol/L (ref 135–145)

## 2021-12-16 LAB — GLUCOSE, CAPILLARY: Glucose-Capillary: 74 mg/dL (ref 70–99)

## 2021-12-16 LAB — MAGNESIUM: Magnesium: 2.1 mg/dL (ref 1.7–2.4)

## 2021-12-16 MED ORDER — POLYETHYLENE GLYCOL 3350 17 G PO PACK: 17.0000 g | PACK | Freq: Every day | ORAL | 2 refills | Status: AC

## 2021-12-16 MED ORDER — OMEPRAZOLE 40 MG PO CPDR
40.0000 mg | DELAYED_RELEASE_CAPSULE | Freq: Two times a day (BID) | ORAL | 3 refills | Status: DC
Start: 2021-12-16 — End: 2022-06-09

## 2021-12-16 MED ORDER — DOCUSATE SODIUM 100 MG PO CAPS: 100.0000 mg | ORAL_CAPSULE | Freq: Two times a day (BID) | ORAL | 2 refills | Status: AC

## 2021-12-16 NOTE — Progress Notes (Signed)
Pt was able to walk to the bathroom this morning and has been up in the chair. Pt had a clear liquid breakfast and was able to tolerate well without any nausea.   ?

## 2021-12-16 NOTE — Progress Notes (Signed)
Notified md of ng tube being out.  Received order to leave out ng tube and start clear liquid diet.  ?

## 2021-12-16 NOTE — Progress Notes (Signed)
?  Subjective: ?Patient has had bowel movement and is passing flatus.  Tolerating clear liquid diet well this morning.  Her NG tube had fallen out earlier in the day.  Denies any nausea or vomiting. ? ?Objective: ?Vital signs in last 24 hours: ?Temp:  [98.7 ?F (37.1 ?C)-99.4 ?F (37.4 ?C)] 99 ?F (37.2 ?C) (04/17 [id number]) ?Pulse Rate:  [74-90] 90 (04/17 0508) ?Resp:  [17] 17 (04/17 0508) ?BP: (130-158)/(83-86) 130/84 (04/17 0508) ?SpO2:  [91 %-99 %] 99 % (04/17 0508) ?Last BM Date : 12/16/21 ? ?Intake/Output from previous day: ?04/16 0701 - 04/17 0700 ?In: 3262.4 [I.V.:3262.4] ?Out: 900 [Emesis/NG output:450] ?Intake/Output this shift: ?No intake/output data recorded. ? ?General appearance: alert, cooperative, and no distress ?GI: soft, non-tender; bowel sounds normal; no masses,  no organomegaly ? ?Lab Results:  ?Recent Labs  ?  12/14/21 ?[id number] 12/15/21 ?0413  ?WBC 5.7 3.4*  ?HGB 11.1* 10.5*  ?HCT 34.9* 32.6*  ?PLT 325 290  ? ?BMET ?Recent Labs  ?  12/15/21 ?0413 12/16/21 ?[id number]  ?NA 140 137  ?K 3.4* 3.0*  ?CL 109 104  ?CO2 24 23  ?GLUCOSE 111* 79  ?BUN 12 10  ?CREATININE 0.37* 0.37*  ?CALCIUM 8.6* 8.6*  ? ?PT/INR ?No results for input(s): LABPROT, INR in the last 72 hours. ? ?Studies/Results: ?DG Abd 1 View ? ?Result Date: 12/14/2021 ?CLINICAL DATA:  NG tube placement EXAM: ABDOMEN - 1 VIEW COMPARISON:  Study done earlier today FINDINGS: There is interval repositioning of enteric tube with its tip in the lateral aspect of fundus of the stomach. Dilated small bowel loops are noted measuring up to 4.8 cm in diameter. IMPRESSION: Tip of enteric tube is noted within fundus of the stomach. There is dilation of small-bowel loops suggesting small-bowel obstruction. Electronically Signed   By: Elmer Picker M.D.   On: 12/14/2021 09:51  ? ?DG Abd Portable 1V-Small Bowel Obstruction Protocol-initial, 8 hr delay ? ?Result Date: 12/15/2021 ?CLINICAL DATA:  Small bowel obstruction.  8 hour delay. EXAM: PORTABLE ABDOMEN - 1 VIEW  COMPARISON:  Abdominal x-ray from yesterday. FINDINGS: Unchanged enteric tube in the stomach. Oral contrast reaches the rectum. Multiple dilated loops of air-filled small bowel in the central abdomen and pelvis are unchanged. IMPRESSION: 1. Unchanged small bowel dilatation suggestive of partial obstruction, given oral contrast reaches the rectum. Electronically Signed   By: Titus Dubin M.D.   On: 12/15/2021 19:18   ? ?Anti-infectives: ?Anti-infectives (From admission, onward)  ? ? None  ? ?  ? ? ?Assessment/Plan: ?Impression: Partial small bowel obstruction, resolving.  No mechanical obstruction seen on small bowel obstruction protocol series.  No need for surgical intervention at this time.  Patient's diet has been advanced to a heart healthy diet.  Okay for discharge today from the surgery standpoint. ? LOS: 3 days  ? ? ?Aviva Signs ?12/16/2021  ?

## 2021-12-16 NOTE — Care Management Important Message (Signed)
Important Message ? ?Patient Details  ?Name: Tiffany Bradley ?MRN: [id number] ?Date of Birth: 1946-04-22 ? ? ?Medicare Important Message Given:  Yes ? ? ? ? ?Corey Harold ?12/16/2021, 1:00 PM ?

## 2021-12-16 NOTE — Progress Notes (Addendum)
Dr. Lovell Sheehan called to update staff on ct results.  MD advised to keep ng tube in, but clamped at this time. Patient has had a bowel movement at this time. Patient has had no nausea or vomiting this shift.  ?

## 2021-12-16 NOTE — Discharge Summary (Signed)
?Physician Discharge Summary ?  ?[name] MRN: [id number] DOB: [birth year]  ?Admit date:     12/13/2021  ?Discharge date: 12/16/21  ?Discharge Physician: Barton Dubois  ? ?PCP: Janora Norlander, DO  ? ?Recommendations at discharge:  ?Repeat CBC to follow hemoglobin and WBCs stability. ?Repeat basic metabolic panel to follow electrolytes and renal function. ?Reassess patient response to initiated bowel regimen and adjust laxative medications as needed. ?Close monitoring of patient CBGs and improvement in A1c; if that fails initiate therapy with metformin. ? ?Discharge Diagnoses: ?Principal Problem: ?  Small bowel obstruction ([place]) ?Active Problems: ?  Hyperglycemia ?  Essential hypertension ?  Primary osteoarthritis of both knees ?  GERD (gastroesophageal reflux disease) ?  Age-related osteoporosis without current pathological fracture ?  OAB (overactive bladder) ?Prediabetes (A1c 5.8) ? ?Brief hospital course: ?As per H&P written by Dr. Evangeline Gula on 12/13/2021 ?Tiffany Bradley is a 76 y.o. female with medical history significant of Essential hypertension, primary osteoarthritis of both knees, gastroesophageal reflux disease, age-related osteoporosis, overactive bladder who presents to the emergency department with complaints of abdominal pain for 3 days.  She has had some vomiting and nausea but no diarrhea.  She has not had a bowel movement in a few days and thought she was constipated.  She took some MiraLAX on Monday but felt bloated at that time and was not having the pain.  Last night she continued to vomit until about 5 AM but has not eaten anything in the past 2 to 3 days.  Patient had a prior abdominal surgery with a partial colectomy done in 2017.  Unsure as to why it was performed.  Patient had a follow-up colonoscopy in 2018.  She has had no episodes of hematemesis. ?  ?ED Course: Patient fully evaluated in the emergency department: Labs significant for glucose of 164, movement of 11.3 which is  improved from 10.7 a month ago.  Urinalysis is unremarkable.  Fecal occult blood is negative.  CT scan shows a high-grade small bowel obstruction with a transition point in the right lower abdomen and a swirling appearance of the small bowel concerning for internal hernia.  Case was discussed with general surgery who recommended to medicine service, NG tube and will follow-up with patient. ? ?Assessment and Plan: ?* Small bowel obstruction (HCC) ?-NG tube successfully removed and patient able to tolerate diet prior to discharge. ?-No complaining of abdominal pain. ?-Passing gas and had bowel movement. ?-Advised to maintain adequate hydration, to increase fiber intake and to use bowel regimen as prescribed (MiraLAX and Colace). ? ?Hyperglycemia ?-No prior history of diabetes ?-A1c 5.8 ?-Patient met criteria for prediabetes. ?-Will recommend aggressive Lifestyle changes, modify carbohydrate diet and close outpatient monitoring. ?-If patient failed to control blood glucose in the next 3 months we will recommend initiation of therapy with metformin. ? ?OAB (overactive bladder) ?-resume the use of Detrol ? ?Age-related osteoporosis without current pathological fracture ?-Continue outpatient follow-up with PCP ?-Recommending initiation of calcium vitamin D. ? ?GERD (gastroesophageal reflux disease) ?-Continue PPI. ? ?Primary osteoarthritis of both knees ?-Continue as needed analgesics. ? ? ?Essential hypertension ?-Stable overall ?-Continue following heart healthy diet ?-Resume home antihypertensive regimen. ? ? ? ?Consultants: General surgery ?Procedures performed: See below for x-ray reports. ?Disposition: Home ?Diet recommendation:  ?Discharge Diet Orders (From admission, onward)  ? ?  Start     Ordered  ? 12/16/21 0000  Diet - low sodium heart healthy       ? 12/16/21 1229  ? ?  ?  ? ?  ? ?  Cardiac and Carb modified diet ?DISCHARGE MEDICATION: ?Allergies as of 12/16/2021   ?No Known Allergies ?  ? ?  ?Medication List  ?   ? ?STOP taking these medications   ? ?dextromethorphan-guaiFENesin 30-600 MG 12hr tablet ?Commonly known as: [place] DM ?  ?sodium chloride 0.65 % Soln nasal spray ?Commonly known as: OCEAN ?  ?sucralfate 1 g tablet ?Commonly known as: CARAFATE ?  ? ?  ? ?TAKE these medications   ? ?amLODipine 10 MG tablet ?Commonly known as: NORVASC ?Take 1 tablet (10 mg total) by mouth daily. ?  ?aspirin 325 MG EC tablet ?Take 1 tablet (325 mg total) by mouth daily with breakfast. ?  ?atorvastatin 20 MG tablet ?Commonly known as: LIPITOR ?Take 1 tablet (20 mg total) by mouth daily. ?  ?CALCIUM 600+D3 PO ?Take 1 tablet by mouth daily. ?  ?diclofenac 75 MG EC tablet ?Commonly known as: VOLTAREN ?TAKE 1 TABLET BY MOUTH TWICE DAILY AS NEEDED FOR MODERATE PAIN ?What changed: See the new instructions. ?  ?docusate sodium 100 MG capsule ?Commonly known as: Colace ?Take 1 capsule (100 mg total) by mouth 2 (two) times daily. ?  ?ferrous sulfate 325 (65 FE) MG tablet ?Take 325 mg by mouth daily. ?  ?gabapentin 300 MG capsule ?Commonly known as: NEURONTIN ?Take 1 capsule (300 mg total) by mouth 2 (two) times daily. ?  ?GLUCOSAMINE-MSM PO ?Take 2 tablets by mouth daily. ?  ?mometasone 50 MCG/ACT nasal spray ?Commonly known as: Nasonex ?Place 2 sprays into the nose daily. ?  ?MULTI-VITAMIN GUMMIES PO ?Take 2 each by mouth daily. ?  ?omeprazole 40 MG capsule ?Commonly known as: PRILOSEC ?Take 1 capsule (40 mg total) by mouth in the morning and at bedtime. ?What changed: when to take this ?  ?polyethylene glycol 17 g packet ?Commonly known as: MiraLax ?Take 17 g by mouth daily. ?  ?tiZANidine 4 MG tablet ?Commonly known as: ZANAFLEX ?TAKE 1/2 TO 1 (ONE-HALF TO ONE) TABLET BY MOUTH EVERY 8 HOURS AS NEEDED FOR MUSCLE SPASM. USE SPARINGLY. ?What changed: See the new instructions. ?  ?tolterodine 2 MG 24 hr capsule ?Commonly known as: DETROL LA ?Take 1 capsule (2 mg total) by mouth daily. ?  ?traMADol 50 MG tablet ?Commonly known as: ULTRAM ?Take 1  tablet (50 mg total) by mouth every 12 (twelve) hours as needed for moderate pain or severe pain. ?  ? ?  ? ? Follow-up Information   ? ? Ronnie Doss M, DO. Schedule an appointment as soon as possible for a visit in 10 day(s).   ?Specialty: Family Medicine ?Contact information: ?[address] ?[place] [address] ?[phone] ? ? ?  ?  ? ?  ?  ? ?  ? ?Discharge Exam: ?Filed Weights  ? 12/13/21 1232 12/13/21 1807 12/14/21 0600  ?Weight: 78 kg 75 kg 76.1 kg  ? ?General exam: Alert, awake, oriented x 3; no abdominal pain, no nausea or vomiting.  Tolerating diet, passing gas and had bowel movement prior to discharge. ?Respiratory system: Clear to auscultation. Respiratory effort normal.  Good saturation on room air. ?Cardiovascular system:RRR. No murmurs, rubs, gallops. ?Gastrointestinal system: Abdomen is nondistended, soft and nontender. No organomegaly or masses felt. Normal bowel sounds heard. ?Central nervous system: Alert and oriented. No focal neurological deficits. ?Extremities: No cyanosis or clubbing.  Chronic unchanged osteoarthritis pain. ?Skin: No rashes, lesions or ulcers ?Psychiatry: Judgement and insight appear normal. Mood & affect appropriate.  ? ? ?Condition at discharge: Stable and improved. ? ?  The results of significant diagnostics from this hospitalization (including imaging, microbiology, ancillary and laboratory) are listed below for reference.  ? ?Imaging Studies: ?DG Abd 1 View ? ?Result Date: 12/14/2021 ?CLINICAL DATA:  NG tube placement EXAM: ABDOMEN - 1 VIEW COMPARISON:  Study done earlier today FINDINGS: There is interval repositioning of enteric tube with its tip in the lateral aspect of fundus of the stomach. Dilated small bowel loops are noted measuring up to 4.8 cm in diameter. IMPRESSION: Tip of enteric tube is noted within fundus of the stomach. There is dilation of small-bowel loops suggesting small-bowel obstruction. Electronically Signed   By: Elmer Picker M.D.   On:  12/14/2021 09:51  ? ?DG Abdomen 1 View ? ?Result Date: 12/13/2021 ?CLINICAL DATA:  NG tube placement. EXAM: ABDOMEN - 1 VIEW COMPARISON:  Abdominopelvic CT earlier today FINDINGS: Tip and side port of the e

## 2021-12-16 NOTE — Progress Notes (Signed)
Nsg Discharge Note ? ?Admit Date:  12/13/2021 ?Discharge date: 12/16/2021 ?  ?Kerman Passey to be D/C'd Home per MD order.  AVS completed.  Copy for chart, and copy for patient signed, and dated. ?Patient/caregiver able to verbalize understanding. ? ?Discharge Medication: ?Allergies as of 12/16/2021   ?No Known Allergies ?  ? ?  ?Medication List  ?  ? ?STOP taking these medications   ? ?dextromethorphan-guaiFENesin 30-600 MG 12hr tablet ?Commonly known as: [place] DM ?  ?sodium chloride 0.65 % Soln nasal spray ?Commonly known as: OCEAN ?  ?sucralfate 1 g tablet ?Commonly known as: CARAFATE ?  ? ?  ? ?TAKE these medications   ? ?amLODipine 10 MG tablet ?Commonly known as: NORVASC ?Take 1 tablet (10 mg total) by mouth daily. ?  ?aspirin 325 MG EC tablet ?Take 1 tablet (325 mg total) by mouth daily with breakfast. ?  ?atorvastatin 20 MG tablet ?Commonly known as: LIPITOR ?Take 1 tablet (20 mg total) by mouth daily. ?  ?CALCIUM 600+D3 PO ?Take 1 tablet by mouth daily. ?  ?diclofenac 75 MG EC tablet ?Commonly known as: VOLTAREN ?TAKE 1 TABLET BY MOUTH TWICE DAILY AS NEEDED FOR MODERATE PAIN ?What changed: See the new instructions. ?  ?docusate sodium 100 MG capsule ?Commonly known as: Colace ?Take 1 capsule (100 mg total) by mouth 2 (two) times daily. ?  ?ferrous sulfate 325 (65 FE) MG tablet ?Take 325 mg by mouth daily. ?  ?gabapentin 300 MG capsule ?Commonly known as: NEURONTIN ?Take 1 capsule (300 mg total) by mouth 2 (two) times daily. ?  ?GLUCOSAMINE-MSM PO ?Take 2 tablets by mouth daily. ?  ?mometasone 50 MCG/ACT nasal spray ?Commonly known as: Nasonex ?Place 2 sprays into the nose daily. ?  ?MULTI-VITAMIN GUMMIES PO ?Take 2 each by mouth daily. ?  ?omeprazole 40 MG capsule ?Commonly known as: PRILOSEC ?Take 1 capsule (40 mg total) by mouth in the morning and at bedtime. ?What changed: when to take this ?  ?polyethylene glycol 17 g packet ?Commonly known as: MiraLax ?Take 17 g by mouth daily. ?  ?tiZANidine 4 MG  tablet ?Commonly known as: ZANAFLEX ?TAKE 1/2 TO 1 (ONE-HALF TO ONE) TABLET BY MOUTH EVERY 8 HOURS AS NEEDED FOR MUSCLE SPASM. USE SPARINGLY. ?What changed: See the new instructions. ?  ?tolterodine 2 MG 24 hr capsule ?Commonly known as: DETROL LA ?Take 1 capsule (2 mg total) by mouth daily. ?  ?traMADol 50 MG tablet ?Commonly known as: ULTRAM ?Take 1 tablet (50 mg total) by mouth every 12 (twelve) hours as needed for moderate pain or severe pain. ?  ? ?  ? ? ?Discharge Assessment: ?Vitals:  ? 12/16/21 0508 12/16/21 1326  ?BP: 130/84 137/80  ?Pulse: 90 87  ?Resp: 17 19  ?Temp: 99 ?F (37.2 ?C) (!) 97.5 ?F (36.4 ?C)  ?SpO2: 99% 100%  ? Skin clean, dry and intact without evidence of skin break down, no evidence of skin tears noted. ?IV catheter discontinued intact. Site without signs and symptoms of complications - no redness or edema noted at insertion site, patient denies c/o pain - only slight tenderness at site.  Dressing with slight pressure applied. ? ?D/c Instructions-Education: ?Discharge instructions given to patient/family with verbalized understanding. ?D/c education completed with patient/family including follow up instructions, medication list, d/c activities limitations if indicated, with other d/c instructions as indicated by MD - patient able to verbalize understanding, all questions fully answered. ?Patient instructed to return to ED, call 911, or call MD  for any changes in condition.  ?Patient escorted via [place], and D/C home via private auto. ? ?Clovis Fredrickson, LPN ?[id number]  ?

## 2021-12-18 ENCOUNTER — Telehealth: Payer: Self-pay

## 2021-12-18 NOTE — Telephone Encounter (Signed)
Transition Care Management Follow-up Telephone Call ?Date of discharge and from where: 12/16/21 [facility] ?How have you been since you were released from the hospital? Better, just no appetite, but making herself eat a little and is drinking plenty - still having lots of bowel movements ?Any questions or concerns? No ? ?Items Reviewed: ?Did the pt receive and understand the discharge instructions provided? Yes  ?Medications obtained and verified? Yes  ?Other? No  ?Any new allergies since your discharge? No  ?Dietary orders reviewed? Yes ?Do you have support at home? Yes  ? ?Home Care and Equipment/Supplies: ?Were home health services ordered? no ? ?Were any new equipment or medical supplies ordered?  No ? ?Functional Questionnaire: (I = Independent and D = Dependent) ?ADLs: I ? ?Bathing/Dressing- I ? ?Meal Prep- I ? ?Eating- I ? ?Maintaining continence- I ? ?Transferring/Ambulation- I ? ?Managing Meds- I ? ?Follow up appointments reviewed: ? ?PCP Hospital f/u appt confirmed? Yes  Scheduled to see [facility] on 12/25/21 @ 3. ?[facility] f/u appt confirmed? No   ?Are transportation arrangements needed? No  ?If their condition worsens, is the pt aware to call PCP or go to the Emergency Dept.? Yes ?Was the patient provided with contact information for the PCP's office or ED? Yes ?Was to pt encouraged to call back with questions or concerns? Yes  ?

## 2021-12-23 ENCOUNTER — Ambulatory Visit (INDEPENDENT_AMBULATORY_CARE_PROVIDER_SITE_OTHER): Payer: Medicare HMO

## 2021-12-23 ENCOUNTER — Ambulatory Visit: Payer: Medicare HMO | Admitting: Family Medicine

## 2021-12-23 ENCOUNTER — Ambulatory Visit
Admission: RE | Admit: 2021-12-23 | Discharge: 2021-12-23 | Disposition: A | Discharge status: Home / Self Care | Payer: Medicare HMO | Source: Ambulatory Visit | Attending: Family Medicine | Admitting: Family Medicine

## 2021-12-23 DIAGNOSIS — Z780 Asymptomatic menopausal state: Secondary | ICD-10-CM | POA: Diagnosis not present

## 2021-12-23 DIAGNOSIS — M810 Age-related osteoporosis without current pathological fracture: Secondary | ICD-10-CM

## 2021-12-23 DIAGNOSIS — M8589 Other specified disorders of bone density and structure, multiple sites: Secondary | ICD-10-CM | POA: Diagnosis not present

## 2021-12-23 DIAGNOSIS — Z1231 Encounter for screening mammogram for malignant neoplasm of breast: Secondary | ICD-10-CM

## 2021-12-24 ENCOUNTER — Encounter: Payer: Self-pay | Admitting: Emergency Medicine

## 2021-12-25 ENCOUNTER — Encounter: Payer: Self-pay | Admitting: Family Medicine

## 2021-12-25 ENCOUNTER — Ambulatory Visit (INDEPENDENT_AMBULATORY_CARE_PROVIDER_SITE_OTHER): Payer: Medicare HMO | Admitting: Family Medicine

## 2021-12-25 VITALS — BP 113/72 | HR 83 | Temp 98.2°F | Ht 67.0 in | Wt 166.2 lb

## 2021-12-25 DIAGNOSIS — Z09 Encounter for follow-up examination after completed treatment for conditions other than malignant neoplasm: Secondary | ICD-10-CM | POA: Diagnosis not present

## 2021-12-25 DIAGNOSIS — M8588 Other specified disorders of bone density and structure, other site: Secondary | ICD-10-CM | POA: Diagnosis not present

## 2021-12-25 DIAGNOSIS — E876 Hypokalemia: Secondary | ICD-10-CM

## 2021-12-25 DIAGNOSIS — D649 Anemia, unspecified: Secondary | ICD-10-CM | POA: Diagnosis not present

## 2021-12-25 DIAGNOSIS — K56609 Unspecified intestinal obstruction, unspecified as to partial versus complete obstruction: Secondary | ICD-10-CM | POA: Diagnosis not present

## 2021-12-25 NOTE — Progress Notes (Signed)
? ?Subjective: ?CC: Hospital follow-up for SBO ?PCP: Janora Norlander, DO ?HPI:Tiffany Bradley is a 76 y.o. female presenting to clinic today for: ? ?1.  Small bowel obstruction ?Patient was admitted to the hospital for small bowel obstruction on 12/13/2021 and discharged 12/16/2021.  CT abdomen showed high-grade small bowel obstruction with a transition point in the right lower abdomen.  She was treated with NG tube/decompression.  No surgical interventions were required.  She was able to transition over to p.o. intake prior to discharge.  She was advised to maintain adequate hydration, increase fiber intake and use something like MiraLAX and Colace to maintain bowel regimen. ? ?She has been compliant with this regimen and reports that her appetite is picking back up.  She was able to start tasting food again yesterday and her appetite has been pretty ravenous.  She reports normal bowel movements.  No blood in stool.  No abdominal pain.  No nausea, vomiting.  She denies any cramping in the legs.  She has not felt especially weak or fatigued but admits that she does not feel as strong as she had been prior to hospitalization.  She is been try to get up and around so that she can build her strength back up ? ?2. Osteopenia ?Lumbar spine -2.4.  Wanted to review recent DEXA.  Does not sound like she is currently on a vitamin D and calcium supplement. ? ? ?ROS: Per HPI ? ?No Known Allergies ?Past Medical History:  ?Diagnosis Date  ? Arthritis   ? Back pain   ? Cataract   ? DJD (degenerative joint disease)   ? Full dentures   ? GERD (gastroesophageal reflux disease)   ? Glaucoma   ? History of bleeding ulcers   ? patient reports multiple hospitalizations remotely  ? Hypertension   ? Wears glasses   ? ? ?Current Outpatient Medications:  ?  amLODipine (NORVASC) 10 MG tablet, Take 1 tablet (10 mg total) by mouth daily., Disp: 90 tablet, Rfl: 3 ?  aspirin EC 325 MG EC tablet, Take 1 tablet (325 mg total) by mouth daily with  breakfast., Disp: 30 tablet, Rfl: 0 ?  atorvastatin (LIPITOR) 20 MG tablet, Take 1 tablet (20 mg total) by mouth daily., Disp: 90 tablet, Rfl: 3 ?  Calcium Carb-Cholecalciferol (CALCIUM 600+D3 PO), Take 1 tablet by mouth daily., Disp: , Rfl:  ?  diclofenac (VOLTAREN) 75 MG EC tablet, TAKE 1 TABLET BY MOUTH TWICE DAILY AS NEEDED FOR MODERATE PAIN (Patient taking differently: Take 75 mg by mouth 2 (two) times daily as needed for moderate pain.), Disp: 180 tablet, Rfl: 0 ?  docusate sodium (COLACE) 100 MG capsule, Take 1 capsule (100 mg total) by mouth 2 (two) times daily., Disp: 60 capsule, Rfl: 2 ?  ferrous sulfate 325 (65 FE) MG tablet, Take 325 mg by mouth daily., Disp: , Rfl:  ?  gabapentin (NEURONTIN) 300 MG capsule, Take 1 capsule (300 mg total) by mouth 2 (two) times daily., Disp: 180 capsule, Rfl: 3 ?  Glucosamine HCl-MSM (GLUCOSAMINE-MSM PO), Take 2 tablets by mouth daily., Disp: , Rfl:  ?  mometasone (NASONEX) 50 MCG/ACT nasal spray, Place 2 sprays into the nose daily., Disp: 1 each, Rfl: 12 ?  Multiple Vitamins-Minerals (MULTI-VITAMIN GUMMIES PO), Take 2 each by mouth daily. , Disp: , Rfl:  ?  omeprazole (PRILOSEC) 40 MG capsule, Take 1 capsule (40 mg total) by mouth in the morning and at bedtime., Disp: 60 capsule, Rfl: 3 ?  polyethylene glycol (MIRALAX) 17 g packet, Take 17 g by mouth daily., Disp: 28 each, Rfl: 2 ?  tiZANidine (ZANAFLEX) 4 MG tablet, TAKE 1/2 TO 1 (ONE-HALF TO ONE) TABLET BY MOUTH EVERY 8 HOURS AS NEEDED FOR MUSCLE SPASM. USE SPARINGLY. (Patient taking differently: Take 4 mg by mouth at bedtime.), Disp: 60 tablet, Rfl: 0 ?  tolterodine (DETROL LA) 2 MG 24 hr capsule, Take 1 capsule (2 mg total) by mouth daily., Disp: 90 capsule, Rfl: 3 ?  traMADol (ULTRAM) 50 MG tablet, Take 1 tablet (50 mg total) by mouth every 12 (twelve) hours as needed for moderate pain or severe pain., Disp: 60 tablet, Rfl: 2 ?Social History  ? ?Socioeconomic History  ? Marital status: Divorced  ?  Spouse name: Not  on file  ? Number of children: 3  ? Years of education: 3  ? Highest education level: Not on file  ?Occupational History  ? Occupation: Retired  ?  Employer: [employer]  ?Tobacco Use  ? Smoking status: Former  ?  Packs/day: 3.00  ?  Types: Cigarettes  ?  Quit date: 04/20/1993  ?  Years since quitting: 28.7  ? Smokeless tobacco: Never  ?Vaping Use  ? Vaping Use: Never used  ?Substance and Sexual Activity  ? Alcohol use: Not Currently  ? Drug use: No  ? Sexual activity: Not Currently  ?Other Topics Concern  ? Not on file  ?Social History Narrative  ? Tiffany Bradley is retired and  her 30 year old granddaughter lives with her. She has two daughters and one son, all local that she sees regularly. She is active in church and sings in the choir. She enjoys crafting, yard work, and shopping.   ? ?Social Determinants of Health  ? ?Financial Resource Strain: Low Risk   ? Difficulty of Paying Living Expenses: Not hard at all  ?Food Insecurity: No Food Insecurity  ? Worried About Charity fundraiser in the Last Year: Never true  ? Ran Out of Food in the Last Year: Never true  ?Transportation Needs: No Transportation Needs  ? Lack of Transportation (Medical): No  ? Lack of Transportation (Non-Medical): No  ?Physical Activity: Sufficiently Active  ? Days of Exercise per Week: 7 days  ? Minutes of Exercise per Session: 30 min  ?Stress: No Stress Concern Present  ? Feeling of Stress : Not at all  ?Social Connections: Moderately Integrated  ? Frequency of Communication with Friends and Family: More than three times a week  ? Frequency of Social Gatherings with Friends and Family: More than three times a week  ? Attends Religious Services: More than 4 times per year  ? Active Member of Clubs or Organizations: Yes  ? Attends Archivist Meetings: More than 4 times per year  ? Marital Status: Divorced  ?Intimate Partner Violence: Not At Risk  ? Fear of Current or Ex-Partner: No  ? Emotionally Abused: No  ? Physically Abused: No  ?  Sexually Abused: No  ? ?Family History  ?Problem Relation Age of Onset  ? Kidney disease Mother   ? Stroke Mother   ? Heart attack Father   ? Hyperlipidemia Sister   ? Hypertension Sister   ? Diabetes Sister   ? Hypertension Sister   ? Hyperlipidemia Sister   ? Hypertension Brother   ? Hyperlipidemia Brother   ? Colon cancer Neg Hx   ? Breast cancer Neg Hx   ? ? ?Objective: ?Office vital signs reviewed. ?BP 113/72  Pulse 83   Temp 98.2 ?F (36.8 ?C)   Ht 5\' 7"  (1.702 m)   Wt 166 lb 3.2 oz (75.4 kg)   SpO2 95%   BMI 26.03 kg/m?  ? ?Physical Examination:  ?General: Awake, alert, well nourished, No acute distress ?HEENT: Sclera white.  Moist mucous membranes ?Cardio: regular rate and rhythm, S1S2 heard, no murmurs appreciated ?Pulm: clear to auscultation bilaterally, no wheezes, rhonchi or rales; normal work of breathing on room air ?GI: Nondistended, soft, nontender ?Extremities: warm, well perfused, No edema, cyanosis or clubbing; +2 pulses bilaterally ?MSK: Ambulating independently ? ?Assessment/ Plan: ?76 y.o. female  ? ?Small bowel obstruction ([place]) - Plan: CBC with Differential, Basic Metabolic Panel ? ?Hospital discharge follow-up ? ?Hypokalemia - Plan: Basic Metabolic Panel ? ?Anemia, unspecified type - Plan: CBC with Differential ? ?Osteopenia of lumbar spine ? ?SBO resolved.  Tolerating p.o. intake without difficulty.  We will follow-up on potassium and hemoglobin levels ? ?We discussed the most recent DEXA scan and recommendations.  I have given her handouts on weightbearing exercise, dietary sources of vitamin D and calcium and overall daily intake recommendations.  We will plan to repeat this scan again in 2 years ? ?No orders of the defined types were placed in this encounter. ? ?No orders of the defined types were placed in this encounter. ? ? ?Today's visit is for Transitional Care Management. ? ?The patient was discharged from [facility] on 12/16/2021 with a primary diagnosis of SBO.  ? ?Contact  with the patient and/or caregiver, by a clinical staff member, was made on 12/18/2021 and was documented as a telephone encounter within the EMR. ? ?Through chart review and discussion with the patient I have de

## 2021-12-26 LAB — CBC WITH DIFFERENTIAL/PLATELET
Basophils Absolute: 0.1 10*3/uL (ref 0.0–0.2)
Basos: 1 %
EOS (ABSOLUTE): 0.3 10*3/uL (ref 0.0–0.4)
Eos: 3 %
Hematocrit: 30.1 % — ABNORMAL LOW (ref 34.0–46.6)
Hemoglobin: 9.4 g/dL — ABNORMAL LOW (ref 11.1–15.9)
Immature Grans (Abs): 0.1 10*3/uL (ref 0.0–0.1)
Immature Granulocytes: 1 %
Lymphocytes Absolute: 1.8 10*3/uL (ref 0.7–3.1)
Lymphs: 22 %
MCH: 25.3 pg — ABNORMAL LOW (ref 26.6–33.0)
MCHC: 31.2 g/dL — ABNORMAL LOW (ref 31.5–35.7)
MCV: 81 fL (ref 79–97)
Monocytes Absolute: 0.5 10*3/uL (ref 0.1–0.9)
Monocytes: 6 %
Neutrophils Absolute: 5.5 10*3/uL (ref 1.4–7.0)
Neutrophils: 67 %
Platelets: 467 10*3/uL — ABNORMAL HIGH (ref 150–450)
RBC: 3.71 x10E6/uL — ABNORMAL LOW (ref 3.77–5.28)
RDW: 16.3 % — ABNORMAL HIGH (ref 11.7–15.4)
WBC: 8.2 10*3/uL (ref 3.4–10.8)

## 2021-12-26 LAB — BASIC METABOLIC PANEL
BUN/Creatinine Ratio: 16 (ref 12–28)
BUN: 11 mg/dL (ref 8–27)
CO2: 26 mmol/L (ref 20–29)
Calcium: 9.2 mg/dL (ref 8.7–10.3)
Chloride: 102 mmol/L (ref 96–106)
Creatinine, Ser: 0.69 mg/dL (ref 0.57–1.00)
Glucose: 95 mg/dL (ref 70–99)
Potassium: 4.4 mmol/L (ref 3.5–5.2)
Sodium: 143 mmol/L (ref 134–144)
eGFR: 91 mL/min/{1.73_m2} (ref >59)

## 2021-12-26 NOTE — Addendum Note (Signed)
Addended by: Fara Olden on: 12/26/2021 01:54 PM ? ? Modules accepted: Orders ? ?

## 2022-01-01 ENCOUNTER — Other Ambulatory Visit: Payer: Medicare HMO

## 2022-01-01 DIAGNOSIS — D649 Anemia, unspecified: Secondary | ICD-10-CM

## 2022-01-02 LAB — CBC WITH DIFFERENTIAL/PLATELET
Basophils Absolute: 0.1 10*3/uL (ref 0.0–0.2)
Basos: 2 %
EOS (ABSOLUTE): 0.2 10*3/uL (ref 0.0–0.4)
Eos: 3 %
Hematocrit: 31.7 % — ABNORMAL LOW (ref 34.0–46.6)
Hemoglobin: 10.1 g/dL — ABNORMAL LOW (ref 11.1–15.9)
Immature Grans (Abs): 0 10*3/uL (ref 0.0–0.1)
Immature Granulocytes: 0 %
Lymphocytes Absolute: 1.5 10*3/uL (ref 0.7–3.1)
Lymphs: 26 %
MCH: 26.2 pg — ABNORMAL LOW (ref 26.6–33.0)
MCHC: 31.9 g/dL (ref 31.5–35.7)
MCV: 82 fL (ref 79–97)
Monocytes Absolute: 0.4 10*3/uL (ref 0.1–0.9)
Monocytes: 7 %
Neutrophils Absolute: 3.6 10*3/uL (ref 1.4–7.0)
Neutrophils: 62 %
Platelets: 370 10*3/uL (ref 150–450)
RBC: 3.85 x10E6/uL (ref 3.77–5.28)
RDW: 16.1 % — ABNORMAL HIGH (ref 11.7–15.4)
WBC: 5.8 10*3/uL (ref 3.4–10.8)

## 2022-01-02 LAB — IRON,TIBC AND FERRITIN PANEL
Ferritin: 54 ng/mL (ref 15–150)
Iron Saturation: 30 % (ref 15–55)
Iron: 120 ug/dL (ref 27–139)
Total Iron Binding Capacity: 396 ug/dL (ref 250–450)
UIBC: 276 ug/dL (ref 118–369)

## 2022-01-09 ENCOUNTER — Other Ambulatory Visit: Payer: Self-pay | Admitting: Family Medicine

## 2022-01-09 DIAGNOSIS — M170 Bilateral primary osteoarthritis of knee: Secondary | ICD-10-CM

## 2022-01-13 ENCOUNTER — Other Ambulatory Visit: Payer: Self-pay

## 2022-02-11 ENCOUNTER — Other Ambulatory Visit: Payer: Self-pay | Admitting: Physician Assistant

## 2022-02-11 DIAGNOSIS — M1612 Unilateral primary osteoarthritis, left hip: Secondary | ICD-10-CM

## 2022-02-17 NOTE — Patient Instructions (Signed)
DUE TO COVID-19 ONLY TWO VISITORS  (aged 75 and older)  ARE ALLOWED TO COME WITH YOU AND STAY IN THE WAITING ROOM ONLY DURING PRE OP AND PROCEDURE.   **NO VISITORS ARE ALLOWED IN THE SHORT STAY AREA OR RECOVERY ROOM!!**  IF YOU WILL BE ADMITTED INTO THE HOSPITAL YOU ARE ALLOWED ONLY FOUR SUPPORT PEOPLE DURING VISITATION HOURS ONLY (7 AM -8PM)   The support person(s) must pass our screening, gel in and out, and wear a mask at all times, including in the patient's room. Patients must also wear a mask when staff or their support person are in the room. Visitors GUEST BADGE MUST BE WORN VISIBLY  One adult visitor may remain with you overnight and MUST be in the room by 8 P.M.     Your procedure is scheduled on: 02/28/22   Report to [facility] Main Entrance    Report to admitting at : 9:20 AM   Call this number if you have problems the morning of surgery [phone]   Do not eat food :After Midnight.   After Midnight you may have the following liquids until: 8:50 AM DAY OF SURGERY  Water Black Coffee (sugar ok, NO MILK/CREAM OR CREAMERS)  Tea (sugar ok, NO MILK/CREAM OR CREAMERS) regular and decaf                             Plain Jell-O (NO RED)                                           Fruit ices (not with fruit pulp, NO RED)                                     Popsicles (NO RED)                                                                  Juice: apple, WHITE grape, WHITE cranberry Sports drinks like Gatorade (NO RED) Clear broth(vegetable,chicken,beef)              Drink G2 drink AT: 8:50 AM the day of surgery.      The day of surgery:  Drink ONE (1) Pre-Surgery Clear Ensure or G2 at AM the morning of surgery. Drink in one sitting. Do not sip.  This drink was given to you during your hospital  pre-op appointment visit. Nothing else to drink after completing the  Pre-Surgery Clear Ensure or G2.          If you have questions, please contact your surgeon's  office.   Oral Hygiene is also important to reduce your risk of infection.                                    Remember - BRUSH YOUR TEETH THE MORNING OF SURGERY WITH YOUR REGULAR TOOTHPASTE   Do NOT smoke after Midnight   Take these medicines the morning of surgery with A  SIP OF WATER: gabapentin,amlodipine,omeprazole,tolterodine  DO NOT TAKE ANY ORAL DIABETIC MEDICATIONS DAY OF YOUR SURGERY  Bring CPAP mask and tubing day of surgery.                              You may not have any metal on your body including hair pins, jewelry, and body piercing             Do not wear make-up, lotions, powders, perfumes/cologne, or deodorant  Do not wear nail polish including gel and S&S, artificial/acrylic nails, or any other type of covering on natural nails including finger and toenails. If you have artificial nails, gel coating, etc. that needs to be removed by a nail salon please have this removed prior to surgery or surgery may need to be canceled/ delayed if the surgeon/ anesthesia feels like they are unable to be safely monitored.   Do not shave  48 hours prior to surgery.               Men may shave face and neck.   Do not bring valuables to the hospital. [place].   Contacts, dentures or bridgework may not be worn into surgery.   Bring small overnight bag day of surgery.   DO NOT [place]. PHARMACY WILL DISPENSE MEDICATIONS LISTED ON YOUR MEDICATION LIST TO YOU DURING YOUR ADMISSION [place]!    Patients discharged on the day of surgery will not be allowed to drive home.  Someone NEEDS to stay with you for the first 24 hours after anesthesia.   Special Instructions: Bring a copy of your healthcare power of attorney and living will documents         the day of surgery if you haven't scanned them before.              Please read over the following fact sheets you were given: IF YOU HAVE  QUESTIONS ABOUT YOUR PRE-OP INSTRUCTIONS PLEASE CALL [phone]     [facility] - Preparing for Surgery Before surgery, you can play an important role.  Because skin is not sterile, your skin needs to be as free of germs as possible.  You can reduce the number of germs on your skin by washing with CHG (chlorahexidine gluconate) soap before surgery.  CHG is an antiseptic cleaner which kills germs and bonds with the skin to continue killing germs even after washing. Please DO NOT use if you have an allergy to CHG or antibacterial soaps.  If your skin becomes reddened/irritated stop using the CHG and inform your nurse when you arrive at Short Stay. Do not shave (including legs and underarms) for at least 48 hours prior to the first CHG shower.  You may shave your face/neck. Please follow these instructions carefully:  1.  Shower with CHG Soap the night before surgery and the  morning of Surgery.  2.  If you choose to wash your hair, wash your hair first as usual with your  normal  shampoo.  3.  After you shampoo, rinse your hair and body thoroughly to remove the  shampoo.                           4.  Use CHG as you would  any other liquid soap.  You can apply chg directly  to the skin and wash                       Gently with a scrungie or clean washcloth.  5.  Apply the CHG Soap to your body ONLY FROM THE NECK DOWN.   Do not use on face/ open                           Wound or open sores. Avoid contact with eyes, ears mouth and genitals (private parts).                       Wash face,  Genitals (private parts) with your normal soap.             6.  Wash thoroughly, paying special attention to the area where your surgery  will be performed.  7.  Thoroughly rinse your body with warm water from the neck down.  8.  DO NOT shower/wash with your normal soap after using and rinsing off  the CHG Soap.                9.  Pat yourself dry with a clean towel.            10.  Wear clean pajamas.             11.  Place clean sheets on your bed the night of your first shower and do not  sleep with pets. Day of Surgery : Do not apply any lotions/deodorants the morning of surgery.  Please wear clean clothes to the hospital/surgery center.  FAILURE TO FOLLOW THESE INSTRUCTIONS MAY RESULT IN THE CANCELLATION OF YOUR SURGERY PATIENT SIGNATURE_________________________________  NURSE SIGNATURE__________________________________  ________________________________________________________________________   Tiffany Bradley  An incentive spirometer is a tool that can help keep your lungs clear and active. This tool measures how well you are filling your lungs with each breath. Taking long deep breaths may help reverse or decrease the chance of developing breathing (pulmonary) problems (especially infection) following: A long period of time when you are unable to move or be active. BEFORE THE PROCEDURE  If the spirometer includes an indicator to show your best effort, your nurse or respiratory therapist will set it to a desired goal. If possible, sit up straight or lean slightly forward. Try not to slouch. Hold the incentive spirometer in an upright position. INSTRUCTIONS FOR USE  Sit on the edge of your bed if possible, or sit up as far as you can in bed or on a chair. Hold the incentive spirometer in an upright position. Breathe out normally. Place the mouthpiece in your mouth and seal your lips tightly around it. Breathe in slowly and as deeply as possible, raising the piston or the ball toward the top of the column. Hold your breath for 3-5 seconds or for as long as possible. Allow the piston or ball to fall to the bottom of the column. Remove the mouthpiece from your mouth and breathe out normally. Rest for a few seconds and repeat Steps 1 through 7 at least 10 times every 1-2 hours when you are awake. Take your time and take a few normal breaths between deep breaths. The spirometer may include an  indicator to show your best effort. Use the indicator as a goal to work toward during each repetition. After each set of  10 deep breaths, practice coughing to be sure your lungs are clear. If you have an incision (the cut made at the time of surgery), support your incision when coughing by placing a pillow or rolled up towels firmly against it. Once you are able to get out of bed, walk around indoors and cough well. You may stop using the incentive spirometer when instructed by your caregiver.  RISKS AND COMPLICATIONS Take your time so you do not get dizzy or light-headed. If you are in pain, you may need to take or ask for pain medication before doing incentive spirometry. It is harder to take a deep breath if you are having pain. AFTER USE Rest and breathe slowly and easily. It can be helpful to keep track of a log of your progress. Your caregiver can provide you with a simple table to help with this. If you are using the spirometer at home, follow these instructions: SEEK MEDICAL CARE IF:  You are having difficultly using the spirometer. You have trouble using the spirometer as often as instructed. Your pain medication is not giving enough relief while using the spirometer. You develop fever of 100.5 F (38.1 C) or higher. SEEK IMMEDIATE MEDICAL CARE IF:  You cough up bloody sputum that had not been present before. You develop fever of 102 F (38.9 C) or greater. You develop worsening pain at or near the incision site. MAKE SURE YOU:  Understand these instructions. Will watch your condition. Will get help right away if you are not doing well or get worse. Document Released: 12/29/2006 Document Revised: 11/10/2011 Document Reviewed: 03/01/2007 [facility] Patient Information 2014 [place], [place].   ________________________________________________________________________

## 2022-02-18 ENCOUNTER — Encounter (HOSPITAL_COMMUNITY): Payer: Self-pay

## 2022-02-18 ENCOUNTER — Other Ambulatory Visit: Payer: Self-pay

## 2022-02-18 ENCOUNTER — Encounter (HOSPITAL_COMMUNITY)
Admission: RE | Admit: 2022-02-18 | Discharge: 2022-02-18 | Disposition: A | Discharge status: Home / Self Care | Payer: Medicare HMO | Source: Ambulatory Visit | Attending: Orthopaedic Surgery | Admitting: Orthopaedic Surgery

## 2022-02-18 VITALS — BP 150/93 | HR 85 | Temp 98.4°F | Resp 18 | Ht 65.0 in | Wt 163.0 lb

## 2022-02-18 DIAGNOSIS — I10 Essential (primary) hypertension: Secondary | ICD-10-CM | POA: Insufficient documentation

## 2022-02-18 DIAGNOSIS — R739 Hyperglycemia, unspecified: Secondary | ICD-10-CM | POA: Insufficient documentation

## 2022-02-18 DIAGNOSIS — M1612 Unilateral primary osteoarthritis, left hip: Secondary | ICD-10-CM

## 2022-02-18 DIAGNOSIS — Z01818 Encounter for other preprocedural examination: Secondary | ICD-10-CM

## 2022-02-18 DIAGNOSIS — Z01812 Encounter for preprocedural laboratory examination: Secondary | ICD-10-CM | POA: Diagnosis present

## 2022-02-18 HISTORY — DX: Prediabetes: R73.03

## 2022-02-18 LAB — CBC
HCT: 34.9 % — ABNORMAL LOW (ref 36.0–46.0)
Hemoglobin: 10.9 g/dL — ABNORMAL LOW (ref 12.0–15.0)
MCH: 26 pg (ref 26.0–34.0)
MCHC: 31.2 g/dL (ref 30.0–36.0)
MCV: 83.3 fL (ref 80.0–100.0)
Platelets: 277 10*3/uL (ref 150–400)
RBC: 4.19 MIL/uL (ref 3.87–5.11)
RDW: 16.7 % — ABNORMAL HIGH (ref 11.5–15.5)
WBC: 4.4 10*3/uL (ref 4.0–10.5)
nRBC: 0 % (ref 0.0–0.2)

## 2022-02-18 LAB — BASIC METABOLIC PANEL
Anion gap: 7 (ref 5–15)
BUN: 22 mg/dL (ref 8–23)
CO2: 27 mmol/L (ref 22–32)
Calcium: 9.4 mg/dL (ref 8.9–10.3)
Chloride: 108 mmol/L (ref 98–111)
Creatinine, Ser: 0.62 mg/dL (ref 0.44–1.00)
GFR, Estimated: >60 mL/min (ref >60)
Glucose, Bld: 100 mg/dL — ABNORMAL HIGH (ref 70–99)
Potassium: 4.7 mmol/L (ref 3.5–5.1)
Sodium: 142 mmol/L (ref 135–145)

## 2022-02-18 LAB — SURGICAL PCR SCREEN
MRSA, PCR: NEGATIVE
Staphylococcus aureus: NEGATIVE

## 2022-02-18 LAB — GLUCOSE, CAPILLARY: Glucose-Capillary: 94 mg/dL (ref 70–99)

## 2022-02-18 LAB — HEMOGLOBIN A1C
Hgb A1c MFr Bld: 5.3 % (ref 4.8–5.6)
Mean Plasma Glucose: 105.41 mg/dL

## 2022-02-18 NOTE — Progress Notes (Signed)
For Short Stay: COVID SWAB appointment date: Date of COVID positive in last 90 days:  Bowel Prep reminder:   For Anesthesia: PCP - DO: Ashly Gottschalk. LOV: 12/25/21 Cardiologist -   Chest x-ray -  EKG - 11/13/21 Stress Test -  ECHO -  Cardiac Cath -  Pacemaker/ICD device last checked: Pacemaker orders received: Device Rep notified:  Spinal Cord Stimulator:  Sleep Study -  CPAP -   Fasting Blood Sugar - N?S Checks Blood Sugar __0___ times a day Date and result of last Hgb A1c- 5.8: 12/14/21  Blood Thinner Instructions: Aspirin Instructions: Pt. Said she will stop taking aspirin 325 mg,5 a week before surgery. Last Dose:  Activity level: Can go up a flight of stairs and activities of daily living without stopping and without chest pain and/or shortness of breath   Able to exercise without chest pain and/or shortness of breath   Unable to go up a flight of stairs without chest pain and/or shortness of breath     Anesthesia review: Hx: HTN,Pre-DIA.  Patient denies shortness of breath, fever, cough and chest pain at PAT appointment   Patient verbalized understanding of instructions that were given to them at the PAT appointment. Patient was also instructed that they will need to review over the PAT instructions again at home before surgery.

## 2022-02-27 NOTE — H&P (Signed)
TOTAL HIP ADMISSION H&P  Patient is admitted for left total hip arthroplasty.  Subjective:  Chief Complaint: left hip pain  HPI: Tiffany Bradley, 76 y.o. female, has a history of pain and functional disability in the left hip(s) due to arthritis and patient has failed non-surgical conservative treatments for greater than 12 weeks to include NSAID's and/or analgesics, corticosteriod injections, use of assistive devices, and activity modification.  Onset of symptoms was gradual starting 2 years ago with gradually worsening course since that time.The patient noted no past surgery on the left hip(s).  Patient currently rates pain in the left hip at 10 out of 10 with activity. Patient has night pain, worsening of pain with activity and weight bearing, trendelenberg gait, pain that interfers with activities of daily living, and pain with passive range of motion. Patient has evidence of subchondral sclerosis, periarticular osteophytes, and joint space narrowing by imaging studies. This condition presents safety issues increasing the risk of falls.  There is no current active infection.  Patient Active Problem List   Diagnosis Date Noted   Small bowel obstruction (HCC) 12/13/2021   Hyperglycemia 12/13/2021   Unilateral primary osteoarthritis, left hip 12/12/2021   Viral upper respiratory tract infection 03/05/2021   History of retinal detachment 03/12/2020   Pure hypercholesterolemia 03/12/2020   PUD (peptic ulcer disease) 05/09/2019   OAB (overactive bladder) 05/09/2019   Lumbar back pain 05/09/2019   DDD (degenerative disc disease), lumbar 05/09/2019   Status post total knee replacement, 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 02/11/2018   Iron deficiency anemia 04/28/2017   Vitamin D deficiency 04/28/2017   Osteopenia of lumbar spine 04/28/2017   Gastritis, erosive    Abdominal pain, epigastric 12/12/2016   Colon cancer screening 11/03/2016   Essential hypertension 11/03/2016   Primary osteoarthritis of both knees  11/03/2016   GERD (gastroesophageal reflux disease) 11/03/2016   Age-related osteoporosis without current pathological fracture 11/03/2016   Perforation of sigmoid colon (HCC) 06/29/2016   Past Medical History:  Diagnosis Date   Arthritis    Back pain    Cataract    DJD (degenerative joint disease)    Full dentures    GERD (gastroesophageal reflux disease)    Glaucoma    History of bleeding ulcers    patient reports multiple hospitalizations remotely   Hypertension    Pre-diabetes    Wears glasses     Past Surgical History:  Procedure Laterality Date   BREAST REDUCTION SURGERY Bilateral 04/25/2013   Procedure: MAMMARY REDUCTION  (BREAST);  Surgeon: Louisa Second, MD;  Location: [facility];  Service: Plastics;  Laterality: Bilateral;   COLONOSCOPY     2008, normal per patient   COLONOSCOPY N/A 01/16/2017   Procedure: COLONOSCOPY;  Surgeon: West Bali, MD;  Location: [facility];  Service: Endoscopy;  Laterality: N/A;  830    COSMETIC SURGERY     ESOPHAGOGASTRODUODENOSCOPY N/A 01/16/2017   Procedure: ESOPHAGOGASTRODUODENOSCOPY (EGD);  Surgeon: West Bali, MD;  Location: [facility];  Service: Endoscopy;  Laterality: N/A;   PARTIAL COLECTOMY N/A 06/29/2016   Procedure: PARTIAL COLECTOMY;  Surgeon: Ancil Linsey, MD;  Location: [facility];  Service: General;  Laterality: N/A;   REDUCTION MAMMAPLASTY     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   Procedure: TOTAL KNEE ARTHROPLASTY;  Surgeon: Vickki Hearing, MD;  Location: [facility];  Service: Orthopedics;  Laterality: Left;  DePuy    TUBAL LIGATION      No current facility-administered medications for this encounter.  Current Outpatient Medications  Medication Sig Dispense Refill Last Dose   amLODipine (NORVASC) 10 MG tablet Take 1 tablet (10 mg total) by mouth daily. 90 tablet 3    aspirin EC 325 MG EC tablet Take 1 tablet (325 mg total) by mouth daily with breakfast. 30 tablet 0     atorvastatin (LIPITOR) 20 MG tablet Take 1 tablet (20 mg total) by mouth daily. 90 tablet 3    Calcium Carb-Cholecalciferol (CALCIUM 600+D3 PO) Take 1 tablet by mouth daily.      diclofenac (VOLTAREN) 75 MG EC tablet TAKE 1 TABLET BY MOUTH TWICE DAILY AS NEEDED FOR MODERATE PAIN (Patient taking differently: Take 75 mg by mouth 2 (two) times daily.) 180 tablet 0    docusate sodium (COLACE) 100 MG capsule Take 1 capsule (100 mg total) by mouth 2 (two) times daily. 60 capsule 2    ferrous sulfate 325 (65 FE) MG tablet Take 325 mg by mouth daily.      gabapentin (NEURONTIN) 300 MG capsule Take 1 capsule (300 mg total) by mouth 2 (two) times daily. (Patient taking differently: Take 300 mg by mouth daily.) 180 capsule 3    Glucosamine HCl-MSM (GLUCOSAMINE-MSM PO) Take 2 tablets by mouth daily.      mometasone (NASONEX) 50 MCG/ACT nasal spray Place 2 sprays into the nose daily. 1 each 12    Multiple Vitamin (MULTIVITAMIN WITH MINERALS) TABS tablet Take 1 tablet by mouth daily.      omeprazole (PRILOSEC) 40 MG capsule Take 1 capsule (40 mg total) by mouth in the morning and at bedtime. (Patient taking differently: Take 40 mg by mouth daily.) 60 capsule 3    polyethylene glycol (MIRALAX) 17 g packet Take 17 g by mouth daily. 28 each 2    tiZANidine (ZANAFLEX) 4 MG tablet TAKE 1/2 TO 1 (ONE-HALF TO ONE) TABLET BY MOUTH EVERY 8 HOURS AS NEEDED FOR MUSCLE SPASM (USE SPARINGLY) 60 tablet 0    tolterodine (DETROL LA) 2 MG 24 hr capsule Take 1 capsule (2 mg total) by mouth daily. 90 capsule 3    traMADol (ULTRAM) 50 MG tablet Take 1 tablet (50 mg total) by mouth every 12 (twelve) hours as needed for moderate pain or severe pain. 60 tablet 2    No Known Allergies  Social History   Tobacco Use   Smoking status: Former    Packs/day: 3.00    Types: Cigarettes    Quit date: 04/20/1993    Years since quitting: 28.8   Smokeless tobacco: Never  Substance Use Topics   Alcohol use: Not Currently    Family History   Problem Relation Age of Onset   Kidney disease Mother    Stroke Mother    Heart attack Father    Hyperlipidemia Sister    Hypertension Sister    Diabetes Sister    Hypertension Sister    Hyperlipidemia Sister    Hypertension Brother    Hyperlipidemia Brother    Colon cancer Neg Hx    Breast cancer Neg Hx      Review of Systems  Objective:  Physical Exam Vitals reviewed.  Constitutional:      Appearance: Normal appearance.  HENT:     Head: Normocephalic and atraumatic.  Eyes:     Extraocular Movements: Extraocular movements intact.     Pupils: Pupils are equal, round, and reactive to light.  Cardiovascular:     Rate and Rhythm: Normal rate.  Pulmonary:     Effort: Pulmonary effort  is normal.     Breath sounds: Normal breath sounds.  Abdominal:     Palpations: Abdomen is soft.  Musculoskeletal:     Cervical back: Normal range of motion.     Left hip: Tenderness and bony tenderness present. Decreased range of motion. Decreased strength.  Neurological:     Mental Status: She is alert and oriented to person, place, and time.  Psychiatric:        Behavior: Behavior normal.     Vital signs in last 24 hours:    Labs:   Estimated body mass index is 27.12 kg/m as calculated from the following:   Height as of 02/18/22: 5\' 5"  (1.651 m).   Weight as of 02/18/22: 73.9 kg.   Imaging Review Plain radiographs demonstrate severe degenerative joint disease of the left hip(s). The bone quality appears to be good for age and reported activity level.      Assessment/Plan:  End stage arthritis, left hip(s)  The patient history, physical examination, clinical judgement of the provider and imaging studies are consistent with end stage degenerative joint disease of the left hip(s) and total hip arthroplasty is deemed medically necessary. The treatment options including medical management, injection therapy, arthroscopy and arthroplasty were discussed at length. The risks and  benefits of total hip arthroplasty were presented and reviewed. The risks due to aseptic loosening, infection, stiffness, dislocation/subluxation,  thromboembolic complications and other imponderables were discussed.  The patient acknowledged the explanation, agreed to proceed with the plan and consent was signed. Patient is being admitted for inpatient treatment for surgery, pain control, PT, OT, prophylactic antibiotics, VTE prophylaxis, progressive ambulation and ADL's and discharge planning.The patient is planning to be discharged home with home health services

## 2022-02-27 NOTE — Anesthesia Preprocedure Evaluation (Addendum)
Anesthesia Evaluation  Patient identified by MRN, date of birth, ID band Patient awake    Reviewed: Allergy & Precautions, NPO status , Patient's Chart, lab work & pertinent test results  History of Anesthesia Complications Negative for: history of anesthetic complications  Airway Mallampati: I  TM Distance: >3 FB Neck ROM: Full    Dental  (+) Edentulous Upper, Edentulous Lower, Dental Advisory Given   Pulmonary neg pulmonary ROS, former smoker,    Pulmonary exam normal        Cardiovascular hypertension, Pt. on medications Normal cardiovascular exam     Neuro/Psych negative neurological ROS     GI/Hepatic Neg liver ROS, GERD  ,  Endo/Other  negative endocrine ROS  Renal/GU negative Renal ROS     Musculoskeletal  (+) Arthritis ,   Abdominal   Peds  Hematology  (+) Blood dyscrasia, anemia ,   Anesthesia Other Findings   Reproductive/Obstetrics                            Anesthesia Physical Anesthesia Plan  ASA: 3  Anesthesia Plan: Spinal and MAC   Post-op Pain Management: Celebrex PO (pre-op)* and Tylenol PO (pre-op)*   Induction:   PONV Risk Score and Plan: 3 and Ondansetron, Propofol infusion and TIVA  Airway Management Planned: Natural Airway  Additional Equipment:   Intra-op Plan:   Post-operative Plan:   Informed Consent: I have reviewed the patients History and Physical, chart, labs and discussed the procedure including the risks, benefits and alternatives for the proposed anesthesia with the patient or authorized representative who has indicated his/her understanding and acceptance.     Dental advisory given  Plan Discussed with: Anesthesiologist and CRNA  Anesthesia Plan Comments:        Anesthesia Quick Evaluation

## 2022-02-28 ENCOUNTER — Ambulatory Visit (HOSPITAL_COMMUNITY): Payer: Medicare HMO | Admitting: Anesthesiology

## 2022-02-28 ENCOUNTER — Encounter (HOSPITAL_COMMUNITY): Payer: Self-pay | Admitting: Orthopaedic Surgery

## 2022-02-28 ENCOUNTER — Ambulatory Visit (HOSPITAL_BASED_OUTPATIENT_CLINIC_OR_DEPARTMENT_OTHER): Payer: Medicare HMO | Admitting: Anesthesiology

## 2022-02-28 ENCOUNTER — Ambulatory Visit (HOSPITAL_COMMUNITY): Payer: Medicare HMO

## 2022-02-28 ENCOUNTER — Other Ambulatory Visit: Payer: Self-pay

## 2022-02-28 ENCOUNTER — Observation Stay (HOSPITAL_COMMUNITY): Payer: Medicare HMO

## 2022-02-28 ENCOUNTER — Encounter (HOSPITAL_COMMUNITY)
Admission: RE | Disposition: A | Discharge status: Home Health | Payer: Self-pay | Source: Ambulatory Visit | Attending: Orthopaedic Surgery

## 2022-02-28 ENCOUNTER — Observation Stay (HOSPITAL_COMMUNITY)
Admission: RE | Admit: 2022-02-28 | Discharge: 2022-03-01 | Disposition: A | Discharge status: Home Health | Payer: Medicare HMO | Source: Ambulatory Visit | Attending: Orthopaedic Surgery | Admitting: Orthopaedic Surgery

## 2022-02-28 DIAGNOSIS — Z7982 Long term (current) use of aspirin: Secondary | ICD-10-CM | POA: Diagnosis not present

## 2022-02-28 DIAGNOSIS — Z87891 Personal history of nicotine dependence: Secondary | ICD-10-CM

## 2022-02-28 DIAGNOSIS — I10 Essential (primary) hypertension: Secondary | ICD-10-CM | POA: Diagnosis not present

## 2022-02-28 DIAGNOSIS — Z96652 Presence of left artificial knee joint: Secondary | ICD-10-CM | POA: Diagnosis not present

## 2022-02-28 DIAGNOSIS — Z96642 Presence of left artificial hip joint: Secondary | ICD-10-CM | POA: Diagnosis not present

## 2022-02-28 DIAGNOSIS — Z471 Aftercare following joint replacement surgery: Secondary | ICD-10-CM | POA: Diagnosis not present

## 2022-02-28 DIAGNOSIS — Z85038 Personal history of other malignant neoplasm of large intestine: Secondary | ICD-10-CM | POA: Diagnosis not present

## 2022-02-28 DIAGNOSIS — Z79899 Other long term (current) drug therapy: Secondary | ICD-10-CM | POA: Insufficient documentation

## 2022-02-28 DIAGNOSIS — M1612 Unilateral primary osteoarthritis, left hip: Secondary | ICD-10-CM

## 2022-02-28 DIAGNOSIS — D649 Anemia, unspecified: Secondary | ICD-10-CM | POA: Diagnosis not present

## 2022-02-28 HISTORY — PX: TOTAL HIP ARTHROPLASTY: SHX124

## 2022-02-28 LAB — TYPE AND SCREEN
ABO/RH(D): O POS
Antibody Screen: NEGATIVE

## 2022-02-28 LAB — GLUCOSE, CAPILLARY: Glucose-Capillary: 105 mg/dL — ABNORMAL HIGH (ref 70–99)

## 2022-02-28 SURGERY — ARTHROPLASTY, HIP, TOTAL, ANTERIOR APPROACH
Anesthesia: Monitor Anesthesia Care | Site: Hip | Laterality: Left

## 2022-02-28 MED ORDER — PANTOPRAZOLE SODIUM 40 MG PO TBEC
40.0000 mg | DELAYED_RELEASE_TABLET | Freq: Every day | ORAL | Status: DC
  Administered 2022-03-01: 40 mg via ORAL
  Filled 2022-02-28: qty 1

## 2022-02-28 MED ORDER — DEXAMETHASONE SODIUM PHOSPHATE 10 MG/ML IJ SOLN
INTRAMUSCULAR | Status: AC
  Filled 2022-02-28: qty 1

## 2022-02-28 MED ORDER — GABAPENTIN 300 MG PO CAPS
300.0000 mg | ORAL_CAPSULE | Freq: Every day | ORAL | Status: DC
  Administered 2022-03-01: 300 mg via ORAL
  Filled 2022-02-28: qty 1

## 2022-02-28 MED ORDER — DIPHENHYDRAMINE HCL 12.5 MG/5ML PO ELIX: 12.5000 mg | ORAL_SOLUTION | ORAL | Status: DC | PRN

## 2022-02-28 MED ORDER — BUPIVACAINE IN DEXTROSE 0.75-8.25 % IT SOLN
INTRATHECAL | Status: DC | PRN
  Administered 2022-02-28: 1.8 mL via INTRATHECAL

## 2022-02-28 MED ORDER — ATORVASTATIN CALCIUM 20 MG PO TABS
20.0000 mg | ORAL_TABLET | Freq: Every day | ORAL | Status: DC
  Administered 2022-02-28 – 2022-03-01 (×2): 20 mg via ORAL
  Filled 2022-02-28 (×2): qty 1

## 2022-02-28 MED ORDER — MENTHOL 3 MG MT LOZG: 1.0000 | LOZENGE | OROMUCOSAL | Status: DC | PRN

## 2022-02-28 MED ORDER — PROMETHAZINE HCL 25 MG/ML IJ SOLN: 6.2500 mg | INTRAMUSCULAR | Status: DC | PRN

## 2022-02-28 MED ORDER — SODIUM CHLORIDE 0.9 % IV SOLN: INTRAVENOUS | Status: DC

## 2022-02-28 MED ORDER — SODIUM CHLORIDE 0.9 % IR SOLN
Status: DC | PRN
  Administered 2022-02-28: 1000 mL

## 2022-02-28 MED ORDER — PROPOFOL 10 MG/ML IV BOLUS
INTRAVENOUS | Status: AC
  Filled 2022-02-28: qty 20

## 2022-02-28 MED ORDER — METOCLOPRAMIDE HCL 5 MG/ML IJ SOLN: 5.0000 mg | Freq: Three times a day (TID) | INTRAMUSCULAR | Status: DC | PRN

## 2022-02-28 MED ORDER — ASPIRIN 81 MG PO CHEW
81.0000 mg | CHEWABLE_TABLET | Freq: Two times a day (BID) | ORAL | Status: DC
  Administered 2022-02-28 – 2022-03-01 (×2): 81 mg via ORAL
  Filled 2022-02-28 (×2): qty 1

## 2022-02-28 MED ORDER — ALBUMIN HUMAN 5 % IV SOLN
INTRAVENOUS | Status: AC
  Filled 2022-02-28: qty 500

## 2022-02-28 MED ORDER — METHOCARBAMOL 500 MG PO TABS
500.0000 mg | ORAL_TABLET | Freq: Four times a day (QID) | ORAL | Status: DC | PRN
  Administered 2022-03-01: 500 mg via ORAL
  Filled 2022-02-28: qty 1

## 2022-02-28 MED ORDER — ASPIRIN 81 MG PO CHEW: 81.0000 mg | CHEWABLE_TABLET | Freq: Two times a day (BID) | ORAL | 0 refills | Status: DC

## 2022-02-28 MED ORDER — OXYCODONE HCL 5 MG PO TABS
5.0000 mg | ORAL_TABLET | ORAL | Status: DC | PRN
  Administered 2022-02-28 – 2022-03-01 (×3): 10 mg via ORAL
  Filled 2022-02-28 (×3): qty 2

## 2022-02-28 MED ORDER — FENTANYL CITRATE (PF) 100 MCG/2ML IJ SOLN
INTRAMUSCULAR | Status: DC | PRN
  Administered 2022-02-28 (×2): 50 ug via INTRAVENOUS

## 2022-02-28 MED ORDER — ORAL CARE MOUTH RINSE
15.0000 mL | Freq: Once | OROMUCOSAL | Status: AC
Start: 2022-02-28 — End: 2022-02-28

## 2022-02-28 MED ORDER — AMISULPRIDE (ANTIEMETIC) 5 MG/2ML IV SOLN: 10.0000 mg | Freq: Once | INTRAVENOUS | Status: DC | PRN

## 2022-02-28 MED ORDER — PHENYLEPHRINE HCL (PRESSORS) 10 MG/ML IV SOLN
INTRAVENOUS | Status: DC | PRN
  Administered 2022-02-28 (×2): 80 ug via INTRAVENOUS
  Administered 2022-02-28: 160 ug via INTRAVENOUS
  Administered 2022-02-28: 80 ug via INTRAVENOUS

## 2022-02-28 MED ORDER — DOCUSATE SODIUM 100 MG PO CAPS
100.0000 mg | ORAL_CAPSULE | Freq: Two times a day (BID) | ORAL | Status: DC
  Administered 2022-02-28 – 2022-03-01 (×2): 100 mg via ORAL
  Filled 2022-02-28 (×2): qty 1

## 2022-02-28 MED ORDER — ALBUMIN HUMAN 5 % IV SOLN
12.5000 g | Freq: Once | INTRAVENOUS | Status: AC
  Administered 2022-02-28: 12.5 g via INTRAVENOUS

## 2022-02-28 MED ORDER — ONDANSETRON HCL 4 MG/2ML IJ SOLN
INTRAMUSCULAR | Status: AC
  Filled 2022-02-28: qty 2

## 2022-02-28 MED ORDER — FENTANYL CITRATE (PF) 100 MCG/2ML IJ SOLN
INTRAMUSCULAR | Status: AC
  Filled 2022-02-28: qty 2

## 2022-02-28 MED ORDER — HYDROMORPHONE HCL 1 MG/ML IJ SOLN
0.5000 mg | INTRAMUSCULAR | Status: DC | PRN
  Administered 2022-02-28 – 2022-03-01 (×2): 1 mg via INTRAVENOUS
  Filled 2022-02-28 (×2): qty 1

## 2022-02-28 MED ORDER — MIDAZOLAM HCL 5 MG/5ML IJ SOLN
INTRAMUSCULAR | Status: DC | PRN
  Administered 2022-02-28: 2 mg via INTRAVENOUS

## 2022-02-28 MED ORDER — OXYCODONE HCL 5 MG PO TABS: 5.0000 mg | ORAL_TABLET | Freq: Four times a day (QID) | ORAL | 0 refills | Status: DC | PRN

## 2022-02-28 MED ORDER — FESOTERODINE FUMARATE ER 4 MG PO TB24
4.0000 mg | ORAL_TABLET | Freq: Every day | ORAL | Status: DC
  Administered 2022-03-01: 4 mg via ORAL
  Filled 2022-02-28: qty 1

## 2022-02-28 MED ORDER — CHLORHEXIDINE GLUCONATE 0.12 % MT SOLN
15.0000 mL | Freq: Once | OROMUCOSAL | Status: AC
  Administered 2022-02-28: 15 mL via OROMUCOSAL

## 2022-02-28 MED ORDER — POVIDONE-IODINE 10 % EX SWAB: 2.0000 | Freq: Once | CUTANEOUS | Status: DC

## 2022-02-28 MED ORDER — FENTANYL CITRATE PF 50 MCG/ML IJ SOSY: 25.0000 ug | PREFILLED_SYRINGE | INTRAMUSCULAR | Status: DC | PRN

## 2022-02-28 MED ORDER — CELECOXIB 200 MG PO CAPS
ORAL_CAPSULE | ORAL | Status: AC
  Filled 2022-02-28: qty 1

## 2022-02-28 MED ORDER — AMLODIPINE BESYLATE 10 MG PO TABS
10.0000 mg | ORAL_TABLET | Freq: Every day | ORAL | Status: DC
  Filled 2022-02-28: qty 1

## 2022-02-28 MED ORDER — POLYETHYLENE GLYCOL 3350 17 G PO PACK
17.0000 g | PACK | Freq: Every day | ORAL | Status: DC | PRN
Start: 2022-02-28 — End: 2022-03-01

## 2022-02-28 MED ORDER — ALUM & MAG HYDROXIDE-SIMETH 200-200-20 MG/5ML PO SUSP: 30.0000 mL | ORAL | Status: DC | PRN

## 2022-02-28 MED ORDER — METHOCARBAMOL 1000 MG/10ML IJ SOLN: 500.0000 mg | Freq: Four times a day (QID) | INTRAVENOUS | Status: DC | PRN

## 2022-02-28 MED ORDER — TRANEXAMIC ACID-NACL 1000-0.7 MG/100ML-% IV SOLN
1000.0000 mg | INTRAVENOUS | Status: AC
  Administered 2022-02-28: 1000 mg via INTRAVENOUS
  Filled 2022-02-28: qty 100

## 2022-02-28 MED ORDER — OXYCODONE HCL 5 MG PO TABS: 10.0000 mg | ORAL_TABLET | ORAL | Status: DC | PRN

## 2022-02-28 MED ORDER — ACETAMINOPHEN 500 MG PO TABS
ORAL_TABLET | ORAL | Status: AC
  Filled 2022-02-28: qty 2

## 2022-02-28 MED ORDER — DEXAMETHASONE SODIUM PHOSPHATE 10 MG/ML IJ SOLN
INTRAMUSCULAR | Status: DC | PRN
  Administered 2022-02-28: 10 mg via INTRAVENOUS

## 2022-02-28 MED ORDER — ONDANSETRON HCL 4 MG/2ML IJ SOLN: 4.0000 mg | Freq: Four times a day (QID) | INTRAMUSCULAR | Status: DC | PRN

## 2022-02-28 MED ORDER — LACTATED RINGERS IV SOLN: INTRAVENOUS | Status: DC

## 2022-02-28 MED ORDER — PROPOFOL 1000 MG/100ML IV EMUL
INTRAVENOUS | Status: AC
Start: 2022-02-28 — End: ?
  Filled 2022-02-28: qty 100

## 2022-02-28 MED ORDER — PHENYLEPHRINE 80 MCG/ML (10ML) SYRINGE FOR IV PUSH (FOR BLOOD PRESSURE SUPPORT)
PREFILLED_SYRINGE | INTRAVENOUS | Status: AC
  Filled 2022-02-28: qty 10

## 2022-02-28 MED ORDER — METOCLOPRAMIDE HCL 5 MG PO TABS: 5.0000 mg | ORAL_TABLET | Freq: Three times a day (TID) | ORAL | Status: DC | PRN

## 2022-02-28 MED ORDER — ACETAMINOPHEN 325 MG PO TABS
325.0000 mg | ORAL_TABLET | Freq: Four times a day (QID) | ORAL | Status: DC | PRN
  Administered 2022-03-01: 650 mg via ORAL
  Filled 2022-02-28: qty 2

## 2022-02-28 MED ORDER — FERROUS SULFATE 325 (65 FE) MG PO TABS
325.0000 mg | ORAL_TABLET | Freq: Every day | ORAL | Status: DC
  Administered 2022-03-01: 325 mg via ORAL
  Filled 2022-02-28: qty 1

## 2022-02-28 MED ORDER — ORAL CARE MOUTH RINSE
15.0000 mL | OROMUCOSAL | Status: DC | PRN
Start: 2022-02-28 — End: 2022-03-01

## 2022-02-28 MED ORDER — MIDAZOLAM HCL 2 MG/2ML IJ SOLN
INTRAMUSCULAR | Status: AC
  Filled 2022-02-28: qty 2

## 2022-02-28 MED ORDER — CELECOXIB 200 MG PO CAPS
200.0000 mg | ORAL_CAPSULE | Freq: Once | ORAL | Status: AC
  Administered 2022-02-28: 200 mg via ORAL

## 2022-02-28 MED ORDER — ONDANSETRON HCL 4 MG PO TABS: 4.0000 mg | ORAL_TABLET | Freq: Four times a day (QID) | ORAL | Status: DC | PRN

## 2022-02-28 MED ORDER — PHENOL 1.4 % MT LIQD: 1.0000 | OROMUCOSAL | Status: DC | PRN

## 2022-02-28 MED ORDER — POVIDONE-IODINE 10 % EX SWAB: 2.0000 "application " | Freq: Once | CUTANEOUS | Status: DC

## 2022-02-28 MED ORDER — ACETAMINOPHEN 500 MG PO TABS
1000.0000 mg | ORAL_TABLET | Freq: Once | ORAL | Status: AC
  Administered 2022-02-28: 1000 mg via ORAL

## 2022-02-28 MED ORDER — 0.9 % SODIUM CHLORIDE (POUR BTL) OPTIME
TOPICAL | Status: DC | PRN
  Administered 2022-02-28: 1000 mL

## 2022-02-28 MED ORDER — EPHEDRINE SULFATE (PRESSORS) 50 MG/ML IJ SOLN
INTRAMUSCULAR | Status: DC | PRN
  Administered 2022-02-28 (×2): 5 mg via INTRAVENOUS

## 2022-02-28 MED ORDER — CEFAZOLIN SODIUM-DEXTROSE 2-4 GM/100ML-% IV SOLN
2.0000 g | INTRAVENOUS | Status: AC
  Administered 2022-02-28: 2 g via INTRAVENOUS
  Filled 2022-02-28: qty 100

## 2022-02-28 MED ORDER — PROPOFOL 500 MG/50ML IV EMUL
INTRAVENOUS | Status: DC | PRN
  Administered 2022-02-28: 40 ug/kg/min via INTRAVENOUS

## 2022-02-28 MED ORDER — CEFAZOLIN SODIUM-DEXTROSE 1-4 GM/50ML-% IV SOLN
1.0000 g | Freq: Four times a day (QID) | INTRAVENOUS | Status: AC
  Administered 2022-02-28 (×2): 1 g via INTRAVENOUS
  Filled 2022-02-28 (×2): qty 50

## 2022-02-28 SURGICAL SUPPLY — 40 items
BAG COUNTER SPONGE SURGICOUNT (BAG) ×2 IMPLANT
BAG ZIPLOCK 12X15 (MISCELLANEOUS) IMPLANT
BENZOIN TINCTURE PRP APPL 2/3 (GAUZE/BANDAGES/DRESSINGS) IMPLANT
BLADE SAW SGTL 18X1.27X75 (BLADE) ×2 IMPLANT
COVER PERINEAL POST (MISCELLANEOUS) ×2 IMPLANT
COVER SURGICAL LIGHT HANDLE (MISCELLANEOUS) ×2 IMPLANT
CUP SECTOR GRIPTON 50MM (Cup) ×1 IMPLANT
DRAPE FOOT SWITCH (DRAPES) ×2 IMPLANT
DRAPE STERI IOBAN 125X83 (DRAPES) ×2 IMPLANT
DRAPE U-SHAPE 47X51 STRL (DRAPES) ×4 IMPLANT
DRSG AQUACEL AG ADV 3.5X10 (GAUZE/BANDAGES/DRESSINGS) ×2 IMPLANT
DURAPREP 26ML APPLICATOR (WOUND CARE) ×2 IMPLANT
ELECT REM PT RETURN 15FT ADLT (MISCELLANEOUS) ×2 IMPLANT
GAUZE XEROFORM 1X8 LF (GAUZE/BANDAGES/DRESSINGS) ×2 IMPLANT
GLOVE BIO SURGEON STRL SZ7.5 (GLOVE) ×2 IMPLANT
GLOVE BIOGEL PI IND STRL 8 (GLOVE) ×2 IMPLANT
GLOVE BIOGEL PI INDICATOR 8 (GLOVE) ×2
GLOVE ECLIPSE 8.0 STRL XLNG CF (GLOVE) ×2 IMPLANT
GOWN STRL REUS W/ TWL XL LVL3 (GOWN DISPOSABLE) ×2 IMPLANT
GOWN STRL REUS W/TWL XL LVL3 (GOWN DISPOSABLE) ×2
HANDPIECE INTERPULSE COAX TIP (DISPOSABLE) ×1
HEAD FEM STD 32X+5 STRL (Hips) ×1 IMPLANT
HOLDER FOLEY CATH W/STRAP (MISCELLANEOUS) ×2 IMPLANT
KIT TURNOVER KIT A (KITS) ×1 IMPLANT
LINER ACET PNNCL PLUS4 NEUTRAL (Hips) IMPLANT
PACK ANTERIOR HIP CUSTOM (KITS) ×2 IMPLANT
PINNACLE PLUS 4 NEUTRAL (Hips) ×2 IMPLANT
SET HNDPC FAN SPRY TIP SCT (DISPOSABLE) ×1 IMPLANT
STAPLER VISISTAT 35W (STAPLE) ×1 IMPLANT
STEM FEM ACTIS STD SZ4 (Stem) ×1 IMPLANT
STRIP CLOSURE SKIN 1/2X4 (GAUZE/BANDAGES/DRESSINGS) IMPLANT
SUT ETHIBOND NAB CT1 #1 30IN (SUTURE) ×2 IMPLANT
SUT ETHILON 2 0 PS N (SUTURE) IMPLANT
SUT MNCRL AB 4-0 PS2 18 (SUTURE) IMPLANT
SUT VIC AB 0 CT1 36 (SUTURE) ×3 IMPLANT
SUT VIC AB 1 CT1 36 (SUTURE) ×2 IMPLANT
SUT VIC AB 2-0 CT1 27 (SUTURE) ×2
SUT VIC AB 2-0 CT1 TAPERPNT 27 (SUTURE) ×2 IMPLANT
TRAY FOLEY MTR SLVR 16FR STAT (SET/KITS/TRAYS/PACK) ×1 IMPLANT
YANKAUER SUCT BULB TIP NO VENT (SUCTIONS) ×2 IMPLANT

## 2022-02-28 NOTE — Transfer of Care (Signed)
Immediate Anesthesia Transfer of Care Note  [name]  Procedure(s) Performed: LEFT TOTAL HIP ARTHROPLASTY ANTERIOR APPROACH (Left: Hip)  Patient Location: PACU  Anesthesia Type:Spinal  Level of Consciousness: awake  Airway & Oxygen Therapy: Patient Spontanous Breathing  Post-op Assessment: Report given to RN  Post vital signs: stable  Last Vitals:  Vitals Value Taken Time  BP 87/72 02/28/22 1402  Temp    Pulse 91 02/28/22 1406  Resp 12 02/28/22 1407  SpO2 98 % 02/28/22 1406  Vitals shown include unvalidated device data.  Last Pain:  Vitals:   02/28/22 1400  TempSrc:   PainSc: 0-No pain      Patients Stated Pain Goal: 4 (02/28/22 1100)  Complications: No notable events documented.

## 2022-02-28 NOTE — Brief Op Note (Signed)
02/28/2022  1:40 PM  PATIENT:  Tiffany Bradley  76 y.o. female  PRE-OPERATIVE DIAGNOSIS:  OSTEOARTHRITIS / DEGENERATIVE JOINT DISEASE LEFT HIP  POST-OPERATIVE DIAGNOSIS:  OSTEOARTHRITIS / DEGENERATIVE JOINT   PROCEDURE:  Procedure(s): LEFT TOTAL HIP ARTHROPLASTY ANTERIOR APPROACH (Left)  SURGEON:  Surgeon(s) and Role:    * Kathryne Hitch, MD - Primary  PHYSICIAN ASSISTANT: Rexene Edison, PA-C  ANESTHESIA:   spinal  EBL:  300 mL   COUNTS:  YES  DICTATION: .Other Dictation: Dictation Number [id number]  PLAN OF CARE: Admit for overnight observation  PATIENT DISPOSITION:  PACU - hemodynamically stable.   Delay start of Pharmacological VTE agent (>24hrs) due to surgical blood loss or risk of bleeding: no

## 2022-02-28 NOTE — Interval H&P Note (Signed)
History and Physical Interval Note: The patient understands that she is here today for a left hip replacement to treat her left hip osteoarthritis.  There has been no acute or interval change in her medical status.  Please see H&P.  The risks and benefits of surgery been explained in detail and informed consent is obtained.  The left operative hip has been marked.  02/28/2022 11:15 AM  Tiffany Bradley  has presented today for surgery, with the diagnosis of OSTEOARTHRITIS / DEGENERATIVE JOINT DISEASE LEFT HIP.  The various methods of treatment have been discussed with the patient and family. After consideration of risks, benefits and other options for treatment, the patient has consented to  Procedure(s): LEFT TOTAL HIP ARTHROPLASTY ANTERIOR APPROACH (Left) as a surgical intervention.  The patient's history has been reviewed, patient examined, no change in status, stable for surgery.  I have reviewed the patient's chart and labs.  Questions were answered to the patient's satisfaction.     Tiffany Bradley

## 2022-02-28 NOTE — Anesthesia Procedure Notes (Addendum)
Spinal  Patient location during procedure: OR Start time: 02/28/2022 12:22 PM End time: 02/28/2022 12:29 PM Reason for block: surgical anesthesia Staffing Performed: anesthesiologist  Anesthesiologist: Heather Roberts, MD Performed by: Heather Roberts, MD Authorized by: Heather Roberts, MD   Preanesthetic Checklist Completed: patient identified, IV checked, risks and benefits discussed, surgical consent, monitors and equipment checked, pre-op evaluation and timeout performed Spinal Block Patient position: sitting Prep: DuraPrep Patient monitoring: cardiac monitor, continuous pulse ox and blood pressure Approach: midline Location: L2-3 Injection technique: single-shot Needle Needle type: Pencan  Needle gauge: 24 G Needle length: 9 cm Assessment Events: CSF return Additional Notes Functioning IV was confirmed and monitors were applied. Sterile prep and drape, including hand hygiene and sterile gloves were used. The patient was positioned and the spine was prepped. The skin was anesthetized with lidocaine.  Free flow of clear CSF was obtained prior to injecting local anesthetic into the CSF.  The spinal needle aspirated freely following injection.  The needle was carefully withdrawn.  The patient tolerated the procedure well.

## 2022-02-28 NOTE — Discharge Instructions (Signed)

## 2022-02-28 NOTE — Anesthesia Postprocedure Evaluation (Signed)
Anesthesia Post Note  [name]  Procedure(s) Performed: LEFT TOTAL HIP ARTHROPLASTY ANTERIOR APPROACH (Left: Hip)     Patient location during evaluation: PACU Anesthesia Type: MAC Level of consciousness: awake and alert Pain management: pain level controlled Vital Signs Assessment: post-procedure vital signs reviewed and stable Respiratory status: spontaneous breathing and respiratory function stable Cardiovascular status: blood pressure returned to baseline and stable Postop Assessment: spinal receding Anesthetic complications: no   No notable events documented.  Last Vitals:  Vitals:   02/28/22 1430 02/28/22 1445  BP: 98/67 103/68  Pulse: 88 88  Resp: 12 11  Temp:    SpO2: 98% 99%    Last Pain:  Vitals:   02/28/22 1445  TempSrc:   PainSc: 0-No pain                 Skyelyn Scruggs DANIEL

## 2022-03-01 DIAGNOSIS — M1612 Unilateral primary osteoarthritis, left hip: Secondary | ICD-10-CM | POA: Diagnosis not present

## 2022-03-01 DIAGNOSIS — Z85038 Personal history of other malignant neoplasm of large intestine: Secondary | ICD-10-CM | POA: Diagnosis not present

## 2022-03-01 DIAGNOSIS — Z87891 Personal history of nicotine dependence: Secondary | ICD-10-CM | POA: Diagnosis not present

## 2022-03-01 DIAGNOSIS — Z96652 Presence of left artificial knee joint: Secondary | ICD-10-CM | POA: Diagnosis not present

## 2022-03-01 DIAGNOSIS — Z79899 Other long term (current) drug therapy: Secondary | ICD-10-CM | POA: Diagnosis not present

## 2022-03-01 DIAGNOSIS — Z7982 Long term (current) use of aspirin: Secondary | ICD-10-CM | POA: Diagnosis not present

## 2022-03-01 DIAGNOSIS — I10 Essential (primary) hypertension: Secondary | ICD-10-CM | POA: Diagnosis not present

## 2022-03-01 LAB — CBC
HCT: 25.2 % — ABNORMAL LOW (ref 36.0–46.0)
Hemoglobin: 8 g/dL — ABNORMAL LOW (ref 12.0–15.0)
MCH: 26.6 pg (ref 26.0–34.0)
MCHC: 31.7 g/dL (ref 30.0–36.0)
MCV: 83.7 fL (ref 80.0–100.0)
Platelets: 200 10*3/uL (ref 150–400)
RBC: 3.01 MIL/uL — ABNORMAL LOW (ref 3.87–5.11)
RDW: 16.7 % — ABNORMAL HIGH (ref 11.5–15.5)
WBC: 7.2 10*3/uL (ref 4.0–10.5)
nRBC: 0 % (ref 0.0–0.2)

## 2022-03-01 LAB — HEMOGLOBIN AND HEMATOCRIT, BLOOD
HCT: 24.8 % — ABNORMAL LOW (ref 36.0–46.0)
Hemoglobin: 7.8 g/dL — ABNORMAL LOW (ref 12.0–15.0)

## 2022-03-01 NOTE — TOC Transition Note (Addendum)
Transition of Care [facility]) - CM/SW Discharge Note   Patient Details  Name: BRYNJA MARKER MRN: [id number] Date of Birth: 03/22/1946  [facility] CM/SW Contact:  Darleene Cleaver, LCSW Phone Number: 03/01/2022, 11:31 AM   Clinical Narrative:     CSW spoke to patient to find out if she needed any equipment to go home with, per patient, she has has a walker at home and does not need any other equipment.  Patient's home health was prearranged with [facility].  Plan to discharge back home, no other needs.  CSW signing off, please reconsult if other social work needs arise.   Final next level of care: Home w Home Health Services Barriers to Discharge: Barriers Resolved   Patient Goals and CMS Choice Patient states their goals for this hospitalization and ongoing recovery are:: To return back home with home health. CMS Medicare.gov Compare Post Acute Care list provided to:: Patient Choice offered to / list presented to : Patient  Discharge Placement                       Discharge Plan and Services                                     Social Determinants of Health (SDOH) Interventions     Readmission Risk Interventions     No data to display

## 2022-03-01 NOTE — Op Note (Unsigned)
NAME: Tiffany Bradley, Tiffany Bradley MEDICAL RECORD NO: [id number] ACCOUNT NO: [id number] DATE OF BIRTH: December 01, 1945 FACILITY: [facility]ATION: [id number] PHYSICIAN: Vanita Panda. Magnus Ivan, MD  Operative Report   DATE OF PROCEDURE: 02/28/2022  PREOPERATIVE DIAGNOSIS:  Primary osteoarthritis and degenerative joint disease, left hip.  POSTOPERATIVE DIAGNOSIS:  Primary osteoarthritis and degenerative joint disease, left hip.  PROCEDURE:  Left total hip arthroplasty through direct anterior approach.  IMPLANTS:  DePuy Sector Gription acetabular component size 50, size 32+4 neutral polyethylene liner, size 4 ACTIS femoral component with standard offset, size 32+5 metal hip ball.  SURGEON:  Vanita Panda. Magnus Ivan MD  ASSISTANT:  Richardean Canal, PA-C  ANESTHESIA:  Spinal.  ANTIBIOTICS:  2 g IV Ancef.  BLOOD LOSS:  300 mL.  COMPLICATIONS:  None.  INDICATIONS:  The patient is a 76 year old active female with debilitating arthritis involving her left hip that has been seen on clinical exam and x-ray findings.  She does have a history of a left total knee arthroplasty remotely.  At this point, her  left hip pain is daily and is detrimentally affecting her mobility, her quality of life and activities of daily living.  She has failed all forms of conservative treatment and does wish to proceed with total hip arthroplasty.  We talked in length and  detail about this type of surgery and explained the risk of acute blood loss anemia, nerve or vessel injury, fracture, infection, DVT, implant failure, dislocation, leg length differences and skin and soft tissue issues.  We talked about our goals being  decreased pain, improve mobility and overall improve quality of life.  DESCRIPTION OF PROCEDURE:  After informed consent was obtained, appropriate left hip was marked.  She was brought to the operating room and sat up on the stretcher where spinal anesthesia was obtained.  She was laid in supine position on the stretcher.    Foley catheter was placed.  Traction boots were placed on both her feet.  Next, she was placed supine on the Hana fracture table, the perineal post in place and both legs in line skeletal traction devices.  No traction applied.  Her left operative hip  was prepped and draped with DuraPrep and sterile drapes.  A timeout was called.  She was identified as correct patient, correct left hip.  I then made an incision just inferior and posterior to the anterior iliac spine and carried this obliquely down the  leg.  We dissected down tensor fascia lata muscle.  Tensor fascia was then divided longitudinally to proceed with direct anterior approach to the hip.  We identified and cauterized circumflex vessels, identified the hip capsule, opened the hip capsule  in L-type format finding a moderate joint effusion.  Cobra retractor placed around the medial and lateral femoral neck and made our femoral neck cut with an oscillating saw and completed this with an osteotome.  We placed a corkscrew guide in the femoral  head and removed the femoral head in its entirety and found a wide area devoid of cartilage.  I then placed a bent Hohmann over the medial acetabular rim and removed remnants of acetabular labrum and other debris.  We then began reaming under direct  visualization from a size 43 reamer in a stepwise increments going up to a size 49 reamer with all reamers placed under direct visualization.  Last reamer was placed under direct fluoroscopy, so we could obtain our depth of reaming, our inclination and  anteversion.  I then placed real DePuy Sector Gription acetabular component  size 50 and a 32+4 polyethylene liner based on medialization and offset.  Attention was then turned to the femur.  With the leg externally rotated to 120 degrees, extended and  adducted, we were able to place a Mueller retractor medially and Hohmann retractor behind the greater trochanter.  We released lateral joint capsule and used a box  cutting osteotome to enter the femoral canal and a rongeur to lateralize, then began  broaching using the ACTIS broaching system from a size 0 going up to a size 4.  With a size 4 in place, we trialed a standard offset femoral neck and a 32+1 hip ball, reduced this in the acetabulum.  We were pleased with stability and range of motion,  but we needed just a little bit more leg length.  We dislocated the hip, removed the trial components.  We placed the real ACTIS femoral component, size 4 standard offset and went with a 32+5 real metal hip ball.  We reduced this in the acetabulum.  We  were pleased with leg length, offset, range of motion and stability, assessed radiographically and mechanically.  We then irrigated the soft tissue with normal saline solution.  The joint capsule was closed with interrupted #1 Ethibond suture followed by  #1 Vicryl to close the tensor fascia, 0 Vicryl was used to close the deep tissue and 2-0 Vicryl was used to close subcutaneous tissue.  The skin was closed with staples.  An Aquacel dressing was applied.  She was taken off the Hana table and taken to  recovery room in stable condition with all final counts being correct.  No complications noted.  Of note, Rexene Edison, PA-C, assisted during the entire case and assistance was crucial for facilitating all aspects of this case and was medically necessary.   He was involved with retracting soft tissues, helping guide implant placement and was involved with a layered closure of the wound.   NIK D: 02/28/2022 1:38:43 pm T: 03/01/2022 1:56:00 am  JOB: [id number]/ [id number]

## 2022-03-01 NOTE — Evaluation (Signed)
Physical Therapy Evaluation Patient Details Name: Tiffany Bradley MRN: [id number] DOB: 1946/05/27 Today's Date: 03/01/2022  History of Present Illness  Pt s/p L THR and with hx of L TKR, DDD and osteroporosis  Clinical Impression  Pt s/p L THR and presents with decreased L LE strength/ROM and post op pain limiting functional mobility.  Pt should progress to dc home with family assist.     Recommendations for follow up therapy are one component of a multi-disciplinary discharge planning process, led by the attending physician.  Recommendations may be updated based on patient status, additional functional criteria and insurance authorization.  Follow Up Recommendations Follow physician's recommendations for discharge plan and follow up therapies      Assistance Recommended at Discharge Frequent or constant Supervision/Assistance  Patient can return home with the following  A little help with walking and/or transfers;A little help with bathing/dressing/bathroom;Assistance with cooking/housework;Assist for transportation;Help with stairs or ramp for entrance    Equipment Recommendations None recommended by PT  Recommendations for Other Services       Functional Status Assessment Patient has had a recent decline in their functional status and demonstrates the ability to make significant improvements in function in a reasonable and predictable amount of time.     Precautions / Restrictions Precautions Precautions: Fall Restrictions Weight Bearing Restrictions: No LLE Weight Bearing: Weight bearing as tolerated      Mobility  Bed Mobility               General bed mobility comments: Pt up in recliner and requests back to same    Transfers Overall transfer level: Needs assistance   Transfers: Sit to/from Stand Sit to Stand: Min assist           General transfer comment: cues for LE management and use of UEs to self assist    Ambulation/Gait Ambulation/Gait assistance:  Min assist Gait Distance (Feet): 64 Feet Assistive device: Rolling walker (2 wheels) Gait Pattern/deviations: Step-to pattern, Decreased step length - right, Decreased step length - left, Shuffle, Trunk flexed, Antalgic, Decreased stance time - left Gait velocity: decr     General Gait Details: cues for sequence, posture and position from [employer]            Wheelchair Mobility    Modified Rankin (Stroke Patients Only)       Balance Overall balance assessment: Needs assistance Sitting-balance support: No upper extremity supported, Feet supported Sitting balance-Leahy Scale: Good     Standing balance support: Single extremity supported Standing balance-Leahy Scale: Poor                               Pertinent Vitals/Pain Pain Assessment Pain Assessment: 0-10 Pain Score: 6  Pain Location: L hip Pain Descriptors / Indicators: Aching, Sore Pain Intervention(s): Limited activity within patient's tolerance, Monitored during session, Premedicated before session, Ice applied    Home Living Family/patient expects to be discharged to:: Private residence Living Arrangements: Children Available Help at Discharge: Family Type of Home: House Home Access: Stairs to enter Entrance Stairs-Rails: Right;Left;Can reach both Secretary/administrator of Steps: 3 Alternate Level Stairs-Number of Steps: 3 Home Layout: Multi-level Home Equipment: Cane - single Librarian, academic (2 wheels)      Prior Function Prior Level of Function : Independent/Modified Independent             Mobility Comments: using cane or RW as needed  Hand Dominance        Extremity/Trunk Assessment   Upper Extremity Assessment Upper Extremity Assessment: Overall WFL for tasks assessed    Lower Extremity Assessment Lower Extremity Assessment: LLE deficits/detail LLE Deficits / Details: 2+/5 strength at hip with AAROM at hip to 80 flex and 15 abd    Cervical / Trunk  Assessment Cervical / Trunk Assessment: Normal  Communication   Communication: No difficulties  Cognition Arousal/Alertness: Awake/alert Behavior During Therapy: WFL for tasks assessed/performed Overall Cognitive Status: Within Functional Limits for tasks assessed                                          General Comments      Exercises Total Joint Exercises Ankle Circles/Pumps: AROM, Both, 15 reps, Supine Quad Sets: AROM, Both, 10 reps, Supine Heel Slides: AAROM, Left, 20 reps, Supine Hip ABduction/ADduction: AAROM, Left, 15 reps, Supine Long Arc Quad: AAROM, Left, 10 reps, Seated   Assessment/Plan    PT Assessment Patient needs continued PT services  PT Problem List Decreased strength;Decreased range of motion;Decreased activity tolerance;Decreased balance;Decreased mobility;Decreased knowledge of use of DME;Pain       PT Treatment Interventions Gait training;Stair training;DME instruction;Functional mobility training;Therapeutic activities;Therapeutic exercise;Balance training;Patient/family education    PT Goals (Current goals can be found in the Care Plan section)  Acute Rehab PT Goals Patient Stated Goal: Regain IND PT Goal Formulation: With patient Time For Goal Achievement: 03/15/22 Potential to Achieve Goals: Good    Frequency 7X/week     Co-evaluation               AM-PAC PT "6 Clicks" Mobility  Outcome Measure Help needed turning from your back to your side while in a flat bed without using bedrails?: A Little Help needed moving from lying on your back to sitting on the side of a flat bed without using bedrails?: A Little Help needed moving to and from a bed to a chair (including a wheelchair)?: A Little Help needed standing up from a chair using your arms (e.g., wheelchair or bedside chair)?: A Little Help needed to walk in hospital room?: A Little Help needed climbing 3-5 steps with a railing? : A Lot 6 Click Score: 17    End of  Session Equipment Utilized During Treatment: Gait belt Activity Tolerance: Patient tolerated treatment well Patient left: in chair;with call bell/phone within reach;with chair alarm set Nurse Communication: Mobility status PT Visit Diagnosis: Unsteadiness on feet (R26.81);Difficulty in walking, not elsewhere classified (R26.2)    Time: 2951-8841 PT Time Calculation (min) (ACUTE ONLY): 32 min   Charges:   PT Evaluation $PT Eval Low Complexity: 1 Low PT Treatments $Therapeutic Exercise: 8-22 mins        Mauro Kaufmann PT Acute Rehabilitation Services Pager [phone] Office [phone]   Jazzelle Zhang 03/01/2022, 9:47 AM

## 2022-03-01 NOTE — Progress Notes (Signed)
Physical Therapy Treatment Patient Details Name: Tiffany Bradley MRN: [id number] DOB: 1946-07-21 Today's Date: 03/01/2022   History of Present Illness Pt s/p L THR and with hx of L TKR, DDD and osteroporosis    PT Comments    Pt motivated and progressing well with mobility.  Pt up to ambulate in hall, negotiated stairs, reviewed bed mobility, and reviewed car transfers.  Pt hopeful for dc home this date.   Recommendations for follow up therapy are one component of a multi-disciplinary discharge planning process, led by the attending physician.  Recommendations may be updated based on patient status, additional functional criteria and insurance authorization.  Follow Up Recommendations  Follow physician's recommendations for discharge plan and follow up therapies     Assistance Recommended at Discharge Frequent or constant Supervision/Assistance  Patient can return home with the following A little help with walking and/or transfers;A little help with bathing/dressing/bathroom;Assistance with cooking/housework;Assist for transportation;Help with stairs or ramp for entrance   Equipment Recommendations  None recommended by PT    Recommendations for Other Services       Precautions / Restrictions Precautions Precautions: Fall Restrictions Weight Bearing Restrictions: No LLE Weight Bearing: Weight bearing as tolerated     Mobility  Bed Mobility Overal bed mobility: Needs Assistance Bed Mobility: Supine to Sit, Sit to Supine     Supine to sit: Min guard Sit to supine: Min guard   General bed mobility comments: Increased time with cues for sequence and use of gait belt    Transfers Overall transfer level: Needs assistance Equipment used: Rolling walker (2 wheels) Transfers: Sit to/from Stand Sit to Stand: Min guard, Supervision           General transfer comment: cues for LE management and use of UEs to self assist    Ambulation/Gait Ambulation/Gait assistance: Min  guard, Supervision Gait Distance (Feet): 90 Feet (and 20') Assistive device: Rolling walker (2 wheels) Gait Pattern/deviations: Step-to pattern, Decreased step length - right, Decreased step length - left, Shuffle, Trunk flexed, Antalgic, Decreased stance time - left Gait velocity: decr     General Gait Details: cues for sequence, posture and position from RW   Stairs Stairs: Yes Stairs assistance: Min assist Stair Management: Two rails, One rail Right, Forwards, With cane Number of Stairs: 7 General stair comments: 2 stairs with bil rails and 5 stairs with rail and cane; cues for sequence and foot/cane placement   Wheelchair Mobility    Modified Rankin (Stroke Patients Only)       Balance Overall balance assessment: Needs assistance Sitting-balance support: No upper extremity supported, Feet supported Sitting balance-Leahy Scale: Good     Standing balance support: Single extremity supported Standing balance-Leahy Scale: Poor                              Cognition Arousal/Alertness: Awake/alert Behavior During Therapy: WFL for tasks assessed/performed Overall Cognitive Status: Within Functional Limits for tasks assessed                                          Exercises Total Joint Exercises Ankle Circles/Pumps: AROM, Both, 15 reps, Supine Quad Sets: AROM, Both, 10 reps, Supine Heel Slides: AAROM, Left, 20 reps, Supine Hip ABduction/ADduction: AAROM, Left, 15 reps, Supine Long Arc Quad: AAROM, Left, 10 reps, Seated    General Comments  Pertinent Vitals/Pain Pain Assessment Pain Assessment: 0-10 Pain Score: 5  Pain Location: L hip Pain Descriptors / Indicators: Aching, Sore Pain Intervention(s): Limited activity within patient's tolerance, Monitored during session, Premedicated before session, Ice applied    Home Living                          Prior Function            PT Goals (current goals can now be  found in the care plan section) Acute Rehab PT Goals Patient Stated Goal: Regain IND PT Goal Formulation: With patient Time For Goal Achievement: 03/15/22 Potential to Achieve Goals: Good Progress towards PT goals: Progressing toward goals    Frequency    7X/week      PT Plan Current plan remains appropriate    Co-evaluation              AM-PAC PT "6 Clicks" Mobility   Outcome Measure  Help needed turning from your back to your side while in a flat bed without using bedrails?: A Little Help needed moving from lying on your back to sitting on the side of a flat bed without using bedrails?: A Little Help needed moving to and from a bed to a chair (including a wheelchair)?: A Little Help needed standing up from a chair using your arms (e.g., wheelchair or bedside chair)?: A Little Help needed to walk in hospital room?: A Little Help needed climbing 3-5 steps with a railing? : A Little 6 Click Score: 18    End of Session Equipment Utilized During Treatment: Gait belt Activity Tolerance: Patient tolerated treatment well Patient left: in chair;with call bell/phone within reach;with chair alarm set Nurse Communication: Mobility status PT Visit Diagnosis: Unsteadiness on feet (R26.81);Difficulty in walking, not elsewhere classified (R26.2)     Time: 1422-1450 PT Time Calculation (min) (ACUTE ONLY): 28 min  Charges:  $Gait Training: 8-22 mins $Therapeutic Activity: 8-22 mins                     Tiffany Bradley PT Acute Rehabilitation Services Pager [phone] Office [phone]    Tiffany Bradley 03/01/2022, 3:41 PM

## 2022-03-01 NOTE — Progress Notes (Signed)
  Subjective: Tiffany Bradley is a 76 y.o. female s/p left THA.  They are POD 1.  Pt's pain is controlled.  Pt has ambulated with some difficulty.  She was able to ambulate out in the hall earlier with physical therapy.  She denies any symptomatic dizziness or lightheadedness during ambulation.  No complaint of chest pain, shortness of breath, abdominal pain, nausea/vomiting, calf pain.  Pain is very well controlled and she just complains of "stiffness" in the left hip.   Objective: Vital signs in last 24 hours: Temp:  [97.6 F (36.4 C)-98.7 F (37.1 C)] 98.5 F (36.9 C) (07/01 0534) Pulse Rate:  [81-93] 88 (07/01 0534) Resp:  [11-20] 14 (07/01 0534) BP: (82-119)/(57-91) 115/62 (07/01 0534) SpO2:  [91 %-100 %] 100 % (07/01 0534) Weight:  [77.1 kg] 77.1 kg (06/30 1100)  Intake/Output from previous day: 06/30 0701 - 07/01 0700 In: 3252.6 [P.O.:480; I.V.:2272.6; IV Piggyback:500] Out: 2450 [Urine:2150; Blood:300] Intake/Output this shift: No intake/output data recorded.  Exam:  No gross blood or drainage overlying the dressing 2+ DP pulse Sensation intact distally in the left foot Able to dorsiflex and plantarflex the left foot No calf tenderness.  Negative Homans' sign.   Labs: Recent Labs    03/01/22 0339  HGB 8.0*   Recent Labs    03/01/22 0339  WBC 7.2  RBC 3.01*  HCT 25.2*  PLT 200   No results for input(s): "NA", "K", "CL", "CO2", "BUN", "CREATININE", "GLUCOSE", "CALCIUM" in the last 72 hours. No results for input(s): "LABPT", "INR" in the last 72 hours.  Assessment/Plan: Pt is POD 1 s/p left THA.    -Plan to discharge to home today or tomorrow pending patient's pain and PT eval  -Her hemoglobin is a little bit on the low side at 8.0.  Not currently symptomatic but would probably want to check this 1 more time for she leaves to make sure it is trending up and not down.  May need to consider transfusion if it continues dipping.  -WBAT with a walker  -Okay to shower,  dressing is waterproof.  Cautioned patient against soaking dressing in bath/pool/body of water     Julieanne Cotton 03/01/2022, 9:13 AM

## 2022-03-02 ENCOUNTER — Encounter (HOSPITAL_COMMUNITY): Payer: Self-pay | Admitting: Orthopaedic Surgery

## 2022-03-02 DIAGNOSIS — Z8711 Personal history of peptic ulcer disease: Secondary | ICD-10-CM | POA: Diagnosis not present

## 2022-03-02 DIAGNOSIS — R32 Unspecified urinary incontinence: Secondary | ICD-10-CM | POA: Diagnosis not present

## 2022-03-02 DIAGNOSIS — M5136 Other intervertebral disc degeneration, lumbar region: Secondary | ICD-10-CM | POA: Diagnosis not present

## 2022-03-02 DIAGNOSIS — R7303 Prediabetes: Secondary | ICD-10-CM | POA: Diagnosis not present

## 2022-03-02 DIAGNOSIS — N3281 Overactive bladder: Secondary | ICD-10-CM | POA: Diagnosis not present

## 2022-03-02 DIAGNOSIS — Z96652 Presence of left artificial knee joint: Secondary | ICD-10-CM | POA: Diagnosis not present

## 2022-03-02 DIAGNOSIS — K219 Gastro-esophageal reflux disease without esophagitis: Secondary | ICD-10-CM | POA: Diagnosis not present

## 2022-03-02 DIAGNOSIS — E559 Vitamin D deficiency, unspecified: Secondary | ICD-10-CM | POA: Diagnosis not present

## 2022-03-02 DIAGNOSIS — Z7982 Long term (current) use of aspirin: Secondary | ICD-10-CM | POA: Diagnosis not present

## 2022-03-02 DIAGNOSIS — Z96642 Presence of left artificial hip joint: Secondary | ICD-10-CM | POA: Diagnosis not present

## 2022-03-02 DIAGNOSIS — M810 Age-related osteoporosis without current pathological fracture: Secondary | ICD-10-CM | POA: Diagnosis not present

## 2022-03-02 DIAGNOSIS — H409 Unspecified glaucoma: Secondary | ICD-10-CM | POA: Diagnosis not present

## 2022-03-02 DIAGNOSIS — E7800 Pure hypercholesterolemia, unspecified: Secondary | ICD-10-CM | POA: Diagnosis not present

## 2022-03-02 DIAGNOSIS — Z471 Aftercare following joint replacement surgery: Secondary | ICD-10-CM | POA: Diagnosis not present

## 2022-03-02 DIAGNOSIS — I10 Essential (primary) hypertension: Secondary | ICD-10-CM | POA: Diagnosis not present

## 2022-03-02 DIAGNOSIS — Z87891 Personal history of nicotine dependence: Secondary | ICD-10-CM | POA: Diagnosis not present

## 2022-03-02 DIAGNOSIS — M1711 Unilateral primary osteoarthritis, right knee: Secondary | ICD-10-CM | POA: Diagnosis not present

## 2022-03-02 DIAGNOSIS — Z556 Problems related to health literacy: Secondary | ICD-10-CM | POA: Diagnosis not present

## 2022-03-05 DIAGNOSIS — Z7982 Long term (current) use of aspirin: Secondary | ICD-10-CM | POA: Diagnosis not present

## 2022-03-05 DIAGNOSIS — I10 Essential (primary) hypertension: Secondary | ICD-10-CM | POA: Diagnosis not present

## 2022-03-05 DIAGNOSIS — M1711 Unilateral primary osteoarthritis, right knee: Secondary | ICD-10-CM | POA: Diagnosis not present

## 2022-03-05 DIAGNOSIS — R7303 Prediabetes: Secondary | ICD-10-CM | POA: Diagnosis not present

## 2022-03-05 DIAGNOSIS — Z8711 Personal history of peptic ulcer disease: Secondary | ICD-10-CM | POA: Diagnosis not present

## 2022-03-05 DIAGNOSIS — Z471 Aftercare following joint replacement surgery: Secondary | ICD-10-CM | POA: Diagnosis not present

## 2022-03-05 DIAGNOSIS — M5136 Other intervertebral disc degeneration, lumbar region: Secondary | ICD-10-CM | POA: Diagnosis not present

## 2022-03-05 DIAGNOSIS — K219 Gastro-esophageal reflux disease without esophagitis: Secondary | ICD-10-CM | POA: Diagnosis not present

## 2022-03-05 DIAGNOSIS — R32 Unspecified urinary incontinence: Secondary | ICD-10-CM | POA: Diagnosis not present

## 2022-03-05 DIAGNOSIS — H409 Unspecified glaucoma: Secondary | ICD-10-CM | POA: Diagnosis not present

## 2022-03-05 DIAGNOSIS — E7800 Pure hypercholesterolemia, unspecified: Secondary | ICD-10-CM | POA: Diagnosis not present

## 2022-03-05 DIAGNOSIS — Z87891 Personal history of nicotine dependence: Secondary | ICD-10-CM | POA: Diagnosis not present

## 2022-03-05 DIAGNOSIS — M810 Age-related osteoporosis without current pathological fracture: Secondary | ICD-10-CM | POA: Diagnosis not present

## 2022-03-05 DIAGNOSIS — Z96642 Presence of left artificial hip joint: Secondary | ICD-10-CM | POA: Diagnosis not present

## 2022-03-05 DIAGNOSIS — Z96652 Presence of left artificial knee joint: Secondary | ICD-10-CM | POA: Diagnosis not present

## 2022-03-05 DIAGNOSIS — Z556 Problems related to health literacy: Secondary | ICD-10-CM | POA: Diagnosis not present

## 2022-03-05 DIAGNOSIS — N3281 Overactive bladder: Secondary | ICD-10-CM | POA: Diagnosis not present

## 2022-03-05 DIAGNOSIS — E559 Vitamin D deficiency, unspecified: Secondary | ICD-10-CM | POA: Diagnosis not present

## 2022-03-06 ENCOUNTER — Other Ambulatory Visit: Payer: Self-pay | Admitting: Orthopaedic Surgery

## 2022-03-06 ENCOUNTER — Other Ambulatory Visit: Payer: Self-pay

## 2022-03-06 MED ORDER — OXYCODONE HCL 5 MG PO TABS
5.0000 mg | ORAL_TABLET | Freq: Four times a day (QID) | ORAL | 0 refills | Status: DC | PRN
Start: 2022-03-06 — End: 2022-05-19

## 2022-03-07 DIAGNOSIS — H409 Unspecified glaucoma: Secondary | ICD-10-CM | POA: Diagnosis not present

## 2022-03-07 DIAGNOSIS — K219 Gastro-esophageal reflux disease without esophagitis: Secondary | ICD-10-CM | POA: Diagnosis not present

## 2022-03-07 DIAGNOSIS — Z87891 Personal history of nicotine dependence: Secondary | ICD-10-CM | POA: Diagnosis not present

## 2022-03-07 DIAGNOSIS — R7303 Prediabetes: Secondary | ICD-10-CM | POA: Diagnosis not present

## 2022-03-07 DIAGNOSIS — M810 Age-related osteoporosis without current pathological fracture: Secondary | ICD-10-CM | POA: Diagnosis not present

## 2022-03-07 DIAGNOSIS — E559 Vitamin D deficiency, unspecified: Secondary | ICD-10-CM | POA: Diagnosis not present

## 2022-03-07 DIAGNOSIS — R32 Unspecified urinary incontinence: Secondary | ICD-10-CM | POA: Diagnosis not present

## 2022-03-07 DIAGNOSIS — M1711 Unilateral primary osteoarthritis, right knee: Secondary | ICD-10-CM | POA: Diagnosis not present

## 2022-03-07 DIAGNOSIS — M5136 Other intervertebral disc degeneration, lumbar region: Secondary | ICD-10-CM | POA: Diagnosis not present

## 2022-03-07 DIAGNOSIS — Z8711 Personal history of peptic ulcer disease: Secondary | ICD-10-CM | POA: Diagnosis not present

## 2022-03-07 DIAGNOSIS — I10 Essential (primary) hypertension: Secondary | ICD-10-CM | POA: Diagnosis not present

## 2022-03-07 DIAGNOSIS — Z96642 Presence of left artificial hip joint: Secondary | ICD-10-CM | POA: Diagnosis not present

## 2022-03-07 DIAGNOSIS — Z7982 Long term (current) use of aspirin: Secondary | ICD-10-CM | POA: Diagnosis not present

## 2022-03-07 DIAGNOSIS — N3281 Overactive bladder: Secondary | ICD-10-CM | POA: Diagnosis not present

## 2022-03-07 DIAGNOSIS — Z471 Aftercare following joint replacement surgery: Secondary | ICD-10-CM | POA: Diagnosis not present

## 2022-03-07 DIAGNOSIS — Z96652 Presence of left artificial knee joint: Secondary | ICD-10-CM | POA: Diagnosis not present

## 2022-03-07 DIAGNOSIS — Z556 Problems related to health literacy: Secondary | ICD-10-CM | POA: Diagnosis not present

## 2022-03-07 DIAGNOSIS — E7800 Pure hypercholesterolemia, unspecified: Secondary | ICD-10-CM | POA: Diagnosis not present

## 2022-03-08 ENCOUNTER — Other Ambulatory Visit: Payer: Self-pay | Admitting: Family Medicine

## 2022-03-10 DIAGNOSIS — Z556 Problems related to health literacy: Secondary | ICD-10-CM | POA: Diagnosis not present

## 2022-03-10 DIAGNOSIS — M1711 Unilateral primary osteoarthritis, right knee: Secondary | ICD-10-CM | POA: Diagnosis not present

## 2022-03-10 DIAGNOSIS — Z96642 Presence of left artificial hip joint: Secondary | ICD-10-CM | POA: Diagnosis not present

## 2022-03-10 DIAGNOSIS — Z7982 Long term (current) use of aspirin: Secondary | ICD-10-CM | POA: Diagnosis not present

## 2022-03-10 DIAGNOSIS — Z471 Aftercare following joint replacement surgery: Secondary | ICD-10-CM | POA: Diagnosis not present

## 2022-03-10 DIAGNOSIS — M810 Age-related osteoporosis without current pathological fracture: Secondary | ICD-10-CM | POA: Diagnosis not present

## 2022-03-10 DIAGNOSIS — Z96652 Presence of left artificial knee joint: Secondary | ICD-10-CM | POA: Diagnosis not present

## 2022-03-10 DIAGNOSIS — Z8711 Personal history of peptic ulcer disease: Secondary | ICD-10-CM | POA: Diagnosis not present

## 2022-03-10 DIAGNOSIS — H409 Unspecified glaucoma: Secondary | ICD-10-CM | POA: Diagnosis not present

## 2022-03-10 DIAGNOSIS — R32 Unspecified urinary incontinence: Secondary | ICD-10-CM | POA: Diagnosis not present

## 2022-03-10 DIAGNOSIS — Z87891 Personal history of nicotine dependence: Secondary | ICD-10-CM | POA: Diagnosis not present

## 2022-03-10 DIAGNOSIS — I10 Essential (primary) hypertension: Secondary | ICD-10-CM | POA: Diagnosis not present

## 2022-03-10 DIAGNOSIS — K219 Gastro-esophageal reflux disease without esophagitis: Secondary | ICD-10-CM | POA: Diagnosis not present

## 2022-03-10 DIAGNOSIS — N3281 Overactive bladder: Secondary | ICD-10-CM | POA: Diagnosis not present

## 2022-03-10 DIAGNOSIS — E559 Vitamin D deficiency, unspecified: Secondary | ICD-10-CM | POA: Diagnosis not present

## 2022-03-10 DIAGNOSIS — R7303 Prediabetes: Secondary | ICD-10-CM | POA: Diagnosis not present

## 2022-03-10 DIAGNOSIS — E7800 Pure hypercholesterolemia, unspecified: Secondary | ICD-10-CM | POA: Diagnosis not present

## 2022-03-10 DIAGNOSIS — M5136 Other intervertebral disc degeneration, lumbar region: Secondary | ICD-10-CM | POA: Diagnosis not present

## 2022-03-10 NOTE — Telephone Encounter (Signed)
LOV: 12/25/2021 NOV: 06/23/2022

## 2022-03-10 NOTE — Telephone Encounter (Signed)
Looks like her orthopedist discontinued med.  Did he switch her to something else?

## 2022-03-12 DIAGNOSIS — Z471 Aftercare following joint replacement surgery: Secondary | ICD-10-CM | POA: Diagnosis not present

## 2022-03-12 DIAGNOSIS — Z87891 Personal history of nicotine dependence: Secondary | ICD-10-CM | POA: Diagnosis not present

## 2022-03-12 DIAGNOSIS — I10 Essential (primary) hypertension: Secondary | ICD-10-CM | POA: Diagnosis not present

## 2022-03-12 DIAGNOSIS — K219 Gastro-esophageal reflux disease without esophagitis: Secondary | ICD-10-CM | POA: Diagnosis not present

## 2022-03-12 DIAGNOSIS — H409 Unspecified glaucoma: Secondary | ICD-10-CM | POA: Diagnosis not present

## 2022-03-12 DIAGNOSIS — M5136 Other intervertebral disc degeneration, lumbar region: Secondary | ICD-10-CM | POA: Diagnosis not present

## 2022-03-12 DIAGNOSIS — Z556 Problems related to health literacy: Secondary | ICD-10-CM | POA: Diagnosis not present

## 2022-03-12 DIAGNOSIS — M1711 Unilateral primary osteoarthritis, right knee: Secondary | ICD-10-CM | POA: Diagnosis not present

## 2022-03-12 DIAGNOSIS — Z7982 Long term (current) use of aspirin: Secondary | ICD-10-CM | POA: Diagnosis not present

## 2022-03-12 DIAGNOSIS — M810 Age-related osteoporosis without current pathological fracture: Secondary | ICD-10-CM | POA: Diagnosis not present

## 2022-03-12 DIAGNOSIS — Z96642 Presence of left artificial hip joint: Secondary | ICD-10-CM | POA: Diagnosis not present

## 2022-03-12 DIAGNOSIS — R32 Unspecified urinary incontinence: Secondary | ICD-10-CM | POA: Diagnosis not present

## 2022-03-12 DIAGNOSIS — N3281 Overactive bladder: Secondary | ICD-10-CM | POA: Diagnosis not present

## 2022-03-12 DIAGNOSIS — E7800 Pure hypercholesterolemia, unspecified: Secondary | ICD-10-CM | POA: Diagnosis not present

## 2022-03-12 DIAGNOSIS — R7303 Prediabetes: Secondary | ICD-10-CM | POA: Diagnosis not present

## 2022-03-12 DIAGNOSIS — E559 Vitamin D deficiency, unspecified: Secondary | ICD-10-CM | POA: Diagnosis not present

## 2022-03-12 DIAGNOSIS — Z96652 Presence of left artificial knee joint: Secondary | ICD-10-CM | POA: Diagnosis not present

## 2022-03-12 DIAGNOSIS — Z8711 Personal history of peptic ulcer disease: Secondary | ICD-10-CM | POA: Diagnosis not present

## 2022-03-13 ENCOUNTER — Ambulatory Visit (INDEPENDENT_AMBULATORY_CARE_PROVIDER_SITE_OTHER): Payer: Medicare HMO | Admitting: Orthopaedic Surgery

## 2022-03-13 ENCOUNTER — Encounter: Payer: Self-pay | Admitting: Orthopaedic Surgery

## 2022-03-13 DIAGNOSIS — Z96642 Presence of left artificial hip joint: Secondary | ICD-10-CM

## 2022-03-13 NOTE — Progress Notes (Signed)
The patient is following up status post a left total hip arthroplasty.  She reports good range of motion and strength of her left hip.  She denies any significant difficulties.  She has been on a baby aspirin twice a day and was actually on the 3025 mg once a day prior to surgery.  She can go back to that.  She is not experiencing any foot and ankle swelling and she had denies any calf pain.  Has been wearing TED hose.  She is ambulating using a cane.  Home health therapy reported that she is doing very well.  She agrees as well.  She is really not taking any pain medicines either.  Her left hip incision looks good.  The staples were removed and Steri-Strips applied.  Her leg lengths are equal.  She will continue to increase her activities as comfort allows.  We can see her back in a month just to make sure she is doing well from a mobility standpoint.  All question concerns were answered and addressed.

## 2022-03-14 DIAGNOSIS — Z7982 Long term (current) use of aspirin: Secondary | ICD-10-CM | POA: Diagnosis not present

## 2022-03-14 DIAGNOSIS — M1711 Unilateral primary osteoarthritis, right knee: Secondary | ICD-10-CM | POA: Diagnosis not present

## 2022-03-14 DIAGNOSIS — R32 Unspecified urinary incontinence: Secondary | ICD-10-CM | POA: Diagnosis not present

## 2022-03-14 DIAGNOSIS — E7800 Pure hypercholesterolemia, unspecified: Secondary | ICD-10-CM | POA: Diagnosis not present

## 2022-03-14 DIAGNOSIS — Z8711 Personal history of peptic ulcer disease: Secondary | ICD-10-CM | POA: Diagnosis not present

## 2022-03-14 DIAGNOSIS — E559 Vitamin D deficiency, unspecified: Secondary | ICD-10-CM | POA: Diagnosis not present

## 2022-03-14 DIAGNOSIS — Z96642 Presence of left artificial hip joint: Secondary | ICD-10-CM | POA: Diagnosis not present

## 2022-03-14 DIAGNOSIS — M5136 Other intervertebral disc degeneration, lumbar region: Secondary | ICD-10-CM | POA: Diagnosis not present

## 2022-03-14 DIAGNOSIS — K219 Gastro-esophageal reflux disease without esophagitis: Secondary | ICD-10-CM | POA: Diagnosis not present

## 2022-03-14 DIAGNOSIS — R7303 Prediabetes: Secondary | ICD-10-CM | POA: Diagnosis not present

## 2022-03-14 DIAGNOSIS — N3281 Overactive bladder: Secondary | ICD-10-CM | POA: Diagnosis not present

## 2022-03-14 DIAGNOSIS — Z96652 Presence of left artificial knee joint: Secondary | ICD-10-CM | POA: Diagnosis not present

## 2022-03-14 DIAGNOSIS — M810 Age-related osteoporosis without current pathological fracture: Secondary | ICD-10-CM | POA: Diagnosis not present

## 2022-03-14 DIAGNOSIS — Z471 Aftercare following joint replacement surgery: Secondary | ICD-10-CM | POA: Diagnosis not present

## 2022-03-14 DIAGNOSIS — Z556 Problems related to health literacy: Secondary | ICD-10-CM | POA: Diagnosis not present

## 2022-03-14 DIAGNOSIS — Z87891 Personal history of nicotine dependence: Secondary | ICD-10-CM | POA: Diagnosis not present

## 2022-03-14 DIAGNOSIS — H409 Unspecified glaucoma: Secondary | ICD-10-CM | POA: Diagnosis not present

## 2022-03-14 DIAGNOSIS — I10 Essential (primary) hypertension: Secondary | ICD-10-CM | POA: Diagnosis not present

## 2022-03-20 NOTE — Discharge Summary (Signed)
Physician Discharge Summary      Patient ID: Tiffany Bradley MRN: [id number] DOB/AGE: 1945/12/26 76 y.o.  Admit date: 02/28/2022 Discharge date: 03/01/22  Admission Diagnoses:  Principal Problem:   Unilateral primary osteoarthritis, left hip Active Problems:   Status post left hip replacement   Discharge Diagnoses:  Same  Surgeries: Procedure(s): LEFT TOTAL HIP ARTHROPLASTY ANTERIOR APPROACH on 02/28/2022   Consultants:   Discharged Condition: Stable  Hospital Course: Tiffany Bradley is an 76 y.o. female who was admitted 02/28/2022 with a chief complaint of left hip pain, and found to have a diagnosis of left hip OA.  They were brought to the operating room on 02/28/2022 and underwent the above named procedures.  Pt awoke from anesthesia without complication and was transferred to the floor. On POD1, patient's pain was controlled and she mobilized well. Discharged home on POD1.  Pt will f/u with Dr. Ninfa Linden in clinic in ~2 weeks.   Antibiotics given:  Anti-infectives (From admission, onward)    Start     Dose/Rate Route Frequency Ordered Stop   02/28/22 1830  ceFAZolin (ANCEF) IVPB 1 g/50 mL premix        1 g 100 mL/hr over 30 Minutes Intravenous Every 6 hours 02/28/22 1635 03/01/22 0021   02/28/22 1030  ceFAZolin (ANCEF) IVPB 2g/100 mL premix        2 g 200 mL/hr over 30 Minutes Intravenous On call to O.R. 02/28/22 1024 02/28/22 1235     .  Recent vital signs:  Vitals:   03/01/22 1418 03/01/22 1716  BP: (!) 103/46 (!) 134/97  Pulse: 79 91  Resp: 17 18  Temp: 98.2 F (36.8 C)   SpO2: (!) 70% 100%    Recent laboratory studies:  Results for orders placed or performed during the hospital encounter of 02/28/22  Glucose, capillary  Result Value Ref Range   Glucose-Capillary 105 (H) 70 - 99 mg/dL   Comment 1 Notify RN    Comment 2 Document in Chart   CBC  Result Value Ref Range   WBC 7.2 4.0 - 10.5 K/uL   RBC 3.01 (L) 3.87 - 5.11 MIL/uL   Hemoglobin 8.0 (L) 12.0 - 15.0  g/dL   HCT 25.2 (L) 36.0 - 46.0 %   MCV 83.7 80.0 - 100.0 fL   MCH 26.6 26.0 - 34.0 pg   MCHC 31.7 30.0 - 36.0 g/dL   RDW 16.7 (H) 11.5 - 15.5 %   Platelets 200 150 - 400 K/uL   nRBC 0.0 0.0 - 0.2 %  Hemoglobin and hematocrit, blood  Result Value Ref Range   Hemoglobin 7.8 (L) 12.0 - 15.0 g/dL   HCT 24.8 (L) 36.0 - 46.0 %    Discharge Medications:   Allergies as of 03/01/2022   No Known Allergies      Medication List     STOP taking these medications    aspirin EC 325 MG tablet Replaced by: aspirin 81 MG chewable tablet   diclofenac 75 MG EC tablet Commonly known as: VOLTAREN   traMADol 50 MG tablet Commonly known as: ULTRAM       TAKE these medications    amLODipine 10 MG tablet Commonly known as: NORVASC Take 1 tablet (10 mg total) by mouth daily.   aspirin 81 MG chewable tablet Chew 1 tablet (81 mg total) by mouth 2 (two) times daily. Replaces: aspirin EC 325 MG tablet   atorvastatin 20 MG tablet Commonly known as: LIPITOR Take 1 tablet (20  mg total) by mouth daily.   CALCIUM 600+D3 PO Take 1 tablet by mouth daily.   docusate sodium 100 MG capsule Commonly known as: Colace Take 1 capsule (100 mg total) by mouth 2 (two) times daily.   ferrous sulfate 325 (65 FE) MG tablet Take 325 mg by mouth daily.   gabapentin 300 MG capsule Commonly known as: NEURONTIN Take 1 capsule (300 mg total) by mouth 2 (two) times daily. What changed: when to take this   GLUCOSAMINE-MSM PO Take 2 tablets by mouth daily.   mometasone 50 MCG/ACT nasal spray Commonly known as: Nasonex Place 2 sprays into the nose daily.   multivitamin with minerals Tabs tablet Take 1 tablet by mouth daily.   omeprazole 40 MG capsule Commonly known as: PRILOSEC Take 1 capsule (40 mg total) by mouth in the morning and at bedtime. What changed: when to take this   polyethylene glycol 17 g packet Commonly known as: MiraLax Take 17 g by mouth daily.   tiZANidine 4 MG  tablet Commonly known as: ZANAFLEX TAKE 1/2 TO 1 (ONE-HALF TO ONE) TABLET BY MOUTH EVERY 8 HOURS AS NEEDED FOR MUSCLE SPASM (USE SPARINGLY)   tolterodine 2 MG 24 hr capsule Commonly known as: DETROL LA Take 1 capsule (2 mg total) by mouth daily.        Diagnostic Studies: DG Pelvis Portable  Result Date: 02/28/2022 CLINICAL DATA:  Hip replacement EXAM: PORTABLE PELVIS 1-2 VIEWS COMPARISON:  12/02/2021 FINDINGS: There is a left hip arthroplasty in normal alignment without evidence of loosening or periprosthetic fracture. Expected soft tissue changes. IMPRESSION: Postsurgical changes of left hip arthroplasty. No evidence of immediate hardware complication. Electronically Signed   By: Caprice Renshaw M.D.   On: 02/28/2022 14:43   DG HIP UNILAT WITH PELVIS 1V LEFT  Result Date: 02/28/2022 CLINICAL DATA:  Hip replacement. EXAM: DG HIP (WITH OR WITHOUT PELVIS) 1V*L* COMPARISON:  12/02/2021 FINDINGS: Left hip arthroplasty. Left hip is located. No evidence for a periprosthetic fracture. Radiation Exposure Index (as provided by the fluoroscopic device): 2.1396 mGy Kerma IMPRESSION: Left hip arthroplasty without complicating features. Electronically Signed   By: Richarda Overlie M.D.   On: 02/28/2022 13:46   DG C-Arm 1-60 Min-No Report  Result Date: 02/28/2022 Fluoroscopy was utilized by the requesting physician.  No radiographic interpretation.    Disposition: Discharge disposition: 01-Home or Self Care       Discharge Instructions     Call MD / Call 911   Complete by: As directed    If you experience chest pain or shortness of breath, CALL 911 and be transported to the hospital emergency room.  If you develope a fever above 101 F, pus (white drainage) or increased drainage or redness at the wound, or calf pain, call your surgeon's office.   Constipation Prevention   Complete by: As directed    Drink plenty of fluids.  Prune juice may be helpful.  You may use a stool softener, such as Colace (over  the counter) 100 mg twice a day.  Use MiraLax (over the counter) for constipation as needed.   Diet - low sodium heart healthy   Complete by: As directed    Increase activity slowly as tolerated   Complete by: As directed    Post-operative opioid taper instructions:   Complete by: As directed    POST-OPERATIVE OPIOID TAPER INSTRUCTIONS: It is important to wean off of your opioid medication as soon as possible. If you do not need pain  medication after your surgery it is ok to stop day one. Opioids include: Codeine, Hydrocodone(Norco, Vicodin), Oxycodone(Percocet, oxycontin) and hydromorphone amongst others.  Long term and even short term use of opiods can cause: Increased pain response Dependence Constipation Depression Respiratory depression And more.  Withdrawal symptoms can include Flu like symptoms Nausea, vomiting And more Techniques to manage these symptoms Hydrate well Eat regular healthy meals Stay active Use relaxation techniques(deep breathing, meditating, yoga) Do Not substitute Alcohol to help with tapering If you have been on opioids for less than two weeks and do not have pain than it is ok to stop all together.  Plan to wean off of opioids This plan should start within one week post op of your joint replacement. Maintain the same interval or time between taking each dose and first decrease the dose.  Cut the total daily intake of opioids by one tablet each day Next start to increase the time between doses. The last dose that should be eliminated is the evening dose.           Follow-up Information     Kathryne Hitch, MD Follow up in 2 week(s).   Specialty: Orthopedic Surgery Contact information: [address] [place] [address] [phone]                  Signed: Julieanne Cotton 03/20/2022, 7:18 AM

## 2022-03-26 ENCOUNTER — Other Ambulatory Visit: Payer: Self-pay | Admitting: Family Medicine

## 2022-03-26 DIAGNOSIS — M170 Bilateral primary osteoarthritis of knee: Secondary | ICD-10-CM

## 2022-04-03 ENCOUNTER — Other Ambulatory Visit: Payer: Self-pay | Admitting: Family Medicine

## 2022-04-16 ENCOUNTER — Other Ambulatory Visit: Payer: Self-pay | Admitting: Family Medicine

## 2022-04-16 ENCOUNTER — Encounter: Payer: Medicare HMO | Admitting: Orthopaedic Surgery

## 2022-04-18 ENCOUNTER — Other Ambulatory Visit: Payer: Self-pay | Admitting: Family Medicine

## 2022-04-22 DIAGNOSIS — K219 Gastro-esophageal reflux disease without esophagitis: Secondary | ICD-10-CM | POA: Diagnosis not present

## 2022-04-22 DIAGNOSIS — Z008 Encounter for other general examination: Secondary | ICD-10-CM | POA: Diagnosis not present

## 2022-04-22 DIAGNOSIS — M1990 Unspecified osteoarthritis, unspecified site: Secondary | ICD-10-CM | POA: Diagnosis not present

## 2022-04-22 DIAGNOSIS — I10 Essential (primary) hypertension: Secondary | ICD-10-CM | POA: Diagnosis not present

## 2022-04-22 DIAGNOSIS — Z823 Family history of stroke: Secondary | ICD-10-CM | POA: Diagnosis not present

## 2022-04-22 DIAGNOSIS — G63 Polyneuropathy in diseases classified elsewhere: Secondary | ICD-10-CM | POA: Diagnosis not present

## 2022-04-22 DIAGNOSIS — E785 Hyperlipidemia, unspecified: Secondary | ICD-10-CM | POA: Diagnosis not present

## 2022-04-22 DIAGNOSIS — K5900 Constipation, unspecified: Secondary | ICD-10-CM | POA: Diagnosis not present

## 2022-04-22 DIAGNOSIS — E611 Iron deficiency: Secondary | ICD-10-CM | POA: Diagnosis not present

## 2022-04-22 DIAGNOSIS — J309 Allergic rhinitis, unspecified: Secondary | ICD-10-CM | POA: Diagnosis not present

## 2022-04-22 DIAGNOSIS — N3941 Urge incontinence: Secondary | ICD-10-CM | POA: Diagnosis not present

## 2022-04-22 DIAGNOSIS — N3281 Overactive bladder: Secondary | ICD-10-CM | POA: Diagnosis not present

## 2022-04-22 DIAGNOSIS — Z7982 Long term (current) use of aspirin: Secondary | ICD-10-CM | POA: Diagnosis not present

## 2022-04-23 ENCOUNTER — Encounter: Payer: Self-pay | Admitting: Orthopaedic Surgery

## 2022-04-23 ENCOUNTER — Ambulatory Visit (INDEPENDENT_AMBULATORY_CARE_PROVIDER_SITE_OTHER): Payer: Medicare HMO | Admitting: Orthopaedic Surgery

## 2022-04-23 DIAGNOSIS — Z96642 Presence of left artificial hip joint: Secondary | ICD-10-CM

## 2022-04-23 MED ORDER — DICLOFENAC SODIUM 75 MG PO TBEC: 75.0000 mg | DELAYED_RELEASE_TABLET | Freq: Every day | ORAL | 3 refills | Status: DC

## 2022-04-23 NOTE — Progress Notes (Signed)
HPI: Tiffany Bradley returns today status post left total hip arthroplasty 02/28/2022.  She is overall doing well.  Has some tenderness over the leg lateral aspect of her hip and some stiffness whenever she first begins to ambulate.  She otherwise is doing well.  She has no complaints.  She is asking if she can go back on the diclofenac which she was taking prior to surgery.  Physical exam: General well-developed well-nourished female who ambulates without any assistive device. Left hip: Surgical incisions healing well no signs of infection wound dehiscence.  Good range of motion of the left hip without pain.  Calf soft and nontender.  Dorsiflexion plantarflexion left ankle intact.  Impression: Status post left total hip arthroplasty 02/28/2022  Plan: We will refill her diclofenac.  She will work on scar tissue mobilization.  We will see her back in 6 months postop and at that time obtain an AP pelvis and lateral view of the hip.  She will follow-up with Dr. Romeo Apple for her right knee and possible total knee arthroplasty.  Questions were encouraged and answered at length.

## 2022-05-01 ENCOUNTER — Other Ambulatory Visit: Payer: Self-pay | Admitting: Nurse Practitioner

## 2022-05-01 DIAGNOSIS — J069 Acute upper respiratory infection, unspecified: Secondary | ICD-10-CM

## 2022-05-06 ENCOUNTER — Telehealth: Payer: Self-pay | Admitting: Radiology

## 2022-05-06 NOTE — Telephone Encounter (Signed)
Yes. I called her to advise. Advised her beginning of Dec should be fine, she voiced understanding, will call back to make appointment

## 2022-05-06 NOTE — Telephone Encounter (Signed)
Patient called, wants to get her other knee replaced January or so. Unsure when or if she needs to see Dr Romeo Apple to discuss.  Please call her and discuss/advise, thanks.

## 2022-05-18 ENCOUNTER — Other Ambulatory Visit: Payer: Self-pay | Admitting: Family Medicine

## 2022-05-18 DIAGNOSIS — Z96652 Presence of left artificial knee joint: Secondary | ICD-10-CM

## 2022-05-19 ENCOUNTER — Other Ambulatory Visit: Payer: Self-pay

## 2022-05-19 ENCOUNTER — Ambulatory Visit (INDEPENDENT_AMBULATORY_CARE_PROVIDER_SITE_OTHER): Payer: Medicare HMO

## 2022-05-19 DIAGNOSIS — Z0000 Encounter for general adult medical examination without abnormal findings: Secondary | ICD-10-CM

## 2022-05-19 NOTE — Progress Notes (Signed)
MEDICARE ANNUAL WELLNESS VISIT  05/19/2022  Telephone Visit Disclaimer This Medicare AWV was conducted by telephone due to national recommendations for restrictions regarding the COVID-19 Pandemic (e.g. social distancing).  I verified, using two identifiers, that I am speaking with Tiffany Bradley or their authorized healthcare agent. I discussed the limitations, risks, security, and privacy concerns of performing an evaluation and management service by telephone and the potential availability of an in-person appointment in the future. The patient expressed understanding and agreed to proceed.  Location of Patient: Home Location of Provider (nurse):  [facility]  Subjective:    Tiffany Bradley is a 76 y.o. female patient of Tiffany Norlander, DO who had a Medicare Annual Wellness Visit today via telephone. Tiffany Bradley is Retired and lives in her own home and has two grand daughters who live with her. She has three children. She reports that she is socially active and does interact with friends/family regularly. She is minimally physically active and enjoys doing crafts such as Health and safety inspector.  Patient Care Team: Tiffany Norlander, DO as PCP - General (Family Medicine) Danie Binder, MD (Inactive) as Consulting Physician (Gastroenterology) Carole Civil, MD as Consulting Physician (Orthopedic Surgery) Celestia Khat, [place] (Optometry)     05/19/2022    8:32 AM 02/28/2022    4:35 PM 02/18/2022   10:07 AM 12/13/2021   12:34 PM 05/16/2021    8:17 AM 11/09/2019   10:35 AM 05/11/2019   11:24 AM  Advanced Directives  Does Patient Have a Medical Advance Directive? No No No No Yes No No  Type of Personnel officer;Living will    Copy of Jay in Chart?     No - copy requested    Would patient like information on creating a medical advance directive? No - Patient declined No - Patient declined  No - Patient declined  No - Patient declined     Hospital  Utilization Over the Past 12 Months: # of hospitalizations or ER visits: 1 # of surgeries: 1  Review of Systems    Patient reports that her overall health is better compared to last year.  History obtained from chart review and the patient  Patient Reported Readings (BP, Pulse, CBG, Weight, etc) none  Pain Assessment Pain : No/denies pain     Current Medications & Allergies (verified) Allergies as of 05/19/2022   No Known Allergies      Medication List        Accurate as of May 19, 2022  8:39 AM. If you have any questions, ask your nurse or doctor.          STOP taking these medications    oxyCODONE 5 MG immediate release tablet Commonly known as: Oxy IR/ROXICODONE       TAKE these medications    amLODipine 10 MG tablet Commonly known as: NORVASC Take 1 tablet (10 mg total) by mouth daily.   aspirin 325 MG tablet Take 325 mg by mouth daily. What changed: Another medication with the same name was removed. Continue taking this medication, and follow the directions you see here.   atorvastatin 20 MG tablet Commonly known as: LIPITOR Take 1 tablet (20 mg total) by mouth daily.   Biotin 10 MG Caps Take by mouth.   CALCIUM 600+D3 PO Take 1 tablet by mouth daily.   diclofenac 75 MG EC tablet Commonly known as: VOLTAREN Take 1 tablet (75 mg  total) by mouth daily.   docusate sodium 100 MG capsule Commonly known as: Colace Take 1 capsule (100 mg total) by mouth 2 (two) times daily.   ferrous sulfate 325 (65 FE) MG tablet Take 325 mg by mouth daily.   gabapentin 300 MG capsule Commonly known as: NEURONTIN Take 1 capsule (300 mg total) by mouth 2 (two) times daily. What changed: when to take this   GLUCOSAMINE-MSM PO Take 2 tablets by mouth daily.   mometasone 50 MCG/ACT nasal spray Commonly known as: NASONEX Use 2 spray(s) in each nostril once daily   multivitamin with minerals Tabs tablet Take 1 tablet by mouth daily.   omeprazole 40  MG capsule Commonly known as: PRILOSEC Take 1 capsule (40 mg total) by mouth in the morning and at bedtime. What changed: when to take this   polyethylene glycol 17 g packet Commonly known as: MiraLax Take 17 g by mouth daily.   tiZANidine 4 MG tablet Commonly known as: ZANAFLEX TAKE 1/2 TO 1 (ONE-HALF TO ONE) TABLET BY MOUTH EVERY 8 HOURS AS NEEDED FOR MUSCLE SPASM. USE SPARINGLY   tolterodine 2 MG 24 hr capsule Commonly known as: DETROL LA Take 1 capsule (2 mg total) by mouth daily.   Vitamin D 50 MCG (2000 UT) Caps Take 2,000 Units by mouth daily.   WOMENS MULTIVITAMIN PO Take by mouth.        History (reviewed): Past Medical History:  Diagnosis Date   Arthritis    Back pain    Cataract    DJD (degenerative joint disease)    Full dentures    GERD (gastroesophageal reflux disease)    Glaucoma    History of bleeding ulcers    patient reports multiple hospitalizations remotely   Hypertension    Pre-diabetes    Wears glasses    Past Surgical History:  Procedure Laterality Date   BREAST REDUCTION SURGERY Bilateral 04/25/2013   Procedure: MAMMARY REDUCTION  (BREAST);  Surgeon: Cristine Polio, MD;  Location: [facility];  Service: Plastics;  Laterality: Bilateral;   COLONOSCOPY     2008, normal per patient   COLONOSCOPY N/A 01/16/2017   Procedure: COLONOSCOPY;  Surgeon: Danie Binder, MD;  [facility];  Service: Endoscopy;  Laterality: N/A;  830    COSMETIC SURGERY     ESOPHAGOGASTRODUODENOSCOPY N/A 01/16/2017   Procedure: ESOPHAGOGASTRODUODENOSCOPY (EGD);  Surgeon: Danie Binder, MD;  [facility];  Service: Endoscopy;  Laterality: N/A;   PARTIAL COLECTOMY N/A 06/29/2016   Procedure: PARTIAL COLECTOMY;  Surgeon: Vickie Epley, MD;  Location: [facility];  Service: General;  Laterality: N/A;   REDUCTION MAMMAPLASTY     TOTAL HIP ARTHROPLASTY Left 02/28/2022   Procedure: LEFT TOTAL HIP ARTHROPLASTY ANTERIOR APPROACH;   Surgeon: Mcarthur Rossetti, MD;  Location: [facility];  Service: Orthopedics;  Laterality: Left;   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   Procedure: TOTAL KNEE ARTHROPLASTY;  Surgeon: Carole Civil, MD;  Location: [facility];  Service: Orthopedics;  Laterality: Left;  DePuy    TUBAL LIGATION     Family History  Problem Relation Age of Onset   Kidney disease Mother    Stroke Mother    Heart attack Father    Hyperlipidemia Sister    Hypertension Sister    Diabetes Sister    Hypertension Sister    Hyperlipidemia Sister    Hypertension Brother    Hyperlipidemia Brother    Colon cancer Neg Hx  Breast cancer Neg Hx    Social History   Socioeconomic History   Marital status: Divorced    Spouse name: Not on file   Number of children: 3   Years of education: 11   Highest education level: Not on file  Occupational History   Occupation: Retired    Fish farm manager: [employer]  Tobacco Use   Smoking status: Former    Packs/day: 3.00    Types: Cigarettes    Quit date: 04/20/1993    Years since quitting: 29.0   Smokeless tobacco: Never  Vaping Use   Vaping Use: Never used  Substance and Sexual Activity   Alcohol use: Not Currently   Drug use: No   Sexual activity: Not Currently  Other Topics Concern   Not on file  Social History Narrative   Dezariah is retired and  her 49 year old granddaughter lives with her. She has two daughters and one son, all local that she sees regularly. She is active in church and sings in the choir. She enjoys crafting, yard work, and shopping.    Social Determinants of Health   Financial Resource Strain: Low Risk  (05/16/2021)   Overall Financial Resource Strain (CARDIA)    Difficulty of Paying Living Expenses: Not hard at all  Food Insecurity: No Food Insecurity (05/16/2021)   Hunger Vital Sign    Worried About Running Out of Food in the Last Year: Never true    Ran Out of Food in the Last Year: Never true  Transportation Needs: No Transportation  Needs (05/16/2021)   PRAPARE - Hydrologist (Medical): No    Lack of Transportation (Non-Medical): No  Physical Activity: Sufficiently Active (05/16/2021)   Exercise Vital Sign    Days of Exercise per Week: 7 days    Minutes of Exercise per Session: 30 min  Stress: No Stress Concern Present (05/16/2021)   [place]    Feeling of Stress : Not at all  Social Connections: Moderately Integrated (05/16/2021)   Social Connection and Isolation Panel [NHANES]    Frequency of Communication with Friends and Family: More than three times a week    Frequency of Social Gatherings with Friends and Family: More than three times a week    Attends Religious Services: More than 4 times per year    Active Member of [employer] or Organizations: Yes    Attends Archivist Meetings: More than 4 times per year    Marital Status: Divorced    Activities of Daily Living    05/19/2022    8:33 AM 02/28/2022    4:35 PM  In your present state of health, do you have any difficulty performing the following activities:  Hearing? 0 0  Vision? 0 0  Difficulty concentrating or making decisions? 0 0  Walking or climbing stairs? 0 1  Dressing or bathing? 0 0  Doing errands, shopping? 0 0  Preparing Food and eating ? N   Using the Toilet? N   In the past six months, have you accidently leaked urine? N   Do you have problems with loss of bowel control? N   Managing your Medications? N   Managing your Finances? N   Housekeeping or managing your Housekeeping? N     Patient Education/ Literacy How often do you need to have someone help you when you read instructions, pamphlets, or other written materials from your doctor or pharmacy?: 1 -  Never What is the last grade level you completed in school?: 11th grade  Exercise Current Exercise Habits: Home exercise routine, Type of exercise: walking, Time (Minutes): 30,  Frequency (Times/Week): 3, Weekly Exercise (Minutes/Week): 90, Intensity: Mild, Exercise limited by: None identified  Diet Patient reports consuming 3 meals a day and 1 snack(s) a day Patient reports that her primary diet is: Regular Patient reports that she does have regular access to food.   Depression Screen    12/25/2021    3:07 PM 11/13/2021    9:19 AM 05/16/2021    8:15 AM 05/15/2021    8:39 AM 01/14/2021    8:32 AM 07/16/2020    8:37 AM 03/12/2020    8:37 AM  PHQ 2/9 Scores  PHQ - 2 Score 0 0 0 0 0 0 0  PHQ- 9 Score 0 0    0 0     Fall Risk    05/19/2022    8:39 AM 12/25/2021    3:07 PM 11/13/2021    9:18 AM 05/16/2021    8:17 AM 05/15/2021    8:39 AM  Fall Risk   Falls in the past year? 0 1 0 0 0  Number falls in past yr:  0  0   Injury with Fall?  0  0   Risk for fall due to :  History of fall(s)  Impaired vision;Medication side effect;Impaired balance/gait;Orthopedic patient   Follow up Falls evaluation completed Education provided  Falls prevention discussed;Education provided      Objective:  RAEVEN LATHEN seemed alert and oriented and she participated appropriately during our telephone visit.  Blood Pressure Weight BMI  BP Readings from Last 3 Encounters:  03/01/22 (!) 134/97  02/18/22 (!) 150/93  12/25/21 113/72   Wt Readings from Last 3 Encounters:  02/28/22 170 lb (77.1 kg)  02/18/22 163 lb (73.9 kg)  12/25/21 166 lb 3.2 oz (75.4 kg)   BMI Readings from Last 1 Encounters:  02/28/22 27.44 kg/m    *Unable to obtain current vital signs, weight, and BMI due to telephone visit type  Hearing/Vision  Tiffany Bradley did not seem to have difficulty with hearing/understanding during the telephone conversation Reports that she has had a formal eye exam by an eye care professional within the past year Reports that she has not had a formal hearing evaluation within the past year *Unable to fully assess hearing and vision during telephone visit type  Cognitive Function:     05/19/2022    8:35 AM 11/09/2019   10:38 AM  6CIT Screen  What Year? 0 points 0 points  What month? 0 points 0 points  What time? 0 points 0 points  Count back from 20 0 points 0 points  Months in reverse 0 points 0 points  Repeat phrase 0 points 0 points  Total Score 0 points 0 points   (Normal:0-7, Significant for Dysfunction: >8)  Normal Cognitive Function Screening: Yes   Immunization & Health Maintenance Record Immunization History  Administered Date(s) Administered   Moderna Sars-Covid-2 Vaccination 10/06/2019, 11/04/2019, 06/23/2020, 12/13/2020    Health Maintenance  Topic Date Due   Hepatitis C Screening  Never done   TETANUS/TDAP  Never done   COVID-19 Vaccine (5 - Moderna risk series) 02/07/2021   Pneumonia Vaccine 30+ Years old (1 - PCV) 11/14/2022 (Originally 02/08/2012)   Zoster Vaccines- Shingrix (1 of 2) 02/14/2023 (Originally 02/07/1965)   MAMMOGRAM  12/24/2022   DEXA SCAN  12/24/2023   COLONOSCOPY (Pts 45-32yrs Insurance  coverage will need to be confirmed)  01/17/2027   HPV VACCINES  Aged Out   INFLUENZA VACCINE  Discontinued       Assessment  This is a routine wellness examination for Tiffany Bradley.  Health Maintenance: Due or Overdue Health Maintenance Due  Topic Date Due   Hepatitis C Screening  Never done   TETANUS/TDAP  Never done   COVID-19 Vaccine (5 - Moderna risk series) 02/07/2021    Tiffany Bradley does not need a referral for Community Assistance: Care Management:   no Social Work:    no Prescription Assistance:  no Nutrition/Diabetes Education:  no   Plan:  Personalized Goals  Goals Addressed             This Visit's Progress    Patient Stated       05/19/2022 AWV Goal: Fall Prevention  Over the next year, patient will decrease their risk for falls by: Using assistive devices, such as a cane or walker, as needed Identifying fall risks within their home and correcting them by: Removing throw rugs Adding handrails to stairs or  ramps Removing clutter and keeping a clear pathway throughout the home Increasing light, especially at night Adding shower handles/bars Raising toilet seat Identifying potential personal risk factors for falls: Medication side effects Incontinence/urgency Vestibular dysfunction Hearing loss Musculoskeletal disorders Neurological disorders Orthostatic hypotension         Personalized Health Maintenance & Screening Recommendations  Pneumococcal vaccine  Td vaccine Shingrix vaccine  Lung Cancer Screening Recommended: no (Low Dose CT Chest recommended if Age 38-80 years, 30 pack-year currently smoking OR have quit w/in past 15 years) Hepatitis C Screening recommended: no HIV Screening recommended: no  Advanced Directives: Written information was not prepared per patient's request.  Referrals & Orders No orders of the defined types were placed in this encounter.   Follow-up Plan Follow-up with Tiffany Norlander, DO as planned    I have personally reviewed and noted the following in the patient's chart:   Medical and social history Use of alcohol, tobacco or illicit drugs  Current medications and supplements Functional ability and status Nutritional status Physical activity Advanced directives List of other physicians Hospitalizations, surgeries, and ER visits in previous 12 months Vitals Screenings to include cognitive, depression, and falls Referrals and appointments  In addition, I have reviewed and discussed with Tiffany Bradley certain preventive protocols, quality metrics, and best practice recommendations. A written personalized care plan for preventive services as well as general preventive health recommendations is available and can be mailed to the patient at her request.      Burnadette Pop  05/19/2022   Patient declined after visit summary

## 2022-05-20 DIAGNOSIS — H524 Presbyopia: Secondary | ICD-10-CM | POA: Diagnosis not present

## 2022-05-20 DIAGNOSIS — H2513 Age-related nuclear cataract, bilateral: Secondary | ICD-10-CM | POA: Diagnosis not present

## 2022-06-02 ENCOUNTER — Other Ambulatory Visit: Payer: Self-pay | Admitting: Family Medicine

## 2022-06-02 DIAGNOSIS — M170 Bilateral primary osteoarthritis of knee: Secondary | ICD-10-CM

## 2022-06-02 DIAGNOSIS — J069 Acute upper respiratory infection, unspecified: Secondary | ICD-10-CM

## 2022-06-08 ENCOUNTER — Other Ambulatory Visit: Payer: Self-pay | Admitting: Family Medicine

## 2022-06-08 DIAGNOSIS — I10 Essential (primary) hypertension: Secondary | ICD-10-CM

## 2022-06-27 ENCOUNTER — Encounter: Payer: Self-pay | Admitting: Family Medicine

## 2022-06-27 ENCOUNTER — Ambulatory Visit (INDEPENDENT_AMBULATORY_CARE_PROVIDER_SITE_OTHER): Payer: Medicare HMO | Admitting: Family Medicine

## 2022-06-27 VITALS — BP 131/85 | HR 74 | Temp 97.7°F | Ht 66.0 in | Wt 167.2 lb

## 2022-06-27 DIAGNOSIS — E7800 Pure hypercholesterolemia, unspecified: Secondary | ICD-10-CM | POA: Diagnosis not present

## 2022-06-27 DIAGNOSIS — J3089 Other allergic rhinitis: Secondary | ICD-10-CM | POA: Diagnosis not present

## 2022-06-27 DIAGNOSIS — M51369 Other intervertebral disc degeneration, lumbar region without mention of lumbar back pain or lower extremity pain: Secondary | ICD-10-CM

## 2022-06-27 DIAGNOSIS — Z96652 Presence of left artificial knee joint: Secondary | ICD-10-CM | POA: Diagnosis not present

## 2022-06-27 DIAGNOSIS — M170 Bilateral primary osteoarthritis of knee: Secondary | ICD-10-CM | POA: Diagnosis not present

## 2022-06-27 DIAGNOSIS — M5136 Other intervertebral disc degeneration, lumbar region: Secondary | ICD-10-CM | POA: Diagnosis not present

## 2022-06-27 DIAGNOSIS — I10 Essential (primary) hypertension: Secondary | ICD-10-CM | POA: Diagnosis not present

## 2022-06-27 DIAGNOSIS — N3281 Overactive bladder: Secondary | ICD-10-CM | POA: Diagnosis not present

## 2022-06-27 MED ORDER — TIZANIDINE HCL 4 MG PO TABS: 4.0000 mg | ORAL_TABLET | Freq: Every evening | ORAL | 3 refills | Status: DC | PRN

## 2022-06-27 MED ORDER — MOMETASONE FUROATE 50 MCG/ACT NA SUSP: NASAL | 2 refills | Status: DC

## 2022-06-27 MED ORDER — TOLTERODINE TARTRATE ER 2 MG PO CP24: 2.0000 mg | ORAL_CAPSULE | Freq: Every day | ORAL | 3 refills | Status: DC

## 2022-06-27 MED ORDER — DICLOFENAC SODIUM 75 MG PO TBEC: 75.0000 mg | DELAYED_RELEASE_TABLET | Freq: Two times a day (BID) | ORAL | 3 refills | Status: DC | PRN

## 2022-06-27 MED ORDER — OMEPRAZOLE 40 MG PO CPDR: 40.0000 mg | DELAYED_RELEASE_CAPSULE | Freq: Every day | ORAL | 3 refills | Status: DC

## 2022-06-27 MED ORDER — AMLODIPINE BESYLATE 10 MG PO TABS: 10.0000 mg | ORAL_TABLET | Freq: Every day | ORAL | 3 refills | Status: DC

## 2022-06-27 MED ORDER — ATORVASTATIN CALCIUM 20 MG PO TABS: 20.0000 mg | ORAL_TABLET | Freq: Every day | ORAL | 3 refills | Status: DC

## 2022-06-27 MED ORDER — GABAPENTIN 300 MG PO CAPS: 300.0000 mg | ORAL_CAPSULE | Freq: Two times a day (BID) | ORAL | 1 refills | Status: DC

## 2022-06-27 NOTE — Progress Notes (Signed)
Subjective: CC: Chronic follow-up hypertension, hyperlipidemia PCP: Janora Norlander, DO HPI:Tiffany Bradley is a 76 y.o. female presenting to clinic today for:  1.  Hypertension with hyperlipidemia Patient is compliant with all medications.  No chest pain, shortness of breath or falls reported  2.  Degenerative back disease, OA of bilateral knees and hips Patient status post hip replacement.  She has history of total knee replacement on the left.  Due for one on right.  Has tramadol on hand if needed but really has avoid utilizing that medication due to history of SBO.  She does not want to become constipated.  Needs refills on the remainder of her medications however and would like to go back up to twice daily dosing of the diclofenac as this was more beneficial to her.  She also asks if a back brace would be appropriate for her as she does not feel her posture is up to par.   ROS: Per HPI  No Known Allergies Past Medical History:  Diagnosis Date   Arthritis    Back pain    Cataract    DJD (degenerative joint disease)    Full dentures    GERD (gastroesophageal reflux disease)    Glaucoma    History of bleeding ulcers    patient reports multiple hospitalizations remotely   Hypertension    Pre-diabetes    Wears glasses     Current Outpatient Medications:    amLODipine (NORVASC) 10 MG tablet, Take 1 tablet by mouth once daily, Disp: 90 tablet, Rfl: 0   aspirin 325 MG tablet, Take 325 mg by mouth daily., Disp: , Rfl:    atorvastatin (LIPITOR) 20 MG tablet, Take 1 tablet (20 mg total) by mouth daily., Disp: 90 tablet, Rfl: 3   Biotin 10 MG CAPS, Take by mouth., Disp: , Rfl:    Calcium Carb-Cholecalciferol (CALCIUM 600+D3 PO), Take 1 tablet by mouth daily., Disp: , Rfl:    Cholecalciferol (VITAMIN D) 50 MCG (2000 UT) CAPS, Take 2,000 Units by mouth daily., Disp: , Rfl:    diclofenac (VOLTAREN) 75 MG EC tablet, Take 1 tablet (75 mg total) by mouth daily., Disp: 30 tablet, Rfl: 3    docusate sodium (COLACE) 100 MG capsule, Take 1 capsule (100 mg total) by mouth 2 (two) times daily., Disp: 60 capsule, Rfl: 2   ferrous sulfate 325 (65 FE) MG tablet, Take 325 mg by mouth daily., Disp: , Rfl:    gabapentin (NEURONTIN) 300 MG capsule, Take 1 capsule by mouth twice daily, Disp: 180 capsule, Rfl: 1   gabapentin (NEURONTIN) 300 MG capsule, Take 1 capsule by mouth 2 (two) times daily., Disp: , Rfl:    Glucosamine HCl-MSM (GLUCOSAMINE-MSM PO), Take 2 tablets by mouth daily., Disp: , Rfl:    mometasone (NASONEX) 50 MCG/ACT nasal spray, Use 2 spray(s) in each nostril once daily, Disp: 17 g, Rfl: 2   Multiple Vitamin (MULTIVITAMIN WITH MINERALS) TABS tablet, Take 1 tablet by mouth daily., Disp: , Rfl:    Multiple Vitamins-Minerals (WOMENS MULTIVITAMIN PO), Take by mouth., Disp: , Rfl:    omeprazole (PRILOSEC) 40 MG capsule, Take 1 capsule by mouth once daily, Disp: 90 capsule, Rfl: 0   polyethylene glycol (MIRALAX) 17 g packet, Take 17 g by mouth daily., Disp: 28 each, Rfl: 2   tiZANidine (ZANAFLEX) 4 MG tablet, TAKE 1/2 TO 1 (ONE-HALF TO ONE) TABLET BY MOUTH EVERY 8 HOURS AS NEEDED FOR MUSCLE SPASM. USE SPARINGLY, Disp: 60 tablet, Rfl: 0  tolterodine (DETROL LA) 2 MG 24 hr capsule, Take 1 capsule (2 mg total) by mouth daily., Disp: 90 capsule, Rfl: 3 Social History   Socioeconomic History   Marital status: Divorced    Spouse name: Not on file   Number of children: 3   Years of education: 11   Highest education level: Not on file  Occupational History   Occupation: Retired    Associate Professor: [employer]  Tobacco Use   Smoking status: Former    Packs/day: 3.00    Types: Cigarettes    Quit date: 04/20/1993    Years since quitting: 29.2   Smokeless tobacco: Never  Vaping Use   Vaping Use: Never used  Substance and Sexual Activity   Alcohol use: Not Currently   Drug use: No   Sexual activity: Not Currently  Other Topics Concern   Not on file  Social History Narrative   Meagon  is retired and  her 28 year old granddaughter lives with her. She has two daughters and one son, all local that she sees regularly. She is active in church and sings in the choir. She enjoys crafting, yard work, and shopping.    Social Determinants of Health   Financial Resource Strain: Low Risk  (05/16/2021)   Overall Financial Resource Strain (CARDIA)    Difficulty of Paying Living Expenses: Not hard at all  Food Insecurity: No Food Insecurity (05/16/2021)   Hunger Vital Sign    Worried About Running Out of Food in the Last Year: Never true    Ran Out of Food in the Last Year: Never true  Transportation Needs: No Transportation Needs (05/16/2021)   PRAPARE - Administrator, Civil Service (Medical): No    Lack of Transportation (Non-Medical): No  Physical Activity: Sufficiently Active (05/16/2021)   Exercise Vital Sign    Days of Exercise per Week: 7 days    Minutes of Exercise per Session: 30 min  Stress: No Stress Concern Present (05/16/2021)   [employer] of Occupational Health - Occupational Stress Questionnaire    Feeling of Stress : Not at all  Social Connections: Moderately Integrated (05/16/2021)   Social Connection and Isolation Panel [NHANES]    Frequency of Communication with Friends and Family: More than three times a week    Frequency of Social Gatherings with Friends and Family: More than three times a week    Attends Religious Services: More than 4 times per year    Active Member of [employer] or Organizations: Yes    Attends Engineer, structural: More than 4 times per year    Marital Status: Divorced  Intimate Partner Violence: Not At Risk (05/16/2021)   Humiliation, Afraid, Rape, and Kick questionnaire    Fear of Current or Ex-Partner: No    Emotionally Abused: No    Physically Abused: No    Sexually Abused: No   Family History  Problem Relation Age of Onset   Kidney disease Mother    Stroke Mother    Heart attack Father    Hyperlipidemia  Sister    Hypertension Sister    Diabetes Sister    Hypertension Sister    Hyperlipidemia Sister    Hypertension Brother    Hyperlipidemia Brother    Colon cancer Neg Hx    Breast cancer Neg Hx     Objective: Office vital signs reviewed. BP 131/85   Pulse 74   Temp 97.7 F (36.5 C) (Temporal)   Ht 5\' 6"  (1.676 m)  Wt 167 lb 3.2 oz (75.8 kg)   SpO2 98%   BMI 26.99 kg/m   Physical Examination:  General: Awake, alert, well nourished, No acute distress HEENT: Sclera white.  Moist mucous membranes Cardio: regular rate and rhythm, S1S2 heard, no murmurs appreciated Pulm: clear to auscultation bilaterally, no wheezes, rhonchi or rales; normal work of breathing on room air MSK: Ambulating independently.  Gait is slightly antalgic.  Changes to knees bilaterally appreciated right greater than left  Assessment/ Plan: 76 y.o. 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  No changes.  Not yet due for fasting labs.  Renewals of all medications have been sent.  I advised against regular use of back braces as I do think this would weaken the core and back muscles unfortunately worsened posture.  We did discuss that there are posture type braces that she can wear  for short periods of time in efforts to improve this concern  Plan to follow-up in January if needed for preop clearance.  Fasting labs and full physical exam due in 6 months  No orders of the defined types were placed in this encounter.  No orders of the defined types were placed in this encounter.    Raliegh Ip, DO [facility] [phone]

## 2022-08-04 ENCOUNTER — Telehealth: Payer: Self-pay

## 2022-08-04 NOTE — Telephone Encounter (Signed)
Patient left message about wanting to schedule an appointment with Dr. Romeo Apple. I returned her call and had to leave a message for her to call us back, so we can get her scheduled.

## 2022-09-10 ENCOUNTER — Ambulatory Visit (INDEPENDENT_AMBULATORY_CARE_PROVIDER_SITE_OTHER): Payer: Medicare HMO

## 2022-09-10 ENCOUNTER — Ambulatory Visit: Payer: Medicare HMO | Admitting: Orthopedic Surgery

## 2022-09-10 ENCOUNTER — Encounter: Payer: Self-pay | Admitting: Orthopedic Surgery

## 2022-09-10 DIAGNOSIS — M1711 Unilateral primary osteoarthritis, right knee: Secondary | ICD-10-CM | POA: Diagnosis not present

## 2022-09-10 DIAGNOSIS — Z01818 Encounter for other preprocedural examination: Secondary | ICD-10-CM

## 2022-09-10 DIAGNOSIS — G8929 Other chronic pain: Secondary | ICD-10-CM | POA: Diagnosis not present

## 2022-09-10 DIAGNOSIS — M25561 Pain in right knee: Secondary | ICD-10-CM | POA: Diagnosis not present

## 2022-09-10 MED ORDER — BUPIVACAINE-MELOXICAM ER 400-12 MG/14ML IJ SOLN: 400.0000 mg | Freq: Once | INTRAMUSCULAR | Status: DC

## 2022-09-10 NOTE — Patient Instructions (Addendum)
Your surgery will be at [place] by Dr Harrison  plan to be in hospital overnight. The hospital will contact you with a preoperative appointment to discuss Anesthesia.  Please arrive on time or 15 minutes early for the preoperative appointment, they have a very tight schedule if you are late or do not come in your surgery will be cancelled.  The phone number for the preop area is [phone]. Please bring your medications with you for the appointment. They will tell you the arrival time for surgery and medication instructions when you have your preoperative evaluation. Do not wear nail polish the day of your surgery and if you take Phentermine you need to stop this medication ONE WEEK prior to your surgery. f you take Invokana, Farxiga, Jardiance, or Steglatro) - Hold 72 hours before the procedure.  If you take Ozempic,  Bydureon or Trulicity do not take for 8 days before your surgery. If you take Victoza, Rybelsis, Saxenda or Adlyxi stop 24 hours before the procedure. Please arrive at the hospital 2 hours before procedure if scheduled at 9:30 or later in the day or at the time the nurse tells you at your preoperative visit.   If you have my chart do not use the time given in my chart use the time given to you by the nurse during your preoperative visit.   Your surgery  time may change. Please be available for phone calls the day of your surgery and the day before. The Short Stay department may need to discuss changes about your surgery time. Not reaching the you could lead to procedure delays and possible cancellation.  You must have a ride home and someone to stay with you for 24 to 48 hours. The person taking you home will receive and sign for the your discharge instructions.  Please be prepared to give your support person's name and telephone number to [facility] [employer]. Dr Harrison will need that name and phone number post procedure.   You will also get a call from a representative of [employer], they have a machine that you will use in the first few weeks after surgery. It is called a CPM.   You will have home physical therapy for 2 weeks after surgery, the home health agency will call you before or just following the surgery to set up visits. [facility] is the agency we normally use, unless you request another agency.   You will get a call also from outpatient therapy for therapy starting when the home therapy is done.  If you have questions or need to Reschedule the surgery, call the office ask for [facility].    You have decided to proceed with knee replacement surgery. You have decided not to continue with nonoperative measures such as but not limited to oral medication, weight loss, activity modification, physical therapy, bracing, or injection.  We will perform the procedure commonly known as total knee replacement. Some of the risks associated with knee replacement surgery include but are not limited to Bleeding Infection Swelling Stiffness Blood clot Pulmonary embolism  Loosening of the implant Pain that persists even after surgery  Infection is especially devastating complication of knee surgery although rare. If infection does occur your implant will usually have to be removed and several surgeries and antibiotics will be needed to eradicate the infection prior to performing a repeat replacement.   In some cases amputation is required to eradicate the infection. In other rare cases a knee fusion is   needed   In compliance with recent [place] law in federal regulation regarding opioid use and abuse and addiction, we will taper (stop) opioid medication after 2 weeks.  If you're not comfortable with these risks and would like to continue with nonoperative treatment please let Dr. Harrison know prior to your surgery.  

## 2022-09-10 NOTE — Progress Notes (Signed)
Chief Complaint  Patient presents with   Knee Pain    Right    Knee Problem    Wants to discuss tka right knee    Encounter Diagnoses  Name Primary?   Chronic pain of right knee    Primary osteoarthritis of right knee Yes   Tiffany Bradley has had a left total knee with a [employer] fixed-bearing posterior stabilized total knee.  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   Viral upper respiratory tract infection 03/05/2021   History of retinal detachment 03/12/2020   Pure hypercholesterolemia 03/12/2020   PUD (peptic ulcer disease) 05/09/2019   OAB (overactive bladder) 05/09/2019   Lumbar back pain 05/09/2019   DDD (degenerative disc disease), lumbar 05/09/2019   Status post total knee replacement, 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  Constitutional:      Appearance: Normal appearance.  HENT:     Head: Normocephalic and atraumatic.  Eyes:     General: No scleral icterus.       Right eye: No discharge.        Left eye: No discharge.      Extraocular Movements: Extraocular movements intact.     Conjunctiva/sclera: Conjunctivae normal.     Pupils: Pupils are equal, round, and reactive to light.  Cardiovascular:     Rate and Rhythm: Normal rate.     Pulses: Normal pulses.  Musculoskeletal:     Comments: Right knee has a slight flexion contracture less than 5 degrees.  Her flexion arc is 125 degrees.  She has a severe valgus alignment to the right knee on clinical exam.  There is no instability or pseudolaxity.  Skin envelope is normal  Skin:    General: Skin is warm and dry.     Capillary Refill: Capillary refill takes less than 2 seconds.  Neurological:     General: No focal deficit present.     Mental Status: She is alert and oriented to person, place, and time.  Psychiatric:        Mood and Affect: Mood normal.        Behavior: Behavior normal.        Thought Content: Thought content normal.        Judgment: Judgment normal.    Updated imaging reveals a 16 degree valgus deformity lateral compartment arthrosis minimal osteophytes  Plan is for a right total knee arthroplasty I will ask for enhanced ability implants from [employer]

## 2022-10-01 ENCOUNTER — Other Ambulatory Visit: Payer: Self-pay | Admitting: Orthopedic Surgery

## 2022-10-01 DIAGNOSIS — M1711 Unilateral primary osteoarthritis, right knee: Secondary | ICD-10-CM

## 2022-10-06 ENCOUNTER — Telehealth: Payer: Self-pay | Admitting: Radiology

## 2022-10-06 NOTE — Addendum Note (Signed)
Addended byCandice Camp on: 10/06/2022 11:15 AM   Modules accepted: Orders

## 2022-10-06 NOTE — Telephone Encounter (Signed)
I had put it on 20th but the hospital moved to the 13th, they told me they spoke to her I changed therapy order to [facility] she will need to call them to schedule for 2 weeks after surgery

## 2022-10-06 NOTE — Telephone Encounter (Signed)
I called her left message to advise Hospital changed date  I corrected orders to [facility] OP therapy and she has to call them they will not call her the number is [phone]  I left detailed message to advise.

## 2022-10-06 NOTE — Telephone Encounter (Signed)
Patient called and LMVM on mobile phone 1/24 and recently, phone not being monitored.  She wants to know is her surgery the 13th or the 20th?  We told her one and pre admit told her another date.  She also says that her therapy is sched in [place] is closer for her. Can we please move her to [facility]?  Not sure if part/all of this is already addressed.

## 2022-10-09 NOTE — Patient Instructions (Addendum)
Tiffany Bradley  10/09/2022     @PREFPERIOPPHARMACY$ @   Your procedure is scheduled on  10/14/2022.   Report to [facility] at  [phone]  A.M.   Call this number if you have problems the morning of surgery:  [phone]  If you experience any cold or flu symptoms such as cough, fever, chills, shortness of breath, etc. between now and your scheduled surgery, please notify us at the above number.   Remember:  Do not eat or drink after midnight.     Take these medicines the morning of surgery with A SIP OF WATER         amlodipine, gabapentin, omeprazole, detrol.   Last dose of Aspirin 334m should be today 10/10/2022      Do not wear jewelry, make-up or nail polish.  Do not wear lotions, powders, or perfumes, or deodorant.  Do not shave 48 hours prior to surgery.  Men may shave face and neck.  Do not bring valuables to the hospital.  [facility] belongings or valuables.  Contacts, dentures or bridgework may not be worn into surgery.  Leave your suitcase in the car.  After surgery it may be brought to your room.  For patients admitted to the hospital, discharge time will be determined by your treatment team.  Patients discharged the day of surgery will not be allowed to drive home.    Special instructions:   DO NOT smoke tobacco or vape for 24 hours before your procedure.  Please read over the following fact sheets that you were given. Pain Booklet, Coughing and Deep Breathing, Blood Transfusion Information, Total Joint Packet, Surgical Site Infection Prevention, Anesthesia Post-op Instructions, and Care and Recovery After Surgery      Total Knee Replacement, Care After This sheet gives you information about how to care for yourself after your procedure. Your health care provider may also give you more specific instructions. If you have problems or questions, contact your health care provider. What can I expect after the procedure? After  the procedure, it is common to have: Redness, pain, and swelling at the incision area. Stiffness. Discomfort. A small amount of blood or clear fluid coming from your incision. Follow these instructions at home: Medicines Take over-the-counter and prescription medicines only as told by your health care provider. If you were prescribed a blood thinner (anticoagulant), take it as told by your health care provider. Ask your health care provider if the medicine prescribed to you: Requires you to avoid driving or using machinery. Can cause constipation. You may need to take these actions to prevent or treat constipation: Drink enough fluid to keep your urine pale yellow. Take over-the-counter or prescription medicines. Eat foods that are high in fiber, such as beans, whole grains, and fresh fruits and vegetables. Limit foods that are high in fat and processed sugars, such as fried or sweet foods. Incision care  Follow instructions from your health care provider about how to take care of your incision. Make sure you: Wash your hands with soap and water for at least 20 seconds before and after you change your bandage (dressing). If soap and water are not available, use hand sanitizer. Change your dressing as told by your health care provider. Leave stitches (sutures), staples, skin glue, or adhesive strips in place. These skin closures may need to stay in place for 2 weeks or longer. If adhesive strip edges start to loosen  and curl up, you may trim the loose edges. Do not remove adhesive strips completely unless your health care provider tells you to do that. Do not take baths, swim, or use a hot tub until your health care provider approves. Check your incision area every day for signs of infection. Check for: More redness, swelling, or pain. More fluid or blood. Warmth. Pus or a bad smell. Activity Rest as told by your health care provider. Avoid sitting for a long time without moving. Get up  to take short walks every 1-2 hours. This is important to improve blood flow and breathing. Ask for help if you feel weak or unsteady. Follow instructions from your health care provider about using a walker, crutches, or a cane. You may use your legs to support (bear) your body weight as told by your health care provider. Follow instructions about how much weight you may safely support on your affected leg (weight-bearing restrictions). A physical therapist may show you how to get out of a bed and chair and how to go up and down stairs. You will first do this with a walker, crutches, or a cane and then without any of these devices. Once you are able to walk without a limp, you may stop using a walker, crutches, or a cane. Do exercises as told by your health care provider or physical therapist. Avoid high-impact activities, including running, jumping rope, and doing jumping jacks. Do not play contact sports until your health care provider approves. Return to your normal activities as told by your health care provider. Ask your health care provider what activities are safe for you. Managing pain, stiffness, and swelling  If directed, put ice on your knee. To do this: Put ice in a plastic bag or use the icing device (cold flow pad) that you were given. Follow instructions from your health care provider about how to use the icing device. Place a towel between your skin and the bag or between your skin and the icing device. Leave the ice on for 20 minutes, 2-3 times a day. Remove the ice if your skin turns bright red. This is very important. If you cannot feel pain, heat, or cold, you have a greater risk of damage to the area. Move your toes often to reduce stiffness and swelling. Raise (elevate) your leg above the level of your heart while you are sitting or lying down. Use several pillows to keep your leg straight. Do not put a pillow just under the knee. If the knee is bent for a long time, this may  lead to stiffness. Wear elastic knee support as told by your health care provider. Safety  To help prevent falls, keep floors clear of objects you may trip over. Place items that you may need within easy reach. Wear an apron or tool belt with pockets for carrying objects. This leaves your hands free to help with your balance. Ask your health care provider when it is safe to drive. General instructions Wear compression stockings as told by your health care provider. These stockings help to prevent blood clots and reduce swelling in your legs. Continue with breathing exercises. This helps prevent lung infection. Do not use any products that contain nicotine or tobacco. These products include cigarettes, chewing tobacco, and vaping devices, such as e-cigarettes. These can delay healing after surgery. If you need help quitting, ask your health care provider. Tell your health care provider if you plan to have dental work. Also: Tell your dentist  about your joint replacement. Ask your health care provider if there are any special instructions you need to follow before having dental care and routine cleanings. Keep all follow-up visits. This is important. Contact a health care provider if: You have a fever or chills. You have a cough or feel short of breath. Your medicine is not controlling your pain. You have any of these signs of infection: More redness, swelling, or pain around your incision. More fluid or blood coming from your incision. Warmth coming from your incision. Pus or a bad smell coming from your incision. You fall. Get help right away if: You have severe pain. You have trouble breathing. You have chest pain. You have redness, swelling, pain, or warmth in your calf or leg. Your incision breaks open after sutures or staples are removed. These symptoms may represent a serious problem that is an emergency. Do not wait to see if the symptoms will go away. Get medical help right  away. Call your local emergency services (911 in the [place] Do not drive yourself to the hospital. Summary After the procedure, it is common to have pain and swelling at the incision area, a small amount of blood or fluid coming from your incision, and stiffness. Follow instructions from your health care provider about how to take care of your incision. Use crutches, a walker, or a cane as told by your health care provider. This information is not intended to replace advice given to you by your health care provider. Make sure you discuss any questions you have with your health care provider. Document Revised: 02/07/2020 Document Reviewed: 02/07/2020 Elsevier Patient Education  [place] Anesthesia, Adult, Care After The following information offers guidance on how to care for yourself after your procedure. Your health care provider may also give you more specific instructions. If you have problems or questions, contact your health care provider. What can I expect after the procedure? After the procedure, it is common for people to: Have pain or discomfort at the IV site. Have nausea or vomiting. Have a sore throat or hoarseness. Have trouble concentrating. Feel cold or chills. Feel weak, sleepy, or tired (fatigue). Have soreness and body aches. These can affect parts of the body that were not involved in surgery. Follow these instructions at home: For the time period you were told by your health care provider:  Rest. Do not participate in activities where you could fall or become injured. Do not drive or use machinery. Do not drink alcohol. Do not take sleeping pills or medicines that cause drowsiness. Do not make important decisions or sign legal documents. Do not take care of children on your own. General instructions Drink enough fluid to keep your urine pale yellow. If you have sleep apnea, surgery and certain medicines can increase your risk for breathing  problems. Follow instructions from your health care provider about wearing your sleep device: Anytime you are sleeping, including during daytime naps. While taking prescription pain medicines, sleeping medicines, or medicines that make you drowsy. Return to your normal activities as told by your health care provider. Ask your health care provider what activities are safe for you. Take over-the-counter and prescription medicines only as told by your health care provider. Do not use any products that contain nicotine or tobacco. These products include cigarettes, chewing tobacco, and vaping devices, such as e-cigarettes. These can delay incision healing after surgery. If you need help quitting, ask your health care provider. Contact a health care provider  if: You have nausea or vomiting that does not get better with medicine. You vomit every time you eat or drink. You have pain that does not get better with medicine. You cannot urinate or have bloody urine. You develop a skin rash. You have a fever. Get help right away if: You have trouble breathing. You have chest pain. You vomit blood. These symptoms may be an emergency. Get help right away. Call 911. Do not wait to see if the symptoms will go away. Do not drive yourself to the hospital. Summary After the procedure, it is common to have a sore throat, hoarseness, nausea, vomiting, or to feel weak, sleepy, or fatigue. For the time period you were told by your health care provider, do not drive or use machinery. Get help right away if you have difficulty breathing, have chest pain, or vomit blood. These symptoms may be an emergency. This information is not intended to replace advice given to you by your health care provider. Make sure you discuss any questions you have with your health care provider. Document Revised: 11/15/2021 Document Reviewed: 11/15/2021 Elsevier Patient Education  [place]. How to Use Chlorhexidine Before  Surgery Chlorhexidine gluconate (CHG) is a germ-killing (antiseptic) solution that is used to clean the skin. It can get rid of the bacteria that normally live on the skin and can keep them away for about 24 hours. To clean your skin with CHG, you may be given: A CHG solution to use in the shower or as part of a sponge bath. A prepackaged cloth that contains CHG. Cleaning your skin with CHG may help lower the risk for infection: While you are staying in the intensive care unit of the hospital. If you have a vascular access, such as a central line, to provide short-term or long-term access to your veins. If you have a catheter to drain urine from your bladder. If you are on a ventilator. A ventilator is a machine that helps you breathe by moving air in and out of your lungs. After surgery. What are the risks? Risks of using CHG include: A skin reaction. Hearing loss, if CHG gets in your ears and you have a perforated eardrum. Eye injury, if CHG gets in your eyes and is not rinsed out. The CHG product catching fire. Make sure that you avoid smoking and flames after applying CHG to your skin. Do not use CHG: If you have a chlorhexidine allergy or have previously reacted to chlorhexidine. On babies younger than 42 months of age. How to use CHG solution Use CHG only as told by your health care provider, and follow the instructions on the label. Use the full amount of CHG as directed. Usually, this is one bottle. During a shower Follow these steps when using CHG solution during a shower (unless your health care provider gives you different instructions): Start the shower. Use your normal soap and shampoo to wash your face and hair. Turn off the shower or move out of the shower stream. Pour the CHG onto a clean washcloth. Do not use any type of brush or rough-edged sponge. Starting at your neck, lather your body down to your toes. Make sure you follow these instructions: If you will be having  surgery, pay special attention to the part of your body where you will be having surgery. Scrub this area for at least 1 minute. Do not use CHG on your head or face. If the solution gets into your ears or eyes, rinse  them well with water. Avoid your genital area. Avoid any areas of skin that have broken skin, cuts, or scrapes. Scrub your back and under your arms. Make sure to wash skin folds. Let the lather sit on your skin for 1-2 minutes or as long as told by your health care provider. Thoroughly rinse your entire body in the shower. Make sure that all body creases and crevices are rinsed well. Dry off with a clean towel. Do not put any substances on your body afterward--such as powder, lotion, or perfume--unless you are told to do so by your health care provider. Only use lotions that are recommended by the manufacturer. Put on clean clothes or pajamas. If it is the night before your surgery, sleep in clean sheets.  During a sponge bath Follow these steps when using CHG solution during a sponge bath (unless your health care provider gives you different instructions): Use your normal soap and shampoo to wash your face and hair. Pour the CHG onto a clean washcloth. Starting at your neck, lather your body down to your toes. Make sure you follow these instructions: If you will be having surgery, pay special attention to the part of your body where you will be having surgery. Scrub this area for at least 1 minute. Do not use CHG on your head or face. If the solution gets into your ears or eyes, rinse them well with water. Avoid your genital area. Avoid any areas of skin that have broken skin, cuts, or scrapes. Scrub your back and under your arms. Make sure to wash skin folds. Let the lather sit on your skin for 1-2 minutes or as long as told by your health care provider. Using a different clean, wet washcloth, thoroughly rinse your entire body. Make sure that all body creases and crevices are  rinsed well. Dry off with a clean towel. Do not put any substances on your body afterward--such as powder, lotion, or perfume--unless you are told to do so by your health care provider. Only use lotions that are recommended by the manufacturer. Put on clean clothes or pajamas. If it is the night before your surgery, sleep in clean sheets. How to use CHG prepackaged cloths Only use CHG cloths as told by your health care provider, and follow the instructions on the label. Use the CHG cloth on clean, dry skin. Do not use the CHG cloth on your head or face unless your health care provider tells you to. When washing with the CHG cloth: Avoid your genital area. Avoid any areas of skin that have broken skin, cuts, or scrapes. Before surgery Follow these steps when using a CHG cloth to clean before surgery (unless your health care provider gives you different instructions): Using the CHG cloth, vigorously scrub the part of your body where you will be having surgery. Scrub using a back-and-forth motion for 3 minutes. The area on your body should be completely wet with CHG when you are done scrubbing. Do not rinse. Discard the cloth and let the area air-dry. Do not put any substances on the area afterward, such as powder, lotion, or perfume. Put on clean clothes or pajamas. If it is the night before your surgery, sleep in clean sheets.  For general bathing Follow these steps when using CHG cloths for general bathing (unless your health care provider gives you different instructions). Use a separate CHG cloth for each area of your body. Make sure you wash between any folds of skin and between your  fingers and toes. Wash your body in the following order, switching to a new cloth after each step: The front of your neck, shoulders, and chest. Both of your arms, under your arms, and your hands. Your stomach and groin area, avoiding the genitals. Your right leg and foot. Your left leg and foot. The back of  your neck, your back, and your buttocks. Do not rinse. Discard the cloth and let the area air-dry. Do not put any substances on your body afterward--such as powder, lotion, or perfume--unless you are told to do so by your health care provider. Only use lotions that are recommended by the manufacturer. Put on clean clothes or pajamas. Contact a health care provider if: Your skin gets irritated after scrubbing. You have questions about using your solution or cloth. You swallow any chlorhexidine. Call your local poison control center (1-830-100-2868 in the [place] Get help right away if: Your eyes itch badly, or they become very red or swollen. Your skin itches badly and is red or swollen. Your hearing changes. You have trouble seeing. You have swelling or tingling in your mouth or throat. You have trouble breathing. These symptoms may represent a serious problem that is an emergency. Do not wait to see if the symptoms will go away. Get medical help right away. Call your local emergency services (911 in the [place] Do not drive yourself to the hospital. Summary Chlorhexidine gluconate (CHG) is a germ-killing (antiseptic) solution that is used to clean the skin. Cleaning your skin with CHG may help to lower your risk for infection. You may be given CHG to use for bathing. It may be in a bottle or in a prepackaged cloth to use on your skin. Carefully follow your health care provider's instructions and the instructions on the product label. Do not use CHG if you have a chlorhexidine allergy. Contact your health care provider if your skin gets irritated after scrubbing. This information is not intended to replace advice given to you by your health care provider. Make sure you discuss any questions you have with your health care provider. Document Revised: 12/16/2021 Document Reviewed: 10/29/2020 Elsevier Patient Education  [place].

## 2022-10-10 ENCOUNTER — Encounter (HOSPITAL_COMMUNITY)
Admission: RE | Admit: 2022-10-10 | Discharge: 2022-10-10 | Disposition: A | Discharge status: Home / Self Care | Payer: Medicare HMO | Source: Ambulatory Visit | Attending: Orthopedic Surgery | Admitting: Orthopedic Surgery

## 2022-10-10 ENCOUNTER — Encounter (HOSPITAL_COMMUNITY): Payer: Self-pay

## 2022-10-10 VITALS — BP 119/69 | HR 72 | Temp 97.8°F | Resp 18 | Ht 66.0 in | Wt 167.1 lb

## 2022-10-10 DIAGNOSIS — Z01812 Encounter for preprocedural laboratory examination: Secondary | ICD-10-CM | POA: Insufficient documentation

## 2022-10-10 DIAGNOSIS — M1711 Unilateral primary osteoarthritis, right knee: Secondary | ICD-10-CM

## 2022-10-10 DIAGNOSIS — R7303 Prediabetes: Secondary | ICD-10-CM | POA: Diagnosis not present

## 2022-10-10 DIAGNOSIS — Z01818 Encounter for other preprocedural examination: Secondary | ICD-10-CM

## 2022-10-10 LAB — CBC WITH DIFFERENTIAL/PLATELET
Abs Immature Granulocytes: 0.01 10*3/uL (ref 0.00–0.07)
Basophils Absolute: 0.1 10*3/uL (ref 0.0–0.1)
Basophils Relative: 2 %
Eosinophils Absolute: 0.3 10*3/uL (ref 0.0–0.5)
Eosinophils Relative: 7 %
HCT: 34.4 % — ABNORMAL LOW (ref 36.0–46.0)
Hemoglobin: 11 g/dL — ABNORMAL LOW (ref 12.0–15.0)
Immature Granulocytes: 0 %
Lymphocytes Relative: 26 %
Lymphs Abs: 1 10*3/uL (ref 0.7–4.0)
MCH: 27 pg (ref 26.0–34.0)
MCHC: 32 g/dL (ref 30.0–36.0)
MCV: 84.3 fL (ref 80.0–100.0)
Monocytes Absolute: 0.2 10*3/uL (ref 0.1–1.0)
Monocytes Relative: 6 %
Neutro Abs: 2.2 10*3/uL (ref 1.7–7.7)
Neutrophils Relative %: 59 %
Platelets: 273 10*3/uL (ref 150–400)
RBC: 4.08 MIL/uL (ref 3.87–5.11)
RDW: 15 % (ref 11.5–15.5)
WBC: 3.8 10*3/uL — ABNORMAL LOW (ref 4.0–10.5)
nRBC: 0 % (ref 0.0–0.2)

## 2022-10-10 LAB — BASIC METABOLIC PANEL
Anion gap: 9 (ref 5–15)
BUN: 17 mg/dL (ref 8–23)
CO2: 26 mmol/L (ref 22–32)
Calcium: 9 mg/dL (ref 8.9–10.3)
Chloride: 107 mmol/L (ref 98–111)
Creatinine, Ser: 0.71 mg/dL (ref 0.44–1.00)
GFR, Estimated: >60 mL/min (ref >60)
Glucose, Bld: 100 mg/dL — ABNORMAL HIGH (ref 70–99)
Potassium: 3.9 mmol/L (ref 3.5–5.1)
Sodium: 142 mmol/L (ref 135–145)

## 2022-10-10 LAB — PREPARE RBC (CROSSMATCH)

## 2022-10-10 LAB — SURGICAL PCR SCREEN
MRSA, PCR: NEGATIVE
Staphylococcus aureus: NEGATIVE

## 2022-10-10 LAB — HEMOGLOBIN A1C
Hgb A1c MFr Bld: 5.3 % (ref 4.8–5.6)
Mean Plasma Glucose: 105.41 mg/dL

## 2022-10-13 NOTE — H&P (Signed)
Chief Complaint  Patient presents with   Knee Pain      Right    Knee Problem      Wants to discuss tka right knee         Encounter Diagnoses  Name Primary?   Chronic pain of right knee     Primary osteoarthritis of right knee Yes    Tiffany Bradley has had a left total knee with a [employer] fixed-bearing posterior stabilized total knee.  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/28/2022   Small bowel obstruction ([place]) 12/13/2021   Hyperglycemia 12/13/2021   Unilateral primary osteoarthritis, left hip 12/12/2021   Viral upper respiratory tract infection 03/05/2021   History of retinal detachment 03/12/2020   Pure hypercholesterolemia 03/12/2020   PUD (peptic ulcer disease) 05/09/2019   OAB (overactive bladder) 05/09/2019   Lumbar back pain 05/09/2019   DDD (degenerative disc disease), lumbar 05/09/2019   Status post total knee replacement, 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 02/11/2018   Iron deficiency anemia 04/28/2017   Vitamin D deficiency 04/28/2017   Osteopenia of lumbar spine 04/28/2017   Gastritis, erosive     Abdominal pain, epigastric 12/12/2016   Colon cancer screening 11/03/2016   Essential hypertension 11/03/2016   Primary osteoarthritis of both knees 11/03/2016   GERD (gastroesophageal reflux disease) 11/03/2016   Age-related osteoporosis without current pathological fracture 11/03/2016   Perforation of sigmoid colon ([place]) 06/29/2016     Past Medical History:  Diagnosis Date   Arthritis    Back pain    Cataract    DJD (degenerative joint disease)    Full dentures    GERD (gastroesophageal reflux disease)    Glaucoma    History of bleeding ulcers    patient  reports multiple hospitalizations remotely   History of total left hip replacement    Hypertension    Pre-diabetes    Wears glasses    Past Surgical History:  Procedure Laterality Date   BREAST REDUCTION SURGERY Bilateral 04/25/2013   Procedure: MAMMARY REDUCTION  (BREAST);  Surgeon: Cristine Polio, MD;  Location: [place];  Service: Plastics;  Laterality: Bilateral;   COLONOSCOPY     2008, normal per patient   COLONOSCOPY N/A 01/16/2017   Procedure: COLONOSCOPY;  Surgeon: Danie Binder, MD;  [facility];  Service: Endoscopy;  Laterality: N/A;  830    COSMETIC SURGERY     ESOPHAGOGASTRODUODENOSCOPY N/A 01/16/2017   Procedure: ESOPHAGOGASTRODUODENOSCOPY (EGD);  Surgeon: Danie Binder, MD;  [facility]: [facility];  Service: Endoscopy;  Laterality: N/A;   PARTIAL COLECTOMY N/A 06/29/2016   Procedure: PARTIAL COLECTOMY;  Surgeon: Vickie Epley, MD;  Location: [facility];  Service: General;  Laterality: N/A;   REDUCTION MAMMAPLASTY     TOTAL HIP ARTHROPLASTY Left 02/28/2022   Procedure: LEFT TOTAL HIP ARTHROPLASTY ANTERIOR APPROACH;  Surgeon: Mcarthur Rossetti, MD;  Location: [facility];  Service: Orthopedics;  Laterality: Left;   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   Procedure: TOTAL KNEE ARTHROPLASTY;  Surgeon: Carole Civil, MD;  Location: [facility];  Service: Orthopedics;  Laterality: Left;  De[place]  TUBAL LIGATION     Family History  Problem Relation Age of Onset   Kidney disease Mother    Stroke Mother    Heart attack Father    Hyperlipidemia Sister    Hypertension Sister    Diabetes Sister    Hypertension Sister    Hyperlipidemia Sister    Hypertension Brother    Hyperlipidemia Brother    Colon cancer Neg Hx    Breast cancer Neg Hx    Social History   Tobacco Use   Smoking status: Former    Packs/day: 3.00    Types: Cigarettes    Quit date: 04/20/1993    Years since quitting: 29.5   Smokeless tobacco: Never  Vaping Use    Vaping Use: Never used  Substance Use Topics   Alcohol use: Not Currently   Drug use: No   Current Outpatient Medications  Medication Instructions   amLODipine (NORVASC) 10 mg, Oral, Daily   aspirin 325 mg, Oral, Daily   atorvastatin (LIPITOR) 20 mg, Oral, Daily   Biotin 10 MG CAPS 10 mg of amoxicillin, Oral, Daily   Calcium Carb-Cholecalciferol (CALCIUM 600+D3 PO) 1 tablet, Oral, Daily   diclofenac (VOLTAREN) 75 mg, Oral, 2 times daily PRN   docusate sodium (COLACE) 100 mg, Oral, 2 times daily   ferrous sulfate 325 mg, Oral, Daily   gabapentin (NEURONTIN) 300 mg, Oral, 2 times daily   Glucosamine HCl-MSM (GLUCOSAMINE-MSM PO) 2 tablets, Oral, Daily   mometasone (NASONEX) 50 MCG/ACT nasal spray Use 2 spray(s) in each nostril once daily   Multiple Vitamins-Minerals (MULTIVITAMIN WITH MINERALS) tablet 1 tablet, Oral, Daily, With Zinc   Multiple Vitamins-Minerals (WOMENS MULTIVITAMIN PO) 1 tablet, Oral, Daily, Chewable   omeprazole (PRILOSEC) 40 mg, Oral, Daily   polyethylene glycol (MIRALAX) 17 g, Oral, Daily   tiZANidine (ZANAFLEX) 4 mg, Oral, At bedtime PRN   tolterodine (DETROL LA) 2 mg, Oral, Daily   torsemide (DEMADEX) 20 mg, Oral, Daily   Vitamin D 2,000 Units, Oral, Daily    Physical Exam Vitals and nursing note reviewed.  Constitutional:      Appearance: Normal appearance.  HENT:     Head: Normocephalic and atraumatic.  Eyes:     General: No scleral icterus.       Right eye: No discharge.        Left eye: No discharge.     Extraocular Movements: Extraocular movements intact.     Conjunctiva/sclera: Conjunctivae normal.     Pupils: Pupils are equal, round, and reactive to light.  Cardiovascular:     Rate and Rhythm: Normal rate.     Pulses: Normal pulses.  Musculoskeletal:     Comments: Right knee has a slight flexion contracture less than 5 degrees.  Her flexion arc is 125 degrees.  She has a severe valgus alignment to the right knee on clinical exam.  There is no  instability or pseudolaxity.   Skin envelope is normal  Skin:    General: Skin is warm and dry.     Capillary Refill: Capillary refill takes less than 2 seconds.  Neurological:     General: No focal deficit present.     Mental Status: She is alert and oriented to person, place, and time.  Psychiatric:        Mood and Affect: Mood normal.        Behavior: Behavior normal.        Thought Content: Thought content normal.        Judgment:  Judgment normal.      Updated imaging reveals a 16 degree valgus deformity lateral compartment arthrosis minimal osteophytes   The procedure has been fully reviewed with the patient; The risks and benefits of surgery have been discussed and explained and understood. Alternative treatment has also been reviewed, questions were encouraged and answered. The postoperative plan is also been reviewed.  Plan is for a right total knee arthroplasty I will ask for enhanced ability implants from [employer]

## 2022-10-14 ENCOUNTER — Ambulatory Visit (HOSPITAL_COMMUNITY): Payer: Medicare HMO

## 2022-10-14 ENCOUNTER — Observation Stay (HOSPITAL_COMMUNITY)
Admission: RE | Admit: 2022-10-14 | Discharge: 2022-10-15 | Disposition: A | Discharge status: Home Health | Payer: Medicare HMO | Attending: Orthopedic Surgery | Admitting: Orthopedic Surgery

## 2022-10-14 ENCOUNTER — Encounter (HOSPITAL_COMMUNITY): Payer: Self-pay | Admitting: Orthopedic Surgery

## 2022-10-14 ENCOUNTER — Encounter (HOSPITAL_COMMUNITY)
Admission: RE | Disposition: A | Discharge status: Home Health | Payer: Self-pay | Source: Home / Self Care | Attending: Orthopedic Surgery

## 2022-10-14 ENCOUNTER — Other Ambulatory Visit: Payer: Self-pay

## 2022-10-14 ENCOUNTER — Ambulatory Visit (HOSPITAL_BASED_OUTPATIENT_CLINIC_OR_DEPARTMENT_OTHER): Payer: Medicare HMO | Admitting: Anesthesiology

## 2022-10-14 ENCOUNTER — Ambulatory Visit (HOSPITAL_COMMUNITY): Payer: Medicare HMO | Admitting: Anesthesiology

## 2022-10-14 DIAGNOSIS — M1711 Unilateral primary osteoarthritis, right knee: Secondary | ICD-10-CM

## 2022-10-14 DIAGNOSIS — Z96642 Presence of left artificial hip joint: Secondary | ICD-10-CM | POA: Diagnosis not present

## 2022-10-14 DIAGNOSIS — Z96652 Presence of left artificial knee joint: Secondary | ICD-10-CM | POA: Insufficient documentation

## 2022-10-14 DIAGNOSIS — I10 Essential (primary) hypertension: Secondary | ICD-10-CM | POA: Insufficient documentation

## 2022-10-14 DIAGNOSIS — Z471 Aftercare following joint replacement surgery: Secondary | ICD-10-CM | POA: Diagnosis not present

## 2022-10-14 DIAGNOSIS — Z79899 Other long term (current) drug therapy: Secondary | ICD-10-CM | POA: Diagnosis not present

## 2022-10-14 DIAGNOSIS — Z7982 Long term (current) use of aspirin: Secondary | ICD-10-CM | POA: Insufficient documentation

## 2022-10-14 DIAGNOSIS — M25461 Effusion, right knee: Secondary | ICD-10-CM | POA: Diagnosis not present

## 2022-10-14 DIAGNOSIS — Z96641 Presence of right artificial hip joint: Secondary | ICD-10-CM | POA: Diagnosis not present

## 2022-10-14 DIAGNOSIS — Z96651 Presence of right artificial knee joint: Secondary | ICD-10-CM | POA: Diagnosis not present

## 2022-10-14 DIAGNOSIS — Z87891 Personal history of nicotine dependence: Secondary | ICD-10-CM | POA: Diagnosis not present

## 2022-10-14 HISTORY — PX: TOTAL KNEE ARTHROPLASTY: SHX125

## 2022-10-14 HISTORY — DX: Unilateral primary osteoarthritis, right knee: M17.11

## 2022-10-14 LAB — TYPE AND SCREEN
ABO/RH(D): O POS
Antibody Screen: NEGATIVE
Unit division: 0
Unit division: 0

## 2022-10-14 LAB — BPAM RBC
Blood Product Expiration Date: 202403142359
Blood Product Expiration Date: 202403142359
Unit Type and Rh: 5100
Unit Type and Rh: 5100

## 2022-10-14 SURGERY — ARTHROPLASTY, KNEE, TOTAL
Anesthesia: General | Site: Knee | Laterality: Right

## 2022-10-14 MED ORDER — ASPIRIN 325 MG PO TABS
325.0000 mg | ORAL_TABLET | Freq: Every day | ORAL | Status: DC
  Administered 2022-10-15: 325 mg via ORAL
  Filled 2022-10-14: qty 1

## 2022-10-14 MED ORDER — DIPHENHYDRAMINE HCL 12.5 MG/5ML PO ELIX: 12.5000 mg | ORAL_SOLUTION | ORAL | Status: DC | PRN

## 2022-10-14 MED ORDER — PANTOPRAZOLE SODIUM 40 MG PO TBEC
40.0000 mg | DELAYED_RELEASE_TABLET | Freq: Every day | ORAL | Status: DC
  Administered 2022-10-15: 40 mg via ORAL
  Filled 2022-10-14: qty 1

## 2022-10-14 MED ORDER — EPHEDRINE SULFATE (PRESSORS) 50 MG/ML IJ SOLN
INTRAMUSCULAR | Status: DC | PRN
  Administered 2022-10-14: 5 mg via INTRAVENOUS

## 2022-10-14 MED ORDER — ONDANSETRON HCL 4 MG/2ML IJ SOLN
INTRAMUSCULAR | Status: DC | PRN
  Administered 2022-10-14: 4 mg via INTRAVENOUS

## 2022-10-14 MED ORDER — PROPOFOL 500 MG/50ML IV EMUL
INTRAVENOUS | Status: AC
  Filled 2022-10-14: qty 50

## 2022-10-14 MED ORDER — ONDANSETRON HCL 4 MG/2ML IJ SOLN
4.0000 mg | Freq: Once | INTRAMUSCULAR | Status: AC | PRN
  Administered 2022-10-14: 4 mg via INTRAVENOUS

## 2022-10-14 MED ORDER — BUPIVACAINE-MELOXICAM ER 200-6 MG/7ML IJ SOLN
INTRAMUSCULAR | Status: AC
  Filled 2022-10-14: qty 2

## 2022-10-14 MED ORDER — LACTATED RINGERS IV SOLN: INTRAVENOUS | Status: DC

## 2022-10-14 MED ORDER — FLUTICASONE PROPIONATE 50 MCG/ACT NA SUSP
1.0000 | Freq: Every day | NASAL | Status: DC
  Administered 2022-10-15: 1 via NASAL
  Filled 2022-10-14: qty 16

## 2022-10-14 MED ORDER — EPHEDRINE 5 MG/ML INJ
INTRAVENOUS | Status: AC
  Filled 2022-10-14: qty 5

## 2022-10-14 MED ORDER — OXYCODONE HCL 5 MG PO TABS: 5.0000 mg | ORAL_TABLET | Freq: Once | ORAL | Status: DC | PRN

## 2022-10-14 MED ORDER — ATORVASTATIN CALCIUM 20 MG PO TABS
20.0000 mg | ORAL_TABLET | Freq: Every day | ORAL | Status: DC
  Administered 2022-10-14 – 2022-10-15 (×2): 20 mg via ORAL
  Filled 2022-10-14 (×2): qty 1

## 2022-10-14 MED ORDER — TRANEXAMIC ACID-NACL 1000-0.7 MG/100ML-% IV SOLN
INTRAVENOUS | Status: AC
  Filled 2022-10-14: qty 100

## 2022-10-14 MED ORDER — ONDANSETRON HCL 4 MG/2ML IJ SOLN
INTRAMUSCULAR | Status: AC
  Filled 2022-10-14: qty 2

## 2022-10-14 MED ORDER — TRANEXAMIC ACID-NACL 1000-0.7 MG/100ML-% IV SOLN
1000.0000 mg | INTRAVENOUS | Status: AC
  Administered 2022-10-14: 1000 mg via INTRAVENOUS

## 2022-10-14 MED ORDER — HYDROCODONE-ACETAMINOPHEN 7.5-325 MG PO TABS: 1.0000 | ORAL_TABLET | ORAL | Status: DC | PRN

## 2022-10-14 MED ORDER — FENTANYL CITRATE PF 50 MCG/ML IJ SOSY
25.0000 ug | PREFILLED_SYRINGE | INTRAMUSCULAR | Status: DC | PRN
  Administered 2022-10-14 (×2): 50 ug via INTRAVENOUS

## 2022-10-14 MED ORDER — ROCURONIUM BROMIDE 10 MG/ML (PF) SYRINGE
PREFILLED_SYRINGE | INTRAVENOUS | Status: AC
  Filled 2022-10-14: qty 10

## 2022-10-14 MED ORDER — ALUM & MAG HYDROXIDE-SIMETH 200-200-20 MG/5ML PO SUSP: 30.0000 mL | ORAL | Status: DC | PRN

## 2022-10-14 MED ORDER — TRANEXAMIC ACID-NACL 1000-0.7 MG/100ML-% IV SOLN
1000.0000 mg | Freq: Once | INTRAVENOUS | Status: AC
  Administered 2022-10-14: 1000 mg via INTRAVENOUS
  Filled 2022-10-14: qty 100

## 2022-10-14 MED ORDER — STERILE WATER FOR IRRIGATION IR SOLN
Status: DC | PRN
  Administered 2022-10-14: 1000 mL

## 2022-10-14 MED ORDER — CEFAZOLIN SODIUM-DEXTROSE 2-4 GM/100ML-% IV SOLN
2.0000 g | Freq: Four times a day (QID) | INTRAVENOUS | Status: AC
  Administered 2022-10-14 (×2): 2 g via INTRAVENOUS
  Filled 2022-10-14 (×2): qty 100

## 2022-10-14 MED ORDER — ORAL CARE MOUTH RINSE: 15.0000 mL | Freq: Once | OROMUCOSAL | Status: AC

## 2022-10-14 MED ORDER — CHLORHEXIDINE GLUCONATE 0.12 % MT SOLN
15.0000 mL | Freq: Once | OROMUCOSAL | Status: AC
  Administered 2022-10-14: 15 mL via OROMUCOSAL

## 2022-10-14 MED ORDER — ROPIVACAINE HCL 5 MG/ML IJ SOLN
INTRAMUSCULAR | Status: AC
  Filled 2022-10-14: qty 30

## 2022-10-14 MED ORDER — FENTANYL CITRATE (PF) 100 MCG/2ML IJ SOLN
INTRAMUSCULAR | Status: AC
  Filled 2022-10-14: qty 2

## 2022-10-14 MED ORDER — MORPHINE SULFATE (PF) 2 MG/ML IV SOLN
0.5000 mg | INTRAVENOUS | Status: DC | PRN
  Filled 2022-10-14: qty 1

## 2022-10-14 MED ORDER — POVIDONE-IODINE 10 % EX SWAB: 2.0000 | Freq: Once | CUTANEOUS | Status: DC

## 2022-10-14 MED ORDER — METOCLOPRAMIDE HCL 10 MG PO TABS: 5.0000 mg | ORAL_TABLET | Freq: Three times a day (TID) | ORAL | Status: DC | PRN

## 2022-10-14 MED ORDER — CHLORHEXIDINE GLUCONATE 0.12 % MT SOLN
OROMUCOSAL | Status: AC
  Filled 2022-10-14: qty 15

## 2022-10-14 MED ORDER — ROCURONIUM BROMIDE 100 MG/10ML IV SOLN
INTRAVENOUS | Status: DC | PRN
  Administered 2022-10-14: 70 mg via INTRAVENOUS

## 2022-10-14 MED ORDER — ONDANSETRON HCL 4 MG/2ML IJ SOLN
4.0000 mg | Freq: Four times a day (QID) | INTRAMUSCULAR | Status: DC
  Administered 2022-10-14 – 2022-10-15 (×3): 4 mg via INTRAVENOUS
  Filled 2022-10-14 (×4): qty 2

## 2022-10-14 MED ORDER — BUPIVACAINE-MELOXICAM ER 200-6 MG/7ML IJ SOLN
INTRAMUSCULAR | Status: DC | PRN
  Administered 2022-10-14: 400 mg

## 2022-10-14 MED ORDER — LIDOCAINE HCL (CARDIAC) PF 50 MG/5ML IV SOSY
PREFILLED_SYRINGE | INTRAVENOUS | Status: DC | PRN
  Administered 2022-10-14: 60 mg via INTRAVENOUS

## 2022-10-14 MED ORDER — GABAPENTIN 300 MG PO CAPS
300.0000 mg | ORAL_CAPSULE | Freq: Two times a day (BID) | ORAL | Status: DC
  Administered 2022-10-14 – 2022-10-15 (×2): 300 mg via ORAL
  Filled 2022-10-14 (×2): qty 1

## 2022-10-14 MED ORDER — DEXAMETHASONE SODIUM PHOSPHATE 10 MG/ML IJ SOLN
10.0000 mg | Freq: Once | INTRAMUSCULAR | Status: AC
  Administered 2022-10-15: 10 mg via INTRAVENOUS
  Filled 2022-10-14: qty 1

## 2022-10-14 MED ORDER — PREGABALIN 50 MG PO CAPS
50.0000 mg | ORAL_CAPSULE | Freq: Three times a day (TID) | ORAL | Status: DC
  Administered 2022-10-14 – 2022-10-15 (×3): 50 mg via ORAL
  Filled 2022-10-14 (×3): qty 1

## 2022-10-14 MED ORDER — PREGABALIN 50 MG PO CAPS
ORAL_CAPSULE | ORAL | Status: AC
  Filled 2022-10-14: qty 1

## 2022-10-14 MED ORDER — METHOCARBAMOL 1000 MG/10ML IJ SOLN
500.0000 mg | Freq: Four times a day (QID) | INTRAVENOUS | Status: DC
  Administered 2022-10-14 – 2022-10-15 (×3): 500 mg via INTRAVENOUS
  Filled 2022-10-14 (×4): qty 5

## 2022-10-14 MED ORDER — ONDANSETRON HCL 4 MG/2ML IJ SOLN: 4.0000 mg | Freq: Four times a day (QID) | INTRAMUSCULAR | Status: DC | PRN

## 2022-10-14 MED ORDER — OXYCODONE HCL 5 MG/5ML PO SOLN: 5.0000 mg | Freq: Once | ORAL | Status: DC | PRN

## 2022-10-14 MED ORDER — HYDROCODONE-ACETAMINOPHEN 10-325 MG PO TABS: 1.0000 | ORAL_TABLET | ORAL | Status: DC | PRN

## 2022-10-14 MED ORDER — METHOCARBAMOL 500 MG PO TABS: 500.0000 mg | ORAL_TABLET | Freq: Four times a day (QID) | ORAL | Status: DC | PRN

## 2022-10-14 MED ORDER — SODIUM CHLORIDE 0.9 % IV SOLN: INTRAVENOUS | Status: DC

## 2022-10-14 MED ORDER — PHENYLEPHRINE 80 MCG/ML (10ML) SYRINGE FOR IV PUSH (FOR BLOOD PRESSURE SUPPORT)
PREFILLED_SYRINGE | INTRAVENOUS | Status: AC
  Filled 2022-10-14: qty 10

## 2022-10-14 MED ORDER — FENTANYL CITRATE (PF) 100 MCG/2ML IJ SOLN
INTRAMUSCULAR | Status: DC | PRN
  Administered 2022-10-14 (×4): 50 ug via INTRAVENOUS

## 2022-10-14 MED ORDER — PHENOL 1.4 % MT LIQD: 1.0000 | OROMUCOSAL | Status: DC | PRN

## 2022-10-14 MED ORDER — ACETAMINOPHEN 325 MG PO TABS: 325.0000 mg | ORAL_TABLET | Freq: Four times a day (QID) | ORAL | Status: DC | PRN

## 2022-10-14 MED ORDER — MENTHOL 3 MG MT LOZG: 1.0000 | LOZENGE | OROMUCOSAL | Status: DC | PRN

## 2022-10-14 MED ORDER — ONDANSETRON HCL 4 MG PO TABS: 4.0000 mg | ORAL_TABLET | Freq: Four times a day (QID) | ORAL | Status: DC | PRN

## 2022-10-14 MED ORDER — CEFAZOLIN SODIUM-DEXTROSE 2-4 GM/100ML-% IV SOLN
INTRAVENOUS | Status: AC
  Filled 2022-10-14: qty 100

## 2022-10-14 MED ORDER — DEXMEDETOMIDINE HCL IN NACL 80 MCG/20ML IV SOLN
INTRAVENOUS | Status: DC | PRN
  Administered 2022-10-14: 4 ug via BUCCAL
  Administered 2022-10-14: 8 ug via BUCCAL

## 2022-10-14 MED ORDER — PROPOFOL 10 MG/ML IV BOLUS
INTRAVENOUS | Status: DC | PRN
  Administered 2022-10-14: 130 mg via INTRAVENOUS

## 2022-10-14 MED ORDER — METOCLOPRAMIDE HCL 5 MG/ML IJ SOLN: 5.0000 mg | Freq: Three times a day (TID) | INTRAMUSCULAR | Status: DC | PRN

## 2022-10-14 MED ORDER — CEFAZOLIN SODIUM-DEXTROSE 2-4 GM/100ML-% IV SOLN
2.0000 g | INTRAVENOUS | Status: AC
  Administered 2022-10-14: 2 g via INTRAVENOUS

## 2022-10-14 MED ORDER — FENTANYL CITRATE PF 50 MCG/ML IJ SOSY
PREFILLED_SYRINGE | INTRAMUSCULAR | Status: AC
  Filled 2022-10-14: qty 1

## 2022-10-14 MED ORDER — CELECOXIB 100 MG PO CAPS
200.0000 mg | ORAL_CAPSULE | Freq: Every day | ORAL | Status: DC
  Administered 2022-10-15: 200 mg via ORAL
  Filled 2022-10-14: qty 2

## 2022-10-14 MED ORDER — SUGAMMADEX SODIUM 500 MG/5ML IV SOLN
INTRAVENOUS | Status: DC | PRN
  Administered 2022-10-14: 200 mg via INTRAVENOUS

## 2022-10-14 MED ORDER — PHENYLEPHRINE HCL (PRESSORS) 10 MG/ML IV SOLN
INTRAVENOUS | Status: DC | PRN
  Administered 2022-10-14 (×3): 80 ug via INTRAVENOUS

## 2022-10-14 MED ORDER — 0.9 % SODIUM CHLORIDE (POUR BTL) OPTIME
TOPICAL | Status: DC | PRN
  Administered 2022-10-14: 1000 mL

## 2022-10-14 MED ORDER — TORSEMIDE 20 MG PO TABS
20.0000 mg | ORAL_TABLET | Freq: Every day | ORAL | Status: DC
  Administered 2022-10-15: 20 mg via ORAL
  Filled 2022-10-14: qty 1

## 2022-10-14 MED ORDER — DOCUSATE SODIUM 100 MG PO CAPS
100.0000 mg | ORAL_CAPSULE | Freq: Two times a day (BID) | ORAL | Status: DC
  Administered 2022-10-14 – 2022-10-15 (×2): 100 mg via ORAL
  Filled 2022-10-14 (×2): qty 1

## 2022-10-14 MED ORDER — FESOTERODINE FUMARATE ER 4 MG PO TB24
4.0000 mg | ORAL_TABLET | Freq: Every day | ORAL | Status: DC
  Administered 2022-10-15: 4 mg via ORAL
  Filled 2022-10-14: qty 1

## 2022-10-14 MED ORDER — BIOTIN 10 MG PO CAPS: 10.0000 mg | ORAL_CAPSULE | Freq: Every day | ORAL | Status: DC

## 2022-10-14 MED ORDER — TRAMADOL HCL 50 MG PO TABS
50.0000 mg | ORAL_TABLET | Freq: Four times a day (QID) | ORAL | Status: DC
  Administered 2022-10-14 – 2022-10-15 (×4): 50 mg via ORAL
  Filled 2022-10-14 (×4): qty 1

## 2022-10-14 MED ORDER — SODIUM CHLORIDE 0.9 % IR SOLN
Status: DC | PRN
  Administered 2022-10-14: 3000 mL

## 2022-10-14 MED ORDER — METHOCARBAMOL 1000 MG/10ML IJ SOLN: 500.0000 mg | Freq: Four times a day (QID) | INTRAVENOUS | Status: DC | PRN

## 2022-10-14 MED ORDER — LIDOCAINE HCL (PF) 2 % IJ SOLN
INTRAMUSCULAR | Status: AC
  Filled 2022-10-14: qty 5

## 2022-10-14 MED ORDER — ACETAMINOPHEN 500 MG PO TABS
500.0000 mg | ORAL_TABLET | Freq: Four times a day (QID) | ORAL | Status: AC
  Administered 2022-10-14 – 2022-10-15 (×4): 500 mg via ORAL
  Filled 2022-10-14 (×4): qty 1

## 2022-10-14 MED ORDER — POLYETHYLENE GLYCOL 3350 17 G PO PACK
17.0000 g | PACK | Freq: Every day | ORAL | Status: DC
  Administered 2022-10-14 – 2022-10-15 (×2): 17 g via ORAL
  Filled 2022-10-14 (×2): qty 1

## 2022-10-14 MED ORDER — HYDROCODONE-ACETAMINOPHEN 5-325 MG PO TABS
1.0000 | ORAL_TABLET | ORAL | Status: DC | PRN
  Administered 2022-10-14: 2 via ORAL
  Filled 2022-10-14: qty 2

## 2022-10-14 MED ORDER — PROPOFOL 500 MG/50ML IV EMUL
INTRAVENOUS | Status: DC | PRN
  Administered 2022-10-14: 25 ug/kg/min via INTRAVENOUS

## 2022-10-14 SURGICAL SUPPLY — 60 items
ATTUNE MED DOME PAT 32 KNEE (Knees) IMPLANT
ATTUNE PS FEM RT SZ 4 CEM KNEE (Femur) IMPLANT
BANDAGE ESMARK 6X9 LF (GAUZE/BANDAGES/DRESSINGS) ×1 IMPLANT
BASEPLATE TIB CMT FB PCKT SZ4 (Stem) IMPLANT
BLADE SAGITTAL 25.0X1.27X90 (BLADE) ×1 IMPLANT
BLADE SAW SGTL 11.0X1.19X90.0M (BLADE) ×1 IMPLANT
BLADE SURG SZ10 CARB STEEL (BLADE) ×1 IMPLANT
BNDG CMPR 9X6 STRL LF SNTH (GAUZE/BANDAGES/DRESSINGS) ×1
BNDG ESMARK 6X9 LF (GAUZE/BANDAGES/DRESSINGS) ×1
BSPLAT TIB 4 CMNT FX BRNG STRL (Stem) ×1 IMPLANT
CEMENT HV SMART SET (Cement) ×2 IMPLANT
CLOTH BEACON ORANGE TIMEOUT ST (SAFETY) ×1 IMPLANT
COOLER ICEMAN CLASSIC (MISCELLANEOUS) ×1 IMPLANT
COVER LIGHT HANDLE STERIS (MISCELLANEOUS) ×2 IMPLANT
CUFF TOURN SGL QUICK 34 (TOURNIQUET CUFF) ×1
CUFF TRNQT CYL 34X4.125X (TOURNIQUET CUFF) ×1 IMPLANT
DRAPE BACK TABLE (DRAPES) ×1 IMPLANT
DRAPE EXTREMITY T 121X128X90 (DISPOSABLE) ×1 IMPLANT
DRESSING AQUACEL AG ADV 3.5X12 (MISCELLANEOUS) ×1 IMPLANT
DRSG AQUACEL AG ADV 3.5X12 (MISCELLANEOUS) ×1
DURAPREP 26ML APPLICATOR (WOUND CARE) ×2 IMPLANT
ELECT REM PT RETURN 9FT ADLT (ELECTROSURGICAL) ×1
ELECTRODE REM PT RTRN 9FT ADLT (ELECTROSURGICAL) ×1 IMPLANT
GLOVE BIO SURGEON STRL SZ7 (GLOVE) IMPLANT
GLOVE BIOGEL PI IND STRL 6 (GLOVE) IMPLANT
GLOVE BIOGEL PI IND STRL 6.5 (GLOVE) IMPLANT
GLOVE BIOGEL PI IND STRL 7.0 (GLOVE) ×3 IMPLANT
GLOVE BIOGEL PI IND STRL 8.5 (GLOVE) ×1 IMPLANT
GLOVE ECLIPSE 6.5 STRL STRAW (GLOVE) IMPLANT
GLOVE SKINSENSE STRL SZ8.0 LF (GLOVE) ×1 IMPLANT
GOWN STRL REUS W/TWL LRG LVL3 (GOWN DISPOSABLE) ×3 IMPLANT
GOWN STRL REUS W/TWL XL LVL3 (GOWN DISPOSABLE) ×1 IMPLANT
HANDPIECE INTERPULSE COAX TIP (DISPOSABLE) ×1
HOOD W/PEELAWAY (MISCELLANEOUS) ×4 IMPLANT
INSERT TIBIAL ATTUNE POST 10MM (Knees) IMPLANT
INST SET MAJOR BONE (KITS) ×1 IMPLANT
IV NS IRRIG 3000ML ARTHROMATIC (IV SOLUTION) ×1 IMPLANT
KIT BLADEGUARD II DBL (SET/KITS/TRAYS/PACK) ×1 IMPLANT
KIT TURNOVER KIT A (KITS) ×1 IMPLANT
MANIFOLD NEPTUNE II (INSTRUMENTS) ×1 IMPLANT
MARKER SKIN DUAL TIP RULER LAB (MISCELLANEOUS) ×1 IMPLANT
NS IRRIG 1000ML POUR BTL (IV SOLUTION) ×1 IMPLANT
PACK TOTAL JOINT (CUSTOM PROCEDURE TRAY) ×1 IMPLANT
PAD ARMBOARD 7.5X6 YLW CONV (MISCELLANEOUS) ×1 IMPLANT
PAD COLD SHLDR SM WRAP-ON (PAD) ×1 IMPLANT
PILLOW KNEE EXTENSION 0 DEG (MISCELLANEOUS) ×1 IMPLANT
SAW OSC TIP CART 19.5X105X1.3 (SAW) ×1 IMPLANT
SET BASIN LINEN APH (SET/KITS/TRAYS/PACK) ×1 IMPLANT
SET HNDPC FAN SPRY TIP SCT (DISPOSABLE) ×1 IMPLANT
STAPLER VISISTAT 35W (STAPLE) ×1 IMPLANT
SUT BRALON NAB BRD #1 30IN (SUTURE) ×1 IMPLANT
SUT MNCRL 0 VIOLET CTX 36 (SUTURE) ×1 IMPLANT
SUT MON AB 0 CT1 (SUTURE) ×1 IMPLANT
SUT MONOCRYL 0 CTX 36 (SUTURE) ×2
SYR BULB IRRIG 60ML STRL (SYRINGE) ×1 IMPLANT
TOWEL OR 17X26 4PK STRL BLUE (TOWEL DISPOSABLE) ×1 IMPLANT
TOWER CARTRIDGE SMART MIX (DISPOSABLE) ×1 IMPLANT
TRAY FOLEY MTR SLVR 16FR STAT (SET/KITS/TRAYS/PACK) ×1 IMPLANT
WATER STERILE IRR 1000ML POUR (IV SOLUTION) ×2 IMPLANT
YANKAUER SUCT 12FT TUBE ARGYLE (SUCTIONS) ×1 IMPLANT

## 2022-10-14 NOTE — Progress Notes (Signed)
Instructed on incentive spirometer. 1500 ml obtained. Tolerated well. 

## 2022-10-14 NOTE — Anesthesia Procedure Notes (Signed)
Procedure Name: Intubation Date/Time: 10/14/2022 11:03 AM  Performed by: Vista Deck, CRNAPre-anesthesia Checklist: Patient identified, Patient being monitored, Timeout performed, Emergency Drugs available and Suction available Patient Re-evaluated:Patient Re-evaluated prior to induction Oxygen Delivery Method: Circle system utilized Preoxygenation: Pre-oxygenation with 100% oxygen Induction Type: IV induction Ventilation: Mask ventilation without difficulty Laryngoscope Size: Mac and 3 Grade View: Grade I Tube type: Oral Tube size: 7.0 mm Number of attempts: 1 Airway Equipment and Method: Stylet Placement Confirmation: ETT inserted through vocal cords under direct vision, positive ETCO2 and breath sounds checked- equal and bilateral Secured at: 21 cm Tube secured with: Tape Dental Injury: Teeth and Oropharynx as per pre-operative assessment

## 2022-10-14 NOTE — Interval H&P Note (Signed)
History and Physical Interval Note:  10/14/2022 10:11 AM  Tiffany Bradley  has presented today for surgery, with the diagnosis of right knee osteoarthritis.  The various methods of treatment have been discussed with the patient and family. After consideration of risks, benefits and other options for treatment, the patient has consented to  Procedure(s): TOTAL KNEE ARTHROPLASTY (Right) as a surgical intervention.  The patient's history has been reviewed, patient examined, no change in status, stable for surgery.  I have reviewed the patient's chart and labs.  Questions were answered to the patient's satisfaction.     Arther Abbott

## 2022-10-14 NOTE — Progress Notes (Signed)
Patient requested to get back to bed immediately after therapy left. Was encouraged to sit up a little longer, but ultimately myself and primary nurse assisted her back to bed. Patient ice was placed back on, and scd to left leg. Awaiting pump from materials. Patient daughter is at bedside.

## 2022-10-14 NOTE — Plan of Care (Signed)
  Problem: Acute Rehab PT Goals(only PT should resolve) Goal: Pt Will Go Supine/Side To Sit Outcome: Progressing Flowsheets (Taken 10/14/2022 1556) Pt will go Supine/Side to Sit:  with supervision  with min guard assist Goal: Patient Will Transfer Sit To/From Stand Outcome: Progressing Flowsheets (Taken 10/14/2022 1556) Patient will transfer sit to/from stand:  with min guard assist  with supervision Goal: Pt Will Transfer Bed To Chair/Chair To Bed Outcome: Progressing Flowsheets (Taken 10/14/2022 1556) Pt will Transfer Bed to Chair/Chair to Bed:  with supervision  min guard assist Goal: Pt Will Ambulate Outcome: Progressing Flowsheets (Taken 10/14/2022 1556) Pt will Ambulate:  75 feet  with min guard assist  with minimal assist  with rolling walker   3:57 PM, 10/14/22 Lonell Grandchild, MPT Physical Therapist with [facility] [phone] office [phone] mobile phone

## 2022-10-14 NOTE — Brief Op Note (Signed)
10/14/2022  1:00 PM  PATIENT:  Tiffany Bradley  77 y.o. female  PRE-OPERATIVE DIAGNOSIS:  right knee osteoarthritis  POST-OPERATIVE DIAGNOSIS:  right knee osteoarthritis  PROCEDURE:  Procedure(s): TOTAL KNEE ARTHROPLASTY (Right)  SURGEON:  Surgeon(s) and Role:    Carole Civil, MD - Primary  PHYSICIAN ASSISTANT:   ASSISTANTS: nicki cox   ANESTHESIA:   general  EBL:  10 mL   BLOOD ADMINISTERED:none  DRAINS: none   LOCAL MEDICATIONS USED:  OTHER zinrelef  SPECIMEN:  No Specimen  DISPOSITION OF SPECIMEN:  N/A  COUNTS:  YES  TOURNIQUET:   Total Tourniquet Time Documented: Thigh (Right) - 86 minutes Total: Thigh (Right) - 86 minutes   DICTATION: .Viviann Spare Dictation  PLAN OF CARE: Admit for overnight observation  PATIENT DISPOSITION:  PACU - hemodynamically stable.   Delay start of Pharmacological VTE agent (>24hrs) due to surgical blood loss or risk of bleeding: not applicable

## 2022-10-14 NOTE — Evaluation (Signed)
Physical Therapy Evaluation Patient Details Name: Tiffany Bradley MRN: [id number] DOB: 09-Sep-1945 Today's Date: 10/14/2022   RIGHT KNEE ROM:  0 - 90 degrees AMBULATION DISTANCE: 18 feet using RW with Min/Mod assist   History of Present Illness  Tiffany Bradley is a 77 y/o female, s/p Right TKA on 10/14/22 with the diagnosis of right knee osteoarthritis.  Clinical Impression  Patient demonstrates slow labored movement for sitting up at bedside requiring assistance for propping up on elbows and move RLE due to increased pain, able to take a few steps in room with fair carryover for right heel to toe stepping and limited for ambulation mostly due to c/o dizziness and right knee pain.  Patient tolerated sitting up in chair with right knee dangling after therapy her daughter and nurse in room.  Patient will benefit from continued skilled physical therapy in hospital and recommended venue below to increase strength, balance, endurance for safe ADLs and gait.          Recommendations for follow up therapy are one component of a multi-disciplinary discharge planning process, led by the attending physician.  Recommendations may be updated based on patient status, additional functional criteria and insurance authorization.  Follow Up Recommendations Home health PT      Assistance Recommended at Discharge Set up Supervision/Assistance  Patient can return home with the following  A lot of help with walking and/or transfers;A little help with bathing/dressing/bathroom;Help with stairs or ramp for entrance;Assistance with cooking/housework    Equipment Recommendations None recommended by PT  Recommendations for Other Services       Functional Status Assessment Patient has had a recent decline in their functional status and demonstrates the ability to make significant improvements in function in a reasonable and predictable amount of time.     Precautions / Restrictions Precautions Precautions:  Fall Restrictions Weight Bearing Restrictions: Yes RLE Weight Bearing: Weight bearing as tolerated      Mobility  Bed Mobility Overal bed mobility: Needs Assistance Bed Mobility: Supine to Sit     Supine to sit: Min assist, Mod assist     General bed mobility comments: increased time, labored movement    Transfers Overall transfer level: Needs assistance Equipment used: Rolling walker (2 wheels) Transfers: Sit to/from Stand, Bed to chair/wheelchair/BSC Sit to Stand: Min assist   Step pivot transfers: Min assist, Mod assist       General transfer comment: slow labored movement, limited weightbearing on RLE due to increased knee pain    Ambulation/Gait Ambulation/Gait assistance: Min assist, Mod assist Gait Distance (Feet): 18 Feet Assistive device: Rolling walker (2 wheels) Gait Pattern/deviations: Decreased step length - right, Decreased step length - left, Decreased stride length, Decreased stance time - right, Antalgic Gait velocity: slow     General Gait Details: unsteady labored cadence with fair carryover for right heel to toe stepping, limited mostly due to c/o fatigue, right knee pain and dizziness  Stairs            Wheelchair Mobility    Modified Rankin (Stroke Patients Only)       Balance Overall balance assessment: Needs assistance Sitting-balance support: Feet supported, No upper extremity supported Sitting balance-Leahy Scale: Fair Sitting balance - Comments: fair/good seated at EOB   Standing balance support: During functional activity, Bilateral upper extremity supported Standing balance-Leahy Scale: Poor Standing balance comment: fair/poor using RW  Pertinent Vitals/Pain Pain Assessment Pain Assessment: Faces Faces Pain Scale: Hurts even more Pain Location: right knee Pain Descriptors / Indicators: Grimacing, Guarding, Sore, Discomfort Pain Intervention(s): Limited activity within patient's  tolerance, Monitored during session, Patient requesting pain meds-RN notified    Home Living Family/patient expects to be discharged to:: Private residence Living Arrangements: Children Available Help at Discharge: Family;Available 24 hours/day Type of Home: House Home Access: Stairs to enter Entrance Stairs-Rails: Right;Left;Can reach both Entrance Stairs-Number of Steps: 3 Alternate Level Stairs-Number of Steps: 3 Home Layout: Multi-level;Able to live on main level with bedroom/bathroom;Full bath on main level Home Equipment: Cane - single Barista (2 wheels)      Prior Function Prior Level of Function : Independent/Modified Independent             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 ADLs Comments: Indpendent     Hand Dominance   Dominant Hand: Right    Extremity/Trunk Assessment   Upper Extremity Assessment Upper Extremity Assessment: Overall WFL for tasks assessed    Lower Extremity Assessment Lower Extremity Assessment: Generalized weakness;RLE deficits/detail RLE Deficits / Details: grossly -4/5 RLE: Unable to fully assess due to pain RLE Sensation: WNL RLE Coordination: WNL    Cervical / Trunk Assessment Cervical / Trunk Assessment: Normal  Communication   Communication: No difficulties  Cognition Arousal/Alertness: Awake/alert Behavior During Therapy: WFL for tasks assessed/performed Overall Cognitive Status: Within Functional Limits for tasks assessed                                          General Comments      Exercises Total Joint Exercises Ankle Circles/Pumps: Supine, 10 reps, Right, Strengthening, AROM Quad Sets: AROM, Strengthening, Right, 10 reps, Supine Short Arc Quad: Strengthening, Right, 10 reps, Supine, AAROM Heel Slides: AROM, Strengthening, Right, 10 reps, Supine Goniometric ROM: right knee ROM: 0 - 90 degrees   Assessment/Plan    PT Assessment Patient needs continued PT services   PT Problem List Decreased strength;Decreased range of motion;Decreased activity tolerance;Decreased balance;Decreased mobility       PT Treatment Interventions DME instruction;Gait training;Stair training;Functional mobility training;Therapeutic activities;Therapeutic exercise;Patient/family education;Balance training    PT Goals (Current goals can be found in the Care Plan section)  Acute Rehab PT Goals Patient Stated Goal: return home with family to assist PT Goal Formulation: With patient/family Time For Goal Achievement: 10/16/22 Potential to Achieve Goals: Good    Frequency BID     Co-evaluation               AM-PAC PT "6 Clicks" Mobility  Outcome Measure Help needed turning from your back to your side while in a flat bed without using bedrails?: A Little Help needed moving from lying on your back to sitting on the side of a flat bed without using bedrails?: A Lot Help needed moving to and from a bed to a chair (including a wheelchair)?: A Lot Help needed standing up from a chair using your arms (e.g., wheelchair or bedside chair)?: A Little Help needed to walk in hospital room?: A Lot Help needed climbing 3-5 steps with a railing? : A Lot 6 Click Score: 14    End of Session   Activity Tolerance: Patient tolerated treatment well;Patient limited by fatigue Patient left: in chair;with call bell/phone within reach;with family/visitor present Nurse Communication: Mobility status PT Visit Diagnosis: Unsteadiness on feet (R26.81);Other  abnormalities of gait and mobility (R26.89);Muscle weakness (generalized) (M62.81);Pain Pain - Right/Left: Right Pain - part of body: Knee    Time: [id number] PT Time Calculation (min) (ACUTE ONLY): 27 min   Charges:   PT Evaluation $PT Eval Moderate Complexity: 1 Mod PT Treatments $Therapeutic Activity: 23-37 mins        3:55 PM, 10/14/22 Lonell Grandchild, MPT Physical Therapist with [facility] [phone]  office [phone] mobile phone

## 2022-10-14 NOTE — Anesthesia Preprocedure Evaluation (Signed)
Anesthesia Evaluation  Patient identified by MRN, date of birth, ID band Patient awake    Reviewed: Allergy & Precautions, H&P , NPO status , Patient's Chart, lab work & pertinent test results, reviewed documented beta blocker date and time   Airway Mallampati: II  TM Distance: >3 FB Neck ROM: full    Dental no notable dental hx.    Pulmonary neg pulmonary ROS, former smoker   Pulmonary exam normal breath sounds clear to auscultation       Cardiovascular Exercise Tolerance: Good hypertension, negative cardio ROS  Rhythm:regular Rate:Normal     Neuro/Psych negative neurological ROS  negative psych ROS   GI/Hepatic negative GI ROS, Neg liver ROS, PUD,GERD  ,,  Endo/Other  negative endocrine ROS    Renal/GU negative Renal ROS  negative genitourinary   Musculoskeletal   Abdominal   Peds  Hematology negative hematology ROS (+) Blood dyscrasia, anemia   Anesthesia Other Findings   Reproductive/Obstetrics negative OB ROS                             Anesthesia Physical Anesthesia Plan  ASA: 2  Anesthesia Plan: Spinal   Post-op Pain Management: Regional block*   Induction:   PONV Risk Score and Plan: Propofol infusion  Airway Management Planned:   Additional Equipment:   Intra-op Plan:   Post-operative Plan:   Informed Consent: I have reviewed the patients History and Physical, chart, labs and discussed the procedure including the risks, benefits and alternatives for the proposed anesthesia with the patient or authorized representative who has indicated his/her understanding and acceptance.     Dental Advisory Given  Plan Discussed with: CRNA  Anesthesia Plan Comments:        Anesthesia Quick Evaluation

## 2022-10-14 NOTE — Transfer of Care (Signed)
Immediate Anesthesia Transfer of Care Note  [name]  Procedure(s) Performed: TOTAL KNEE ARTHROPLASTY (Right: Knee)  Patient Location: PACU  Anesthesia Type:General  Level of Consciousness: awake and patient cooperative  Airway & Oxygen Therapy: Patient Spontanous Breathing  Post-op Assessment: Report given to RN and Post -op Vital signs reviewed and stable  Post vital signs: Reviewed and stable  Last Vitals:  Vitals Value Taken Time  BP 139/86 10/14/22 1304  Temp 97.5 1309  Pulse 79 10/14/22 1308  Resp 20 10/14/22 1308  SpO2 96 % 10/14/22 1308  Vitals shown include unvalidated device data.  Last Pain:  Vitals:   10/14/22 0903  PainSc: 8          Complications: No notable events documented.

## 2022-10-14 NOTE — Op Note (Signed)
Orthopaedic Surgery Operative Note (CSN: [id number])  Tiffany Bradley  [birth year] Date of Surgery: 10/14/2022   Diagnoses:  right knee osteoarthritis  Procedure: RIGHT TOTAL KNEE ARTHROPLASTY   Operative Finding Successful completion of the planned procedure.   Severe osteoarthritis of the right knee.  Grade 4 chondral loss on the lateral femoral condyle with marbled bone appearance.  Multiple osteophytes medially and laterally.  ACL PCL medial lateral menisci were intact.  Chondral changes were noted on the patella lateral tibial plateau medial tibial plateau with grade 4 disease  There was a valgus deformity which was not a fixed contracture   Post-Op Diagnosis: Same Surgeons:Primary: Tiffany Civil, MD Assistants: Sherron Flemings [facility]  Location: [facility] OR ROOM 4 Anesthesia: General Antibiotics: Ancef 2 g Tourniquet time:  Total Tourniquet Time Documented: Thigh (Right) - 86 minutes Total: Thigh (Right) - 86 minutes  Estimated Blood Loss: [id number] Complications: None Specimens: None Implants: Implant Name Type Inv. Item Serial No. Manufacturer Lot No. LRB No. Used Action  CEMENT HV SMART SET - [id number] Cement CEMENT HV SMART SET  [employer] [id number] Right 1 Implanted  CEMENT HV SMART SET - [id number] Cement CEMENT HV SMART SET  [employer] [id number] Right 1 Implanted  BASEPLATE TIB CMT FB PCKT SZ4 - [id number] Stem BASEPLATE TIB CMT FB PCKT SZ4  [employer] [id number] Right 1 Implanted  ATTUNE PS FEM RT SZ 4 CEM KNEE - [id number] Femur ATTUNE PS FEM RT SZ 4 CEM KNEE  [employer]TH[employer] [id number] Right 1 Implanted  ATTUNE MED DOME PAT 32 KNEE - [id number] Knees ATTUNE MED DOME PAT 32 KNEE  [employer]TH[employer] [id number] Right 1 Implanted  ATTUNE TIBIAL INSERT FIXED BEARING POSTERIOR STABILIZED SIZE 4 53m AOX    [employer]PU[employer] ORTH[employer] [id number] 1 Implanted    Indications for Surgery:   Tiffany KNEEDLERis a 77y.o. female who presented for right total knee arthroplasty after she  failed to improve with nonoperative measures..  Benefits and risks of operative and nonoperative management were discussed prior to surgery with patient/guardian(s) and informed consent form was completed.  Specific risks including infection, need for additional surgery, deep vein thrombosis, pulmonary embolus, stiffness, postop pain, loosening of the implants.   Procedure:   The patient was identified properly. Informed consent was obtained and the surgical site was marked. The patient was taken to the OR where a spinal anesthetic was attempted but was unsuccessful therefore general anesthesia was induced.  The patient was positioned supine.  A Foley catheter was inserted..  The right lower extremity was prepped and draped in the usual sterile fashion.  Timeout was performed before the beginning of the case.   Tourniquet was used for the above duration.  The limb was then exsanguinated with a 6 inch Esmarch the tourniquet was elevated to 300 mmHg  A midline incision was made with the knee in flexion.  Subcutaneous tissue was divided down to the extensor mechanism.  A medial arthrotomy was performed  The patella was everted the fat pad was excised.  Osteophytes were removed from the femur and tibia.  The anterior horns of the medial lateral menisci were excised.  The ACL and PCL was excised.  The canal was entered with a drill bit and decompressed with suction and irrigation.  The femoral external alignment guide was set for a right knee 4 degree valgus cut and 9 mm resection.  Upon beginning the distal femoral cut the lateral condyle was deficient and was not in the saw  cut so the femoral block was placed so an additional 2 mm of bone could be resected.  We then measured the femur to a size 4.  We placed the femoral block and 5 degrees external rotation to match the patient's anatomy and the blocks in place and then posterior rise the blocks 1.5 mm using a angel wing to prevent and checked for  notching.  4 distal femoral cuts were made the posterior compartment was completely cleaned of any osteophytes.  We then turned our attention to the tibia.  We used an external alignment guide.  Tibial spines medial third of the tibial tubercle tibial crest were used for anatomic landmarks.  We referenced the cut from the higher medial side with a 2 mm resection planned.  Once this resection was completed we measured the tibia to be a size 4  Spacer blocks were then used to check for stability in flexion and extension.  Spacer blocks were inserted in extension and the lateral side was tight therefore the lamina spreaders were placed and piecrust technique was used to release the lateral side.  I was then able to put in a 10 mm spacer block.  This was then checked in flexion and found to be stable.  The femoral cut for the notch was then made within the notch guide.  Trial reduction was performed with a 10 mm spacer.  We used a 4 femur 4 tibia.  Passive flexion was 115 degrees.  The knee came to full extension  The lug holes were made for the femur the tibia was prepared.  The patella measured 21 mm in thickness we removed 10 mm of bone and then placed a 9 x 32 tibial button template and drilled 3 peg holes  The knee was then irrigated while cement was prepared on the back table.  We then cemented all components in place and removed excess cement and repeated the trial reduction.  Passive flexion again measured 115 degrees and he came to full extension we were stable in flexion and extension.  The knee was then irrigated any residual cement was excised and removed.  The knee was closed with #1 [name] in interrupted fashion inserting Zynrelef 2 vials.  Subcutaneous tissue was closed with 0 Monocryl in interrupted fashion and skin staples were used to reapproximate the skin edges  The sterile dressing and Cryo/Cuff were applied along with the TED hose and the patient was taken recovery room after  extubation in stable condition  Post-operative plan:   Weightbearing as tolerated  DVT prophylaxis for 35 days  Immediate range of motion  Follow-up in 2 weeks

## 2022-10-14 NOTE — Anesthesia Procedure Notes (Signed)
Spinal  Start time: 10/14/2022 10:37 AM End time: 10/14/2022 10:57 AM Staffing Anesthesiologist: Louann Sjogren, MD Resident/CRNA: Vista Deck, CRNA Performed by: Vista Deck, CRNA Authorized by: Louann Sjogren, MD   Spinal Block Patient position: sitting Prep: Betadine Patient monitoring: heart rate, cardiac monitor, continuous pulse ox and blood pressure Approach: midline Location: L3-4 Injection technique: single-shot Needle Needle gauge: 24 G Needle length: 9 cm Additional Notes 3 attempts made by Drucie Opitz CRNA with [place]. Unsuccessful. Dr. Briant Cedar in. Attempted insertion with 24 Pencan and 22 Sprotte. Unsuccessful. Proceeded with general anesthesia.

## 2022-10-15 ENCOUNTER — Telehealth: Payer: Self-pay | Admitting: Orthopedic Surgery

## 2022-10-15 DIAGNOSIS — Z79899 Other long term (current) drug therapy: Secondary | ICD-10-CM | POA: Diagnosis not present

## 2022-10-15 DIAGNOSIS — Z87891 Personal history of nicotine dependence: Secondary | ICD-10-CM | POA: Diagnosis not present

## 2022-10-15 DIAGNOSIS — Z96651 Presence of right artificial knee joint: Secondary | ICD-10-CM | POA: Diagnosis not present

## 2022-10-15 DIAGNOSIS — M1711 Unilateral primary osteoarthritis, right knee: Secondary | ICD-10-CM | POA: Diagnosis not present

## 2022-10-15 DIAGNOSIS — Z7982 Long term (current) use of aspirin: Secondary | ICD-10-CM | POA: Diagnosis not present

## 2022-10-15 DIAGNOSIS — Z96642 Presence of left artificial hip joint: Secondary | ICD-10-CM | POA: Diagnosis not present

## 2022-10-15 DIAGNOSIS — Z96652 Presence of left artificial knee joint: Secondary | ICD-10-CM | POA: Diagnosis not present

## 2022-10-15 DIAGNOSIS — I10 Essential (primary) hypertension: Secondary | ICD-10-CM | POA: Diagnosis not present

## 2022-10-15 LAB — BASIC METABOLIC PANEL WITH GFR
Anion gap: 6 (ref 5–15)
BUN: 7 mg/dL — ABNORMAL LOW (ref 8–23)
CO2: 26 mmol/L (ref 22–32)
Calcium: 8 mg/dL — ABNORMAL LOW (ref 8.9–10.3)
Chloride: 103 mmol/L (ref 98–111)
Creatinine, Ser: 0.59 mg/dL (ref 0.44–1.00)
GFR, Estimated: >60 mL/min
Glucose, Bld: 109 mg/dL — ABNORMAL HIGH (ref 70–99)
Potassium: 3.8 mmol/L (ref 3.5–5.1)
Sodium: 135 mmol/L (ref 135–145)

## 2022-10-15 LAB — CBC
HCT: 28 % — ABNORMAL LOW (ref 36.0–46.0)
Hemoglobin: 9 g/dL — ABNORMAL LOW (ref 12.0–15.0)
MCH: 26.9 pg (ref 26.0–34.0)
MCHC: 32.1 g/dL (ref 30.0–36.0)
MCV: 83.8 fL (ref 80.0–100.0)
Platelets: 226 10*3/uL (ref 150–400)
RBC: 3.34 MIL/uL — ABNORMAL LOW (ref 3.87–5.11)
RDW: 14.8 % (ref 11.5–15.5)
WBC: 7.5 10*3/uL (ref 4.0–10.5)
nRBC: 0 % (ref 0.0–0.2)

## 2022-10-15 MED ORDER — HYDROCODONE-ACETAMINOPHEN 10-325 MG PO TABS: 1.0000 | ORAL_TABLET | Freq: Three times a day (TID) | ORAL | 0 refills | Status: DC | PRN

## 2022-10-15 MED ORDER — TRAMADOL HCL 50 MG PO TABS: 50.0000 mg | ORAL_TABLET | Freq: Four times a day (QID) | ORAL | 0 refills | Status: DC

## 2022-10-15 MED ORDER — METHOCARBAMOL 1000 MG/10ML IJ SOLN
500.0000 mg | Freq: Four times a day (QID) | INTRAVENOUS | Status: DC
  Administered 2022-10-15: 500 mg via INTRAVENOUS
  Filled 2022-10-15: qty 500
  Filled 2022-10-15 (×4): qty 5

## 2022-10-15 NOTE — Progress Notes (Signed)
Physical Therapy Treatment Patient Details Name: Tiffany Bradley MRN: [id number] DOB: 27-Jan-1946 Today's Date: 10/15/2022   RIGHT KNEE ROM:  0 - 100 degrees AMBULATION DISTANCE: 150 feet using RW with Supervision   History of Present Illness Tiffany Bradley is a 77 y/o female, s/p Right TKA on 10/14/22 with the diagnosis of right knee osteoarthritis.    PT Comments    Patient demonstrates increased endurance, distance for gait training with fair/good return for right heel to toe stepping, fair/good return for going up/down stairs in stairwell without loss of balance with understanding acknowledged and tolerated sitting up in chair with RLE dangling after therapy.  Patient will benefit from continued skilled physical therapy in hospital and recommended venue below to increase strength, balance, endurance for safe ADLs and gait.    Recommendations for follow up therapy are one component of a multi-disciplinary discharge planning process, led by the attending physician.  Recommendations may be updated based on patient status, additional functional criteria and insurance authorization.  Follow Up Recommendations  Home health PT     Assistance Recommended at Discharge Set up Supervision/Assistance  Patient can return home with the following A little help with bathing/dressing/bathroom;Help with stairs or ramp for entrance;Assistance with cooking/housework;A little help with walking and/or transfers   Equipment Recommendations  None recommended by PT    Recommendations for Other Services       Precautions / Restrictions Precautions Precautions: Fall Restrictions Weight Bearing Restrictions: Yes RLE Weight Bearing: Weight bearing as tolerated     Mobility  Bed Mobility Overal bed mobility: Modified Independent             General bed mobility comments: good return for moving RLE during supine to sitting    Transfers Overall transfer level: Needs assistance Equipment used: Rolling  walker (2 wheels) Transfers: Sit to/from Stand, Bed to chair/wheelchair/BSC Sit to Stand: Supervision, Modified independent (Device/Increase time)   Step pivot transfers: Supervision       General transfer comment: required verbal cues for proper hand placement during sit to stands with good carryover demonstrated    Ambulation/Gait Ambulation/Gait assistance: Supervision Gait Distance (Feet): 150 Feet Assistive device: Rolling walker (2 wheels) Gait Pattern/deviations: Decreased step length - right, Decreased step length - left, Decreased stride length, Decreased stance time - right, Antalgic, Scissoring Gait velocity: decreased     General Gait Details: increased endurance/distance for gait training with fair/good return for right heel to toe stepping without loss of balance, occasional scissoring of legs and able to self correct withhout loss of balance   Stairs Stairs: Yes Stairs assistance: Supervision Stair Management: Two rails, Step to pattern Number of Stairs: 4 General stair comments: demonstrates fair/good return for going up/down stairs using bilateral side rails without loss of balance and understanding acknowledged   Wheelchair Mobility    Modified Rankin (Stroke Patients Only)       Balance Overall balance assessment: Needs assistance Sitting-balance support: Feet supported, No upper extremity supported Sitting balance-Leahy Scale: Good Sitting balance - Comments: seated at EOB   Standing balance support: During functional activity, Bilateral upper extremity supported Standing balance-Leahy Scale: Fair Standing balance comment: fair/good using RW                            Cognition Arousal/Alertness: Awake/alert Behavior During Therapy: WFL for tasks assessed/performed Overall Cognitive Status: Within Functional Limits for tasks assessed  Exercises Total Joint Exercises Ankle  Circles/Pumps: Supine, 10 reps, Right, Strengthening, AROM Quad Sets: AROM, Strengthening, Right, 10 reps, Supine Short Arc Quad: Strengthening, Right, 10 reps, Supine, AROM Heel Slides: AROM, Strengthening, Right, 10 reps, Supine Goniometric ROM: right knee ROM: 0 - 100 degrees    General Comments        Pertinent Vitals/Pain Pain Assessment Pain Assessment: 0-10 Pain Score: 8  Pain Location: right knee Pain Descriptors / Indicators: Sore, Grimacing, Discomfort Pain Intervention(s): Limited activity within patient's tolerance, Monitored during session, Premedicated before session, Patient requesting pain meds-RN notified    Home Living                          Prior Function            PT Goals (current goals can now be found in the care plan section) Acute Rehab PT Goals Patient Stated Goal: return home with family to assist PT Goal Formulation: With patient/family Time For Goal Achievement: 10/16/22 Potential to Achieve Goals: Good Progress towards PT goals: Progressing toward goals    Frequency    BID      PT Plan Current plan remains appropriate    Co-evaluation              AM-PAC PT "6 Clicks" Mobility   Outcome Measure  Help needed turning from your back to your side while in a flat bed without using bedrails?: None Help needed moving from lying on your back to sitting on the side of a flat bed without using bedrails?: None Help needed moving to and from a bed to a chair (including a wheelchair)?: A Little Help needed standing up from a chair using your arms (e.g., wheelchair or bedside chair)?: A Little Help needed to walk in hospital room?: A Little Help needed climbing 3-5 steps with a railing? : A Little 6 Click Score: 20    End of Session   Activity Tolerance: Patient tolerated treatment well;Patient limited by fatigue Patient left: in chair;with call bell/phone within reach Nurse Communication: Mobility status PT Visit  Diagnosis: Unsteadiness on feet (R26.81);Other abnormalities of gait and mobility (R26.89);Muscle weakness (generalized) (M62.81);Pain Pain - Right/Left: Right Pain - part of body: Knee     Time: 0822-0852 PT Time Calculation (min) (ACUTE ONLY): 30 min  Charges:  $Gait Training: 8-22 mins $Therapeutic Exercise: 8-22 mins                     9:33 AM, 10/15/22 Lonell Grandchild, MPT Physical Therapist with [facility] 336 [phone] office [phone] mobile phone

## 2022-10-15 NOTE — Care Management Obs Status (Signed)
[facility] NOTIFICATION   Patient Details  Name: Tiffany Bradley MRN: [id number] Date of Birth: [birth year]   Medicare Observation Status Notification Given:  Yes    Tommy Medal 10/15/2022, 2:02 PM

## 2022-10-15 NOTE — Discharge Summary (Signed)
Physician Discharge Summary  Patient ID: Tiffany Bradley MRN: [id number] DOB/AGE: [birth year] 76 y.o.  Admit date: 10/14/2022 Discharge date: 10/15/2022  Admission Diagnoses: Osteoarthritis right knee  Discharge Diagnoses:  Principal Problem:   Primary osteoarthritis of right knee Active Problems:   Unilateral primary osteoarthritis, right knee   Discharged Condition: good  Hospital Course: The patient has surgery on the day of admission October 14, 2022 she had an uncomplicated right total knee with a DePuy attune fixed-bearing posterior stabilized total knee we used a size 4 femur and tibia with a size 10 polyethylene insert   On day 2 she had physical therapy for the second day and did well with walking and bending her knee up to 100 degrees and was discharged in stable condition with no fever neurovascularly intact no signs of DVT Discharge Exam: Blood pressure 91/61, pulse 71, temperature 97.9 F (36.6 C), temperature source Oral, resp. rate 16, height 5' 6"$  (1.676 m), weight 78.2 kg, SpO2 97 %.      Latest Ref Rng & Units 10/15/2022    4:27 AM 10/10/2022   11:45 AM 03/01/2022    3:21 PM  CBC  WBC 4.0 - 10.5 K/uL 7.5  3.8    Hemoglobin 12.0 - 15.0 g/dL 9.0  11.0  7.8   Hematocrit 36.0 - 46.0 % 28.0  34.4  24.8   Platelets 150 - 400 K/uL 226  273         Latest Ref Rng & Units 10/15/2022    4:27 AM 10/10/2022   11:45 AM 02/18/2022    9:48 AM  BMP  Glucose 70 - 99 mg/dL 109  100  100   BUN 8 - 23 mg/dL 7  17  22   $ Creatinine 0.44 - 1.00 mg/dL 0.59  0.71  0.62   Sodium 135 - 145 mmol/L 135  142  142   Potassium 3.5 - 5.1 mmol/L 3.8  3.9  4.7   Chloride 98 - 111 mmol/L 103  107  108   CO2 22 - 32 mmol/L 26  26  27   $ Calcium 8.9 - 10.3 mg/dL 8.0  9.0  9.4       Disposition: Discharge disposition: 01-Home or Self Care       Discharge Instructions     Call MD / Call 911   Complete by: As directed    If you experience chest pain or shortness of breath, CALL 911 and be  transported to the hospital emergency room.  If you develope a fever above 101 F, pus (white drainage) or increased drainage or redness at the wound, or calf pain, call your surgeon's office.   Constipation Prevention   Complete by: As directed    Drink plenty of fluids.  Prune juice may be helpful.  You may use a stool softener, such as Colace (over the counter) 100 mg twice a day.  Use MiraLax (over the counter) for constipation as needed.   Diet - low sodium heart healthy   Complete by: As directed    Discharge instructions   Complete by: As directed    You have a blue pill replaced under your ankle use it 3 times a day for 30 minutes  If you have a continuous passive motion machine/CPM use it for 6 hours a day started at 75 degrees increase 10 degrees/day until you reach the maximum on the machine which is usually 115 degrees or 120 degrees  You do not have to change the  dressing  Do not take a shower until you see the doctor  Wear the stockings for 4 weeks   Increase activity slowly as tolerated   Complete by: As directed    Post-operative opioid taper instructions:   Complete by: As directed    POST-OPERATIVE OPIOID TAPER INSTRUCTIONS: It is important to wean off of your opioid medication as soon as possible. If you do not need pain medication after your surgery it is ok to stop day one. Opioids include: Codeine, Hydrocodone(Norco, Vicodin), Oxycodone(Percocet, oxycontin) and hydromorphone amongst others.  Long term and even short term use of opiods can cause: Increased pain response Dependence Constipation Depression Respiratory depression And more.  Withdrawal symptoms can include Flu like symptoms Nausea, vomiting And more Techniques to manage these symptoms Hydrate well Eat regular healthy meals Stay active Use relaxation techniques(deep breathing, meditating, yoga) Do Not substitute Alcohol to help with tapering If you have been on opioids for less than two weeks  and do not have pain than it is ok to stop all together.  Plan to wean off of opioids This plan should start within one week post op of your joint replacement. Maintain the same interval or time between taking each dose and first decrease the dose.  Cut the total daily intake of opioids by one tablet each day Next start to increase the time between doses. The last dose that should be eliminated is the evening dose.         Allergies as of 10/15/2022   No Known Allergies      Medication List     STOP taking these medications    diclofenac 75 MG EC tablet Commonly known as: VOLTAREN       TAKE these medications    amLODipine 10 MG tablet Commonly known as: NORVASC Take 1 tablet (10 mg total) by mouth daily.   aspirin 325 MG tablet Take 325 mg by mouth daily.   atorvastatin 20 MG tablet Commonly known as: LIPITOR Take 1 tablet (20 mg total) by mouth daily.   Biotin 10 MG Caps Take 10 mg of amoxicillin by mouth daily.   CALCIUM 600+D3 PO Take 1 tablet by mouth daily.   docusate sodium 100 MG capsule Commonly known as: Colace Take 1 capsule (100 mg total) by mouth 2 (two) times daily.   ferrous sulfate 325 (65 FE) MG tablet Take 325 mg by mouth daily.   gabapentin 300 MG capsule Commonly known as: NEURONTIN Take 1 capsule (300 mg total) by mouth 2 (two) times daily.   GLUCOSAMINE-MSM PO Take 2 tablets by mouth daily.   HYDROcodone-acetaminophen 10-325 MG tablet Commonly known as: Norco Take 1 tablet by mouth every 8 (eight) hours as needed.   mometasone 50 MCG/ACT nasal spray Commonly known as: NASONEX Use 2 spray(s) in each nostril once daily What changed:  how much to take how to take this when to take this additional instructions   omeprazole 40 MG capsule Commonly known as: PRILOSEC Take 1 capsule (40 mg total) by mouth daily.   polyethylene glycol 17 g packet Commonly known as: MiraLax Take 17 g by mouth daily.   tiZANidine 4 MG  tablet Commonly known as: ZANAFLEX Take 1 tablet (4 mg total) by mouth at bedtime as needed for muscle spasms. What changed: when to take this   tolterodine 2 MG 24 hr capsule Commonly known as: DETROL LA Take 1 capsule (2 mg total) by mouth daily.   torsemide 20 MG tablet Commonly  known as: DEMADEX Take 20 mg by mouth daily.   traMADol 50 MG tablet Commonly known as: ULTRAM Take 1 tablet (50 mg total) by mouth every 6 (six) hours.   Vitamin D 50 MCG (2000 UT) Caps Take 2,000 Units by mouth daily.   WOMENS MULTIVITAMIN PO Take 1 tablet by mouth daily. Chewable   multivitamin with minerals tablet Take 1 tablet by mouth daily. With Zinc        Follow-up Information     Carole Civil, MD Follow up on 10/29/2022.   Specialties: Orthopedic Surgery, Radiology Contact information: [address] [place] [address] [phone]         [facility] .                  Signed: Arther Abbott 10/15/2022, 12:15 PM

## 2022-10-15 NOTE — Telephone Encounter (Signed)
She was asking about hydrocodone and tramadol Hydrocodone if pain severe Tramadol if not severe

## 2022-10-15 NOTE — Progress Notes (Signed)
Patient ID: Tiffany Bradley, female   DOB: 1946/02/23, 77 y.o.   MRN: [id number] BP 91/61   Pulse 71   Temp 97.9 F (36.6 C) (Oral)   Resp 16   Ht 5' 6"$  (1.676 m)   Wt 78.2 kg   SpO2 97%   BMI 27.83 kg/m   Postop day 1 status post right total knee arthroplasty  Patient is doing well  X-rays show good postop x-ray alignment  Neurovascular exam is intact     Latest Ref Rng & Units 10/15/2022    4:27 AM 10/10/2022   11:45 AM 03/01/2022    3:21 PM  CBC  WBC 4.0 - 10.5 K/uL 7.5  3.8    Hemoglobin 12.0 - 15.0 g/dL 9.0  11.0  7.8   Hematocrit 36.0 - 46.0 % 28.0  34.4  24.8   Platelets 150 - 400 K/uL 226  273         Latest Ref Rng & Units 10/15/2022    4:27 AM 10/10/2022   11:45 AM 02/18/2022    9:48 AM  BMP  Glucose 70 - 99 mg/dL 109  100  100   BUN 8 - 23 mg/dL 7  17  22   $ Creatinine 0.44 - 1.00 mg/dL 0.59  0.71  0.62   Sodium 135 - 145 mmol/L 135  142  142   Potassium 3.5 - 5.1 mmol/L 3.8  3.9  4.7   Chloride 98 - 111 mmol/L 103  107  108   CO2 22 - 32 mmol/L 26  26  27   $ Calcium 8.9 - 10.3 mg/dL 8.0  9.0  9.4    Plan is for discharge today as long as the patient clears physical therapy parameters

## 2022-10-15 NOTE — Telephone Encounter (Signed)
[facility] w/[facility] [phone] lvm, needs clarification on the meds that were sent in this morning.

## 2022-10-15 NOTE — TOC Transition Note (Signed)
Transition of Care [facility]) - CM/SW Discharge Note   Patient Details  Name: Tiffany Bradley MRN: [id number] Date of Birth: 1945-11-27  Transition of Care [facility]) CM/SW Contact:  Iona Beard, [place] Phone Number: 10/15/2022, 11:28 AM  Clinical Narrative:    [facility] consulted for HH/DME needs. CSW confirmed per PT note that there are no DME recommendations for pt. CSW spoke to [place] with [place] who states that [facility] has been prearranged by Dr. Ruthe Mannan office. Marjory Lies has the needed orders and they will see pt when pt is discharged.   Final next level of care: Home w Home Health Services Barriers to Discharge: Continued Medical Work up   Patient Goals and CMS Choice CMS Medicare.gov Compare Post Acute Care list provided to:: Patient Choice offered to / list presented to : Patient  Discharge Placement                         Discharge Plan and Services Additional resources added to the After Visit Summary for                            [facility] Arranged: PT [facility] Agency: [place] Date [place]: 10/15/22   Representative spoke with at [place]: [place] Determinants of Health ([place]) Interventions SDOH Screenings   Food Insecurity: No Food Insecurity (10/14/2022)  Housing: Low Risk  (10/14/2022)  Transportation Needs: No Transportation Needs (10/14/2022)  Utilities: Not At Risk (10/14/2022)  Alcohol Screen: Low Risk  (05/16/2021)  Depression (PHQ2-9): Low Risk  (06/27/2022)  Financial Resource Strain: Low Risk  (05/16/2021)  Physical Activity: Sufficiently Active (05/16/2021)  Social Connections: Moderately Integrated (05/16/2021)  Stress: No Stress Concern Present (05/16/2021)  Tobacco Use: Medium Risk (10/14/2022)     Readmission Risk Interventions     No data to display

## 2022-10-15 NOTE — Progress Notes (Addendum)
Report was given to [place].

## 2022-10-16 DIAGNOSIS — R7303 Prediabetes: Secondary | ICD-10-CM | POA: Diagnosis not present

## 2022-10-16 DIAGNOSIS — E7800 Pure hypercholesterolemia, unspecified: Secondary | ICD-10-CM | POA: Diagnosis not present

## 2022-10-16 DIAGNOSIS — N3281 Overactive bladder: Secondary | ICD-10-CM | POA: Diagnosis not present

## 2022-10-16 DIAGNOSIS — K279 Peptic ulcer, site unspecified, unspecified as acute or chronic, without hemorrhage or perforation: Secondary | ICD-10-CM | POA: Diagnosis not present

## 2022-10-16 DIAGNOSIS — M810 Age-related osteoporosis without current pathological fracture: Secondary | ICD-10-CM | POA: Diagnosis not present

## 2022-10-16 DIAGNOSIS — I10 Essential (primary) hypertension: Secondary | ICD-10-CM | POA: Diagnosis not present

## 2022-10-16 DIAGNOSIS — Z471 Aftercare following joint replacement surgery: Secondary | ICD-10-CM | POA: Diagnosis not present

## 2022-10-16 DIAGNOSIS — Z96652 Presence of left artificial knee joint: Secondary | ICD-10-CM | POA: Diagnosis not present

## 2022-10-16 DIAGNOSIS — Z96651 Presence of right artificial knee joint: Secondary | ICD-10-CM | POA: Diagnosis not present

## 2022-10-16 DIAGNOSIS — Z96642 Presence of left artificial hip joint: Secondary | ICD-10-CM | POA: Diagnosis not present

## 2022-10-16 DIAGNOSIS — K219 Gastro-esophageal reflux disease without esophagitis: Secondary | ICD-10-CM | POA: Diagnosis not present

## 2022-10-16 DIAGNOSIS — R69 Illness, unspecified: Secondary | ICD-10-CM | POA: Diagnosis not present

## 2022-10-16 DIAGNOSIS — K56609 Unspecified intestinal obstruction, unspecified as to partial versus complete obstruction: Secondary | ICD-10-CM | POA: Diagnosis not present

## 2022-10-16 DIAGNOSIS — M1711 Unilateral primary osteoarthritis, right knee: Secondary | ICD-10-CM | POA: Diagnosis not present

## 2022-10-16 DIAGNOSIS — Z7982 Long term (current) use of aspirin: Secondary | ICD-10-CM | POA: Diagnosis not present

## 2022-10-16 DIAGNOSIS — D509 Iron deficiency anemia, unspecified: Secondary | ICD-10-CM | POA: Diagnosis not present

## 2022-10-16 DIAGNOSIS — M5136 Other intervertebral disc degeneration, lumbar region: Secondary | ICD-10-CM | POA: Diagnosis not present

## 2022-10-16 DIAGNOSIS — Z87891 Personal history of nicotine dependence: Secondary | ICD-10-CM | POA: Diagnosis not present

## 2022-10-16 DIAGNOSIS — K2970 Gastritis, unspecified, without bleeding: Secondary | ICD-10-CM | POA: Diagnosis not present

## 2022-10-17 NOTE — Anesthesia Postprocedure Evaluation (Signed)
Anesthesia Post Note  [name]  Procedure(s) Performed: TOTAL KNEE ARTHROPLASTY (Right: Knee)  Patient location during evaluation: Phase II Anesthesia Type: General Level of consciousness: awake Pain management: pain level controlled Vital Signs Assessment: post-procedure vital signs reviewed and stable Respiratory status: spontaneous breathing and respiratory function stable Cardiovascular status: blood pressure returned to baseline and stable Postop Assessment: no headache and no apparent nausea or vomiting Anesthetic complications: no Comments: Late entry   No notable events documented.   Last Vitals:  Vitals:   10/14/22 2215 10/15/22 0413  BP: (!) 148/98 91/61  Pulse: 77 71  Resp: 14 16  Temp: (!) 36.3 C 36.6 C  SpO2: 96% 97%    Last Pain:  Vitals:   10/15/22 0900  TempSrc:   PainSc: 0-No pain                 Louann Sjogren

## 2022-10-18 ENCOUNTER — Encounter (HOSPITAL_COMMUNITY): Payer: Self-pay | Admitting: Emergency Medicine

## 2022-10-18 ENCOUNTER — Emergency Department (HOSPITAL_COMMUNITY): Payer: Medicare HMO

## 2022-10-18 ENCOUNTER — Other Ambulatory Visit: Payer: Self-pay

## 2022-10-18 ENCOUNTER — Emergency Department (HOSPITAL_COMMUNITY)
Admission: EM | Admit: 2022-10-18 | Discharge: 2022-10-18 | Disposition: A | Discharge status: Home / Self Care | Payer: Medicare HMO | Attending: Emergency Medicine | Admitting: Emergency Medicine

## 2022-10-18 DIAGNOSIS — R7303 Prediabetes: Secondary | ICD-10-CM | POA: Diagnosis not present

## 2022-10-18 DIAGNOSIS — Z471 Aftercare following joint replacement surgery: Secondary | ICD-10-CM | POA: Diagnosis not present

## 2022-10-18 DIAGNOSIS — Z96652 Presence of left artificial knee joint: Secondary | ICD-10-CM | POA: Diagnosis not present

## 2022-10-18 DIAGNOSIS — M25461 Effusion, right knee: Secondary | ICD-10-CM | POA: Diagnosis not present

## 2022-10-18 DIAGNOSIS — Z7982 Long term (current) use of aspirin: Secondary | ICD-10-CM | POA: Insufficient documentation

## 2022-10-18 DIAGNOSIS — N3281 Overactive bladder: Secondary | ICD-10-CM | POA: Diagnosis not present

## 2022-10-18 DIAGNOSIS — M5136 Other intervertebral disc degeneration, lumbar region: Secondary | ICD-10-CM | POA: Diagnosis not present

## 2022-10-18 DIAGNOSIS — M25561 Pain in right knee: Secondary | ICD-10-CM | POA: Diagnosis not present

## 2022-10-18 DIAGNOSIS — Z96651 Presence of right artificial knee joint: Secondary | ICD-10-CM | POA: Insufficient documentation

## 2022-10-18 DIAGNOSIS — E7800 Pure hypercholesterolemia, unspecified: Secondary | ICD-10-CM | POA: Diagnosis not present

## 2022-10-18 DIAGNOSIS — R69 Illness, unspecified: Secondary | ICD-10-CM | POA: Diagnosis not present

## 2022-10-18 DIAGNOSIS — Z96642 Presence of left artificial hip joint: Secondary | ICD-10-CM | POA: Diagnosis not present

## 2022-10-18 DIAGNOSIS — M810 Age-related osteoporosis without current pathological fracture: Secondary | ICD-10-CM | POA: Diagnosis not present

## 2022-10-18 DIAGNOSIS — D509 Iron deficiency anemia, unspecified: Secondary | ICD-10-CM | POA: Diagnosis not present

## 2022-10-18 DIAGNOSIS — K219 Gastro-esophageal reflux disease without esophagitis: Secondary | ICD-10-CM | POA: Diagnosis not present

## 2022-10-18 DIAGNOSIS — K56609 Unspecified intestinal obstruction, unspecified as to partial versus complete obstruction: Secondary | ICD-10-CM | POA: Diagnosis not present

## 2022-10-18 DIAGNOSIS — K279 Peptic ulcer, site unspecified, unspecified as acute or chronic, without hemorrhage or perforation: Secondary | ICD-10-CM | POA: Diagnosis not present

## 2022-10-18 DIAGNOSIS — K2970 Gastritis, unspecified, without bleeding: Secondary | ICD-10-CM | POA: Diagnosis not present

## 2022-10-18 DIAGNOSIS — Z87891 Personal history of nicotine dependence: Secondary | ICD-10-CM | POA: Diagnosis not present

## 2022-10-18 DIAGNOSIS — I10 Essential (primary) hypertension: Secondary | ICD-10-CM | POA: Diagnosis not present

## 2022-10-18 NOTE — ED Provider Notes (Signed)
[facility] Provider Note   CSN: [id number] Arrival date & time: 10/18/22  1013     History  Chief Complaint  Patient presents with   Knee Pain    Tiffany Bradley is a 77 y.o. female.  She was discharged home 4 days ago after right total knee arthroplasty, she has a continuous motion device.  She is try to get a bed last night and got tangled up and twisted the knee.  She has no pain today but her home health nurse wanted her to come to the ED for evaluation.  She has no leg pain or swelling, no numbness or tingling.   Knee Pain      Home Medications Prior to Admission medications   Medication Sig Start Date End Date Taking? Authorizing Provider  amLODipine (NORVASC) 10 MG tablet Take 1 tablet (10 mg total) by mouth daily. 06/27/22  Yes Ronnie Doss M, DO  aspirin 325 MG tablet Take 325 mg by mouth daily.   Yes [provider]  atorvastatin (LIPITOR) 20 MG tablet Take 1 tablet (20 mg total) by mouth daily. 06/27/22  Yes Ronnie Doss M, DO  Biotin 10 MG CAPS Take 10 mg of amoxicillin by mouth daily.   Yes [provider]  Calcium Carb-Cholecalciferol (CALCIUM 600+D3 PO) Take 1 tablet by mouth daily.   Yes [provider]  Cholecalciferol (VITAMIN D) 50 MCG (2000 UT) CAPS Take 2,000 Units by mouth daily.   Yes [provider]  docusate sodium (COLACE) 100 MG capsule Take 1 capsule (100 mg total) by mouth 2 (two) times daily. 12/16/21 12/16/22 Yes Barton Dubois, MD  ferrous sulfate 325 (65 FE) MG tablet Take 325 mg by mouth daily.   Yes [provider]  gabapentin (NEURONTIN) 300 MG capsule Take 1 capsule (300 mg total) by mouth 2 (two) times daily. 06/27/22  Yes Gottschalk, Leatrice Jewels M, DO  Glucosamine HCl-MSM (GLUCOSAMINE-MSM PO) Take 2 tablets by mouth daily.   Yes [provider]  HYDROcodone-acetaminophen (NORCO) 10-325 MG tablet Take 1 tablet by mouth every 8 (eight) hours as  needed. Patient taking differently: Take 1 tablet by mouth every 8 (eight) hours as needed for moderate pain. 10/15/22  Yes Carole Civil, MD  mometasone (NASONEX) 50 MCG/ACT nasal spray Use 2 spray(s) in each nostril once daily Patient taking differently: Place 2 sprays into the nose daily. 06/27/22  Yes Ronnie Doss M, DO  Multiple Vitamins-Minerals (MULTIVITAMIN WITH MINERALS) tablet Take 1 tablet by mouth daily. With Zinc   Yes [provider]  omeprazole (PRILOSEC) 40 MG capsule Take 1 capsule (40 mg total) by mouth daily. 06/27/22  Yes Gottschalk, Leatrice Jewels M, DO  polyethylene glycol (MIRALAX) 17 g packet Take 17 g by mouth daily. 12/16/21  Yes Barton Dubois, MD  tiZANidine (ZANAFLEX) 4 MG tablet Take 1 tablet (4 mg total) by mouth at bedtime as needed for muscle spasms. Patient taking differently: Take 4 mg by mouth at bedtime. 06/27/22  Yes Ronnie Doss M, DO  tolterodine (DETROL LA) 2 MG 24 hr capsule Take 1 capsule (2 mg total) by mouth daily. 06/27/22  Yes Gottschalk, Leatrice Jewels M, DO  torsemide (DEMADEX) 20 MG tablet Take 20 mg by mouth daily.   Yes [provider]  traMADol (ULTRAM) 50 MG tablet Take 1 tablet (50 mg total) by mouth every 6 (six) hours. 10/15/22  Yes Carole Civil, MD      Allergies  Patient has no known allergies.    Review of Systems   Review of Systems  Physical Exam Updated Vital Signs BP (!) 130/90 (BP Location: Right Arm)   Pulse 72   Temp 98.9 F (37.2 C) (Oral)   Resp 18   Ht 5' 6"$  (1.676 m)   Wt 72.6 kg   SpO2 99%   BMI 25.82 kg/m  Physical Exam Vitals and nursing note reviewed.  Constitutional:      General: She is not in acute distress.    Appearance: She is well-developed.  HENT:     Head: Normocephalic and atraumatic.     Mouth/Throat:     Mouth: Mucous membranes are moist.  Eyes:     Conjunctiva/sclera: Conjunctivae normal.  Cardiovascular:     Rate and Rhythm: Normal rate and regular rhythm.      Heart sounds: No murmur heard. Pulmonary:     Effort: Pulmonary effort is normal. No respiratory distress.     Breath sounds: Normal breath sounds.  Musculoskeletal:        General: No swelling.     Cervical back: Neck supple.     Comments: His right knee has a very mild swelling with some mild ecchymosis.  Patient states range of motion is what has been since the surgery.  She is able to bend to 45 degrees without difficulty, DP and PT pulses in the right foot are 2+.  Sensation intact.  Skin:    General: Skin is warm and dry.     Capillary Refill: Capillary refill takes less than 2 seconds.     Comments: Midline incision on her right knee with bandage intact  Neurological:     Mental Status: She is alert.  Psychiatric:        Mood and Affect: Mood normal.     ED Results / Procedures / Treatments   Labs (all labs ordered are listed, but only abnormal results are displayed) Labs Reviewed - No data to display  EKG None  Radiology DG Knee Complete 4 Views Right  Result Date: 10/18/2022 CLINICAL DATA:  77 year old female with history of twisting injury to the knee complaining of knee pain. Recent history of knee surgery. EXAM: RIGHT KNEE - COMPLETE 4+ VIEW COMPARISON:  Right knee radiograph 10/14/2022. FINDINGS: Postoperative changes of recent right knee arthroplasty are noted. The femoral and tibial components of the prosthesis appear well seated without definite periprosthetic fracture or other acute abnormalities. Gas in the joint space seen on the prior study has resolved. Small suprapatellar joint effusion noted. Soft tissue swelling surrounding the knee joint, with anterior skin staples. IMPRESSION: 1. No acute radiographic abnormality of the right knee, with expected postoperative findings, as above. Electronically Signed   By: Vinnie Langton M.D.   On: 10/18/2022 12:47    Procedures Procedures    Medications Ordered in ED Medications - No data to display  ED Course/  Medical Decision Making/ A&P                             Medical Decision Making This patient presents to the ED for concern of right knee injury, this involves an extensive number of treatment options, and is a complaint that carries with it a high risk of complications and morbidity.  The differential diagnosis includes fracture, dislocation, sprain, contusion, other   Co morbidities that complicate the patient evaluation  Recent R TKA   Additional history obtained:  Additional history obtained from EMR External records from outside source obtained and reviewed including orthopedic postop note    Imaging Studies ordered:  I ordered imaging studies including x-ray right knee I independently visualized and interpreted imaging which showed hardware intact, no fractures or malalignment alignment I agree with the radiologist interpretation   Problem List / ED Course / Critical interventions / Medication management  Patient twisted knee trying to get out of bed and she is postop from recent TKA on the right.  She has no symptoms but home nurse wanted evaluation in the ED, x-ray is negative for any acute changes.  Discussed outpatient follow-up and return precautions    Test / Admission - Considered:  Considered to venous ultrasound for DVT as patient's home nurse had recommended that this may be needed.  Patient has no leg pain, no change in her's postop swelling, no calf tenderness.  No pulse deficits.  This with him simply having a twisting injury to the leg with no symptoms would not necessitate need for DVT study but they are given strict follow-up and return precautions    Amount and/or Complexity of Data Reviewed Radiology: ordered.           Final Clinical Impression(s) / ED Diagnoses Final diagnoses:  Right knee pain, unspecified chronicity    Rx / DC Orders ED Discharge Orders     None         Darci Current 10/18/22 1412    Carmin Muskrat, MD 10/19/22 [phone]

## 2022-10-18 NOTE — ED Triage Notes (Signed)
Patient had right knee replacement on 2/13, was trying to get out of bed to use bathroom last night and had compression/ elevation "machine in place and twisted leg trying to get out of bed." Denies falling, hitting head, or LOC. Patient c/o right knee and right hip pain.

## 2022-10-18 NOTE — Discharge Instructions (Signed)
Your x-ray was normal, I see no signs of a blood clot.  Follow-up with your orthopedic doctor and come back to the ER if you have swelling, redness, pain or other new or worsening symptoms.

## 2022-10-20 ENCOUNTER — Encounter (HOSPITAL_COMMUNITY): Payer: Self-pay | Admitting: Orthopedic Surgery

## 2022-10-20 DIAGNOSIS — Z96642 Presence of left artificial hip joint: Secondary | ICD-10-CM | POA: Diagnosis not present

## 2022-10-20 DIAGNOSIS — K219 Gastro-esophageal reflux disease without esophagitis: Secondary | ICD-10-CM | POA: Diagnosis not present

## 2022-10-20 DIAGNOSIS — K2970 Gastritis, unspecified, without bleeding: Secondary | ICD-10-CM | POA: Diagnosis not present

## 2022-10-20 DIAGNOSIS — Z96652 Presence of left artificial knee joint: Secondary | ICD-10-CM | POA: Diagnosis not present

## 2022-10-20 DIAGNOSIS — M5136 Other intervertebral disc degeneration, lumbar region: Secondary | ICD-10-CM | POA: Diagnosis not present

## 2022-10-20 DIAGNOSIS — M810 Age-related osteoporosis without current pathological fracture: Secondary | ICD-10-CM | POA: Diagnosis not present

## 2022-10-20 DIAGNOSIS — I10 Essential (primary) hypertension: Secondary | ICD-10-CM | POA: Diagnosis not present

## 2022-10-20 DIAGNOSIS — E7800 Pure hypercholesterolemia, unspecified: Secondary | ICD-10-CM | POA: Diagnosis not present

## 2022-10-20 DIAGNOSIS — Z87891 Personal history of nicotine dependence: Secondary | ICD-10-CM | POA: Diagnosis not present

## 2022-10-20 DIAGNOSIS — Z96651 Presence of right artificial knee joint: Secondary | ICD-10-CM | POA: Diagnosis not present

## 2022-10-20 DIAGNOSIS — R69 Illness, unspecified: Secondary | ICD-10-CM | POA: Diagnosis not present

## 2022-10-20 DIAGNOSIS — R7303 Prediabetes: Secondary | ICD-10-CM | POA: Diagnosis not present

## 2022-10-20 DIAGNOSIS — K279 Peptic ulcer, site unspecified, unspecified as acute or chronic, without hemorrhage or perforation: Secondary | ICD-10-CM | POA: Diagnosis not present

## 2022-10-20 DIAGNOSIS — Z471 Aftercare following joint replacement surgery: Secondary | ICD-10-CM | POA: Diagnosis not present

## 2022-10-20 DIAGNOSIS — N3281 Overactive bladder: Secondary | ICD-10-CM | POA: Diagnosis not present

## 2022-10-20 DIAGNOSIS — Z7982 Long term (current) use of aspirin: Secondary | ICD-10-CM | POA: Diagnosis not present

## 2022-10-20 DIAGNOSIS — D509 Iron deficiency anemia, unspecified: Secondary | ICD-10-CM | POA: Diagnosis not present

## 2022-10-20 DIAGNOSIS — K56609 Unspecified intestinal obstruction, unspecified as to partial versus complete obstruction: Secondary | ICD-10-CM | POA: Diagnosis not present

## 2022-10-22 DIAGNOSIS — K56609 Unspecified intestinal obstruction, unspecified as to partial versus complete obstruction: Secondary | ICD-10-CM | POA: Diagnosis not present

## 2022-10-22 DIAGNOSIS — I10 Essential (primary) hypertension: Secondary | ICD-10-CM | POA: Diagnosis not present

## 2022-10-22 DIAGNOSIS — E7800 Pure hypercholesterolemia, unspecified: Secondary | ICD-10-CM | POA: Diagnosis not present

## 2022-10-22 DIAGNOSIS — D509 Iron deficiency anemia, unspecified: Secondary | ICD-10-CM | POA: Diagnosis not present

## 2022-10-22 DIAGNOSIS — Z7982 Long term (current) use of aspirin: Secondary | ICD-10-CM | POA: Diagnosis not present

## 2022-10-22 DIAGNOSIS — K279 Peptic ulcer, site unspecified, unspecified as acute or chronic, without hemorrhage or perforation: Secondary | ICD-10-CM | POA: Diagnosis not present

## 2022-10-22 DIAGNOSIS — N3281 Overactive bladder: Secondary | ICD-10-CM | POA: Diagnosis not present

## 2022-10-22 DIAGNOSIS — K2970 Gastritis, unspecified, without bleeding: Secondary | ICD-10-CM | POA: Diagnosis not present

## 2022-10-22 DIAGNOSIS — Z87891 Personal history of nicotine dependence: Secondary | ICD-10-CM | POA: Diagnosis not present

## 2022-10-22 DIAGNOSIS — Z471 Aftercare following joint replacement surgery: Secondary | ICD-10-CM | POA: Diagnosis not present

## 2022-10-22 DIAGNOSIS — Z96642 Presence of left artificial hip joint: Secondary | ICD-10-CM | POA: Diagnosis not present

## 2022-10-22 DIAGNOSIS — R7303 Prediabetes: Secondary | ICD-10-CM | POA: Diagnosis not present

## 2022-10-22 DIAGNOSIS — Z96651 Presence of right artificial knee joint: Secondary | ICD-10-CM | POA: Diagnosis not present

## 2022-10-22 DIAGNOSIS — M5136 Other intervertebral disc degeneration, lumbar region: Secondary | ICD-10-CM | POA: Diagnosis not present

## 2022-10-22 DIAGNOSIS — K219 Gastro-esophageal reflux disease without esophagitis: Secondary | ICD-10-CM | POA: Diagnosis not present

## 2022-10-22 DIAGNOSIS — R69 Illness, unspecified: Secondary | ICD-10-CM | POA: Diagnosis not present

## 2022-10-22 DIAGNOSIS — Z96652 Presence of left artificial knee joint: Secondary | ICD-10-CM | POA: Diagnosis not present

## 2022-10-22 DIAGNOSIS — M810 Age-related osteoporosis without current pathological fracture: Secondary | ICD-10-CM | POA: Diagnosis not present

## 2022-10-24 DIAGNOSIS — Z87891 Personal history of nicotine dependence: Secondary | ICD-10-CM | POA: Diagnosis not present

## 2022-10-24 DIAGNOSIS — M810 Age-related osteoporosis without current pathological fracture: Secondary | ICD-10-CM | POA: Diagnosis not present

## 2022-10-24 DIAGNOSIS — Z96642 Presence of left artificial hip joint: Secondary | ICD-10-CM | POA: Diagnosis not present

## 2022-10-24 DIAGNOSIS — K2970 Gastritis, unspecified, without bleeding: Secondary | ICD-10-CM | POA: Diagnosis not present

## 2022-10-24 DIAGNOSIS — K219 Gastro-esophageal reflux disease without esophagitis: Secondary | ICD-10-CM | POA: Diagnosis not present

## 2022-10-24 DIAGNOSIS — Z96651 Presence of right artificial knee joint: Secondary | ICD-10-CM | POA: Diagnosis not present

## 2022-10-24 DIAGNOSIS — K279 Peptic ulcer, site unspecified, unspecified as acute or chronic, without hemorrhage or perforation: Secondary | ICD-10-CM | POA: Diagnosis not present

## 2022-10-24 DIAGNOSIS — D509 Iron deficiency anemia, unspecified: Secondary | ICD-10-CM | POA: Diagnosis not present

## 2022-10-24 DIAGNOSIS — Z96652 Presence of left artificial knee joint: Secondary | ICD-10-CM | POA: Diagnosis not present

## 2022-10-24 DIAGNOSIS — N3281 Overactive bladder: Secondary | ICD-10-CM | POA: Diagnosis not present

## 2022-10-24 DIAGNOSIS — E7800 Pure hypercholesterolemia, unspecified: Secondary | ICD-10-CM | POA: Diagnosis not present

## 2022-10-24 DIAGNOSIS — K56609 Unspecified intestinal obstruction, unspecified as to partial versus complete obstruction: Secondary | ICD-10-CM | POA: Diagnosis not present

## 2022-10-24 DIAGNOSIS — Z7982 Long term (current) use of aspirin: Secondary | ICD-10-CM | POA: Diagnosis not present

## 2022-10-24 DIAGNOSIS — M5136 Other intervertebral disc degeneration, lumbar region: Secondary | ICD-10-CM | POA: Diagnosis not present

## 2022-10-24 DIAGNOSIS — I10 Essential (primary) hypertension: Secondary | ICD-10-CM | POA: Diagnosis not present

## 2022-10-24 DIAGNOSIS — Z471 Aftercare following joint replacement surgery: Secondary | ICD-10-CM | POA: Diagnosis not present

## 2022-10-24 DIAGNOSIS — R7303 Prediabetes: Secondary | ICD-10-CM | POA: Diagnosis not present

## 2022-10-24 DIAGNOSIS — R69 Illness, unspecified: Secondary | ICD-10-CM | POA: Diagnosis not present

## 2022-10-27 ENCOUNTER — Telehealth: Payer: Self-pay | Admitting: Radiology

## 2022-10-27 ENCOUNTER — Ambulatory Visit: Payer: Medicare HMO | Admitting: Orthopaedic Surgery

## 2022-10-27 DIAGNOSIS — N3281 Overactive bladder: Secondary | ICD-10-CM | POA: Diagnosis not present

## 2022-10-27 DIAGNOSIS — Z96642 Presence of left artificial hip joint: Secondary | ICD-10-CM | POA: Diagnosis not present

## 2022-10-27 DIAGNOSIS — R7303 Prediabetes: Secondary | ICD-10-CM | POA: Diagnosis not present

## 2022-10-27 DIAGNOSIS — Z96651 Presence of right artificial knee joint: Secondary | ICD-10-CM | POA: Diagnosis not present

## 2022-10-27 DIAGNOSIS — M5136 Other intervertebral disc degeneration, lumbar region: Secondary | ICD-10-CM | POA: Diagnosis not present

## 2022-10-27 DIAGNOSIS — R69 Illness, unspecified: Secondary | ICD-10-CM | POA: Diagnosis not present

## 2022-10-27 DIAGNOSIS — K2970 Gastritis, unspecified, without bleeding: Secondary | ICD-10-CM | POA: Diagnosis not present

## 2022-10-27 DIAGNOSIS — E7800 Pure hypercholesterolemia, unspecified: Secondary | ICD-10-CM | POA: Diagnosis not present

## 2022-10-27 DIAGNOSIS — Z7982 Long term (current) use of aspirin: Secondary | ICD-10-CM | POA: Diagnosis not present

## 2022-10-27 DIAGNOSIS — Z471 Aftercare following joint replacement surgery: Secondary | ICD-10-CM | POA: Diagnosis not present

## 2022-10-27 DIAGNOSIS — M810 Age-related osteoporosis without current pathological fracture: Secondary | ICD-10-CM | POA: Diagnosis not present

## 2022-10-27 DIAGNOSIS — D509 Iron deficiency anemia, unspecified: Secondary | ICD-10-CM | POA: Diagnosis not present

## 2022-10-27 DIAGNOSIS — K279 Peptic ulcer, site unspecified, unspecified as acute or chronic, without hemorrhage or perforation: Secondary | ICD-10-CM | POA: Diagnosis not present

## 2022-10-27 DIAGNOSIS — I10 Essential (primary) hypertension: Secondary | ICD-10-CM | POA: Diagnosis not present

## 2022-10-27 DIAGNOSIS — K219 Gastro-esophageal reflux disease without esophagitis: Secondary | ICD-10-CM | POA: Diagnosis not present

## 2022-10-27 DIAGNOSIS — Z87891 Personal history of nicotine dependence: Secondary | ICD-10-CM | POA: Diagnosis not present

## 2022-10-27 DIAGNOSIS — K56609 Unspecified intestinal obstruction, unspecified as to partial versus complete obstruction: Secondary | ICD-10-CM | POA: Diagnosis not present

## 2022-10-27 DIAGNOSIS — Z96652 Presence of left artificial knee joint: Secondary | ICD-10-CM | POA: Diagnosis not present

## 2022-10-27 NOTE — Telephone Encounter (Signed)
Email from [place] with [facility] said patient had tried to cal [place], could not reach [place].  Patient needs a PO appt.  I called this AM and scheduled.  Patient had one on sched for March, but no 2 week postop appt.

## 2022-10-29 ENCOUNTER — Ambulatory Visit (HOSPITAL_COMMUNITY): Payer: Medicare HMO | Admitting: Physical Therapy

## 2022-10-29 DIAGNOSIS — K2970 Gastritis, unspecified, without bleeding: Secondary | ICD-10-CM | POA: Diagnosis not present

## 2022-10-29 DIAGNOSIS — K219 Gastro-esophageal reflux disease without esophagitis: Secondary | ICD-10-CM | POA: Diagnosis not present

## 2022-10-29 DIAGNOSIS — N3281 Overactive bladder: Secondary | ICD-10-CM | POA: Diagnosis not present

## 2022-10-29 DIAGNOSIS — Z96651 Presence of right artificial knee joint: Secondary | ICD-10-CM | POA: Diagnosis not present

## 2022-10-29 DIAGNOSIS — Z87891 Personal history of nicotine dependence: Secondary | ICD-10-CM | POA: Diagnosis not present

## 2022-10-29 DIAGNOSIS — K56609 Unspecified intestinal obstruction, unspecified as to partial versus complete obstruction: Secondary | ICD-10-CM | POA: Diagnosis not present

## 2022-10-29 DIAGNOSIS — Z7982 Long term (current) use of aspirin: Secondary | ICD-10-CM | POA: Diagnosis not present

## 2022-10-29 DIAGNOSIS — M810 Age-related osteoporosis without current pathological fracture: Secondary | ICD-10-CM | POA: Diagnosis not present

## 2022-10-29 DIAGNOSIS — M5136 Other intervertebral disc degeneration, lumbar region: Secondary | ICD-10-CM | POA: Diagnosis not present

## 2022-10-29 DIAGNOSIS — Z471 Aftercare following joint replacement surgery: Secondary | ICD-10-CM | POA: Diagnosis not present

## 2022-10-29 DIAGNOSIS — D509 Iron deficiency anemia, unspecified: Secondary | ICD-10-CM | POA: Diagnosis not present

## 2022-10-29 DIAGNOSIS — E7800 Pure hypercholesterolemia, unspecified: Secondary | ICD-10-CM | POA: Diagnosis not present

## 2022-10-29 DIAGNOSIS — I10 Essential (primary) hypertension: Secondary | ICD-10-CM | POA: Diagnosis not present

## 2022-10-29 DIAGNOSIS — R7303 Prediabetes: Secondary | ICD-10-CM | POA: Diagnosis not present

## 2022-10-29 DIAGNOSIS — Z96652 Presence of left artificial knee joint: Secondary | ICD-10-CM | POA: Diagnosis not present

## 2022-10-29 DIAGNOSIS — R69 Illness, unspecified: Secondary | ICD-10-CM | POA: Diagnosis not present

## 2022-10-29 DIAGNOSIS — K279 Peptic ulcer, site unspecified, unspecified as acute or chronic, without hemorrhage or perforation: Secondary | ICD-10-CM | POA: Diagnosis not present

## 2022-10-29 DIAGNOSIS — Z96642 Presence of left artificial hip joint: Secondary | ICD-10-CM | POA: Diagnosis not present

## 2022-10-30 ENCOUNTER — Ambulatory Visit (INDEPENDENT_AMBULATORY_CARE_PROVIDER_SITE_OTHER): Payer: Medicare HMO | Admitting: Orthopedic Surgery

## 2022-10-30 ENCOUNTER — Encounter: Payer: Self-pay | Admitting: Orthopedic Surgery

## 2022-10-30 ENCOUNTER — Encounter: Payer: Self-pay | Admitting: Radiology

## 2022-10-30 DIAGNOSIS — Z96651 Presence of right artificial knee joint: Secondary | ICD-10-CM

## 2022-10-30 DIAGNOSIS — M170 Bilateral primary osteoarthritis of knee: Secondary | ICD-10-CM

## 2022-10-30 DIAGNOSIS — M5136 Other intervertebral disc degeneration, lumbar region: Secondary | ICD-10-CM

## 2022-10-30 DIAGNOSIS — M1711 Unilateral primary osteoarthritis, right knee: Secondary | ICD-10-CM

## 2022-10-30 MED ORDER — TIZANIDINE HCL 4 MG PO TABS: 4.0000 mg | ORAL_TABLET | Freq: Three times a day (TID) | ORAL | 2 refills | Status: DC | PRN

## 2022-10-30 MED ORDER — HYDROCODONE-ACETAMINOPHEN 7.5-325 MG PO TABS: 1.0000 | ORAL_TABLET | ORAL | 0 refills | Status: AC | PRN

## 2022-10-30 MED ORDER — ASPIRIN 325 MG PO TABS: 325.0000 mg | ORAL_TABLET | Freq: Every day | ORAL | 0 refills | Status: AC

## 2022-10-30 MED ORDER — TRAMADOL HCL 50 MG PO TABS: 50.0000 mg | ORAL_TABLET | Freq: Four times a day (QID) | ORAL | 0 refills | Status: DC

## 2022-10-30 NOTE — Progress Notes (Signed)
Chief Complaint  Patient presents with   Post-op Follow-up    Total knee replacement right  10/14/22   Medication Refill    Needs refill of Hydrocodone and Tramadol    POST OP VISIT   Encounter Diagnoses  Name Primary?   Primary osteoarthritis of right knee    S/P TKR (total knee replacement), right Yes   DDD (degenerative disc disease), lumbar    Primary osteoarthritis of both knees      POD  #   15   Syreta is doing well her range of motion was 0 to 90 degrees  Staples taken out easily no signs of infection  The only thing is she was not taking her aspirin she said it is on the paper but she was not taking it she has been wearing the TED hose I do not see any signs of DVT  Therapy notes were reviewed and the patient will continue that and follow-up with [place] in 3 weeks  Meds ordered this encounter  Medications   aspirin 325 MG tablet    Sig: Take 1 tablet (325 mg total) by mouth daily.    Dispense:  30 tablet    Refill:  0   tiZANidine (ZANAFLEX) 4 MG tablet    Sig: Take 1 tablet (4 mg total) by mouth every 8 (eight) hours as needed for muscle spasms.    Dispense:  60 tablet    Refill:  2   traMADol (ULTRAM) 50 MG tablet    Sig: Take 1 tablet (50 mg total) by mouth every 6 (six) hours.    Dispense:  30 tablet    Refill:  0   HYDROcodone-acetaminophen (NORCO) 7.5-325 MG tablet    Sig: Take 1 tablet by mouth every 4 (four) hours as needed for up to 5 days for moderate pain.    Dispense:  30 tablet    Refill:  0

## 2022-10-30 NOTE — Patient Instructions (Addendum)
Call today/ now to schedule the outpatient therapy for week of March 11th, the number to [facility] in [place] is [phone]

## 2022-10-31 DIAGNOSIS — N3281 Overactive bladder: Secondary | ICD-10-CM | POA: Diagnosis not present

## 2022-10-31 DIAGNOSIS — Z96652 Presence of left artificial knee joint: Secondary | ICD-10-CM | POA: Diagnosis not present

## 2022-10-31 DIAGNOSIS — Z96651 Presence of right artificial knee joint: Secondary | ICD-10-CM | POA: Diagnosis not present

## 2022-10-31 DIAGNOSIS — K56609 Unspecified intestinal obstruction, unspecified as to partial versus complete obstruction: Secondary | ICD-10-CM | POA: Diagnosis not present

## 2022-10-31 DIAGNOSIS — Z87891 Personal history of nicotine dependence: Secondary | ICD-10-CM | POA: Diagnosis not present

## 2022-10-31 DIAGNOSIS — R7303 Prediabetes: Secondary | ICD-10-CM | POA: Diagnosis not present

## 2022-10-31 DIAGNOSIS — Z471 Aftercare following joint replacement surgery: Secondary | ICD-10-CM | POA: Diagnosis not present

## 2022-10-31 DIAGNOSIS — K279 Peptic ulcer, site unspecified, unspecified as acute or chronic, without hemorrhage or perforation: Secondary | ICD-10-CM | POA: Diagnosis not present

## 2022-10-31 DIAGNOSIS — E7800 Pure hypercholesterolemia, unspecified: Secondary | ICD-10-CM | POA: Diagnosis not present

## 2022-10-31 DIAGNOSIS — Z7982 Long term (current) use of aspirin: Secondary | ICD-10-CM | POA: Diagnosis not present

## 2022-10-31 DIAGNOSIS — D509 Iron deficiency anemia, unspecified: Secondary | ICD-10-CM | POA: Diagnosis not present

## 2022-10-31 DIAGNOSIS — R69 Illness, unspecified: Secondary | ICD-10-CM | POA: Diagnosis not present

## 2022-10-31 DIAGNOSIS — I10 Essential (primary) hypertension: Secondary | ICD-10-CM | POA: Diagnosis not present

## 2022-10-31 DIAGNOSIS — M5136 Other intervertebral disc degeneration, lumbar region: Secondary | ICD-10-CM | POA: Diagnosis not present

## 2022-10-31 DIAGNOSIS — Z96642 Presence of left artificial hip joint: Secondary | ICD-10-CM | POA: Diagnosis not present

## 2022-10-31 DIAGNOSIS — K219 Gastro-esophageal reflux disease without esophagitis: Secondary | ICD-10-CM | POA: Diagnosis not present

## 2022-10-31 DIAGNOSIS — K2970 Gastritis, unspecified, without bleeding: Secondary | ICD-10-CM | POA: Diagnosis not present

## 2022-10-31 DIAGNOSIS — M810 Age-related osteoporosis without current pathological fracture: Secondary | ICD-10-CM | POA: Diagnosis not present

## 2022-11-03 DIAGNOSIS — R69 Illness, unspecified: Secondary | ICD-10-CM | POA: Diagnosis not present

## 2022-11-03 DIAGNOSIS — K219 Gastro-esophageal reflux disease without esophagitis: Secondary | ICD-10-CM | POA: Diagnosis not present

## 2022-11-03 DIAGNOSIS — M810 Age-related osteoporosis without current pathological fracture: Secondary | ICD-10-CM | POA: Diagnosis not present

## 2022-11-03 DIAGNOSIS — I10 Essential (primary) hypertension: Secondary | ICD-10-CM | POA: Diagnosis not present

## 2022-11-03 DIAGNOSIS — Z96652 Presence of left artificial knee joint: Secondary | ICD-10-CM | POA: Diagnosis not present

## 2022-11-03 DIAGNOSIS — E7800 Pure hypercholesterolemia, unspecified: Secondary | ICD-10-CM | POA: Diagnosis not present

## 2022-11-03 DIAGNOSIS — K2970 Gastritis, unspecified, without bleeding: Secondary | ICD-10-CM | POA: Diagnosis not present

## 2022-11-03 DIAGNOSIS — K56609 Unspecified intestinal obstruction, unspecified as to partial versus complete obstruction: Secondary | ICD-10-CM | POA: Diagnosis not present

## 2022-11-03 DIAGNOSIS — Z7982 Long term (current) use of aspirin: Secondary | ICD-10-CM | POA: Diagnosis not present

## 2022-11-03 DIAGNOSIS — Z471 Aftercare following joint replacement surgery: Secondary | ICD-10-CM | POA: Diagnosis not present

## 2022-11-03 DIAGNOSIS — Z96642 Presence of left artificial hip joint: Secondary | ICD-10-CM | POA: Diagnosis not present

## 2022-11-03 DIAGNOSIS — Z87891 Personal history of nicotine dependence: Secondary | ICD-10-CM | POA: Diagnosis not present

## 2022-11-03 DIAGNOSIS — Z96651 Presence of right artificial knee joint: Secondary | ICD-10-CM | POA: Diagnosis not present

## 2022-11-03 DIAGNOSIS — R7303 Prediabetes: Secondary | ICD-10-CM | POA: Diagnosis not present

## 2022-11-03 DIAGNOSIS — D509 Iron deficiency anemia, unspecified: Secondary | ICD-10-CM | POA: Diagnosis not present

## 2022-11-03 DIAGNOSIS — M5136 Other intervertebral disc degeneration, lumbar region: Secondary | ICD-10-CM | POA: Diagnosis not present

## 2022-11-03 DIAGNOSIS — K279 Peptic ulcer, site unspecified, unspecified as acute or chronic, without hemorrhage or perforation: Secondary | ICD-10-CM | POA: Diagnosis not present

## 2022-11-03 DIAGNOSIS — N3281 Overactive bladder: Secondary | ICD-10-CM | POA: Diagnosis not present

## 2022-11-05 DIAGNOSIS — K2970 Gastritis, unspecified, without bleeding: Secondary | ICD-10-CM | POA: Diagnosis not present

## 2022-11-05 DIAGNOSIS — E7800 Pure hypercholesterolemia, unspecified: Secondary | ICD-10-CM | POA: Diagnosis not present

## 2022-11-05 DIAGNOSIS — M5136 Other intervertebral disc degeneration, lumbar region: Secondary | ICD-10-CM | POA: Diagnosis not present

## 2022-11-05 DIAGNOSIS — Z96651 Presence of right artificial knee joint: Secondary | ICD-10-CM | POA: Diagnosis not present

## 2022-11-05 DIAGNOSIS — R69 Illness, unspecified: Secondary | ICD-10-CM | POA: Diagnosis not present

## 2022-11-05 DIAGNOSIS — K219 Gastro-esophageal reflux disease without esophagitis: Secondary | ICD-10-CM | POA: Diagnosis not present

## 2022-11-05 DIAGNOSIS — Z96652 Presence of left artificial knee joint: Secondary | ICD-10-CM | POA: Diagnosis not present

## 2022-11-05 DIAGNOSIS — Z87891 Personal history of nicotine dependence: Secondary | ICD-10-CM | POA: Diagnosis not present

## 2022-11-05 DIAGNOSIS — K279 Peptic ulcer, site unspecified, unspecified as acute or chronic, without hemorrhage or perforation: Secondary | ICD-10-CM | POA: Diagnosis not present

## 2022-11-05 DIAGNOSIS — Z7982 Long term (current) use of aspirin: Secondary | ICD-10-CM | POA: Diagnosis not present

## 2022-11-05 DIAGNOSIS — I10 Essential (primary) hypertension: Secondary | ICD-10-CM | POA: Diagnosis not present

## 2022-11-05 DIAGNOSIS — Z471 Aftercare following joint replacement surgery: Secondary | ICD-10-CM | POA: Diagnosis not present

## 2022-11-05 DIAGNOSIS — N3281 Overactive bladder: Secondary | ICD-10-CM | POA: Diagnosis not present

## 2022-11-05 DIAGNOSIS — K56609 Unspecified intestinal obstruction, unspecified as to partial versus complete obstruction: Secondary | ICD-10-CM | POA: Diagnosis not present

## 2022-11-05 DIAGNOSIS — M810 Age-related osteoporosis without current pathological fracture: Secondary | ICD-10-CM | POA: Diagnosis not present

## 2022-11-05 DIAGNOSIS — D509 Iron deficiency anemia, unspecified: Secondary | ICD-10-CM | POA: Diagnosis not present

## 2022-11-05 DIAGNOSIS — R7303 Prediabetes: Secondary | ICD-10-CM | POA: Diagnosis not present

## 2022-11-05 DIAGNOSIS — Z96642 Presence of left artificial hip joint: Secondary | ICD-10-CM | POA: Diagnosis not present

## 2022-11-06 ENCOUNTER — Encounter: Payer: Self-pay | Admitting: Radiology

## 2022-11-07 DIAGNOSIS — K219 Gastro-esophageal reflux disease without esophagitis: Secondary | ICD-10-CM | POA: Diagnosis not present

## 2022-11-07 DIAGNOSIS — K279 Peptic ulcer, site unspecified, unspecified as acute or chronic, without hemorrhage or perforation: Secondary | ICD-10-CM | POA: Diagnosis not present

## 2022-11-07 DIAGNOSIS — E7800 Pure hypercholesterolemia, unspecified: Secondary | ICD-10-CM | POA: Diagnosis not present

## 2022-11-07 DIAGNOSIS — Z96651 Presence of right artificial knee joint: Secondary | ICD-10-CM | POA: Diagnosis not present

## 2022-11-07 DIAGNOSIS — M5136 Other intervertebral disc degeneration, lumbar region: Secondary | ICD-10-CM | POA: Diagnosis not present

## 2022-11-07 DIAGNOSIS — Z96642 Presence of left artificial hip joint: Secondary | ICD-10-CM | POA: Diagnosis not present

## 2022-11-07 DIAGNOSIS — R7303 Prediabetes: Secondary | ICD-10-CM | POA: Diagnosis not present

## 2022-11-07 DIAGNOSIS — Z87891 Personal history of nicotine dependence: Secondary | ICD-10-CM | POA: Diagnosis not present

## 2022-11-07 DIAGNOSIS — R69 Illness, unspecified: Secondary | ICD-10-CM | POA: Diagnosis not present

## 2022-11-07 DIAGNOSIS — D509 Iron deficiency anemia, unspecified: Secondary | ICD-10-CM | POA: Diagnosis not present

## 2022-11-07 DIAGNOSIS — K56609 Unspecified intestinal obstruction, unspecified as to partial versus complete obstruction: Secondary | ICD-10-CM | POA: Diagnosis not present

## 2022-11-07 DIAGNOSIS — N3281 Overactive bladder: Secondary | ICD-10-CM | POA: Diagnosis not present

## 2022-11-07 DIAGNOSIS — M810 Age-related osteoporosis without current pathological fracture: Secondary | ICD-10-CM | POA: Diagnosis not present

## 2022-11-07 DIAGNOSIS — Z96652 Presence of left artificial knee joint: Secondary | ICD-10-CM | POA: Diagnosis not present

## 2022-11-07 DIAGNOSIS — Z7982 Long term (current) use of aspirin: Secondary | ICD-10-CM | POA: Diagnosis not present

## 2022-11-07 DIAGNOSIS — Z471 Aftercare following joint replacement surgery: Secondary | ICD-10-CM | POA: Diagnosis not present

## 2022-11-07 DIAGNOSIS — I10 Essential (primary) hypertension: Secondary | ICD-10-CM | POA: Diagnosis not present

## 2022-11-07 DIAGNOSIS — K2970 Gastritis, unspecified, without bleeding: Secondary | ICD-10-CM | POA: Diagnosis not present

## 2022-11-10 ENCOUNTER — Encounter: Payer: Medicare HMO | Admitting: Orthopedic Surgery

## 2022-11-10 ENCOUNTER — Ambulatory Visit: Payer: Medicare HMO | Attending: Orthopedic Surgery | Admitting: Physical Therapy

## 2022-11-10 ENCOUNTER — Encounter: Payer: Self-pay | Admitting: Physical Therapy

## 2022-11-10 DIAGNOSIS — M25561 Pain in right knee: Secondary | ICD-10-CM | POA: Diagnosis present

## 2022-11-10 DIAGNOSIS — M25661 Stiffness of right knee, not elsewhere classified: Secondary | ICD-10-CM | POA: Diagnosis present

## 2022-11-10 DIAGNOSIS — M1711 Unilateral primary osteoarthritis, right knee: Secondary | ICD-10-CM | POA: Diagnosis not present

## 2022-11-10 DIAGNOSIS — R600 Localized edema: Secondary | ICD-10-CM | POA: Diagnosis present

## 2022-11-10 DIAGNOSIS — Z96651 Presence of right artificial knee joint: Secondary | ICD-10-CM | POA: Insufficient documentation

## 2022-11-10 DIAGNOSIS — G8929 Other chronic pain: Secondary | ICD-10-CM | POA: Insufficient documentation

## 2022-11-10 DIAGNOSIS — M6281 Muscle weakness (generalized): Secondary | ICD-10-CM | POA: Diagnosis present

## 2022-11-10 NOTE — Therapy (Signed)
OUTPATIENT PHYSICAL THERAPY LOWER EXTREMITY EVALUATION   Patient Name: Tiffany Bradley MRN: [id number] DOB:1946/07/19, 77 y.o., female Today's Date: 11/10/2022  END OF SESSION:  PT End of Session - 11/10/22 1014     Visit Number 1    Number of Visits 12    Date for PT Re-Evaluation 02/08/23    Authorization Type FOTO AT LEAST EVERY 5TH VISIT.  PROGRESS NOTE AT 10TH VISIT.  KX MODIFIER AFTER 15 VISITS.    PT Start Time [phone]    PT Stop Time 1028    PT Time Calculation (min) 44 min    Activity Tolerance Patient tolerated treatment well    Behavior During Therapy WFL for tasks assessed/performed             Past Medical History:  Diagnosis Date   Arthritis    Back pain    Cataract    DJD (degenerative joint disease)    Full dentures    GERD (gastroesophageal reflux disease)    Glaucoma    History of bleeding ulcers    patient reports multiple hospitalizations remotely   History of total left hip replacement    Hypertension    Pre-diabetes    Wears glasses    Past Surgical History:  Procedure Laterality Date   BREAST REDUCTION SURGERY Bilateral 04/25/2013   Procedure: MAMMARY REDUCTION  (BREAST);  Surgeon: Cristine Polio, MD;  Location: [facility];  Service: Plastics;  Laterality: Bilateral;   COLONOSCOPY     2008, normal per patient   COLONOSCOPY N/A 01/16/2017   Procedure: COLONOSCOPY;  Surgeon: Danie Binder, MD;  [facility];  Service: Endoscopy;  Laterality: N/A;  830    COSMETIC SURGERY     ESOPHAGOGASTRODUODENOSCOPY N/A 01/16/2017   Procedure: ESOPHAGOGASTRODUODENOSCOPY (EGD);  Surgeon: Danie Binder, MD;  [facility]: [facility];  Service: Endoscopy;  Laterality: N/A;   PARTIAL COLECTOMY N/A 06/29/2016   Procedure: PARTIAL COLECTOMY;  Surgeon: Vickie Epley, MD;  Location: [facility];  Service: General;  Laterality: N/A;   REDUCTION MAMMAPLASTY     TOTAL HIP ARTHROPLASTY Left 02/28/2022   Procedure: LEFT TOTAL HIP ARTHROPLASTY  ANTERIOR APPROACH;  Surgeon: Mcarthur Rossetti, MD;  Location: [facility];  Service: Orthopedics;  Laterality: Left;   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   Procedure: TOTAL KNEE ARTHROPLASTY;  Surgeon: Carole Civil, MD;  Location: [facility];  Service: Orthopedics;  Laterality: Left;  DePuy    TOTAL KNEE ARTHROPLASTY Right 10/14/2022   Procedure: TOTAL KNEE ARTHROPLASTY;  Surgeon: Carole Civil, MD;  Location: [facility];  Service: Orthopedics;  Laterality: Right;   TUBAL LIGATION     Patient Active Problem List   Diagnosis Date Noted   Unilateral primary osteoarthritis, right knee 10/14/2022   Primary osteoarthritis of right knee 10/14/2022   Status post left hip replacement 02/28/2022   Small bowel obstruction ([place]) 12/13/2021   Hyperglycemia 12/13/2021   Unilateral primary osteoarthritis, left hip 12/12/2021   Viral upper respiratory tract infection 03/05/2021   History of retinal detachment 03/12/2020   Pure hypercholesterolemia 03/12/2020   PUD (peptic ulcer disease) 05/09/2019   OAB (overactive bladder) 05/09/2019   Lumbar back pain 05/09/2019   DDD (degenerative disc disease), lumbar 05/09/2019   Status post total knee replacement, 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 02/11/2018   Iron deficiency anemia 04/28/2017   Vitamin D deficiency 04/28/2017   Osteopenia of lumbar spine 04/28/2017   Gastritis, erosive    Abdominal pain, epigastric 12/12/2016  Colon cancer screening 11/03/2016   Essential hypertension 11/03/2016   Primary osteoarthritis of both knees 11/03/2016   GERD (gastroesophageal reflux disease) 11/03/2016   Age-related osteoporosis without current pathological fracture 11/03/2016   Perforation of sigmoid colon ([place]) 06/29/2016   REFERRING PROVIDER: Arther Abbott MD  REFERRING DIAG: S/p right total knee replacement.  THERAPY DIAG:  Chronic pain of right knee - Plan: PT plan of care cert/re-cert  Muscle weakness (generalized) - Plan: PT plan of care  cert/re-cert  Stiffness of right knee, not elsewhere classified - Plan: PT plan of care cert/re-cert  Localized edema - Plan: PT plan of care cert/re-cert  Rationale for Evaluation and Treatment: Rehabilitation  ONSET DATE: 10/24/12 (date of surgery).  SUBJECTIVE:   SUBJECTIVE STATEMENT: The patient presents to the clinic today s/p right total knee replacement performed on 10/14/22.  She had an ED visit on 10/18/22 due to twirsting her knee.  X-rays were negative.  Her pain today at rest is a 2/10.  She had some home health PT and is doing the HEP.   Increased up time and bending the knee increases pain.  Resting decreases pain.    PERTINENT HISTORY: Left total knee and total hip replacement. PAIN:  Are you having pain? Yes: NPRS scale: 2/10 Pain location: Right knee. Pain description: "Stiffness." Aggravating factors: As above. Relieving factors: As above.  PRECAUTIONS: Other: No ultrasound.  WEIGHT BEARING RESTRICTIONS: No  FALLS:  Has patient fallen in last 6 months? No  LIVING ENVIRONMENT: Lives with: lives with their family Lives in: House/apartment Stairs: 3 steps with railing. Has following equipment at home: Single point cane  OCCUPATION: Retired.  PLOF: Independent  PATIENT GOALS: Not have right knee pain and do more.   OBJECTIVE:  PATIENT SURVEYS:  FOTO 16.    EDEMA:  Circumferential: 6 cms right > left.  PALPATION: Mild diffuse right anterior knee pain.  LOWER EXTREMITY ROM:  Right knee extension in supine to -2 degrees with active flexion to 95 degrees and gentle passive to 100 degrees.  LOWER EXTREMITY MMT:  Right hip flexion is 4-/5 and right knee extension is 4/5.   GAIT: Safe gait with decreased step and stride length with a straight cane.   TODAY'S TREATMENT:                                                                                                                              DATE: Nustep x 8 minutes f/b Vasopneumatic x 15  minutes to patient's right knee with LE elevation.  Patient will benefit from skilled physical therapy intervention to address pain and deficits.  ASSESSMENT:  CLINICAL IMPRESSION: The patient presents to OPPT s/p right total knee replacement performed on 10/14/22.  She is pleased with her progress.  She is walking safely with a straight cane.  She has a moderate amount of edema currently.  She is lacking some range of motion and strength.  Her FOTO limitation score  is a 78.  Patient will benefit from skilled physical therapy intervention to address pain and deficits.  OBJECTIVE IMPAIRMENTS: Abnormal gait, decreased activity tolerance, decreased ROM, decreased strength, increased edema, and pain.   ACTIVITY LIMITATIONS: locomotion level  PARTICIPATION LIMITATIONS: cleaning and laundry  REHAB POTENTIAL: Excellent  CLINICAL DECISION MAKING: Stable/uncomplicated  EVALUATION COMPLEXITY: Low   GOALS:  SHORT TERM GOALS: Target date: 11/24/22 Ind with an initial HEP. Goal status: INITIAL  2.  Full active right knee extension.  Goal status: INITIAL   LONG TERM GOALS: Target date: 02/08/23.  Ind with an advanced HEP.  Goal status: INITIAL  2.  Active knee flexion to 120-125 degrees+ so the patient can perform functional tasks and do so with pain not > 2-3/10.  Goal status: INITIAL  3.  Increase right hip and knee strength to a solid 5/5 to provide good stability for accomplishment of functional activities.  Goal status: INITIAL  4.  Decrease edema to within 2.5 cms of non-affected side to assist with pain reduction and range of motion gains.  Goal status:  INITIAL  5.  Perform a reciprocating stair gait with one railing with pain not > 2-3/10.  Goal status: INITIAL  PLAN:  PT FREQUENCY: 2x/week  PT DURATION: 6 weeks  PLANNED INTERVENTIONS: Therapeutic exercises, Therapeutic activity, Neuromuscular re-education, Gait training, Patient/Family education, Self Care, Electrical  stimulation, Cryotherapy, Moist heat, Vasopneumatic device, and Manual therapy  PLAN FOR NEXT SESSION: Nustep with progression to recumbent bike.  Progress into TKA protocol.  Vasopneumatic.   Theo Krumholz, [place], PT 11/10/2022, 11:02 AM

## 2022-11-12 ENCOUNTER — Ambulatory Visit: Payer: Medicare HMO | Admitting: *Deleted

## 2022-11-12 ENCOUNTER — Encounter: Payer: Self-pay | Admitting: *Deleted

## 2022-11-12 DIAGNOSIS — M25661 Stiffness of right knee, not elsewhere classified: Secondary | ICD-10-CM

## 2022-11-12 DIAGNOSIS — G8929 Other chronic pain: Secondary | ICD-10-CM | POA: Diagnosis not present

## 2022-11-12 DIAGNOSIS — R600 Localized edema: Secondary | ICD-10-CM | POA: Diagnosis not present

## 2022-11-12 DIAGNOSIS — M1711 Unilateral primary osteoarthritis, right knee: Secondary | ICD-10-CM | POA: Diagnosis not present

## 2022-11-12 DIAGNOSIS — M25561 Pain in right knee: Secondary | ICD-10-CM | POA: Diagnosis not present

## 2022-11-12 DIAGNOSIS — M6281 Muscle weakness (generalized): Secondary | ICD-10-CM

## 2022-11-12 DIAGNOSIS — Z96651 Presence of right artificial knee joint: Secondary | ICD-10-CM | POA: Diagnosis not present

## 2022-11-12 NOTE — Therapy (Signed)
OUTPATIENT PHYSICAL THERAPY LOWER EXTREMITY EVALUATION   Patient Name: Tiffany Bradley MRN: [id number] DOB:Aug 08, 1946, 77 y.o., female Today's Date: 11/12/2022  END OF SESSION:  PT End of Session - 11/12/22 0958     Visit Number 2    Number of Visits 12    Date for PT Re-Evaluation 02/08/23    Authorization Type FOTO AT LEAST EVERY 5TH VISIT.  PROGRESS NOTE AT 10TH VISIT.  KX MODIFIER AFTER 15 VISITS.    PT Start Time [phone]    PT Stop Time 1040    PT Time Calculation (min) 45 min             Past Medical History:  Diagnosis Date   Arthritis    Back pain    Cataract    DJD (degenerative joint disease)    Full dentures    GERD (gastroesophageal reflux disease)    Glaucoma    History of bleeding ulcers    patient reports multiple hospitalizations remotely   History of total left hip replacement    Hypertension    Pre-diabetes    Wears glasses    Past Surgical History:  Procedure Laterality Date   BREAST REDUCTION SURGERY Bilateral 04/25/2013   Procedure: MAMMARY REDUCTION  (BREAST);  Surgeon: Cristine Polio, MD;  Location: [place];  Service: Plastics;  Laterality: Bilateral;   COLONOSCOPY     2008, normal per patient   COLONOSCOPY N/A 01/16/2017   Procedure: COLONOSCOPY;  Surgeon: Danie Binder, MD;  [facility];  Service: Endoscopy;  Laterality: N/A;  830    COSMETIC SURGERY     ESOPHAGOGASTRODUODENOSCOPY N/A 01/16/2017   Procedure: ESOPHAGOGASTRODUODENOSCOPY (EGD);  Surgeon: Danie Binder, MD;  [facility];  Service: Endoscopy;  Laterality: N/A;   PARTIAL COLECTOMY N/A 06/29/2016   Procedure: PARTIAL COLECTOMY;  Surgeon: Vickie Epley, MD;  [facility];  Service: General;  Laterality: N/A;   REDUCTION MAMMAPLASTY     TOTAL HIP ARTHROPLASTY Left 02/28/2022   Procedure: LEFT TOTAL HIP ARTHROPLASTY ANTERIOR APPROACH;  Surgeon: Mcarthur Rossetti, MD;  [facility];  Service: Orthopedics;  Laterality: Left;    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   Procedure: TOTAL KNEE ARTHROPLASTY;  Surgeon: Carole Civil, MD;  Location: [facility];  Service: Orthopedics;  Laterality: Left;  DePuy    TOTAL KNEE ARTHROPLASTY Right 10/14/2022   Procedure: TOTAL KNEE ARTHROPLASTY;  Surgeon: Carole Civil, MD;  Location: [facility];  Service: Orthopedics;  Laterality: Right;   TUBAL LIGATION     Patient Active Problem List   Diagnosis Date Noted   Unilateral primary osteoarthritis, right knee 10/14/2022   Primary osteoarthritis of right knee 10/14/2022   Status post left hip replacement 02/28/2022   Small bowel obstruction ([place]) 12/13/2021   Hyperglycemia 12/13/2021   Unilateral primary osteoarthritis, left hip 12/12/2021   Viral upper respiratory tract infection 03/05/2021   History of retinal detachment 03/12/2020   Pure hypercholesterolemia 03/12/2020   PUD (peptic ulcer disease) 05/09/2019   OAB (overactive bladder) 05/09/2019   Lumbar back pain 05/09/2019   DDD (degenerative disc disease), lumbar 05/09/2019   Status post total knee replacement, 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 02/11/2018   Iron deficiency anemia 04/28/2017   Vitamin D deficiency 04/28/2017   Osteopenia of lumbar spine 04/28/2017   Gastritis, erosive    Abdominal pain, epigastric 12/12/2016   Colon cancer screening 11/03/2016   Essential hypertension 11/03/2016   Primary osteoarthritis of both knees 11/03/2016  GERD (gastroesophageal reflux disease) 11/03/2016   Age-related osteoporosis without current pathological fracture 11/03/2016   Perforation of sigmoid colon ([place]) 06/29/2016   REFERRING PROVIDER: Arther Abbott MD  REFERRING DIAG: S/p right total knee replacement.  THERAPY DIAG:  Chronic pain of right knee  Muscle weakness (generalized)  Stiffness of right knee, not elsewhere classified  Rationale for Evaluation and Treatment: Rehabilitation  ONSET DATE: 10/24/12 (date of surgery).  SUBJECTIVE:   SUBJECTIVE STATEMENT: RT  knee feels tight and stiff    PERTINENT HISTORY: Left total knee and total hip replacement. PAIN:  Are you having pain? Yes: NPRS scale: 2/10 Pain location: Right knee. Pain description: "Stiffness." Aggravating factors: As above. Relieving factors: As above.  PRECAUTIONS: Other: No ultrasound.  WEIGHT BEARING RESTRICTIONS: No  FALLS:  Has patient fallen in last 6 months? No  LIVING ENVIRONMENT: Lives with: lives with their family Lives in: House/apartment Stairs: 3 steps with railing. Has following equipment at home: Single point cane  OCCUPATION: Retired.  PLOF: Independent  PATIENT GOALS: Not have right knee pain and do more.   OBJECTIVE:  PATIENT SURVEYS:  FOTO 59.    EDEMA:  Circumferential: 6 cms right > left.  PALPATION: Mild diffuse right anterior knee pain.  LOWER EXTREMITY ROM:  Right knee extension in supine to -2 degrees with active flexion to 95 degrees and gentle passive to 100 degrees.  LOWER EXTREMITY MMT:  Right hip flexion is 4-/5 and right knee extension is 4/5.   GAIT: Safe gait with decreased step and stride length with a straight cane.   TODAY'S TREATMENT:                                                                                                                              DATE:                                                                       11-12-22                                    EXERCISE LOG  Exercise Repetitions and Resistance Comments  Nustep L3 seat 10 x  13   mins   Rocker board X 3 mins    8in box lunges 2x10 flexion stretch   LAQs 2# 2x10        Blank cell = exercise not performed today  Manual PROM for extension ROM  Vasopneumatic x 15 minutes to patient's right knee with LE elevation. ASSESSMENT:  CLINICAL IMPRESSION: The patient arrived today doing fairly well with RT knee and was able to perform standing as well as  seated strengthening exs. Manual PROM also performed and tolerated well. Pt still  with notable extensor lag. Vaso end of session. OBJECTIVE IMPAIRMENTS: Abnormal gait, decreased activity tolerance, decreased ROM, decreased strength, increased edema, and pain.   ACTIVITY LIMITATIONS: locomotion level  PARTICIPATION LIMITATIONS: cleaning and laundry  REHAB POTENTIAL: Excellent  CLINICAL DECISION MAKING: Stable/uncomplicated  EVALUATION COMPLEXITY: Low   GOALS:  SHORT TERM GOALS: Target date: 11/24/22 Ind with an initial HEP. Goal status: INITIAL  2.  Full active right knee extension.  Goal status: INITIAL   LONG TERM GOALS: Target date: 02/08/23.  Ind with an advanced HEP.  Goal status: INITIAL  2.  Active knee flexion to 120-125 degrees+ so the patient can perform functional tasks and do so with pain not > 2-3/10.  Goal status: INITIAL  3.  Increase right hip and knee strength to a solid 5/5 to provide good stability for accomplishment of functional activities.  Goal status: INITIAL  4.  Decrease edema to within 2.5 cms of non-affected side to assist with pain reduction and range of motion gains.  Goal status:  INITIAL  5.  Perform a reciprocating stair gait with one railing with pain not > 2-3/10.  Goal status: INITIAL  PLAN:  PT FREQUENCY: 2x/week  PT DURATION: 6 weeks  PLANNED INTERVENTIONS: Therapeutic exercises, Therapeutic activity, Neuromuscular re-education, Gait training, Patient/Family education, Self Care, Electrical stimulation, Cryotherapy, Moist heat, Vasopneumatic device, and Manual therapy  PLAN FOR NEXT SESSION: Nustep with progression to recumbent bike.  Progress into TKA protocol.  Vasopneumatic.   Aniello Christopoulos,CHRIS, PTA 11/12/2022, 12:23 PM

## 2022-11-17 ENCOUNTER — Ambulatory Visit: Payer: Medicare HMO

## 2022-11-17 DIAGNOSIS — G8929 Other chronic pain: Secondary | ICD-10-CM

## 2022-11-17 DIAGNOSIS — M25661 Stiffness of right knee, not elsewhere classified: Secondary | ICD-10-CM | POA: Diagnosis not present

## 2022-11-17 DIAGNOSIS — Z96651 Presence of right artificial knee joint: Secondary | ICD-10-CM | POA: Diagnosis not present

## 2022-11-17 DIAGNOSIS — R600 Localized edema: Secondary | ICD-10-CM

## 2022-11-17 DIAGNOSIS — M6281 Muscle weakness (generalized): Secondary | ICD-10-CM

## 2022-11-17 DIAGNOSIS — M25561 Pain in right knee: Secondary | ICD-10-CM | POA: Diagnosis not present

## 2022-11-17 DIAGNOSIS — M1711 Unilateral primary osteoarthritis, right knee: Secondary | ICD-10-CM | POA: Diagnosis not present

## 2022-11-17 NOTE — Therapy (Signed)
OUTPATIENT PHYSICAL THERAPY LOWER EXTREMITY TREATMENT   Patient Name: Tiffany Bradley MRN: [id number] DOB:1946-01-27, 77 y.o., female Today's Date: 11/17/2022  END OF SESSION:  PT End of Session - 11/17/22 0947     Visit Number 3    Number of Visits 12    Date for PT Re-Evaluation 02/08/23    Authorization Type FOTO AT LEAST EVERY 5TH VISIT.  PROGRESS NOTE AT 10TH VISIT.  KX MODIFIER AFTER 15 VISITS.    PT Start Time 0945    PT Stop Time 1045    PT Time Calculation (min) 60 min             Past Medical History:  Diagnosis Date   Arthritis    Back pain    Cataract    DJD (degenerative joint disease)    Full dentures    GERD (gastroesophageal reflux disease)    Glaucoma    History of bleeding ulcers    patient reports multiple hospitalizations remotely   History of total left hip replacement    Hypertension    Pre-diabetes    Wears glasses    Past Surgical History:  Procedure Laterality Date   BREAST REDUCTION SURGERY Bilateral 04/25/2013   Procedure: MAMMARY REDUCTION  (BREAST);  Surgeon: Cristine Polio, MD;  Location: [place];  Service: Plastics;  Laterality: Bilateral;   COLONOSCOPY     2008, normal per patient   COLONOSCOPY N/A 01/16/2017   Procedure: COLONOSCOPY;  Surgeon: Danie Binder, MD;  [facility];  Service: Endoscopy;  Laterality: N/A;  830    COSMETIC SURGERY     ESOPHAGOGASTRODUODENOSCOPY N/A 01/16/2017   Procedure: ESOPHAGOGASTRODUODENOSCOPY (EGD);  Surgeon: Danie Binder, MD;  [facility];  Service: Endoscopy;  Laterality: N/A;   PARTIAL COLECTOMY N/A 06/29/2016   Procedure: PARTIAL COLECTOMY;  Surgeon: Vickie Epley, MD;  [facility];  Service: General;  Laterality: N/A;   REDUCTION MAMMAPLASTY     TOTAL HIP ARTHROPLASTY Left 02/28/2022   Procedure: LEFT TOTAL HIP ARTHROPLASTY ANTERIOR APPROACH;  Surgeon: Mcarthur Rossetti, MD;  [facility];  Service: Orthopedics;  Laterality: Left;    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   Procedure: TOTAL KNEE ARTHROPLASTY;  Surgeon: Carole Civil, MD;  [facility];  Service: Orthopedics;  Laterality: Left;  DePuy    TOTAL KNEE ARTHROPLASTY Right 10/14/2022   Procedure: TOTAL KNEE ARTHROPLASTY;  Surgeon: Carole Civil, MD;  [facility];  Service: Orthopedics;  Laterality: Right;   TUBAL LIGATION     Patient Active Problem List   Diagnosis Date Noted   Unilateral primary osteoarthritis, right knee 10/14/2022   Primary osteoarthritis of right knee 10/14/2022   Status post left hip replacement 02/28/2022   Small bowel obstruction ([place]) 12/13/2021   Hyperglycemia 12/13/2021   Unilateral primary osteoarthritis, left hip 12/12/2021   Viral upper respiratory tract infection 03/05/2021   History of retinal detachment 03/12/2020   Pure hypercholesterolemia 03/12/2020   PUD (peptic ulcer disease) 05/09/2019   OAB (overactive bladder) 05/09/2019   Lumbar back pain 05/09/2019   DDD (degenerative disc disease), lumbar 05/09/2019   Status post total knee replacement, 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 02/11/2018   Iron deficiency anemia 04/28/2017   Vitamin D deficiency 04/28/2017   Osteopenia of lumbar spine 04/28/2017   Gastritis, erosive    Abdominal pain, epigastric 12/12/2016   Colon cancer screening 11/03/2016   Essential hypertension 11/03/2016   Primary osteoarthritis of both knees 11/03/2016  GERD (gastroesophageal reflux disease) 11/03/2016   Age-related osteoporosis without current pathological fracture 11/03/2016   Perforation of sigmoid colon ([place]) 06/29/2016   REFERRING PROVIDER: Arther Abbott MD  REFERRING DIAG: S/p right total knee replacement.  THERAPY DIAG:  Chronic pain of right knee  Muscle weakness (generalized)  Stiffness of right knee, not elsewhere classified  Localized edema  Rationale for Evaluation and Treatment: Rehabilitation  ONSET DATE: 10/24/12 (date of surgery).  SUBJECTIVE:    SUBJECTIVE STATEMENT: Pt denies any pain, but reports that right knee feels stiff this morning.  PERTINENT HISTORY: Left total knee and total hip replacement. PAIN:  Are you having pain? No  PRECAUTIONS: Other: No ultrasound.  WEIGHT BEARING RESTRICTIONS: No  FALLS:  Has patient fallen in last 6 months? No  LIVING ENVIRONMENT: Lives with: lives with their family Lives in: House/apartment Stairs: 3 steps with railing. Has following equipment at home: Single point cane  OCCUPATION: Retired.  PLOF: Independent  PATIENT GOALS: Not have right knee pain and do more.   OBJECTIVE:  PATIENT SURVEYS:  FOTO 49.    EDEMA:  Circumferential: 6 cms right > left.  PALPATION: Mild diffuse right anterior knee pain.  LOWER EXTREMITY ROM:  Right knee extension in supine to -2 degrees with active flexion to 95 degrees and gentle passive to 100 degrees.  LOWER EXTREMITY MMT:  Right hip flexion is 4-/5 and right knee extension is 4/5.   GAIT: Safe gait with decreased step and stride length with a straight cane.   TODAY'S TREATMENT:                                                                                                                              DATE:                                                                       11-17-22                                    EXERCISE LOG  Exercise Repetitions and Resistance Comments  Nustep L3 seat 7 x  15   mins   Rocker board x3 mins    8in box lunges 8" box x 2 mins   Forward Step Ups 6" x 20 reps bil   LAQs 2# x20 reps bil   Seated Marches 2# x20 reps bil   Ball Squeeze X2.5 mins   Ham Curls  Red x 20 reps bil   Seated Hip Abduction Red x 2 mins    Blank cell = exercise not performed today   Vasopneumatic x 15 minutes to patient's right  knee with LE elevation.   ASSESSMENT:  CLINICAL IMPRESSION: Pt arrives for today's treatment session denying any pain.  Pt able to progress to seat 7 on Nustep today without  issue.  Pt introduced to forward step ups today with min cues required for sequencing.  Pt able to tolerate addition of several seated exercises today with min cues for proper technique and to avoid leaning with LAQs and seated marches.  Normal responses to vaso noted upon removal.  Pt denied any pain at completion of today's treatment session.   OBJECTIVE IMPAIRMENTS: Abnormal gait, decreased activity tolerance, decreased ROM, decreased strength, increased edema, and pain.   ACTIVITY LIMITATIONS: locomotion level  PARTICIPATION LIMITATIONS: cleaning and laundry  REHAB POTENTIAL: Excellent  CLINICAL DECISION MAKING: Stable/uncomplicated  EVALUATION COMPLEXITY: Low   GOALS:  SHORT TERM GOALS: Target date: 11/24/22 Ind with an initial HEP. Goal status: INITIAL  2.  Full active right knee extension.  Goal status: INITIAL   LONG TERM GOALS: Target date: 02/08/23.  Ind with an advanced HEP.  Goal status: INITIAL  2.  Active knee flexion to 120-125 degrees+ so the patient can perform functional tasks and do so with pain not > 2-3/10.  Goal status: INITIAL  3.  Increase right hip and knee strength to a solid 5/5 to provide good stability for accomplishment of functional activities.  Goal status: INITIAL  4.  Decrease edema to within 2.5 cms of non-affected side to assist with pain reduction and range of motion gains.  Goal status:  INITIAL  5.  Perform a reciprocating stair gait with one railing with pain not > 2-3/10.  Goal status: INITIAL  PLAN:  PT FREQUENCY: 2x/week  PT DURATION: 6 weeks  PLANNED INTERVENTIONS: Therapeutic exercises, Therapeutic activity, Neuromuscular re-education, Gait training, Patient/Family education, Self Care, Electrical stimulation, Cryotherapy, Moist heat, Vasopneumatic device, and Manual therapy  PLAN FOR NEXT SESSION: Nustep with progression to recumbent bike.  Progress into TKA protocol.  Vasopneumatic.   Kathrynn Ducking, PTA 11/17/2022,  10:49 AM

## 2022-11-19 ENCOUNTER — Ambulatory Visit: Payer: Medicare HMO

## 2022-11-19 DIAGNOSIS — G8929 Other chronic pain: Secondary | ICD-10-CM

## 2022-11-19 DIAGNOSIS — M25661 Stiffness of right knee, not elsewhere classified: Secondary | ICD-10-CM | POA: Diagnosis not present

## 2022-11-19 DIAGNOSIS — M6281 Muscle weakness (generalized): Secondary | ICD-10-CM | POA: Diagnosis not present

## 2022-11-19 DIAGNOSIS — Z96651 Presence of right artificial knee joint: Secondary | ICD-10-CM | POA: Diagnosis not present

## 2022-11-19 DIAGNOSIS — R600 Localized edema: Secondary | ICD-10-CM

## 2022-11-19 DIAGNOSIS — M1711 Unilateral primary osteoarthritis, right knee: Secondary | ICD-10-CM | POA: Diagnosis not present

## 2022-11-19 DIAGNOSIS — M25561 Pain in right knee: Secondary | ICD-10-CM | POA: Diagnosis not present

## 2022-11-19 NOTE — Therapy (Signed)
OUTPATIENT PHYSICAL THERAPY LOWER EXTREMITY TREATMENT   Patient Name: Tiffany Bradley MRN: [id number] DOB:04-26-1946, 77 y.o., female Today's Date: 11/19/2022  END OF SESSION:  PT End of Session - 11/19/22 0949     Visit Number 4    Number of Visits 12    Date for PT Re-Evaluation 02/08/23    Authorization Type FOTO AT LEAST EVERY 5TH VISIT.  PROGRESS NOTE AT 10TH VISIT.  KX MODIFIER AFTER 15 VISITS.    PT Start Time 0945    PT Stop Time 1042    PT Time Calculation (min) 57 min    Activity Tolerance Patient tolerated treatment well    Behavior During Therapy WFL for tasks assessed/performed             Past Medical History:  Diagnosis Date   Arthritis    Back pain    Cataract    DJD (degenerative joint disease)    Full dentures    GERD (gastroesophageal reflux disease)    Glaucoma    History of bleeding ulcers    patient reports multiple hospitalizations remotely   History of total left hip replacement    Hypertension    Pre-diabetes    Wears glasses    Past Surgical History:  Procedure Laterality Date   BREAST REDUCTION SURGERY Bilateral 04/25/2013   Procedure: MAMMARY REDUCTION  (BREAST);  Surgeon: Cristine Polio, MD;  Location: [facility];  Service: Plastics;  Laterality: Bilateral;   COLONOSCOPY     2008, normal per patient   COLONOSCOPY N/A 01/16/2017   Procedure: COLONOSCOPY;  Surgeon: Danie Binder, MD;  [facility];  Service: Endoscopy;  Laterality: N/A;  830    COSMETIC SURGERY     ESOPHAGOGASTRODUODENOSCOPY N/A 01/16/2017   Procedure: ESOPHAGOGASTRODUODENOSCOPY (EGD);  Surgeon: Danie Binder, MD;  [facility];  Service: Endoscopy;  Laterality: N/A;   PARTIAL COLECTOMY N/A 06/29/2016   Procedure: PARTIAL COLECTOMY;  Surgeon: Vickie Epley, MD;  [facility];  Service: General;  Laterality: N/A;   REDUCTION MAMMAPLASTY     TOTAL HIP ARTHROPLASTY Left 02/28/2022   Procedure: LEFT TOTAL HIP ARTHROPLASTY  ANTERIOR APPROACH;  Surgeon: Mcarthur Rossetti, MD;  [facility];  Service: Orthopedics;  Laterality: Left;   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   Procedure: TOTAL KNEE ARTHROPLASTY;  Surgeon: Carole Civil, MD;  [facility];  Service: Orthopedics;  Laterality: Left;  DePuy    TOTAL KNEE ARTHROPLASTY Right 10/14/2022   Procedure: TOTAL KNEE ARTHROPLASTY;  Surgeon: Carole Civil, MD;  [facility];  Service: Orthopedics;  Laterality: Right;   TUBAL LIGATION     Patient Active Problem List   Diagnosis Date Noted   Unilateral primary osteoarthritis, right knee 10/14/2022   Primary osteoarthritis of right knee 10/14/2022   Status post left hip replacement 02/28/2022   Small bowel obstruction ([place]) 12/13/2021   Hyperglycemia 12/13/2021   Unilateral primary osteoarthritis, left hip 12/12/2021   Viral upper respiratory tract infection 03/05/2021   History of retinal detachment 03/12/2020   Pure hypercholesterolemia 03/12/2020   PUD (peptic ulcer disease) 05/09/2019   OAB (overactive bladder) 05/09/2019   Lumbar back pain 05/09/2019   DDD (degenerative disc disease), lumbar 05/09/2019   Status post total knee replacement, 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 02/11/2018   Iron deficiency anemia 04/28/2017   Vitamin D deficiency 04/28/2017   Osteopenia of lumbar spine 04/28/2017   Gastritis, erosive    Abdominal pain, epigastric 12/12/2016  Colon cancer screening 11/03/2016   Essential hypertension 11/03/2016   Primary osteoarthritis of both knees 11/03/2016   GERD (gastroesophageal reflux disease) 11/03/2016   Age-related osteoporosis without current pathological fracture 11/03/2016   Perforation of sigmoid colon ([place]) 06/29/2016   REFERRING PROVIDER: Arther Abbott MD  REFERRING DIAG: S/p right total knee replacement.  THERAPY DIAG:  Chronic pain of right knee  Muscle weakness (generalized)  Stiffness of right knee, not elsewhere classified  Localized  edema  Rationale for Evaluation and Treatment: Rehabilitation  ONSET DATE: 10/24/12 (date of surgery).  SUBJECTIVE:   SUBJECTIVE STATEMENT: Patient reports that her knee is not hurting, but it is stiff.   PERTINENT HISTORY: Left total knee and total hip replacement. PAIN:  Are you having pain? No  PRECAUTIONS: Other: No ultrasound.  WEIGHT BEARING RESTRICTIONS: No  FALLS:  Has patient fallen in last 6 months? No  LIVING ENVIRONMENT: Lives with: lives with their family Lives in: House/apartment Stairs: 3 steps with railing. Has following equipment at home: Single point cane  OCCUPATION: Retired.  PLOF: Independent  PATIENT GOALS: Not have right knee pain and do more.   OBJECTIVE:  PATIENT SURVEYS:  FOTO 42.    EDEMA:  Circumferential: 6 cms right > left.  PALPATION: Mild diffuse right anterior knee pain.  LOWER EXTREMITY ROM:  Right knee extension in supine to -2 degrees with active flexion to 95 degrees and gentle passive to 100 degrees.  LOWER EXTREMITY MMT:  Right hip flexion is 4-/5 and right knee extension is 4/5.   GAIT: Safe gait with decreased step and stride length with a straight cane.   TODAY'S TREATMENT:                                                                                                                              DATE:                                                                                                          3/20 EXERCISE LOG  Exercise Repetitions and Resistance Comments  Nustep L3 x 16 minutes; seat 8-6   LAQ 2# x 2 minutes    Seated HS curl  Green t-band x 30 reps    Standing hip ABD 20 reps each    Rocker board  4 minutes    Lunge onto step  14" x 2 minutes    Standing HS stretch 3 x 30 seconds   Gastroc stretch  3 x 30 seconds    Blank  cell = exercise not performed today  Modalities  Date:  Vaso: Knee, 34 degrees; low pressure, 15 mins, Pain                                    11-17-22 EXERCISE  LOG  Exercise Repetitions and Resistance Comments  Nustep L3 seat 7 x  15   mins   Rocker board x3 mins    8in box lunges 8" box x 2 mins   Forward Step Ups 6" x 20 reps bil   LAQs 2# x20 reps bil   Seated Marches 2# x20 reps bil   Ball Squeeze X2.5 mins   Ham Curls  Red x 20 reps bil   Seated Hip Abduction Red x 2 mins    Blank cell = exercise not performed today   Vasopneumatic x 15 minutes to patient's right knee with LE elevation.   ASSESSMENT:  CLINICAL IMPRESSION: Patient was progressed with standing hip abduction for hip abductor strengthening and improved stability in single leg stance. She required minimal cueing with this intervention for prevent trunk movement to isolate hip abductor engagement. She experienced no significant pain or discomfort with any of today's interventions. She reported that her knee felt better upon the conclusion of treatment. She continues to require skilled physical therapy to address her remaining impairments to return to her prior level of function.   OBJECTIVE IMPAIRMENTS: Abnormal gait, decreased activity tolerance, decreased ROM, decreased strength, increased edema, and pain.   ACTIVITY LIMITATIONS: locomotion level  PARTICIPATION LIMITATIONS: cleaning and laundry  REHAB POTENTIAL: Excellent  CLINICAL DECISION MAKING: Stable/uncomplicated  EVALUATION COMPLEXITY: Low   GOALS:  SHORT TERM GOALS: Target date: 11/24/22 Ind with an initial HEP. Goal status: INITIAL  2.  Full active right knee extension.  Goal status: INITIAL   LONG TERM GOALS: Target date: 02/08/23.  Ind with an advanced HEP.  Goal status: INITIAL  2.  Active knee flexion to 120-125 degrees+ so the patient can perform functional tasks and do so with pain not > 2-3/10.  Goal status: INITIAL  3.  Increase right hip and knee strength to a solid 5/5 to provide good stability for accomplishment of functional activities.  Goal status: INITIAL  4.  Decrease edema to  within 2.5 cms of non-affected side to assist with pain reduction and range of motion gains.  Goal status:  INITIAL  5.  Perform a reciprocating stair gait with one railing with pain not > 2-3/10.  Goal status: INITIAL  PLAN:  PT FREQUENCY: 2x/week  PT DURATION: 6 weeks  PLANNED INTERVENTIONS: Therapeutic exercises, Therapeutic activity, Neuromuscular re-education, Gait training, Patient/Family education, Self Care, Electrical stimulation, Cryotherapy, Moist heat, Vasopneumatic device, and Manual therapy  PLAN FOR NEXT SESSION: Nustep with progression to recumbent bike.  Progress into TKA protocol.  Vasopneumatic.   Darlin Coco, PT 11/19/2022, 3:11 PM

## 2022-11-21 ENCOUNTER — Encounter: Payer: Medicare HMO | Admitting: Orthopedic Surgery

## 2022-11-21 DIAGNOSIS — Z96651 Presence of right artificial knee joint: Secondary | ICD-10-CM | POA: Insufficient documentation

## 2022-11-24 ENCOUNTER — Ambulatory Visit (INDEPENDENT_AMBULATORY_CARE_PROVIDER_SITE_OTHER): Payer: Medicare HMO | Admitting: Orthopedic Surgery

## 2022-11-24 ENCOUNTER — Encounter: Payer: Self-pay | Admitting: Orthopedic Surgery

## 2022-11-24 DIAGNOSIS — G8918 Other acute postprocedural pain: Secondary | ICD-10-CM

## 2022-11-24 DIAGNOSIS — Z96651 Presence of right artificial knee joint: Secondary | ICD-10-CM

## 2022-11-24 MED ORDER — HYDROCODONE-ACETAMINOPHEN 5-325 MG PO TABS: 1.0000 | ORAL_TABLET | Freq: Three times a day (TID) | ORAL | 0 refills | Status: AC | PRN

## 2022-11-24 NOTE — Progress Notes (Signed)
Chief Complaint  Patient presents with   Post-op Follow-up    Right TKR 10/16/22 improving but c/o swelling today     Right knee extension in supine to -2 degrees with active flexion to 95 degrees and gentle passive to 100 degrees.   Postop day 39 after right total knee.  Only complaint is that she is having some swelling today.  She says when the swelling comes she ices it and it goes down  I measured her range of motion is 105 degrees today.  She is using her cane she is doing better and better each day  Recommend ice as needed.  Continue opioid taper.  Follow-up in 5 weeks  Continue physical therapy  Meds ordered this encounter  Medications   HYDROcodone-acetaminophen (NORCO/VICODIN) 5-325 MG tablet    Sig: Take 1 tablet by mouth every 8 (eight) hours as needed for up to 5 days for moderate pain.    Dispense:  15 tablet    Refill:  0

## 2022-11-25 ENCOUNTER — Ambulatory Visit: Payer: Medicare HMO | Admitting: Physical Therapy

## 2022-11-25 ENCOUNTER — Encounter: Payer: Self-pay | Admitting: Physical Therapy

## 2022-11-25 DIAGNOSIS — M25561 Pain in right knee: Secondary | ICD-10-CM | POA: Diagnosis not present

## 2022-11-25 DIAGNOSIS — M1711 Unilateral primary osteoarthritis, right knee: Secondary | ICD-10-CM | POA: Diagnosis not present

## 2022-11-25 DIAGNOSIS — M6281 Muscle weakness (generalized): Secondary | ICD-10-CM | POA: Diagnosis not present

## 2022-11-25 DIAGNOSIS — R600 Localized edema: Secondary | ICD-10-CM

## 2022-11-25 DIAGNOSIS — M25661 Stiffness of right knee, not elsewhere classified: Secondary | ICD-10-CM

## 2022-11-25 DIAGNOSIS — G8929 Other chronic pain: Secondary | ICD-10-CM | POA: Diagnosis not present

## 2022-11-25 DIAGNOSIS — Z96651 Presence of right artificial knee joint: Secondary | ICD-10-CM | POA: Diagnosis not present

## 2022-11-25 NOTE — Therapy (Signed)
OUTPATIENT PHYSICAL THERAPY LOWER EXTREMITY TREATMENT   Patient Name: Tiffany Bradley MRN: [id number] DOB:February 12, 1946, 77 y.o., female Today's Date: 11/25/2022  END OF SESSION:  PT End of Session - 11/25/22 0947     Visit Number 5    Number of Visits 12    Date for PT Re-Evaluation 02/08/23    Authorization Type FOTO AT LEAST EVERY 5TH VISIT.  PROGRESS NOTE AT 10TH VISIT.  KX MODIFIER AFTER 15 VISITS.    PT Start Time [phone]    PT Stop Time 1028    PT Time Calculation (min) 41 min    Activity Tolerance Patient tolerated treatment well    Behavior During Therapy WFL for tasks assessed/performed            Past Medical History:  Diagnosis Date   Arthritis    Back pain    Cataract    DJD (degenerative joint disease)    Full dentures    GERD (gastroesophageal reflux disease)    Glaucoma    History of bleeding ulcers    patient reports multiple hospitalizations remotely   History of total left hip replacement    Hypertension    Pre-diabetes    Wears glasses    Past Surgical History:  Procedure Laterality Date   BREAST REDUCTION SURGERY Bilateral 04/25/2013   Procedure: MAMMARY REDUCTION  (BREAST);  Surgeon: Cristine Polio, MD;  Location: [place];  Service: Plastics;  Laterality: Bilateral;   COLONOSCOPY     2008, normal per patient   COLONOSCOPY N/A 01/16/2017   Procedure: COLONOSCOPY;  Surgeon: Danie Binder, MD;  [facility];  Service: Endoscopy;  Laterality: N/A;  830    COSMETIC SURGERY     ESOPHAGOGASTRODUODENOSCOPY N/A 01/16/2017   Procedure: ESOPHAGOGASTRODUODENOSCOPY (EGD);  Surgeon: Danie Binder, MD;  [facility];  Service: Endoscopy;  Laterality: N/A;   PARTIAL COLECTOMY N/A 06/29/2016   Procedure: PARTIAL COLECTOMY;  Surgeon: Vickie Epley, MD;  [facility];  Service: General;  Laterality: N/A;   REDUCTION MAMMAPLASTY     TOTAL HIP ARTHROPLASTY Left 02/28/2022   Procedure: LEFT TOTAL HIP ARTHROPLASTY  ANTERIOR APPROACH;  Surgeon: Mcarthur Rossetti, MD;  [facility];  Service: Orthopedics;  Laterality: Left;   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   Procedure: TOTAL KNEE ARTHROPLASTY;  Surgeon: Carole Civil, MD;  Location: [facility];  Service: Orthopedics;  Laterality: Left;  DePuy    TOTAL KNEE ARTHROPLASTY Right 10/14/2022   Procedure: TOTAL KNEE ARTHROPLASTY;  Surgeon: Carole Civil, MD;  [facility]: [facility];  Service: Orthopedics;  Laterality: Right;   TUBAL LIGATION     Patient Active Problem List   Diagnosis Date Noted   S/P TKR (total knee replacement), 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 11/21/2022   Unilateral primary osteoarthritis, right knee 10/14/2022   Primary osteoarthritis of right knee 10/14/2022   Status post left hip replacement 02/28/2022   Small bowel obstruction ([place]) 12/13/2021   Hyperglycemia 12/13/2021   Unilateral primary osteoarthritis, left hip 12/12/2021   Viral upper respiratory tract infection 03/05/2021   History of retinal detachment 03/12/2020   Pure hypercholesterolemia 03/12/2020   PUD (peptic ulcer disease) 05/09/2019   OAB (overactive bladder) 05/09/2019   Lumbar back pain 05/09/2019   DDD (degenerative disc disease), lumbar 05/09/2019   Status post total knee replacement, 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 02/11/2018   Iron deficiency anemia 04/28/2017   Vitamin D deficiency 04/28/2017   Osteopenia of lumbar spine 04/28/2017   Gastritis, erosive  Abdominal pain, epigastric 12/12/2016   Colon cancer screening 11/03/2016   Essential hypertension 11/03/2016   Primary osteoarthritis of both knees 11/03/2016   GERD (gastroesophageal reflux disease) 11/03/2016   Age-related osteoporosis without current pathological fracture 11/03/2016   Perforation of sigmoid colon ([place]) 06/29/2016   REFERRING PROVIDER: Arther Abbott MD  REFERRING DIAG: S/p right total knee replacement.  THERAPY DIAG:  Chronic pain of right knee  Muscle weakness (generalized)  Stiffness of  right knee, not elsewhere classified  Localized edema  Rationale for Evaluation and Treatment: Rehabilitation  ONSET DATE: 10/24/12 (date of surgery).  SUBJECTIVE:   SUBJECTIVE STATEMENT: Drove for the first time since surgery today. Got a good report from her surgeon yesterday. Greatest issue is keeping swelling under control.   PERTINENT HISTORY: Left total knee and total hip replacement.  PAIN:  Are you having pain? No  PRECAUTIONS: Other: No ultrasound.  PATIENT GOALS: Not have right knee pain and do more.  OBJECTIVE:  PATIENT SURVEYS:  FOTO 58.    EDEMA:  Circumferential: 6 cms right > left.  PALPATION: Mild diffuse right anterior knee pain.  LOWER EXTREMITY ROM:    Active  Right 11/25/22  Hip flexion   Hip extension   Hip abduction   Hip adduction   Hip internal rotation   Hip external rotation   Knee flexion 109  Knee extension 0  Ankle dorsiflexion   Ankle plantarflexion   Ankle inversion   Ankle eversion    (Blank rows = not tested)  LOWER EXTREMITY MMT: Right hip flexion is 4-/5 and right knee extension is 4/5.  GAIT: WNL with no AD today in clinic (11/25/22)  TODAY'S TREATMENT:                                                                                                                              DATE:                      3/26    EXERCISE LOG  Exercise Repetitions and Resistance Comments  Nustep L3, seat 9 x16 min   Slant board stretch X2 min   LAQ 4# x20 reps   HS curl  Red x20 reps   Clam  Red x30 reps    Blank cell = exercise not performed today  Modalities  Date: 11/25/22 Vaso: Knee, Low, 15 mins, Edema                                   3/20 EXERCISE LOG  Exercise Repetitions and Resistance Comments  Nustep L3 x 16 minutes; seat 8-6   LAQ 2# x 2 minutes    Seated HS curl  Green t-band x 30 reps    Standing hip ABD 20 reps each    Rocker board  4 minutes    Lunge onto step  14" x 2  minutes    Standing HS stretch 3 x 30  seconds   Gastroc stretch  3 x 30 seconds    Blank cell = exercise not performed today  Modalities  Date:  Vaso: Knee, 34 degrees; low pressure, 15 mins, Pain  ASSESSMENT:  CLINICAL IMPRESSION: Patient presented in clinic with reports of no R knee pain but continues to have moderate knee edema. Patient able to tolerate all therex strengthening and stretching with only complaint of fatigue with LAQ. Patient able to achieve full knee extension and improving to goal status knee flexion. Patient no longer using AD except for certain surfaces such as her driveway. Normal vasopneumatic response noted following removal of the modality.  OBJECTIVE IMPAIRMENTS: Abnormal gait, decreased activity tolerance, decreased ROM, decreased strength, increased edema, and pain.   ACTIVITY LIMITATIONS: locomotion level  PARTICIPATION LIMITATIONS: cleaning and laundry  REHAB POTENTIAL: Excellent  CLINICAL DECISION MAKING: Stable/uncomplicated  EVALUATION COMPLEXITY: Low  GOALS:  SHORT TERM GOALS: Target date: 11/24/22 Ind with an initial HEP. Goal status: On-going  2.  Full active right knee extension.  Goal status: MET  LONG TERM GOALS: Target date: 02/08/23.  Ind with an advanced HEP.  Goal status: On-going  2.  Active knee flexion to 120-125 degrees+ so the patient can perform functional tasks and do so with pain not > 2-3/10.  Goal status: On-going  3.  Increase right hip and knee strength to a solid 5/5 to provide good stability for accomplishment of functional activities.  Goal status: On-going  4.  Decrease edema to within 2.5 cms of non-affected side to assist with pain reduction and range of motion gains.  Goal status: On-going  5.  Perform a reciprocating stair gait with one railing with pain not > 2-3/10.  Goal status: On-going  PLAN:  PT FREQUENCY: 2x/week  PT DURATION: 6 weeks  PLANNED INTERVENTIONS: Therapeutic exercises, Therapeutic activity, Neuromuscular re-education,  Gait training, Patient/Family education, Self Care, Electrical stimulation, Cryotherapy, Moist heat, Vasopneumatic device, and Manual therapy  PLAN FOR NEXT SESSION: Nustep with progression to recumbent bike.  Progress into TKA protocol.  Vasopneumatic.  Standley Brooking, PTA 11/25/22 10:29 AM

## 2022-11-27 ENCOUNTER — Encounter: Payer: Self-pay | Admitting: Physical Therapy

## 2022-11-27 ENCOUNTER — Ambulatory Visit: Payer: Medicare HMO | Admitting: Physical Therapy

## 2022-11-27 DIAGNOSIS — M25561 Pain in right knee: Secondary | ICD-10-CM | POA: Diagnosis not present

## 2022-11-27 DIAGNOSIS — Z96651 Presence of right artificial knee joint: Secondary | ICD-10-CM | POA: Diagnosis not present

## 2022-11-27 DIAGNOSIS — M6281 Muscle weakness (generalized): Secondary | ICD-10-CM | POA: Diagnosis not present

## 2022-11-27 DIAGNOSIS — G8929 Other chronic pain: Secondary | ICD-10-CM

## 2022-11-27 DIAGNOSIS — M25661 Stiffness of right knee, not elsewhere classified: Secondary | ICD-10-CM

## 2022-11-27 DIAGNOSIS — R600 Localized edema: Secondary | ICD-10-CM

## 2022-11-27 DIAGNOSIS — M1711 Unilateral primary osteoarthritis, right knee: Secondary | ICD-10-CM | POA: Diagnosis not present

## 2022-11-27 NOTE — Therapy (Signed)
OUTPATIENT PHYSICAL THERAPY LOWER EXTREMITY TREATMENT   Patient Name: Tiffany Bradley MRN: [id number] DOB:1946-07-31, 77 y.o., female Today's Date: 11/27/2022  END OF SESSION:  PT End of Session - 11/27/22 0943     Visit Number 6    Number of Visits 12    Date for PT Re-Evaluation 02/08/23    Authorization Type FOTO AT LEAST EVERY 5TH VISIT.  PROGRESS NOTE AT 10TH VISIT.  KX MODIFIER AFTER 15 VISITS.    PT Start Time 0945    PT Stop Time 1030    PT Time Calculation (min) 45 min    Activity Tolerance Patient tolerated treatment well    Behavior During Therapy WFL for tasks assessed/performed            Past Medical History:  Diagnosis Date   Arthritis    Back pain    Cataract    DJD (degenerative joint disease)    Full dentures    GERD (gastroesophageal reflux disease)    Glaucoma    History of bleeding ulcers    patient reports multiple hospitalizations remotely   History of total left hip replacement    Hypertension    Pre-diabetes    Wears glasses    Past Surgical History:  Procedure Laterality Date   BREAST REDUCTION SURGERY Bilateral 04/25/2013   Procedure: MAMMARY REDUCTION  (BREAST);  Surgeon: Cristine Polio, MD;  Location: [place];  Service: Plastics;  Laterality: Bilateral;   COLONOSCOPY     2008, normal per patient   COLONOSCOPY N/A 01/16/2017   Procedure: COLONOSCOPY;  Surgeon: Danie Binder, MD;  [facility];  Service: Endoscopy;  Laterality: N/A;  830    COSMETIC SURGERY     ESOPHAGOGASTRODUODENOSCOPY N/A 01/16/2017   Procedure: ESOPHAGOGASTRODUODENOSCOPY (EGD);  Surgeon: Danie Binder, MD;  [facility];  Service: Endoscopy;  Laterality: N/A;   PARTIAL COLECTOMY N/A 06/29/2016   Procedure: PARTIAL COLECTOMY;  Surgeon: Vickie Epley, MD;  [facility];  Service: General;  Laterality: N/A;   REDUCTION MAMMAPLASTY     TOTAL HIP ARTHROPLASTY Left 02/28/2022   Procedure: LEFT TOTAL HIP ARTHROPLASTY  ANTERIOR APPROACH;  Surgeon: Mcarthur Rossetti, MD;  [facility];  Service: Orthopedics;  Laterality: Left;   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   Procedure: TOTAL KNEE ARTHROPLASTY;  Surgeon: Carole Civil, MD;  [facility];  Service: Orthopedics;  Laterality: Left;  DePuy    TOTAL KNEE ARTHROPLASTY Right 10/14/2022   Procedure: TOTAL KNEE ARTHROPLASTY;  Surgeon: Carole Civil, MD;  [facility];  Service: Orthopedics;  Laterality: Right;   TUBAL LIGATION     Patient Active Problem List   Diagnosis Date Noted   S/P TKR (total knee replacement), 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 11/21/2022   Unilateral primary osteoarthritis, right knee 10/14/2022   Primary osteoarthritis of right knee 10/14/2022   Status post left hip replacement 02/28/2022   Small bowel obstruction ([place]) 12/13/2021   Hyperglycemia 12/13/2021   Unilateral primary osteoarthritis, left hip 12/12/2021   Viral upper respiratory tract infection 03/05/2021   History of retinal detachment 03/12/2020   Pure hypercholesterolemia 03/12/2020   PUD (peptic ulcer disease) 05/09/2019   OAB (overactive bladder) 05/09/2019   Lumbar back pain 05/09/2019   DDD (degenerative disc disease), lumbar 05/09/2019   Status post total knee replacement, 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 02/11/2018   Iron deficiency anemia 04/28/2017   Vitamin D deficiency 04/28/2017   Osteopenia of lumbar spine 04/28/2017   Gastritis, erosive  Abdominal pain, epigastric 12/12/2016   Colon cancer screening 11/03/2016   Essential hypertension 11/03/2016   Primary osteoarthritis of both knees 11/03/2016   GERD (gastroesophageal reflux disease) 11/03/2016   Age-related osteoporosis without current pathological fracture 11/03/2016   Perforation of sigmoid colon ([place]) 06/29/2016   REFERRING PROVIDER: Arther Abbott MD  REFERRING DIAG: S/p right total knee replacement.  THERAPY DIAG:  Chronic pain of right knee  Muscle weakness (generalized)  Localized  edema  Stiffness of right knee, not elsewhere classified  Rationale for Evaluation and Treatment: Rehabilitation  ONSET DATE: 10/24/12 (date of surgery).  SUBJECTIVE:   SUBJECTIVE STATEMENT: Reports not as swollen today and no pain.   PERTINENT HISTORY: Left total knee and total hip replacement.  PAIN: Are you having pain? No  PRECAUTIONS: Other: No ultrasound.  PATIENT GOALS: Not have right knee pain and do more.  NEXT MD VISIT: 01/05/23  OBJECTIVE:  PATIENT SURVEYS:  FOTO 53.    EDEMA:  Circumferential: 6 cms right > left.  PALPATION: Mild diffuse right anterior knee pain.  LOWER EXTREMITY ROM:    Active  Right 11/25/22  Hip flexion   Hip extension   Hip abduction   Hip adduction   Hip internal rotation   Hip external rotation   Knee flexion 109  Knee extension 0  Ankle dorsiflexion   Ankle plantarflexion   Ankle inversion   Ankle eversion    (Blank rows = not tested)   LOWER EXTREMITY MMT: Right hip flexion is 4-/5 and right knee extension is 4/5.  GAIT: WNL with no AD today in clinic (11/25/22)  TODAY'S TREATMENT:                                                                                                                              DATE:                      3/28    EXERCISE LOG  Exercise Repetitions and Resistance Comments  Nustep L4, seat 7x16 min   Knee flexors 30# x20 reps   Knee extensors 10# x20 reps   Lunges 8" step x20 reps 3 sec holds   Stair training X2 RT; attempting reciprical gait with one handrail    Blank cell = exercise not performed today   Modalities  Date: 11/27/22 Vaso: Knee, Low, 15 mins, Edema  ASSESSMENT:  CLINICAL IMPRESSION: Patient presented in clinic with reports of no pain and reduced swelling of R knee. Patient able to tolerate progression of machine strengthening well with only fatigue reported. Patient is still hesitant with reciprocal gait as she is habitual with nonreciprocal stair gait. Patient able to  ascend the stairs reciprocally but descending was more awkward per patient report. Normal vasopnuematic response noted following removal of the modality.  OBJECTIVE IMPAIRMENTS: Abnormal gait, decreased activity tolerance, decreased ROM, decreased strength, increased edema, and pain.   ACTIVITY LIMITATIONS: locomotion level  PARTICIPATION LIMITATIONS: cleaning  and laundry  REHAB POTENTIAL: Excellent  CLINICAL DECISION MAKING: Stable/uncomplicated  EVALUATION COMPLEXITY: Low  GOALS:  SHORT TERM GOALS: Target date: 11/24/22 Ind with an initial HEP. Goal status: On-going  2.  Full active right knee extension.  Goal status: MET  LONG TERM GOALS: Target date: 02/08/23.  Ind with an advanced HEP.  Goal status: On-going  2.  Active knee flexion to 120-125 degrees+ so the patient can perform functional tasks and do so with pain not > 2-3/10.  Goal status: On-going  3.  Increase right hip and knee strength to a solid 5/5 to provide good stability for accomplishment of functional activities.  Goal status: On-going  4.  Decrease edema to within 2.5 cms of non-affected side to assist with pain reduction and range of motion gains.  Goal status: On-going  5.  Perform a reciprocating stair gait with one railing with pain not > 2-3/10.  Goal status: On-going  PLAN:  PT FREQUENCY: 2x/week  PT DURATION: 6 weeks  PLANNED INTERVENTIONS: Therapeutic exercises, Therapeutic activity, Neuromuscular re-education, Gait training, Patient/Family education, Self Care, Electrical stimulation, Cryotherapy, Moist heat, Vasopneumatic device, and Manual therapy  PLAN FOR NEXT SESSION: Nustep with progression to recumbent bike.  Progress into TKA protocol.  Vasopneumatic.  Standley Brooking, PTA 11/27/22 11:28 AM

## 2022-12-01 ENCOUNTER — Ambulatory Visit: Payer: Medicare HMO | Attending: Orthopedic Surgery | Admitting: Physical Therapy

## 2022-12-01 ENCOUNTER — Encounter: Payer: Self-pay | Admitting: Physical Therapy

## 2022-12-01 DIAGNOSIS — G8929 Other chronic pain: Secondary | ICD-10-CM | POA: Insufficient documentation

## 2022-12-01 DIAGNOSIS — M6281 Muscle weakness (generalized): Secondary | ICD-10-CM | POA: Diagnosis not present

## 2022-12-01 DIAGNOSIS — R600 Localized edema: Secondary | ICD-10-CM | POA: Insufficient documentation

## 2022-12-01 DIAGNOSIS — M25561 Pain in right knee: Secondary | ICD-10-CM | POA: Diagnosis not present

## 2022-12-01 DIAGNOSIS — M25661 Stiffness of right knee, not elsewhere classified: Secondary | ICD-10-CM | POA: Insufficient documentation

## 2022-12-01 NOTE — Therapy (Addendum)
OUTPATIENT PHYSICAL THERAPY LOWER EXTREMITY TREATMENT   Patient Name: Tiffany Bradley MRN: [id number] DOB:02-24-1946, 77 y.o., female Today's Date: 12/01/2022  END OF SESSION:  PT End of Session - 12/01/22 1042     Visit Number 7    Number of Visits 12    Date for PT Re-Evaluation 02/08/23    Authorization Type FOTO AT LEAST EVERY 5TH VISIT.  PROGRESS NOTE AT 10TH VISIT.  KX MODIFIER AFTER 15 VISITS.    PT Start Time 1032    PT Stop Time 1120    PT Time Calculation (min) 48 min    Activity Tolerance Patient tolerated treatment well    Behavior During Therapy WFL for tasks assessed/performed            Past Medical History:  Diagnosis Date   Arthritis    Back pain    Cataract    DJD (degenerative joint disease)    Full dentures    GERD (gastroesophageal reflux disease)    Glaucoma    History of bleeding ulcers    patient reports multiple hospitalizations remotely   History of total left hip replacement    Hypertension    Pre-diabetes    Wears glasses    Past Surgical History:  Procedure Laterality Date   BREAST REDUCTION SURGERY Bilateral 04/25/2013   Procedure: MAMMARY REDUCTION  (BREAST);  Surgeon: Cristine Polio, MD;  Location: [place];  Service: Plastics;  Laterality: Bilateral;   COLONOSCOPY     2008, normal per patient   COLONOSCOPY N/A 01/16/2017   Procedure: COLONOSCOPY;  Surgeon: Danie Binder, MD;  [facility];  Service: Endoscopy;  Laterality: N/A;  830    COSMETIC SURGERY     ESOPHAGOGASTRODUODENOSCOPY N/A 01/16/2017   Procedure: ESOPHAGOGASTRODUODENOSCOPY (EGD);  Surgeon: Danie Binder, MD;  [facility];  Service: Endoscopy;  Laterality: N/A;   PARTIAL COLECTOMY N/A 06/29/2016   Procedure: PARTIAL COLECTOMY;  Surgeon: Vickie Epley, MD;  [facility];  Service: General;  Laterality: N/A;   REDUCTION MAMMAPLASTY     TOTAL HIP ARTHROPLASTY Left 02/28/2022   Procedure: LEFT TOTAL HIP ARTHROPLASTY  ANTERIOR APPROACH;  Surgeon: Mcarthur Rossetti, MD;  [facility];  Service: Orthopedics;  Laterality: Left;   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   Procedure: TOTAL KNEE ARTHROPLASTY;  Surgeon: Carole Civil, MD;  [facility];  Service: Orthopedics;  Laterality: Left;  DePuy    TOTAL KNEE ARTHROPLASTY Right 10/14/2022   Procedure: TOTAL KNEE ARTHROPLASTY;  Surgeon: Carole Civil, MD;  [facility];  Service: Orthopedics;  Laterality: Right;   TUBAL LIGATION     Patient Active Problem List   Diagnosis Date Noted   S/P TKR (total knee replacement), 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 11/21/2022   Unilateral primary osteoarthritis, right knee 10/14/2022   Primary osteoarthritis of right knee 10/14/2022   Status post left hip replacement 02/28/2022   Small bowel obstruction 12/13/2021   Hyperglycemia 12/13/2021   Unilateral primary osteoarthritis, left hip 12/12/2021   Viral upper respiratory tract infection 03/05/2021   History of retinal detachment 03/12/2020   Pure hypercholesterolemia 03/12/2020   PUD (peptic ulcer disease) 05/09/2019   OAB (overactive bladder) 05/09/2019   Lumbar back pain 05/09/2019   DDD (degenerative disc disease), lumbar 05/09/2019   Status post total knee replacement, 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 02/11/2018   Iron deficiency anemia 04/28/2017   Vitamin D deficiency 04/28/2017   Osteopenia of lumbar spine 04/28/2017   Gastritis, erosive  Abdominal pain, epigastric 12/12/2016   Colon cancer screening 11/03/2016   Essential hypertension 11/03/2016   Primary osteoarthritis of both knees 11/03/2016   GERD (gastroesophageal reflux disease) 11/03/2016   Age-related osteoporosis without current pathological fracture 11/03/2016   Perforation of sigmoid colon 06/29/2016   REFERRING PROVIDER: Arther Abbott MD  REFERRING DIAG: S/p right total knee replacement.  THERAPY DIAG:  Chronic pain of right knee  Muscle weakness (generalized)  Localized  edema  Stiffness of right knee, not elsewhere classified  Rationale for Evaluation and Treatment: Rehabilitation  ONSET DATE: 10/24/12 (date of surgery).  SUBJECTIVE:   SUBJECTIVE STATEMENT: Came in today with no AD.  PERTINENT HISTORY: Left total knee and total hip replacement.  PAIN: Are you having pain? No  PRECAUTIONS: Other: No ultrasound.  PATIENT GOALS: Not have right knee pain and do more.  NEXT MD VISIT: 01/05/23  OBJECTIVE:  PATIENT SURVEYS:  FOTO 53.    EDEMA:  Circumferential: R knee: 42.7 cm, L knee: 40.7 cm  PALPATION: Tender over mid- inferior incision.  LOWER EXTREMITY ROM:    Active  Right 11/25/22 Right 12/01/22  Hip flexion    Hip extension    Hip abduction    Hip adduction    Hip internal rotation    Hip external rotation    Knee flexion 109 116  Knee extension 0 0  Ankle dorsiflexion    Ankle plantarflexion    Ankle inversion    Ankle eversion     (Blank rows = not tested)   LOWER EXTREMITY MMT: Right hip flexion is 4-/5 and right knee extension is 4/5.  GAIT: WNL with no AD today in clinic (11/25/22)  TODAY'S TREATMENT:                                                                                                                              DATE:                     4/1    EXERCISE LOG  Exercise Repetitions and Resistance Comments  Stationary bike L1, seat 8 x20 min   Knee flexors 30# x20 reps   Knee extensors 10# x20 reps   Lunges 8" step x20 reps 3 sec holds    Blank cell = exercise not performed today   Modalities  Date: 12/01/22 Vaso: Knee, Med, 10 mins, Edema  ASSESSMENT:  CLINICAL IMPRESSION: Patient presented in clinic without AD. Patient progressed to stationary bike which she reported was uncomfortable and seat adjusted accordingly. Patient fatigued by end of stationary bike session. Patient's edema goal achieved today as edema measurements are within 2 cm of each other. Patient's R knee ROM also improved greatly  following stationary bike. Mild keloiding of the incision notable with patient reporting tenderness of the mid to inferior incision. Normal vasopneumatic response noted following removal of the modality.  OBJECTIVE IMPAIRMENTS: Abnormal gait, decreased activity tolerance, decreased ROM, decreased strength, increased edema,  and pain.   ACTIVITY LIMITATIONS: locomotion level  PARTICIPATION LIMITATIONS: cleaning and laundry  REHAB POTENTIAL: Excellent  CLINICAL DECISION MAKING: Stable/uncomplicated  EVALUATION COMPLEXITY: Low  GOALS:  SHORT TERM GOALS: Target date: 11/24/22 Ind with an initial HEP. Goal status: On-going  2.  Full active right knee extension.  Goal status: MET  LONG TERM GOALS: Target date: 02/08/23.  Ind with an advanced HEP.  Goal status: On-going  2.  Active knee flexion to 120-125 degrees+ so the patient can perform functional tasks and do so with pain not > 2-3/10.  Goal status: On-going  3.  Increase right hip and knee strength to a solid 5/5 to provide good stability for accomplishment of functional activities.  Goal status: On-going  4.  Decrease edema to within 2.5 cms of non-affected side to assist with pain reduction and range of motion gains.  Goal status: MET  5.  Perform a reciprocating stair gait with one railing with pain not > 2-3/10.  Goal status: On-going  PLAN:  PT FREQUENCY: 2x/week  PT DURATION: 6 weeks  PLANNED INTERVENTIONS: Therapeutic exercises, Therapeutic activity, Neuromuscular re-education, Gait training, Patient/Family education, Self Care, Electrical stimulation, Cryotherapy, Moist heat, Vasopneumatic device, and Manual therapy  PLAN FOR NEXT SESSION: Nustep with progression to recumbent bike.  Progress into TKA protocol.  Vasopneumatic.  Standley Brooking, PTA 12/01/22 12:19 PM

## 2022-12-03 ENCOUNTER — Ambulatory Visit: Payer: Medicare HMO | Admitting: Physical Therapy

## 2022-12-03 ENCOUNTER — Encounter: Payer: Self-pay | Admitting: Physical Therapy

## 2022-12-03 DIAGNOSIS — G8929 Other chronic pain: Secondary | ICD-10-CM

## 2022-12-03 DIAGNOSIS — M25561 Pain in right knee: Secondary | ICD-10-CM | POA: Diagnosis not present

## 2022-12-03 DIAGNOSIS — R600 Localized edema: Secondary | ICD-10-CM | POA: Diagnosis not present

## 2022-12-03 DIAGNOSIS — M25661 Stiffness of right knee, not elsewhere classified: Secondary | ICD-10-CM

## 2022-12-03 DIAGNOSIS — M6281 Muscle weakness (generalized): Secondary | ICD-10-CM

## 2022-12-03 NOTE — Therapy (Addendum)
OUTPATIENT PHYSICAL THERAPY LOWER EXTREMITY TREATMENT   Patient Name: Tiffany Bradley MRN: [id number] DOB:[birth year], 77 y.o., female Today's Date: 12/03/2022  END OF SESSION:  PT End of Session - 12/03/22 1033     Visit Number 8    Number of Visits 12    Date for PT Re-Evaluation 02/08/23    Authorization Type FOTO AT LEAST EVERY 5TH VISIT.  PROGRESS NOTE AT 10TH VISIT.  KX MODIFIER AFTER 15 VISITS.    PT Start Time 1032    PT Stop Time 1113    PT Time Calculation (min) 41 min    Activity Tolerance Patient tolerated treatment well    Behavior During Therapy WFL for tasks assessed/performed            Past Medical History:  Diagnosis Date   Arthritis    Back pain    Cataract    DJD (degenerative joint disease)    Full dentures    GERD (gastroesophageal reflux disease)    Glaucoma    History of bleeding ulcers    patient reports multiple hospitalizations remotely   History of total left hip replacement    Hypertension    Pre-diabetes    Wears glasses    Past Surgical History:  Procedure Laterality Date   BREAST REDUCTION SURGERY Bilateral 04/25/2013   Procedure: MAMMARY REDUCTION  (BREAST);  Surgeon: Cristine Polio, MD;  Location: [facility];  Service: Plastics;  Laterality: Bilateral;   COLONOSCOPY     2008, normal per patient   COLONOSCOPY N/A 01/16/2017   Procedure: COLONOSCOPY;  Surgeon: Danie Binder, MD;  [facility];  Service: Endoscopy;  Laterality: N/A;  830    COSMETIC SURGERY     ESOPHAGOGASTRODUODENOSCOPY N/A 01/16/2017   Procedure: ESOPHAGOGASTRODUODENOSCOPY (EGD);  Surgeon: Danie Binder, MD;  [facility];  Service: Endoscopy;  Laterality: N/A;   PARTIAL COLECTOMY N/A 06/29/2016   Procedure: PARTIAL COLECTOMY;  Surgeon: Vickie Epley, MD;  Location: [facility];  Service: General;  Laterality: N/A;   REDUCTION MAMMAPLASTY     TOTAL HIP ARTHROPLASTY Left 02/28/2022   Procedure: LEFT TOTAL HIP ARTHROPLASTY  ANTERIOR APPROACH;  Surgeon: Mcarthur Rossetti, MD;  Location: [facility];  Service: Orthopedics;  Laterality: Left;   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   Procedure: TOTAL KNEE ARTHROPLASTY;  Surgeon: Carole Civil, MD;  Location: [facility];  Service: Orthopedics;  Laterality: Left;  DePuy    TOTAL KNEE ARTHROPLASTY Right 10/14/2022   Procedure: TOTAL KNEE ARTHROPLASTY;  Surgeon: Carole Civil, MD;  Location: [facility];  Service: Orthopedics;  Laterality: Right;   TUBAL LIGATION     Patient Active Problem List   Diagnosis Date Noted   S/P TKR (total knee replacement), 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 11/21/2022   Unilateral primary osteoarthritis, right knee 10/14/2022   Primary osteoarthritis of right knee 10/14/2022   Status post left hip replacement 02/28/2022   Small bowel obstruction 12/13/2021   Hyperglycemia 12/13/2021   Unilateral primary osteoarthritis, left hip 12/12/2021   Viral upper respiratory tract infection 03/05/2021   History of retinal detachment 03/12/2020   Pure hypercholesterolemia 03/12/2020   PUD (peptic ulcer disease) 05/09/2019   OAB (overactive bladder) 05/09/2019   Lumbar back pain 05/09/2019   DDD (degenerative disc disease), lumbar 05/09/2019   Status post total knee replacement, 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 02/11/2018   Iron deficiency anemia 04/28/2017   Vitamin D deficiency 04/28/2017   Osteopenia of lumbar spine 04/28/2017   Gastritis, erosive  Abdominal pain, epigastric 12/12/2016   Colon cancer screening 11/03/2016   Essential hypertension 11/03/2016   Primary osteoarthritis of both knees 11/03/2016   GERD (gastroesophageal reflux disease) 11/03/2016   Age-related osteoporosis without current pathological fracture 11/03/2016   Perforation of sigmoid colon 06/29/2016   REFERRING PROVIDER: Arther Abbott MD  REFERRING DIAG: S/p right total knee replacement.  THERAPY DIAG:  Chronic pain of right knee  Muscle weakness (generalized)  Localized  edema  Stiffness of right knee, not elsewhere classified  Rationale for Evaluation and Treatment: Rehabilitation  ONSET DATE: 10/24/12 (date of surgery).  SUBJECTIVE:   SUBJECTIVE STATEMENT: Was very sore after being on stationary bike in last treatment. Reports stiffness this morning.  PERTINENT HISTORY: Left total knee and total hip replacement.  PAIN: Are you having pain? No  PRECAUTIONS: Other: No ultrasound.  PATIENT GOALS: Not have right knee pain and do more.  NEXT MD VISIT: 01/05/23  OBJECTIVE:  PATIENT SURVEYS:  FOTO 53.    EDEMA:  Circumferential: R knee: 42.7 cm, L knee: 40.7 cm  PALPATION: Tender over mid- inferior incision.  LOWER EXTREMITY ROM:    Active  Right 11/25/22 Right 12/01/22  Hip flexion    Hip extension    Hip abduction    Hip adduction    Hip internal rotation    Hip external rotation    Knee flexion 109 116  Knee extension 0 0  Ankle dorsiflexion    Ankle plantarflexion    Ankle inversion    Ankle eversion     (Blank rows = not tested)   LOWER EXTREMITY MMT: Right hip flexion is 4-/5 and right knee extension is 4/5.  GAIT: WNL with no AD today in clinic (11/25/22)  TODAY'S TREATMENT:                                                                                                                              DATE:                     4/3    EXERCISE LOG  Exercise Repetitions and Resistance Comments  Nustep L4, seat 8 x15 min   Knee flexors 30# x20 reps   Knee extensors 10# x20 reps   Lunges 8" step x20 reps 5 sec holds   Forward step up 6" step RLE x20 reps   Lateral step up 6" step RLE x20 reps    Blank cell = exercise not performed today   Modalities  Date: 12/03/22 Vaso: Knee, Med, 10 mins, Edema  ASSESSMENT:  CLINICAL IMPRESSION: Patient arrived in clinic with only indications of knee stiffness. Soreness greater after trying stationary bike in last PT session. Patient progressed to functional activities such as step ups  with no compensatory strategies or pain indicated. Mild to mod edema notable in R knee today. Normal vasopneumatic response noted following removal of the modality.  OBJECTIVE IMPAIRMENTS: Abnormal gait, decreased activity tolerance, decreased ROM,  decreased strength, increased edema, and pain.   ACTIVITY LIMITATIONS: locomotion level  PARTICIPATION LIMITATIONS: cleaning and laundry  REHAB POTENTIAL: Excellent  CLINICAL DECISION MAKING: Stable/uncomplicated  EVALUATION COMPLEXITY: Low  GOALS:  SHORT TERM GOALS: Target date: 11/24/22 Ind with an initial HEP. Goal status: On-going  2.  Full active right knee extension.  Goal status: MET  LONG TERM GOALS: Target date: 02/08/23.  Ind with an advanced HEP.  Goal status: On-going  2.  Active knee flexion to 120-125 degrees+ so the patient can perform functional tasks and do so with pain not > 2-3/10.  Goal status: On-going  3.  Increase right hip and knee strength to a solid 5/5 to provide good stability for accomplishment of functional activities.  Goal status: On-going  4.  Decrease edema to within 2.5 cms of non-affected side to assist with pain reduction and range of motion gains.  Goal status: MET  5.  Perform a reciprocating stair gait with one railing with pain not > 2-3/10.  Goal status: On-going  PLAN:  PT FREQUENCY: 2x/week  PT DURATION: 6 weeks  PLANNED INTERVENTIONS: Therapeutic exercises, Therapeutic activity, Neuromuscular re-education, Gait training, Patient/Family education, Self Care, Electrical stimulation, Cryotherapy, Moist heat, Vasopneumatic device, and Manual therapy  PLAN FOR NEXT SESSION: Nustep with progression to recumbent bike.  Progress into TKA protocol.  Vasopneumatic.  Standley Brooking, PTA 12/03/22 11:16 AM

## 2022-12-06 ENCOUNTER — Other Ambulatory Visit: Payer: Self-pay | Admitting: Orthopedic Surgery

## 2022-12-06 DIAGNOSIS — Z96651 Presence of right artificial knee joint: Secondary | ICD-10-CM

## 2022-12-08 ENCOUNTER — Ambulatory Visit: Payer: Medicare HMO | Admitting: Physical Therapy

## 2022-12-08 ENCOUNTER — Encounter: Payer: Self-pay | Admitting: Physical Therapy

## 2022-12-08 DIAGNOSIS — M25561 Pain in right knee: Secondary | ICD-10-CM | POA: Diagnosis not present

## 2022-12-08 DIAGNOSIS — M6281 Muscle weakness (generalized): Secondary | ICD-10-CM

## 2022-12-08 DIAGNOSIS — M25661 Stiffness of right knee, not elsewhere classified: Secondary | ICD-10-CM

## 2022-12-08 DIAGNOSIS — R600 Localized edema: Secondary | ICD-10-CM

## 2022-12-08 DIAGNOSIS — G8929 Other chronic pain: Secondary | ICD-10-CM | POA: Diagnosis not present

## 2022-12-08 NOTE — Therapy (Signed)
OUTPATIENT PHYSICAL THERAPY LOWER EXTREMITY TREATMENT   Patient Name: Tiffany Bradley MRN: [id number] DOB:09/07/1945, 77 y.o., female Today's Date: 12/08/2022  END OF SESSION:  PT End of Session - 12/08/22 1030     Visit Number 9    Number of Visits 12    Date for PT Re-Evaluation 02/08/23    Authorization Type FOTO AT LEAST EVERY 5TH VISIT.  PROGRESS NOTE AT 10TH VISIT.  KX MODIFIER AFTER 15 VISITS.    PT Start Time 1030    PT Stop Time 1115    PT Time Calculation (min) 45 min    Activity Tolerance Patient tolerated treatment well    Behavior During Therapy WFL for tasks assessed/performed            Past Medical History:  Diagnosis Date   Arthritis    Back pain    Cataract    DJD (degenerative joint disease)    Full dentures    GERD (gastroesophageal reflux disease)    Glaucoma    History of bleeding ulcers    patient reports multiple hospitalizations remotely   History of total left hip replacement    Hypertension    Pre-diabetes    Wears glasses    Past Surgical History:  Procedure Laterality Date   BREAST REDUCTION SURGERY Bilateral 04/25/2013   Procedure: MAMMARY REDUCTION  (BREAST);  Surgeon: Louisa SecondGerald Truesdale, MD;  Location: [facility];  Service: Plastics;  Laterality: Bilateral;   COLONOSCOPY     2008, normal per patient   COLONOSCOPY N/A 01/16/2017   Procedure: COLONOSCOPY;  Surgeon: West BaliFields, Sandi L, MD;  [facility];  Service: Endoscopy;  Laterality: N/A;  830    COSMETIC SURGERY     ESOPHAGOGASTRODUODENOSCOPY N/A 01/16/2017   Procedure: ESOPHAGOGASTRODUODENOSCOPY (EGD);  Surgeon: West BaliFields, Sandi L, MD;  [facility];  Service: Endoscopy;  Laterality: N/A;   PARTIAL COLECTOMY N/A 06/29/2016   Procedure: PARTIAL COLECTOMY;  Surgeon: Ancil LinseyJason Evan Davis, MD;  Location[facility];  Service: General;  Laterality: N/A;   REDUCTION MAMMAPLASTY     TOTAL HIP ARTHROPLASTY Left 02/28/2022   Procedure: LEFT TOTAL HIP ARTHROPLASTY  ANTERIOR APPROACH;  Surgeon: Kathryne HitchBlackman, Christopher Y, MD;  Location: [facility];  Service: Orthopedics;  Laterality: Left;   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   Procedure: TOTAL KNEE ARTHROPLASTY;  Surgeon: Vickki HearingHarrison, Stanley E, MD;  Location: [facility];  Service: Orthopedics;  Laterality: Left;  DePuy    TOTAL KNEE ARTHROPLASTY Right 10/14/2022   Procedure: TOTAL KNEE ARTHROPLASTY;  Surgeon: Vickki HearingHarrison, Stanley E, MD;  Location: [facility];  Service: Orthopedics;  Laterality: Right;   TUBAL LIGATION     Patient Active Problem List   Diagnosis Date Noted   S/P TKR (total knee replacement), 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 11/21/2022   Unilateral primary osteoarthritis, right knee 10/14/2022   Primary osteoarthritis of right knee 10/14/2022   Status post left hip replacement 02/28/2022   Small bowel obstruction 12/13/2021   Hyperglycemia 12/13/2021   Unilateral primary osteoarthritis, left hip 12/12/2021   Viral upper respiratory tract infection 03/05/2021   History of retinal detachment 03/12/2020   Pure hypercholesterolemia 03/12/2020   PUD (peptic ulcer disease) 05/09/2019   OAB (overactive bladder) 05/09/2019   Lumbar back pain 05/09/2019   DDD (degenerative disc disease), lumbar 05/09/2019   Status post total knee replacement, 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 02/11/2018   Iron deficiency anemia 04/28/2017   Vitamin D deficiency 04/28/2017   Osteopenia of lumbar spine 04/28/2017   Gastritis, erosive  Abdominal pain, epigastric 12/12/2016   Colon cancer screening 11/03/2016   Essential hypertension 11/03/2016   Primary osteoarthritis of both knees 11/03/2016   GERD (gastroesophageal reflux disease) 11/03/2016   Age-related osteoporosis without current pathological fracture 11/03/2016   Perforation of sigmoid colon 06/29/2016   REFERRING PROVIDER: Fuller Canada MD  REFERRING DIAG: S/p right total knee replacement.  THERAPY DIAG:  Chronic pain of right knee  Muscle weakness (generalized)  Localized  edema  Stiffness of right knee, not elsewhere classified  Rationale for Evaluation and Treatment: Rehabilitation  ONSET DATE: 10/24/12 (date of surgery).  SUBJECTIVE:   SUBJECTIVE STATEMENT: Reports only stiffness.  PERTINENT HISTORY: Left total knee and total hip replacement.  PAIN: Are you having pain? No  PRECAUTIONS: Other: No ultrasound.  PATIENT GOALS: Not have right knee pain and do more.  NEXT MD VISIT: 01/05/23  OBJECTIVE:  PATIENT SURVEYS:  FOTO 53.    EDEMA:  Circumferential: R knee: 42.7 cm, L knee: 40.7 cm  PALPATION: Tender over mid- inferior incision.  LOWER EXTREMITY ROM:    Active  Right 11/25/22 Right 12/01/22  Hip flexion    Hip extension    Hip abduction    Hip adduction    Hip internal rotation    Hip external rotation    Knee flexion 109 116  Knee extension 0 0  Ankle dorsiflexion    Ankle plantarflexion    Ankle inversion    Ankle eversion     (Blank rows = not tested)   LOWER EXTREMITY MMT: Right hip flexion is 4-/5 and right knee extension is 4/5.  GAIT: WNL with no AD today in clinic (11/25/22)  TODAY'S TREATMENT:                                                                                                                              DATE:                     4/8    EXERCISE LOG  Exercise Repetitions and Resistance Comments  Nustep L4, seat 8 x17 min   Knee flexors 30# x30 reps   Knee extensors 10# x30 reps   Leg press 1 pl, seat 6 x20 reps   Lunges 14" step x20 reps 5 sec holds   Forward step up 6" step RLE x20 reps   Lateral step up 6" step RLE x15 reps    Blank cell = exercise not performed today   Modalities  Date: 12/08/22 Vaso: Knee, Med, 10 mins, Edema  ASSESSMENT:  CLINICAL IMPRESSION: Patient presented in clinic with reports of no R knee pain. Patient progressed with machine strengthening with no complaints. Patient had greater difficulty with lateral step ups per patient report. Normal vasopneumatic response  noted following removal of the modality.  OBJECTIVE IMPAIRMENTS: Abnormal gait, decreased activity tolerance, decreased ROM, decreased strength, increased edema, and pain.   ACTIVITY LIMITATIONS: locomotion level  PARTICIPATION LIMITATIONS: cleaning and  laundry  REHAB POTENTIAL: Excellent  CLINICAL DECISION MAKING: Stable/uncomplicated  EVALUATION COMPLEXITY: Low  GOALS:  SHORT TERM GOALS: Target date: 11/24/22 Ind with an initial HEP. Goal status: On-going  2.  Full active right knee extension.  Goal status: MET  LONG TERM GOALS: Target date: 02/08/23.  Ind with an advanced HEP.  Goal status: On-going  2.  Active knee flexion to 120-125 degrees+ so the patient can perform functional tasks and do so with pain not > 2-3/10.  Goal status: On-going  3.  Increase right hip and knee strength to a solid 5/5 to provide good stability for accomplishment of functional activities.  Goal status: On-going  4.  Decrease edema to within 2.5 cms of non-affected side to assist with pain reduction and range of motion gains.  Goal status: MET  5.  Perform a reciprocating stair gait with one railing with pain not > 2-3/10.  Goal status: On-going  PLAN:  PT FREQUENCY: 2x/week  PT DURATION: 6 weeks  PLANNED INTERVENTIONS: Therapeutic exercises, Therapeutic activity, Neuromuscular re-education, Gait training, Patient/Family education, Self Care, Electrical stimulation, Cryotherapy, Moist heat, Vasopneumatic device, and Manual therapy  PLAN FOR NEXT SESSION: Nustep with progression to recumbent bike.  Progress into TKA protocol.  Vasopneumatic.  Marvell Fuller, PTA 12/08/22 11:17 AM

## 2022-12-10 ENCOUNTER — Ambulatory Visit: Payer: Medicare HMO | Admitting: Physical Therapy

## 2022-12-10 ENCOUNTER — Encounter: Payer: Self-pay | Admitting: Physical Therapy

## 2022-12-10 DIAGNOSIS — R600 Localized edema: Secondary | ICD-10-CM

## 2022-12-10 DIAGNOSIS — M25661 Stiffness of right knee, not elsewhere classified: Secondary | ICD-10-CM | POA: Diagnosis not present

## 2022-12-10 DIAGNOSIS — M25561 Pain in right knee: Secondary | ICD-10-CM | POA: Diagnosis not present

## 2022-12-10 DIAGNOSIS — M6281 Muscle weakness (generalized): Secondary | ICD-10-CM | POA: Diagnosis not present

## 2022-12-10 DIAGNOSIS — G8929 Other chronic pain: Secondary | ICD-10-CM

## 2022-12-10 NOTE — Therapy (Addendum)
OUTPATIENT PHYSICAL THERAPY LOWER EXTREMITY TREATMENT   Patient Name: Tiffany Bradley MRN: [id number] DOB:1946/02/04, 77 y.o., female Today's Date: 12/10/2022  END OF SESSION:  PT End of Session - 12/10/22 1034     Visit Number 10    Number of Visits 12    Date for PT Re-Evaluation 02/08/23    Authorization Type FOTO AT LEAST EVERY 5TH VISIT.  PROGRESS NOTE AT 10TH VISIT.  KX MODIFIER AFTER 15 VISITS.    PT Start Time 1032    PT Stop Time 1113    PT Time Calculation (min) 41 min    Activity Tolerance Patient tolerated treatment well    Behavior During Therapy WFL for tasks assessed/performed            Past Medical History:  Diagnosis Date   Arthritis    Back pain    Cataract    DJD (degenerative joint disease)    Full dentures    GERD (gastroesophageal reflux disease)    Glaucoma    History of bleeding ulcers    patient reports multiple hospitalizations remotely   History of total left hip replacement    Hypertension    Pre-diabetes    Wears glasses    Past Surgical History:  Procedure Laterality Date   BREAST REDUCTION SURGERY Bilateral 04/25/2013   Procedure: MAMMARY REDUCTION  (BREAST);  Surgeon: Louisa Second, MD;  Location: [facility];  Service: Plastics;  Laterality: Bilateral;   COLONOSCOPY     2008, normal per patient   COLONOSCOPY N/A 01/16/2017   Procedure: COLONOSCOPY;  Surgeon: West Bali, MD;  [facility];  Service: Endoscopy;  Laterality: N/A;  830    COSMETIC SURGERY     ESOPHAGOGASTRODUODENOSCOPY N/A 01/16/2017   Procedure: ESOPHAGOGASTRODUODENOSCOPY (EGD);  Surgeon: West Bali, MD;  [facility];  Service: Endoscopy;  Laterality: N/A;   PARTIAL COLECTOMY N/A 06/29/2016   Procedure: PARTIAL COLECTOMY;  Surgeon: Ancil Linsey, MD;  [facility];  Service: General;  Laterality: N/A;   REDUCTION MAMMAPLASTY     TOTAL HIP ARTHROPLASTY Left 02/28/2022   Procedure: LEFT TOTAL HIP ARTHROPLASTY  ANTERIOR APPROACH;  Surgeon: Kathryne Hitch, MD;  Location: [facility];  Service: Orthopedics;  Laterality: Left;   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   Procedure: TOTAL KNEE ARTHROPLASTY;  Surgeon: Vickki Hearing, MD;  [facility];  Service: Orthopedics;  Laterality: Left;  DePuy    TOTAL KNEE ARTHROPLASTY Right 10/14/2022   Procedure: TOTAL KNEE ARTHROPLASTY;  Surgeon: Vickki Hearing, MD;  [facility];  Service: Orthopedics;  Laterality: Right;   TUBAL LIGATION     Patient Active Problem List   Diagnosis Date Noted   S/P TKR (total knee replacement), 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 11/21/2022   Unilateral primary osteoarthritis, right knee 10/14/2022   Primary osteoarthritis of right knee 10/14/2022   Status post left hip replacement 02/28/2022   Small bowel obstruction 12/13/2021   Hyperglycemia 12/13/2021   Unilateral primary osteoarthritis, left hip 12/12/2021   Viral upper respiratory tract infection 03/05/2021   History of retinal detachment 03/12/2020   Pure hypercholesterolemia 03/12/2020   PUD (peptic ulcer disease) 05/09/2019   OAB (overactive bladder) 05/09/2019   Lumbar back pain 05/09/2019   DDD (degenerative disc disease), lumbar 05/09/2019   Status post total knee replacement, 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 02/11/2018   Iron deficiency anemia 04/28/2017   Vitamin D deficiency 04/28/2017   Osteopenia of lumbar spine 04/28/2017   Gastritis, erosive  Abdominal pain, epigastric 12/12/2016   Colon cancer screening 11/03/2016   Essential hypertension 11/03/2016   Primary osteoarthritis of both knees 11/03/2016   GERD (gastroesophageal reflux disease) 11/03/2016   Age-related osteoporosis without current pathological fracture 11/03/2016   Perforation of sigmoid colon 06/29/2016   REFERRING PROVIDER: Fuller Canada MD  REFERRING DIAG: S/p right total knee replacement.  THERAPY DIAG:  Chronic pain of right knee  Muscle weakness (generalized)  Localized  edema  Stiffness of right knee, not elsewhere classified  Rationale for Evaluation and Treatment: Rehabilitation  ONSET DATE: 10/24/12 (date of surgery).  SUBJECTIVE:   SUBJECTIVE STATEMENT: Reports only stiffness.  PERTINENT HISTORY: Left total knee and total hip replacement.  PAIN: Are you having pain? No  PRECAUTIONS: Other: No ultrasound.  PATIENT GOALS: Not have right knee pain and do more.  NEXT MD VISIT: 01/05/23  OBJECTIVE:  PATIENT SURVEYS:  FOTO 85.    EDEMA:  Circumferential: R knee: 42.7 cm, L knee: 40.7 cm  PALPATION: Tender over mid- inferior incision.  LOWER EXTREMITY ROM:    Active  Right 11/25/22 Right 12/01/22  Hip flexion    Hip extension    Hip abduction    Hip adduction    Hip internal rotation    Hip external rotation    Knee flexion 109 116  Knee extension 0 0  Ankle dorsiflexion    Ankle plantarflexion    Ankle inversion    Ankle eversion     (Blank rows = not tested)   LOWER EXTREMITY MMT: Right hip flexion is 4-/5 and right knee extension is 4/5.  GAIT: WNL with no AD today in clinic (11/25/22)  TODAY'S TREATMENT:                                                                                                                              DATE:                     4/10    EXERCISE LOG  Exercise Repetitions and Resistance Comments  Nustep L4, seat 8 x16 min   Knee flexors 40# x30 reps   Knee extensors 20# x30 reps   Leg press 2 pl, seat 6 x20 reps   Step down 4" step RLE x20 reps   Lateral step up 4" step RLE x15 reps    Blank cell = exercise not performed today   Modalities  Date: 12/10/22 Vaso: Knee, Med, 10 mins, Edema  ASSESSMENT:  CLINICAL IMPRESSION: Patient presented in clinic with knee stiffness only. Patient reports walking up to a mile at [place] and back to doing most yardwork but still assisted with mowing by her child. Patient able to tolerate therex even with increase in resistance. Functional training also  continued with step downs to simulate stairs. Normal vasopneumatic response noted following removal of the modality.  OBJECTIVE IMPAIRMENTS: Abnormal gait, decreased activity tolerance, decreased ROM, decreased strength, increased edema, and pain.  ACTIVITY LIMITATIONS: locomotion level  PARTICIPATION LIMITATIONS: cleaning and laundry  REHAB POTENTIAL: Excellent  CLINICAL DECISION MAKING: Stable/uncomplicated  EVALUATION COMPLEXITY: Low  GOALS:  SHORT TERM GOALS: Target date: 11/24/22 Ind with an initial HEP. Goal status: On-going  2.  Full active right knee extension.  Goal status: MET  LONG TERM GOALS: Target date: 02/08/23.  Ind with an advanced HEP.  Goal status: On-going  2.  Active knee flexion to 120-125 degrees+ so the patient can perform functional tasks and do so with pain not > 2-3/10.  Goal status: On-going  3.  Increase right hip and knee strength to a solid 5/5 to provide good stability for accomplishment of functional activities.  Goal status: On-going  4.  Decrease edema to within 2.5 cms of non-affected side to assist with pain reduction and range of motion gains.  Goal status: MET  5.  Perform a reciprocating stair gait with one railing with pain not > 2-3/10.  Goal status: On-going  PLAN:  PT FREQUENCY: 2x/week  PT DURATION: 6 weeks  PLANNED INTERVENTIONS: Therapeutic exercises, Therapeutic activity, Neuromuscular re-education, Gait training, Patient/Family education, Self Care, Electrical stimulation, Cryotherapy, Moist heat, Vasopneumatic device, and Manual therapy  PLAN FOR NEXT SESSION: Nustep with progression to recumbent bike.  Progress into TKA protocol.  Vasopneumatic.  Marvell FullerKelsey P Azel Gumina, PTA 12/10/22 11:19 AM  Progress Note Reporting Period 11/10/22 to 12/10/22  See note below for Objective Data and Assessment of Progress/Goals. Good progression toward goals.  STG #2 met.    Italyhad Applegate MPT

## 2022-12-15 ENCOUNTER — Ambulatory Visit: Payer: Medicare HMO | Admitting: Physical Therapy

## 2022-12-15 ENCOUNTER — Encounter: Payer: Self-pay | Admitting: Physical Therapy

## 2022-12-15 DIAGNOSIS — M25661 Stiffness of right knee, not elsewhere classified: Secondary | ICD-10-CM | POA: Diagnosis not present

## 2022-12-15 DIAGNOSIS — M25561 Pain in right knee: Secondary | ICD-10-CM | POA: Diagnosis not present

## 2022-12-15 DIAGNOSIS — G8929 Other chronic pain: Secondary | ICD-10-CM | POA: Diagnosis not present

## 2022-12-15 DIAGNOSIS — M6281 Muscle weakness (generalized): Secondary | ICD-10-CM

## 2022-12-15 DIAGNOSIS — R600 Localized edema: Secondary | ICD-10-CM

## 2022-12-15 NOTE — Therapy (Signed)
OUTPATIENT PHYSICAL THERAPY LOWER EXTREMITY TREATMENT   Patient Name: Tiffany Bradley MRN: [id number] DOB:[birth year], 77 y.o., female Today's Date: 12/15/2022  END OF SESSION:  PT End of Session - 12/15/22 1048     Visit Number 11    Number of Visits 12    Date for PT Re-Evaluation 02/08/23    Authorization Type FOTO AT LEAST EVERY 5TH VISIT.  PROGRESS NOTE AT 10TH VISIT.  KX MODIFIER AFTER 15 VISITS.    PT Start Time 1030    PT Stop Time 1115    PT Time Calculation (min) 45 min    Activity Tolerance Patient tolerated treatment well    Behavior During Therapy WFL for tasks assessed/performed            Past Medical History:  Diagnosis Date   Arthritis    Back pain    Cataract    DJD (degenerative joint disease)    Full dentures    GERD (gastroesophageal reflux disease)    Glaucoma    History of bleeding ulcers    patient reports multiple hospitalizations remotely   History of total left hip replacement    Hypertension    Pre-diabetes    Wears glasses    Past Surgical History:  Procedure Laterality Date   BREAST REDUCTION SURGERY Bilateral 04/25/2013   Procedure: MAMMARY REDUCTION  (BREAST);  Surgeon: Louisa Second, MD;  Location: [facility];  Service: Plastics;  Laterality: Bilateral;   COLONOSCOPY     2008, normal per patient   COLONOSCOPY N/A 01/16/2017   Procedure: COLONOSCOPY;  Surgeon: West Bali, MD;  Location: [facility];  Service: Endoscopy;  Laterality: N/A;  830    COSMETIC SURGERY     ESOPHAGOGASTRODUODENOSCOPY N/A 01/16/2017   Procedure: ESOPHAGOGASTRODUODENOSCOPY (EGD);  Surgeon: West Bali, MD;  Location: [facility];  Service: Endoscopy;  Laterality: N/A;   PARTIAL COLECTOMY N/A 06/29/2016   Procedure: PARTIAL COLECTOMY;  Surgeon: Ancil Linsey, MD;  Location: [facility];  Service: General;  Laterality: N/A;   REDUCTION MAMMAPLASTY     TOTAL HIP ARTHROPLASTY Left 02/28/2022   Procedure: LEFT TOTAL HIP ARTHROPLASTY  ANTERIOR APPROACH;  Surgeon: Kathryne Hitch, MD;  Location: [facility];  Service: Orthopedics;  Laterality: Left;   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   Procedure: TOTAL KNEE ARTHROPLASTY;  Surgeon: Vickki Hearing, MD;  Location: [facility];  Service: Orthopedics;  Laterality: Left;  DePuy    TOTAL KNEE ARTHROPLASTY Right 10/14/2022   Procedure: TOTAL KNEE ARTHROPLASTY;  Surgeon: Vickki Hearing, MD;  Location: [facility];  Service: Orthopedics;  Laterality: Right;   TUBAL LIGATION     Patient Active Problem List   Diagnosis Date Noted   S/P TKR (total knee replacement), 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 11/21/2022   Unilateral primary osteoarthritis, right knee 10/14/2022   Primary osteoarthritis of right knee 10/14/2022   Status post left hip replacement 02/28/2022   Small bowel obstruction 12/13/2021   Hyperglycemia 12/13/2021   Unilateral primary osteoarthritis, left hip 12/12/2021   Viral upper respiratory tract infection 03/05/2021   History of retinal detachment 03/12/2020   Pure hypercholesterolemia 03/12/2020   PUD (peptic ulcer disease) 05/09/2019   OAB (overactive bladder) 05/09/2019   Lumbar back pain 05/09/2019   DDD (degenerative disc disease), lumbar 05/09/2019   Status post total knee replacement, 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 02/11/2018   Iron deficiency anemia 04/28/2017   Vitamin D deficiency 04/28/2017   Osteopenia of lumbar spine 04/28/2017   Gastritis, erosive  Abdominal pain, epigastric 12/12/2016   Colon cancer screening 11/03/2016   Essential hypertension 11/03/2016   Primary osteoarthritis of both knees 11/03/2016   GERD (gastroesophageal reflux disease) 11/03/2016   Age-related osteoporosis without current pathological fracture 11/03/2016   Perforation of sigmoid colon 06/29/2016   REFERRING PROVIDER: Fuller Canada MD  REFERRING DIAG: S/p right total knee replacement.  THERAPY DIAG:  Chronic pain of right knee  Muscle weakness (generalized)  Localized  edema  Stiffness of right knee, not elsewhere classified  Rationale for Evaluation and Treatment: Rehabilitation  ONSET DATE: 10/24/12 (date of surgery).  SUBJECTIVE:   SUBJECTIVE STATEMENT: Reports only stiffness.  PERTINENT HISTORY: Left total knee and total hip replacement.  PAIN: Are you having pain? No  PRECAUTIONS: Other: No ultrasound.  PATIENT GOALS: Not have right knee pain and do more.  NEXT MD VISIT: 01/05/23  OBJECTIVE:  PATIENT SURVEYS:  FOTO 85.    EDEMA:  Circumferential: R knee: 42.7 cm, L knee: 40.7 cm  PALPATION: Tender over mid- inferior incision.  LOWER EXTREMITY ROM:    Active  Right 11/25/22 Right 12/01/22 Right 12/15/22  Hip flexion     Hip extension     Hip abduction     Hip adduction     Hip internal rotation     Hip external rotation     Knee flexion 109 116 115A/120P  Knee extension 0 0   Ankle dorsiflexion     Ankle plantarflexion     Ankle inversion     Ankle eversion      (Blank rows = not tested)   LOWER EXTREMITY MMT: Right hip flexion is 4-/5 and right knee extension is 4/5.  GAIT: WNL with no AD today in clinic (11/25/22)  TODAY'S TREATMENT:                                                                                                                              DATE:                     12/15/22:   EXERCISE LOG  Repetitions and Resistance Comments  L4 x 15 min   10# x 3 minutes   Recumbent bike x 5 minutes progressing to seat 2.    Vasopneumatic on medium x 15 minutes to patient's right knee.  ASSESSMENT:  CLINICAL IMPRESSION: Excellent progress with progression to recumbent bike on seat 2.  Able to achieve 115 degrees of active right knee flexion and passive to 120 degrees.  OBJECTIVE IMPAIRMENTS: Abnormal gait, decreased activity tolerance, decreased ROM, decreased strength, increased edema, and pain.   ACTIVITY LIMITATIONS: locomotion level  PARTICIPATION LIMITATIONS: cleaning and laundry  REHAB POTENTIAL:  Excellent  CLINICAL DECISION MAKING: Stable/uncomplicated  EVALUATION COMPLEXITY: Low  GOALS:  SHORT TERM GOALS: Target date: 11/24/22 Ind with an initial HEP. Goal status: On-going  2.  Full active right knee extension.  Goal status: MET  LONG TERM GOALS: Target date:  02/08/23.  Ind with an advanced HEP.  Goal status: On-going  2.  Active knee flexion to 120-125 degrees+ so the patient can perform functional tasks and do so with pain not > 2-3/10.  Goal status: On-going  3.  Increase right hip and knee strength to a solid 5/5 to provide good stability for accomplishment of functional activities.  Goal status: On-going  4.  Decrease edema to within 2.5 cms of non-affected side to assist with pain reduction and range of motion gains.  Goal status: MET  5.  Perform a reciprocating stair gait with one railing with pain not > 2-3/10.  Goal status: On-going  PLAN:  PT FREQUENCY: 2x/week  PT DURATION: 6 weeks  PLANNED INTERVENTIONS: Therapeutic exercises, Therapeutic activity, Neuromuscular re-education, Gait training, Patient/Family education, Self Care, Electrical stimulation, Cryotherapy, Moist heat, Vasopneumatic device, and Manual therapy  PLAN FOR NEXT SESSION: Nustep x 5 minutes then to recumbent bike.    [place] Uriyah Massimo MPT

## 2022-12-17 ENCOUNTER — Ambulatory Visit: Payer: Medicare HMO

## 2022-12-17 DIAGNOSIS — M25661 Stiffness of right knee, not elsewhere classified: Secondary | ICD-10-CM | POA: Diagnosis not present

## 2022-12-17 DIAGNOSIS — G8929 Other chronic pain: Secondary | ICD-10-CM

## 2022-12-17 DIAGNOSIS — R600 Localized edema: Secondary | ICD-10-CM | POA: Diagnosis not present

## 2022-12-17 DIAGNOSIS — M6281 Muscle weakness (generalized): Secondary | ICD-10-CM

## 2022-12-17 DIAGNOSIS — M25561 Pain in right knee: Secondary | ICD-10-CM | POA: Diagnosis not present

## 2022-12-17 NOTE — Therapy (Addendum)
OUTPATIENT PHYSICAL THERAPY LOWER EXTREMITY TREATMENT   Patient Name: Tiffany Bradley MRN: [id number] DOB:11-20-1945, 77 y.o., female Today's Date: 12/17/2022  END OF SESSION:  PT End of Session - 12/17/22 1032     Visit Number 12    Number of Visits 12    Date for PT Re-Evaluation 02/08/23    Authorization Type FOTO AT LEAST EVERY 5TH VISIT.  PROGRESS NOTE AT 10TH VISIT.  KX MODIFIER AFTER 15 VISITS.    PT Start Time 1030    PT Stop Time 1114    PT Time Calculation (min) 44 min    Activity Tolerance Patient tolerated treatment well    Behavior During Therapy WFL for tasks assessed/performed            Past Medical History:  Diagnosis Date   Arthritis    Back pain    Cataract    DJD (degenerative joint disease)    Full dentures    GERD (gastroesophageal reflux disease)    Glaucoma    History of bleeding ulcers    patient reports multiple hospitalizations remotely   History of total left hip replacement    Hypertension    Pre-diabetes    Wears glasses    Past Surgical History:  Procedure Laterality Date   BREAST REDUCTION SURGERY Bilateral 04/25/2013   Procedure: MAMMARY REDUCTION  (BREAST);  Surgeon: Louisa Second, MD;  Location: [facility];  Service: Plastics;  Laterality: Bilateral;   COLONOSCOPY     2008, normal per patient   COLONOSCOPY N/A 01/16/2017   Procedure: COLONOSCOPY;  Surgeon: West Bali, MD;  Location: [facility];  Service: Endoscopy;  Laterality: N/A;  830    COSMETIC SURGERY     ESOPHAGOGASTRODUODENOSCOPY N/A 01/16/2017   Procedure: ESOPHAGOGASTRODUODENOSCOPY (EGD);  Surgeon: West Bali, MD;  Location: [facility];  Service: Endoscopy;  Laterality: N/A;   PARTIAL COLECTOMY N/A 06/29/2016   Procedure: PARTIAL COLECTOMY;  Surgeon: Ancil Linsey, MD;  Location: [facility];  Service: General;  Laterality: N/A;   REDUCTION MAMMAPLASTY     TOTAL HIP ARTHROPLASTY Left 02/28/2022   Procedure: LEFT TOTAL HIP ARTHROPLASTY  ANTERIOR APPROACH;  Surgeon: Kathryne Hitch, MD;  Location: [facility];  Service: Orthopedics;  Laterality: Left;   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   Procedure: TOTAL KNEE ARTHROPLASTY;  Surgeon: Vickki Hearing, MD;  Location: [facility];  Service: Orthopedics;  Laterality: Left;  DePuy    TOTAL KNEE ARTHROPLASTY Right 10/14/2022   Procedure: TOTAL KNEE ARTHROPLASTY;  Surgeon: Vickki Hearing, MD;  Location: [facility];  Service: Orthopedics;  Laterality: Right;   TUBAL LIGATION     Patient Active Problem List   Diagnosis Date Noted   S/P TKR (total knee replacement), 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 11/21/2022   Unilateral primary osteoarthritis, right knee 10/14/2022   Primary osteoarthritis of right knee 10/14/2022   Status post left hip replacement 02/28/2022   Small bowel obstruction 12/13/2021   Hyperglycemia 12/13/2021   Unilateral primary osteoarthritis, left hip 12/12/2021   Viral upper respiratory tract infection 03/05/2021   History of retinal detachment 03/12/2020   Pure hypercholesterolemia 03/12/2020   PUD (peptic ulcer disease) 05/09/2019   OAB (overactive bladder) 05/09/2019   Lumbar back pain 05/09/2019   DDD (degenerative disc disease), lumbar 05/09/2019   Status post total knee replacement, 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 02/11/2018   Iron deficiency anemia 04/28/2017   Vitamin D deficiency 04/28/2017   Osteopenia of lumbar spine 04/28/2017   Gastritis, erosive  Abdominal pain, epigastric 12/12/2016   Colon cancer screening 11/03/2016   Essential hypertension 11/03/2016   Primary osteoarthritis of both knees 11/03/2016   GERD (gastroesophageal reflux disease) 11/03/2016   Age-related osteoporosis without current pathological fracture 11/03/2016   Perforation of sigmoid colon 06/29/2016   REFERRING PROVIDER: Fuller Canada MD  REFERRING DIAG: S/p right total knee replacement.  THERAPY DIAG:  Chronic pain of right knee  Muscle weakness (generalized)  Localized  edema  Stiffness of right knee, not elsewhere classified  Rationale for Evaluation and Treatment: Rehabilitation  ONSET DATE: 10/24/12 (date of surgery).  SUBJECTIVE:   SUBJECTIVE STATEMENT: Reports only stiffness.  PERTINENT HISTORY: Left total knee and total hip replacement.  PAIN: Are you having pain? No  PRECAUTIONS: Other: No ultrasound.  PATIENT GOALS: Not have right knee pain and do more.  NEXT MD VISIT: 01/05/23  OBJECTIVE:  PATIENT SURVEYS:  FOTO 85.    EDEMA:  Circumferential: R knee: 42.7 cm, L knee: 40.7 cm  PALPATION: Tender over mid- inferior incision.  LOWER EXTREMITY ROM:    Active  Right 11/25/22 Right 12/01/22 Right 12/15/22  Hip flexion     Hip extension     Hip abduction     Hip adduction     Hip internal rotation     Hip external rotation     Knee flexion 109 116 115A/120P  Knee extension 0 0   Ankle dorsiflexion     Ankle plantarflexion     Ankle inversion     Ankle eversion      (Blank rows = not tested)   LOWER EXTREMITY MMT: Right hip flexion is 4-/5 and right knee extension is 4/5.  GAIT: WNL with no AD today in clinic (11/25/22)  TODAY'S TREATMENT:                                                                                                                              DATE:                                                         EXERCISE LOG  Exercise Repetitions and Resistance Comments  Nustep Lvl 3 x 5 mins   Recumbent bike Lvl 3 x 15 mins; seat 4   Cybex Knee Flexion 40# x 3 mins   Cybex Knee Extension 20# x 3 mins   Leg Press 2 pl; seat 6; x 2 mins   Step Down 4" step x 25 reps   Lateral Step Up 4" step x 20 reps    Blank cell = exercise not performed today    ASSESSMENT:  CLINICAL IMPRESSION: Pt arrives for today's treatment denying any pain.  Pt reports being able to performing all activities that she would like to without issue.  Pt  able to demonstrate 120 degrees of active right knee flexion meeting her goal.   Pt demonstrating 4+ to 5/5 strength with right knee musculature.  Pt also able to navigate four stairs with reciprocal patter without issue.  Pt encouraged to call facility with any questions or concerns. Pt ready for discharge at this time.  OBJECTIVE IMPAIRMENTS: Abnormal gait, decreased activity tolerance, decreased ROM, decreased strength, increased edema, and pain.   ACTIVITY LIMITATIONS: locomotion level  PARTICIPATION LIMITATIONS: cleaning and laundry  REHAB POTENTIAL: Excellent  CLINICAL DECISION MAKING: Stable/uncomplicated  EVALUATION COMPLEXITY: Low  GOALS:  SHORT TERM GOALS: Target date: 11/24/22 Ind with an initial HEP. Goal status: MET  2.  Full active right knee extension.  Goal status: MET  LONG TERM GOALS: Target date: 02/08/23.  Ind with an advanced HEP.  Goal status: MET  2.  Active knee flexion to 120-125 degrees+ so the patient can perform functional tasks and do so with pain not > 2-3/10.  Goal status: MET  3.  Increase right hip and knee strength to a solid 5/5 to provide good stability for accomplishment of functional activities.  Goal status: PARTIALLY MET  4.  Decrease edema to within 2.5 cms of non-affected side to assist with pain reduction and range of motion gains.  Goal status: MET  5.  Perform a reciprocating stair gait with one railing with pain not > 2-3/10.  Goal status: MET  PLAN:  PT FREQUENCY: 2x/week  PT DURATION: 6 weeks  PLANNED INTERVENTIONS: Therapeutic exercises, Therapeutic activity, Neuromuscular re-education, Gait training, Patient/Family education, Self Care, Electrical stimulation, Cryotherapy, Moist heat, Vasopneumatic device, and Manual therapy  PLAN FOR NEXT SESSION: Nustep x 5 minutes then to recumbent bike.      PHYSICAL THERAPY DISCHARGE SUMMARY  Visits from Start of Care: 12.  Current functional level related to goals / functional outcomes: See above.   Remaining deficits: See goal section.   Education  / Equipment: HEP.   Patient agrees to discharge. Patient goals were partially met. Patient is being discharged due to being pleased with the current functional level.    [place] Applegate MPT

## 2022-12-29 ENCOUNTER — Encounter: Payer: Self-pay | Admitting: Family Medicine

## 2022-12-29 ENCOUNTER — Ambulatory Visit (INDEPENDENT_AMBULATORY_CARE_PROVIDER_SITE_OTHER): Payer: Medicare HMO | Admitting: Family Medicine

## 2022-12-29 VITALS — BP 132/79 | HR 82 | Temp 98.7°F | Ht 66.0 in | Wt 171.0 lb

## 2022-12-29 DIAGNOSIS — E7800 Pure hypercholesterolemia, unspecified: Secondary | ICD-10-CM

## 2022-12-29 DIAGNOSIS — Z0001 Encounter for general adult medical examination with abnormal findings: Secondary | ICD-10-CM | POA: Diagnosis not present

## 2022-12-29 DIAGNOSIS — Z96652 Presence of left artificial knee joint: Secondary | ICD-10-CM

## 2022-12-29 DIAGNOSIS — M810 Age-related osteoporosis without current pathological fracture: Secondary | ICD-10-CM

## 2022-12-29 DIAGNOSIS — Z96651 Presence of right artificial knee joint: Secondary | ICD-10-CM

## 2022-12-29 DIAGNOSIS — Z0000 Encounter for general adult medical examination without abnormal findings: Secondary | ICD-10-CM

## 2022-12-29 DIAGNOSIS — N3281 Overactive bladder: Secondary | ICD-10-CM

## 2022-12-29 DIAGNOSIS — D509 Iron deficiency anemia, unspecified: Secondary | ICD-10-CM | POA: Diagnosis not present

## 2022-12-29 DIAGNOSIS — M5136 Other intervertebral disc degeneration, lumbar region: Secondary | ICD-10-CM

## 2022-12-29 DIAGNOSIS — I10 Essential (primary) hypertension: Secondary | ICD-10-CM

## 2022-12-29 DIAGNOSIS — M51369 Other intervertebral disc degeneration, lumbar region without mention of lumbar back pain or lower extremity pain: Secondary | ICD-10-CM

## 2022-12-29 NOTE — Patient Instructions (Signed)
Come in for fasting labs. ? ?Preventive Care 65 Years and Older, Female ?Preventive care refers to lifestyle choices and visits with your health care provider that can promote health and wellness. Preventive care visits are also called wellness exams. ?What can I expect for my preventive care visit? ?Counseling ?Your health care provider may ask you questions about your: ?Medical history, including: ?Past medical problems. ?Family medical history. ?Pregnancy and menstrual history. ?History of falls. ?Current health, including: ?Memory and ability to understand (cognition). ?Emotional well-being. ?Home life and relationship well-being. ?Sexual activity and sexual health. ?Lifestyle, including: ?Alcohol, nicotine or tobacco, and drug use. ?Access to firearms. ?Diet, exercise, and sleep habits. ?Work and work environment. ?Sunscreen use. ?Safety issues such as seatbelt and bike helmet use. ?Physical exam ?Your health care provider will check your: ?Height and weight. These may be used to calculate your BMI (body mass index). BMI is a measurement that tells if you are at a healthy weight. ?Waist circumference. This measures the distance around your waistline. This measurement also tells if you are at a healthy weight and may help predict your risk of certain diseases, such as type 2 diabetes and high blood pressure. ?Heart rate and blood pressure. ?Body temperature. ?Skin for abnormal spots. ?What immunizations do I need? ? ?Vaccines are usually given at various ages, according to a schedule. Your health care provider will recommend vaccines for you based on your age, medical history, and lifestyle or other factors, such as travel or where you work. ?What tests do I need? ?Screening ?Your health care provider may recommend screening tests for certain conditions. This may include: ?Lipid and cholesterol levels. ?Hepatitis C test. ?Hepatitis B test. ?HIV (human immunodeficiency virus) test. ?STI (sexually transmitted  infection) testing, if you are at risk. ?Lung cancer screening. ?Colorectal cancer screening. ?Diabetes screening. This is done by checking your blood sugar (glucose) after you have not eaten for a while (fasting). ?Mammogram. Talk with your health care provider about how often you should have regular mammograms. ?BRCA-related cancer screening. This may be done if you have a family history of breast, ovarian, tubal, or peritoneal cancers. ?Bone density scan. This is done to screen for osteoporosis. ?Talk with your health care provider about your test results, treatment options, and if necessary, the need for more tests. ?Follow these instructions at home: ?Eating and drinking ? ?Eat a diet that includes fresh fruits and vegetables, whole grains, lean protein, and low-fat dairy products. Limit your intake of foods with high amounts of sugar, saturated fats, and salt. ?Take vitamin and mineral supplements as recommended by your health care provider. ?Do not drink alcohol if your health care provider tells you not to drink. ?If you drink alcohol: ?Limit how much you have to 0-1 drink a day. ?Know how much alcohol is in your drink. In the [place] one drink equals one 12 oz bottle of beer (355 mL), one 5 oz glass of wine (148 mL), or one 1? oz glass of hard liquor (44 mL). ?Lifestyle ?Brush your teeth every morning and night with fluoride toothpaste. Floss one time each day. ?Exercise for at least 30 minutes 5 or more days each week. ?Do not use any products that contain nicotine or tobacco. These products include cigarettes, chewing tobacco, and vaping devices, such as e-cigarettes. If you need help quitting, ask your health care provider. ?Do not use drugs. ?If you are sexually active, practice safe sex. Use a condom or other form of protection in order to prevent STIs. ?  Take aspirin only as told by your health care provider. Make sure that you understand how much to take and what form to take. Work with your health care  provider to find out whether it is safe and beneficial for you to take aspirin daily. ?Ask your health care provider if you need to take a cholesterol-lowering medicine (statin). ?Find healthy ways to manage stress, such as: ?Meditation, yoga, or listening to music. ?Journaling. ?Talking to a trusted person. ?Spending time with friends and family. ?Minimize exposure to UV radiation to reduce your risk of skin cancer. ?Safety ?Always wear your seat belt while driving or riding in a vehicle. ?Do not drive: ?If you have been drinking alcohol. Do not ride with someone who has been drinking. ?When you are tired or distracted. ?While texting. ?If you have been using any mind-altering substances or drugs. ?Wear a helmet and other protective equipment during sports activities. ?If you have firearms in your house, make sure you follow all gun safety procedures. ?What's next? ?Visit your health care provider once a year for an annual wellness visit. ?Ask your health care provider how often you should have your eyes and teeth checked. ?Stay up to date on all vaccines. ?This information is not intended to replace advice given to you by your health care provider. Make sure you discuss any questions you have with your health care provider. ?Document Revised: 02/13/2021 Document Reviewed: 02/13/2021 ?Elsevier Patient Education ? 2023 [employer] ? ?

## 2022-12-29 NOTE — Progress Notes (Unsigned)
Tiffany Bradley is a 77 y.o. female presents to office today for annual physical exam examination.    Concerns today include: 1. ***  Occupation: ***, Marital status: ***, Substance use: *** Diet: ***, Exercise: *** Last eye exam: *** Last dental exam: *** Last colonoscopy: *** Last mammogram: *** Last pap smear: *** Refills needed today: *** Immunizations needed: Immunization History  Administered Date(s) Administered   Moderna Sars-Covid-2 Vaccination 10/06/2019, 11/04/2019, 06/23/2020, 12/13/2020     Past Medical History:  Diagnosis Date   Arthritis    Back pain    Cataract    DJD (degenerative joint disease)    Full dentures    GERD (gastroesophageal reflux disease)    Glaucoma    History of bleeding ulcers    patient reports multiple hospitalizations remotely   History of total left hip replacement    Hypertension    Perforation of sigmoid colon (HCC) 06/29/2016   Pre-diabetes    Unilateral primary osteoarthritis, left hip 12/12/2021   Unilateral primary osteoarthritis, right knee 10/14/2022   Wears glasses    Social History   Socioeconomic History   Marital status: Divorced    Spouse name: Not on file   Number of children: 3   Years of education: 11   Highest education level: Not on file  Occupational History   Occupation: Retired    Associate Professor: [employer]  Tobacco Use   Smoking status: Former    Packs/day: 3    Types: Cigarettes    Quit date: 04/20/1993    Years since quitting: 29.7   Smokeless tobacco: Never  Vaping Use   Vaping Use: Never used  Substance and Sexual Activity   Alcohol use: Not Currently   Drug use: No   Sexual activity: Not Currently  Other Topics Concern   Not on file  Social History Narrative   Tiffany Bradley is retired and  her 80 year old granddaughter lives with her. She has two daughters and one son, all local that she sees regularly. She is active in church and sings in the choir. She enjoys crafting, yard work, and shopping.     Social Determinants of Health   Financial Resource Strain: Low Risk  (05/16/2021)   Overall Financial Resource Strain (CARDIA)    Difficulty of Paying Living Expenses: Not hard at all  Food Insecurity: No Food Insecurity (10/14/2022)   Hunger Vital Sign    Worried About Running Out of Food in the Last Year: Never true    Ran Out of Food in the Last Year: Never true  Transportation Needs: No Transportation Needs (10/14/2022)   PRAPARE - Administrator, Civil Service (Medical): No    Lack of Transportation (Non-Medical): No  Physical Activity: Sufficiently Active (05/16/2021)   Exercise Vital Sign    Days of Exercise per Week: 7 days    Minutes of Exercise per Session: 30 min  Stress: No Stress Concern Present (05/16/2021)   [employer] of Occupational Health - Occupational Stress Questionnaire    Feeling of Stress : Not at all  Social Connections: Moderately Integrated (05/16/2021)   Social Connection and Isolation Panel [NHANES]    Frequency of Communication with Friends and Family: More than three times a week    Frequency of Social Gatherings with Friends and Family: More than three times a week    Attends Religious Services: More than 4 times per year    Active Member of [employer] or Organizations: Yes    Attends [employer]  or Organization Meetings: More than 4 times per year    Marital Status: Divorced  Intimate Partner Violence: Not At Risk (10/14/2022)   Humiliation, Afraid, Rape, and Kick questionnaire    Fear of Current or Ex-Partner: No    Emotionally Abused: No    Physically Abused: No    Sexually Abused: No   Past Surgical History:  Procedure Laterality Date   BREAST REDUCTION SURGERY Bilateral 04/25/2013   Procedure: MAMMARY REDUCTION  (BREAST);  Surgeon: Louisa Second, MD;  Location: [facility];  Service: Plastics;  Laterality: Bilateral;   COLONOSCOPY     2008, normal per patient   COLONOSCOPY N/A 01/16/2017   Procedure: COLONOSCOPY;   Surgeon: West Bali, MD;  Location: [facility];  Service: Endoscopy;  Laterality: N/A;  830    COSMETIC SURGERY     ESOPHAGOGASTRODUODENOSCOPY N/A 01/16/2017   Procedure: ESOPHAGOGASTRODUODENOSCOPY (EGD);  Surgeon: West Bali, MD;  Location: [facility];  Service: Endoscopy;  Laterality: N/A;   PARTIAL COLECTOMY N/A 06/29/2016   Procedure: PARTIAL COLECTOMY;  Surgeon: Ancil Linsey, MD;  Location: [facility];  Service: General;  Laterality: N/A;   REDUCTION MAMMAPLASTY     TOTAL HIP ARTHROPLASTY Left 02/28/2022   Procedure: LEFT TOTAL HIP ARTHROPLASTY ANTERIOR APPROACH;  Surgeon: Kathryne Hitch, MD;  Location: [facility];  Service: Orthopedics;  Laterality: Left;   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   Procedure: TOTAL KNEE ARTHROPLASTY;  Surgeon: Vickki Hearing, MD;  Location: [facility];  Service: Orthopedics;  Laterality: Left;  DePuy    TOTAL KNEE ARTHROPLASTY Right 10/14/2022   Procedure: TOTAL KNEE ARTHROPLASTY;  Surgeon: Vickki Hearing, MD;  Location: [facility];  Service: Orthopedics;  Laterality: Right;   TUBAL LIGATION     Family History  Problem Relation Age of Onset   Kidney disease Mother    Stroke Mother    Heart attack Father    Hyperlipidemia Sister    Hypertension Sister    Diabetes Sister    Hypertension Sister    Hyperlipidemia Sister    Hypertension Brother    Hyperlipidemia Brother    Colon cancer Neg Hx    Breast cancer Neg Hx     Current Outpatient Medications:    amLODipine (NORVASC) 10 MG tablet, Take 1 tablet (10 mg total) by mouth daily., Disp: 90 tablet, Rfl: 3   aspirin 325 MG tablet, Take 1 tablet (325 mg total) by mouth daily., Disp: 30 tablet, Rfl: 0   atorvastatin (LIPITOR) 20 MG tablet, Take 1 tablet (20 mg total) by mouth daily., Disp: 90 tablet, Rfl: 3   Biotin 10 MG CAPS, Take 10 mg of amoxicillin by mouth daily., Disp: , Rfl:    Calcium Carb-Cholecalciferol (CALCIUM 600+D3 PO), Take 1 tablet by mouth daily., Disp: ,  Rfl:    Cholecalciferol (VITAMIN D) 50 MCG (2000 UT) CAPS, Take 2,000 Units by mouth daily., Disp: , Rfl:    ferrous sulfate 325 (65 FE) MG tablet, Take 325 mg by mouth daily., Disp: , Rfl:    gabapentin (NEURONTIN) 300 MG capsule, Take 1 capsule (300 mg total) by mouth 2 (two) times daily., Disp: 180 capsule, Rfl: 1   Glucosamine HCl-MSM (GLUCOSAMINE-MSM PO), Take 2 tablets by mouth daily., Disp: , Rfl:    mometasone (NASONEX) 50 MCG/ACT nasal spray, Use 2 spray(s) in each nostril once daily (Patient taking differently: Place 2 sprays into the nose daily.), Disp: 17 g, Rfl: 2   Multiple Vitamins-Minerals (MULTIVITAMIN  WITH MINERALS) tablet, Take 1 tablet by mouth daily. With Zinc, Disp: , Rfl:    omeprazole (PRILOSEC) 40 MG capsule, Take 1 capsule (40 mg total) by mouth daily., Disp: 90 capsule, Rfl: 3   polyethylene glycol (MIRALAX) 17 g packet, Take 17 g by mouth daily., Disp: 28 each, Rfl: 2   tiZANidine (ZANAFLEX) 4 MG tablet, Take 1 tablet (4 mg total) by mouth every 8 (eight) hours as needed for muscle spasms., Disp: 60 tablet, Rfl: 2   tolterodine (DETROL LA) 2 MG 24 hr capsule, Take 1 capsule (2 mg total) by mouth daily., Disp: 90 capsule, Rfl: 3   torsemide (DEMADEX) 20 MG tablet, Take 20 mg by mouth daily., Disp: , Rfl:    traMADol (ULTRAM) 50 MG tablet, Take 1 tablet (50 mg total) by mouth every 6 (six) hours., Disp: 30 tablet, Rfl: 0  Current Facility-Administered Medications:    bupivacaine-meloxicam ER (ZYNRELEF) injection 400 mg, 400 mg, Infiltration, Once, Vickki Hearing, MD  No Known Allergies   ROS: Review of Systems {ros; complete:30496}    Physical exam {Exam, Complete:[phone]}    Assessment/ Plan: [name] here for annual physical exam.   No problem-specific Assessment & Plan notes found for this encounter.   Counseled on healthy lifestyle choices, including diet (rich in fruits, vegetables and lean meats and low in salt and simple carbohydrates) and  exercise (at least 30 minutes of moderate physical activity daily).  Patient to follow up in 1 year for annual exam or sooner if needed.  Jayson Waterhouse M. Nadine Counts, DO

## 2022-12-30 ENCOUNTER — Other Ambulatory Visit: Payer: Medicare HMO

## 2022-12-30 ENCOUNTER — Other Ambulatory Visit: Payer: Self-pay | Admitting: Family Medicine

## 2022-12-30 DIAGNOSIS — M810 Age-related osteoporosis without current pathological fracture: Secondary | ICD-10-CM

## 2022-12-30 DIAGNOSIS — I10 Essential (primary) hypertension: Secondary | ICD-10-CM

## 2022-12-30 DIAGNOSIS — E7800 Pure hypercholesterolemia, unspecified: Secondary | ICD-10-CM

## 2022-12-30 DIAGNOSIS — Z1231 Encounter for screening mammogram for malignant neoplasm of breast: Secondary | ICD-10-CM

## 2022-12-30 DIAGNOSIS — Z96651 Presence of right artificial knee joint: Secondary | ICD-10-CM

## 2022-12-30 DIAGNOSIS — D509 Iron deficiency anemia, unspecified: Secondary | ICD-10-CM | POA: Diagnosis not present

## 2022-12-30 LAB — CBC
Hematocrit: 33.9 % — ABNORMAL LOW (ref 34.0–46.6)
MCHC: 30.4 g/dL — ABNORMAL LOW (ref 31.5–35.7)
RBC: 4.33 x10E6/uL (ref 3.77–5.28)

## 2022-12-30 LAB — CMP14+EGFR
ALT: 20 IU/L (ref 0–32)
AST: 25 IU/L (ref 0–40)
Albumin/Globulin Ratio: 1.7 (ref 1.2–2.2)
Globulin, Total: 2.7 g/dL (ref 1.5–4.5)
eGFR: 96 mL/min/{1.73_m2} (ref >59)

## 2022-12-30 LAB — LIPID PANEL
Chol/HDL Ratio: 2.1 ratio (ref 0.0–4.4)
HDL: 63 mg/dL (ref >39)
Triglycerides: 40 mg/dL (ref 0–149)

## 2022-12-30 LAB — IRON,TIBC AND FERRITIN PANEL

## 2022-12-30 LAB — TSH

## 2022-12-30 LAB — VITAMIN D 25 HYDROXY (VIT D DEFICIENCY, FRACTURES)

## 2022-12-30 MED ORDER — GABAPENTIN 300 MG PO CAPS: 300.0000 mg | ORAL_CAPSULE | Freq: Two times a day (BID) | ORAL | 3 refills | Status: DC

## 2022-12-30 MED ORDER — OMEPRAZOLE 40 MG PO CPDR: 40.0000 mg | DELAYED_RELEASE_CAPSULE | Freq: Every day | ORAL | 3 refills | Status: DC

## 2022-12-30 MED ORDER — TOLTERODINE TARTRATE ER 2 MG PO CP24: 2.0000 mg | ORAL_CAPSULE | Freq: Every day | ORAL | 3 refills | Status: DC

## 2022-12-30 MED ORDER — ATORVASTATIN CALCIUM 20 MG PO TABS: 20.0000 mg | ORAL_TABLET | Freq: Every day | ORAL | 3 refills | Status: DC

## 2022-12-30 MED ORDER — AMLODIPINE BESYLATE 10 MG PO TABS: 10.0000 mg | ORAL_TABLET | Freq: Every day | ORAL | 3 refills | Status: DC

## 2022-12-31 ENCOUNTER — Telehealth: Payer: Self-pay | Admitting: Family Medicine

## 2022-12-31 LAB — CMP14+EGFR
Albumin: 4.5 g/dL (ref 3.8–4.8)
Alkaline Phosphatase: 144 IU/L — ABNORMAL HIGH (ref 44–121)
BUN/Creatinine Ratio: 23 (ref 12–28)
BUN: 12 mg/dL (ref 8–27)
Bilirubin Total: 0.4 mg/dL (ref 0.0–1.2)
CO2: 23 mmol/L (ref 20–29)
Calcium: 9.5 mg/dL (ref 8.7–10.3)
Chloride: 103 mmol/L (ref 96–106)
Creatinine, Ser: 0.53 mg/dL — ABNORMAL LOW (ref 0.57–1.00)
Glucose: 94 mg/dL (ref 70–99)
Potassium: 4.7 mmol/L (ref 3.5–5.2)
Sodium: 142 mmol/L (ref 134–144)
Total Protein: 7.2 g/dL (ref 6.0–8.5)

## 2022-12-31 LAB — CBC
Hemoglobin: 10.3 g/dL — ABNORMAL LOW (ref 11.1–15.9)
MCH: 23.8 pg — ABNORMAL LOW (ref 26.6–33.0)
MCV: 78 fL — ABNORMAL LOW (ref 79–97)
Platelets: 325 10*3/uL (ref 150–450)
RDW: 16.9 % — ABNORMAL HIGH (ref 11.7–15.4)
WBC: 3.6 10*3/uL (ref 3.4–10.8)

## 2022-12-31 LAB — LIPID PANEL
Cholesterol, Total: 135 mg/dL (ref 100–199)
LDL Chol Calc (NIH): 62 mg/dL (ref 0–99)
VLDL Cholesterol Cal: 10 mg/dL (ref 5–40)

## 2022-12-31 LAB — IRON,TIBC AND FERRITIN PANEL
Ferritin: 46 ng/mL (ref 15–150)
Iron: 48 ug/dL (ref 27–139)
UIBC: 399 ug/dL — ABNORMAL HIGH (ref 118–369)

## 2022-12-31 NOTE — Telephone Encounter (Signed)
Please call patient back with lab results. Telephone encounter for results was accidentally closed.

## 2023-01-01 ENCOUNTER — Ambulatory Visit (INDEPENDENT_AMBULATORY_CARE_PROVIDER_SITE_OTHER): Payer: Medicare HMO | Admitting: Orthopedic Surgery

## 2023-01-01 DIAGNOSIS — Z96651 Presence of right artificial knee joint: Secondary | ICD-10-CM

## 2023-01-01 NOTE — Progress Notes (Signed)
   Chief Complaint  Patient presents with   Follow-up    Right total knee replacement October 14, 2022    Tiffany Bradley is doing well she finished her therapy she is walking independently she has no pain she has full range of motion deformity was corrected patient can follow-up in June for x-rays of both total knees and we will go on a June schedule for her annual visits

## 2023-01-01 NOTE — Telephone Encounter (Signed)
Left vm for cb

## 2023-01-08 NOTE — Telephone Encounter (Signed)
Attempted to call pt , no answer left her a detailed message to call back for her results

## 2023-01-12 ENCOUNTER — Ambulatory Visit
Admission: RE | Admit: 2023-01-12 | Discharge: 2023-01-12 | Disposition: A | Discharge status: Home / Self Care | Payer: Medicare HMO | Source: Ambulatory Visit | Attending: Family Medicine | Admitting: Family Medicine

## 2023-01-12 DIAGNOSIS — Z1231 Encounter for screening mammogram for malignant neoplasm of breast: Secondary | ICD-10-CM | POA: Diagnosis not present

## 2023-02-03 DIAGNOSIS — K219 Gastro-esophageal reflux disease without esophagitis: Secondary | ICD-10-CM | POA: Diagnosis not present

## 2023-02-03 DIAGNOSIS — R609 Edema, unspecified: Secondary | ICD-10-CM | POA: Diagnosis not present

## 2023-02-03 DIAGNOSIS — Z96649 Presence of unspecified artificial hip joint: Secondary | ICD-10-CM | POA: Diagnosis not present

## 2023-02-03 DIAGNOSIS — K5900 Constipation, unspecified: Secondary | ICD-10-CM | POA: Diagnosis not present

## 2023-02-03 DIAGNOSIS — I10 Essential (primary) hypertension: Secondary | ICD-10-CM | POA: Diagnosis not present

## 2023-02-03 DIAGNOSIS — Z8249 Family history of ischemic heart disease and other diseases of the circulatory system: Secondary | ICD-10-CM | POA: Diagnosis not present

## 2023-02-03 DIAGNOSIS — E785 Hyperlipidemia, unspecified: Secondary | ICD-10-CM | POA: Diagnosis not present

## 2023-02-03 DIAGNOSIS — M62838 Other muscle spasm: Secondary | ICD-10-CM | POA: Diagnosis not present

## 2023-02-03 DIAGNOSIS — H409 Unspecified glaucoma: Secondary | ICD-10-CM | POA: Diagnosis not present

## 2023-02-03 DIAGNOSIS — N3941 Urge incontinence: Secondary | ICD-10-CM | POA: Diagnosis not present

## 2023-02-03 DIAGNOSIS — M810 Age-related osteoporosis without current pathological fracture: Secondary | ICD-10-CM | POA: Diagnosis not present

## 2023-02-03 DIAGNOSIS — Z87891 Personal history of nicotine dependence: Secondary | ICD-10-CM | POA: Diagnosis not present

## 2023-02-26 ENCOUNTER — Encounter: Payer: Self-pay | Admitting: Orthopedic Surgery

## 2023-02-26 ENCOUNTER — Other Ambulatory Visit (INDEPENDENT_AMBULATORY_CARE_PROVIDER_SITE_OTHER): Payer: Medicare HMO

## 2023-02-26 ENCOUNTER — Ambulatory Visit: Payer: Medicare HMO | Admitting: Orthopedic Surgery

## 2023-02-26 ENCOUNTER — Other Ambulatory Visit: Payer: Self-pay

## 2023-02-26 VITALS — BP 137/86 | HR 86

## 2023-02-26 DIAGNOSIS — M1711 Unilateral primary osteoarthritis, right knee: Secondary | ICD-10-CM

## 2023-02-26 DIAGNOSIS — M1712 Unilateral primary osteoarthritis, left knee: Secondary | ICD-10-CM

## 2023-02-26 DIAGNOSIS — Z96652 Presence of left artificial knee joint: Secondary | ICD-10-CM

## 2023-02-26 DIAGNOSIS — Z96651 Presence of right artificial knee joint: Secondary | ICD-10-CM | POA: Diagnosis not present

## 2023-02-26 NOTE — Progress Notes (Signed)
Chief Complaint  Patient presents with   Knee Pain    Follow up knee  no problems doing well and can bend knees  Left knee DOS 02/11/18 Right knee DOS 10/14/22    77 year old female status post bilateral total knee replacements.  On the left she had a Sigma on the right she had to  She had severe valgus deformities which were corrected with surgery and she is doing very well.  She says she can squat and kneel she loves that her knees are no longer knock-knee  She has excellent function walks without any supportive gait device and no limping  She has excellent range of motion her images show no complications with the implants  Recommend follow-up as needed  Encounter Diagnoses  Name Primary?   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

## 2023-03-19 ENCOUNTER — Other Ambulatory Visit: Payer: Self-pay | Admitting: Family Medicine

## 2023-03-19 DIAGNOSIS — K279 Peptic ulcer, site unspecified, unspecified as acute or chronic, without hemorrhage or perforation: Secondary | ICD-10-CM

## 2023-03-19 DIAGNOSIS — K219 Gastro-esophageal reflux disease without esophagitis: Secondary | ICD-10-CM

## 2023-04-29 ENCOUNTER — Other Ambulatory Visit: Payer: Self-pay | Admitting: Orthopedic Surgery

## 2023-04-29 DIAGNOSIS — M170 Bilateral primary osteoarthritis of knee: Secondary | ICD-10-CM

## 2023-04-29 DIAGNOSIS — M5136 Other intervertebral disc degeneration, lumbar region: Secondary | ICD-10-CM

## 2023-05-19 ENCOUNTER — Ambulatory Visit (INDEPENDENT_AMBULATORY_CARE_PROVIDER_SITE_OTHER): Payer: Medicare HMO

## 2023-05-19 VITALS — Ht 67.0 in | Wt 175.0 lb

## 2023-05-19 DIAGNOSIS — Z0000 Encounter for general adult medical examination without abnormal findings: Secondary | ICD-10-CM | POA: Diagnosis not present

## 2023-05-19 NOTE — Progress Notes (Signed)
Subjective:   Tiffany Bradley is a 77 y.o. female who presents for Medicare Annual (Subsequent) preventive examination.  Visit Complete: Virtual  I connected with  Tiffany Bradley on 05/19/23 by a audio enabled telemedicine application and verified that I am speaking with the correct person using two identifiers.  Patient Location: Home  Provider Location: Home Office  I discussed the limitations of evaluation and management by telemedicine. The patient expressed understanding and agreed to proceed.  Patient Medicare AWV questionnaire was completed by the patient on 05/19/2023; I have confirmed that all information answered by patient is correct and no changes since this date.  Cardiac Risk Factors include: advanced age (>43men, >29 women);dyslipidemia;hypertensionVital Signs: Unable to obtain new vitals due to this being a telehealth visit.      Objective:    Today's Vitals   05/19/23 1302  Weight: 175 lb (79.4 kg)  Height: 5\' 7"  (1.702 m)   Body mass index is 27.41 kg/m.     05/19/2023    1:05 PM 10/18/2022   11:16 AM 10/14/2022    8:44 AM 10/10/2022   12:04 PM 05/19/2022    8:32 AM 02/28/2022    4:35 PM 02/18/2022   10:07 AM  Advanced Directives  Does Patient Have a Medical Advance Directive? No No No No No No No  Would patient like information on creating a medical advance directive? Yes (MAU/Ambulatory/Procedural Areas - Information given)  No - Patient declined No - Patient declined No - Patient declined No - Patient declined     Current Medications (verified) Outpatient Encounter Medications as of 05/19/2023  Medication Sig   amLODipine (NORVASC) 10 MG tablet Take 1 tablet (10 mg total) by mouth daily.   aspirin 325 MG tablet Take 1 tablet (325 mg total) by mouth daily.   atorvastatin (LIPITOR) 20 MG tablet Take 1 tablet (20 mg total) by mouth daily.   Biotin 10 MG CAPS Take 10 mg of amoxicillin by mouth daily.   Calcium Carb-Cholecalciferol (CALCIUM 600+D3 PO) Take 1 tablet  by mouth daily.   Cholecalciferol (VITAMIN D) 50 MCG (2000 UT) CAPS Take 2,000 Units by mouth daily.   ferrous sulfate 325 (65 FE) MG tablet Take 325 mg by mouth daily.   gabapentin (NEURONTIN) 300 MG capsule Take 1 capsule (300 mg total) by mouth 2 (two) times daily.   Glucosamine HCl-MSM (GLUCOSAMINE-MSM PO) Take 2 tablets by mouth daily.   mometasone (NASONEX) 50 MCG/ACT nasal spray Use 2 spray(s) in each nostril once daily (Patient taking differently: Place 2 sprays into the nose daily.)   Multiple Vitamins-Minerals (MULTIVITAMIN WITH MINERALS) tablet Take 1 tablet by mouth daily. With Zinc   omeprazole (PRILOSEC) 40 MG capsule Take 1 capsule (40 mg total) by mouth daily.   polyethylene glycol (MIRALAX) 17 g packet Take 17 g by mouth daily.   tiZANidine (ZANAFLEX) 4 MG tablet TAKE 1 TABLET BY MOUTH EVERY 8 HOURS AS NEEDED FOR MUSCLE SPASM   tolterodine (DETROL LA) 2 MG 24 hr capsule Take 1 capsule (2 mg total) by mouth daily.   torsemide (DEMADEX) 20 MG tablet Take 20 mg by mouth daily.   traMADol (ULTRAM) 50 MG tablet Take 1 tablet (50 mg total) by mouth every 6 (six) hours.   No facility-administered encounter medications on file as of 05/19/2023.    Allergies (verified) Patient has no known allergies.   History: Past Medical History:  Diagnosis Date   Arthritis    Back pain  Cataract    DJD (degenerative joint disease)    Full dentures    GERD (gastroesophageal reflux disease)    Glaucoma    History of bleeding ulcers    patient reports multiple hospitalizations remotely   History of total left hip replacement    Hypertension    Perforation of sigmoid colon (HCC) 06/29/2016   Pre-diabetes    Unilateral primary osteoarthritis, left hip 12/12/2021   Unilateral primary osteoarthritis, right knee 10/14/2022   Wears glasses    Past Surgical History:  Procedure Laterality Date   BREAST REDUCTION SURGERY Bilateral 04/25/2013   Procedure: MAMMARY REDUCTION  (BREAST);   Surgeon: Louisa Second, MD;  Location: [facility];  Service: Plastics;  Laterality: Bilateral;   COLONOSCOPY     2008, normal per patient   COLONOSCOPY N/A 01/16/2017   Procedure: COLONOSCOPY;  Surgeon: West Bali, MD;  Location: [facility];  Service: Endoscopy;  Laterality: N/A;  830    COSMETIC SURGERY     ESOPHAGOGASTRODUODENOSCOPY N/A 01/16/2017   Procedure: ESOPHAGOGASTRODUODENOSCOPY (EGD);  Surgeon: West Bali, MD;  Location: [facility];  Service: Endoscopy;  Laterality: N/A;   PARTIAL COLECTOMY N/A 06/29/2016   Procedure: PARTIAL COLECTOMY;  Surgeon: Ancil Linsey, MD;  Location: [facility];  Service: General;  Laterality: N/A;   REDUCTION MAMMAPLASTY     TOTAL HIP ARTHROPLASTY Left 02/28/2022   Procedure: LEFT TOTAL HIP ARTHROPLASTY ANTERIOR APPROACH;  Surgeon: Kathryne Hitch, MD;  Location: [facility];  Service: Orthopedics;  Laterality: Left;   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   Procedure: TOTAL KNEE ARTHROPLASTY;  Surgeon: Vickki Hearing, MD;  Location: [facility];  Service: Orthopedics;  Laterality: Left;  DePuy    TOTAL KNEE ARTHROPLASTY Right 10/14/2022   Procedure: TOTAL KNEE ARTHROPLASTY;  Surgeon: Vickki Hearing, MD;  Location: [facility];  Service: Orthopedics;  Laterality: Right;   TUBAL LIGATION     Family History  Problem Relation Age of Onset   Kidney disease Mother    Stroke Mother    Heart attack Father    Hyperlipidemia Sister    Hypertension Sister    Diabetes Sister    Hypertension Sister    Hyperlipidemia Sister    Hypertension Brother    Hyperlipidemia Brother    Colon cancer Neg Hx    Breast cancer Neg Hx    Social History   Socioeconomic History   Marital status: Divorced    Spouse name: Not on file   Number of children: 3   Years of education: 11   Highest education level: Not on file  Occupational History   Occupation: Retired    Associate Professor: [employer]  Tobacco Use   Smoking status: Former     Current packs/day: 0.00    Types: Cigarettes    Quit date: 04/20/1993    Years since quitting: 30.0   Smokeless tobacco: Never  Vaping Use   Vaping status: Never Used  Substance and Sexual Activity   Alcohol use: Not Currently   Drug use: No   Sexual activity: Not Currently  Other Topics Concern   Not on file  Social History Narrative   Jamise is retired and  her 50 year old granddaughter lives with her. She has two daughters and one son, all local that she sees regularly. She is active in church and sings in the choir. She enjoys crafting, yard work, and shopping.    Social Determinants of Health   Financial Resource Strain:  Low Risk  (05/19/2023)   Overall Financial Resource Strain (CARDIA)    Difficulty of Paying Living Expenses: Not hard at all  Food Insecurity: No Food Insecurity (05/19/2023)   Hunger Vital Sign    Worried About Running Out of Food in the Last Year: Never true    Ran Out of Food in the Last Year: Never true  Transportation Needs: No Transportation Needs (05/19/2023)   PRAPARE - Administrator, Civil Service (Medical): No    Lack of Transportation (Non-Medical): No  Physical Activity: Insufficiently Active (05/19/2023)   Exercise Vital Sign    Days of Exercise per Week: 3 days    Minutes of Exercise per Session: 30 min  Stress: No Stress Concern Present (05/19/2023)   [employer] of Occupational Health - Occupational Stress Questionnaire    Feeling of Stress : Not at all  Social Connections: Moderately Integrated (05/19/2023)   Social Connection and Isolation Panel [NHANES]    Frequency of Communication with Friends and Family: More than three times a week    Frequency of Social Gatherings with Friends and Family: More than three times a week    Attends Religious Services: More than 4 times per year    Active Member of [employer] or Organizations: Yes    Attends Engineer, structural: More than 4 times per year    Marital Status: Divorced     Tobacco Counseling Counseling given: Not Answered   Clinical Intake:  Pre-visit preparation completed: Yes  Pain : No/denies pain     Nutritional Risks: None Diabetes: No  How often do you need to have someone help you when you read instructions, pamphlets, or other written materials from your doctor or pharmacy?: 1 - Never  Interpreter Needed?: No  Information entered by :: Renie Ora, LPN   Activities of Daily Living    05/19/2023    1:05 PM 10/14/2022    4:13 PM  In your present state of health, do you have any difficulty performing the following activities:  Hearing? 0 0  Vision? 0 0  Difficulty concentrating or making decisions? 0 0  Walking or climbing stairs? 0 1  Dressing or bathing? 0 1  Doing errands, shopping? 0 0  Preparing Food and eating ? N   Using the Toilet? N   In the past six months, have you accidently leaked urine? N   Do you have problems with loss of bowel control? N   Managing your Medications? N   Managing your Finances? N   Housekeeping or managing your Housekeeping? N     Patient Care Team: Raliegh Ip, DO as PCP - General (Family Medicine) West Bali, MD (Inactive) as Consulting Physician (Gastroenterology) Vickki Hearing, MD as Consulting Physician (Orthopedic Surgery) Delora Fuel, OD (Optometry)  Indicate any recent Medical Services you may have received from other than [facility] providers in the past year (date may be approximate).     Assessment:   This is a routine wellness examination for Cheryle.  Hearing/Vision screen Vision Screening - Comments:: Wears rx glasses - up to date with routine eye exams with  Dr.Johnson    Goals Addressed             This Visit's Progress    Patient Stated   On track    05/19/2022 AWV Goal: Fall Prevention  Over the next year, patient will decrease their risk for falls by: Using assistive devices, such as a cane or walker, as  needed Identifying fall risks within  their home and correcting them by: Removing throw rugs Adding handrails to stairs or ramps Removing clutter and keeping a clear pathway throughout the home Increasing light, especially at night Adding shower handles/bars Raising toilet seat Identifying potential personal risk factors for falls: Medication side effects Incontinence/urgency Vestibular dysfunction Hearing loss Musculoskeletal disorders Neurological disorders Orthostatic hypotension         Depression Screen    05/19/2023    1:04 PM 12/29/2022    9:09 AM 06/27/2022    9:43 AM 05/19/2022    8:39 AM 12/25/2021    3:07 PM 11/13/2021    9:19 AM 05/16/2021    8:15 AM  PHQ 2/9 Scores  PHQ - 2 Score 0 0 0 0 0 0 0  PHQ- 9 Score  0 0  0 0     Fall Risk    05/19/2023    1:02 PM 06/27/2022    9:43 AM 05/19/2022    8:39 AM 12/25/2021    3:07 PM 11/13/2021    9:18 AM  Fall Risk   Falls in the past year? 0 0 0 1 0  Number falls in past yr: 0   0   Injury with Fall? 0   0   Risk for fall due to : No Fall Risks   History of fall(s)   Follow up Falls prevention discussed  Falls evaluation completed Education provided     MEDICARE RISK AT HOME: Medicare Risk at Home Any stairs in or around the home?: Yes If so, are there any without handrails?: No Home free of loose throw rugs in walkways, pet beds, electrical cords, etc?: Yes Adequate lighting in your home to reduce risk of falls?: Yes Life alert?: No Use of a cane, walker or w/c?: No Grab bars in the bathroom?: Yes Shower chair or bench in shower?: Yes Elevated toilet seat or a handicapped toilet?: Yes  TIMED UP AND GO:  Was the test performed?  No    Cognitive Function:    10/18/2018    1:34 PM  MMSE - Mini Mental State Exam  Orientation to time 5  Orientation to Place 5  Registration 3  Attention/ Calculation 5  Recall 2  Language- name 2 objects 2  Language- repeat 1  Language- follow 3 step command 3  Language- read & follow direction 1  Write a  sentence 1  Copy design 1  Total score 29        05/19/2023    1:05 PM 05/19/2022    8:35 AM 11/09/2019   10:38 AM  6CIT Screen  What Year? 0 points 0 points 0 points  What month? 0 points 0 points 0 points  What time? 0 points 0 points 0 points  Count back from 20 0 points 0 points 0 points  Months in reverse 0 points 0 points 0 points  Repeat phrase 0 points 0 points 0 points  Total Score 0 points 0 points 0 points    Immunizations Immunization History  Administered Date(s) Administered   Moderna Sars-Covid-2 Vaccination 10/06/2019, 11/04/2019, 06/23/2020, 12/13/2020    TDAP status: Due, Education has been provided regarding the importance of this vaccine. Advised may receive this vaccine at local pharmacy or Health Dept. Aware to provide a copy of the vaccination record if obtained from local pharmacy or Health Dept. Verbalized acceptance and understanding.  Flu Vaccine status: Due, Education has been provided regarding the importance of this vaccine. Advised may receive  this vaccine at local pharmacy or Health Dept. Aware to provide a copy of the vaccination record if obtained from local pharmacy or Health Dept. Verbalized acceptance and understanding.  Pneumococcal vaccine status: Due, Education has been provided regarding the importance of this vaccine. Advised may receive this vaccine at local pharmacy or Health Dept. Aware to provide a copy of the vaccination record if obtained from local pharmacy or Health Dept. Verbalized acceptance and understanding.  Covid-19 vaccine status: Completed vaccines  Qualifies for Shingles Vaccine? Yes   Zostavax completed No   Shingrix Completed?: No.    Education has been provided regarding the importance of this vaccine. Patient has been advised to call insurance company to determine out of pocket expense if they have not yet received this vaccine. Advised may also receive vaccine at local pharmacy or Health Dept. Verbalized acceptance and  understanding.  Screening Tests Health Maintenance  Topic Date Due   Zoster Vaccines- Shingrix (1 of 2) Never done   INFLUENZA VACCINE  Never done   COVID-19 Vaccine (5 - 2023-24 season) 05/03/2023   Hepatitis C Screening  06/28/2023 (Originally 02/07/1964)   Pneumonia Vaccine 53+ Years old (1 of 1 - PCV) 12/29/2023 (Originally 02/08/2012)   DEXA SCAN  12/24/2023   MAMMOGRAM  01/12/2024   Medicare Annual Wellness (AWV)  05/18/2024   HPV VACCINES  Aged Out   DTaP/Tdap/Td  Discontinued   Colonoscopy  Discontinued    Health Maintenance  Health Maintenance Due  Topic Date Due   Zoster Vaccines- Shingrix (1 of 2) Never done   INFLUENZA VACCINE  Never done   COVID-19 Vaccine (5 - 2023-24 season) 05/03/2023    Colorectal cancer screening: No longer required.   Mammogram status: No longer required due to age .  Bone Density status: Completed 12/23/2021. Results reflect: Bone density results: OSTEOPOROSIS. Repeat every 2 years.  Lung Cancer Screening: (Low Dose CT Chest recommended if Age 6-80 years, 20 pack-year currently smoking OR have quit w/in 15years.) does not qualify.   Lung Cancer Screening Referral: n/a  Additional Screening:  Hepatitis C Screening: does not qualify; Completed 1027/2023  Vision Screening: Recommended annual ophthalmology exams for early detection of glaucoma and other disorders of the eye. Is the patient up to date with their annual eye exam?  Yes  Who is the provider or what is the name of the office in which the patient attends annual eye exams? Dr.johnson  If pt is not established with a provider, would they like to be referred to a provider to establish care? No .   Dental Screening: Recommended annual dental exams for proper oral hygiene   Community Resource Referral / Chronic Care Management: CRR required this visit?  No   CCM required this visit?  No     Plan:     I have personally reviewed and noted the following in the patient's  chart:   Medical and social history Use of alcohol, tobacco or illicit drugs  Current medications and supplements including opioid prescriptions. Patient is not currently taking opioid prescriptions. Functional ability and status Nutritional status Physical activity Advanced directives List of other physicians Hospitalizations, surgeries, and ER visits in previous 12 months Vitals Screenings to include cognitive, depression, and falls Referrals and appointments  In addition, I have reviewed and discussed with patient certain preventive protocols, quality metrics, and best practice recommendations. A written personalized care plan for preventive services as well as general preventive health recommendations were provided to patient.  Lorrene Reid, LPN   [id number]/[id number]   After Visit Summary: (MyChart) Due to this being a telephonic visit, the after visit summary with patients personalized plan was offered to patient via MyChart   Nurse Notes: none

## 2023-05-19 NOTE — Patient Instructions (Signed)
Ms. Tiffany Bradley , Thank you for taking time to come for your Medicare Wellness Visit. I appreciate your ongoing commitment to your health goals. Please review the following plan we discussed and let me know if I can assist you in the future.   Referrals/Orders/Follow-Ups/Clinician Recommendations: Aim for 30 minutes of exercise or brisk walking, 6-8 glasses of water, and 5 servings of fruits and vegetables each day.   This is a list of the screening recommended for you and due dates:  Health Maintenance  Topic Date Due   Zoster (Shingles) Vaccine (1 of 2) Never done   Flu Shot  Never done   COVID-19 Vaccine (5 - 2023-24 season) 05/03/2023   Hepatitis C Screening  06/28/2023*   Pneumonia Vaccine (1 of 1 - PCV) 12/29/2023*   DEXA scan (bone density measurement)  12/24/2023   Mammogram  01/12/2024   Medicare Annual Wellness Visit  05/18/2024   HPV Vaccine  Aged Out   DTaP/Tdap/Td vaccine  Discontinued   Colon Cancer Screening  Discontinued  *Topic was postponed. The date shown is not the original due date.    Advanced directives: (Copy Requested) Please bring a copy of your health care power of attorney and living will to the office to be added to your chart at your convenience.  Next Medicare Annual Wellness Visit scheduled for next year: Yes  Insert Preventive Care attachment Insert FALL PREVENTION attachment if needed

## 2023-06-20 ENCOUNTER — Other Ambulatory Visit: Payer: Self-pay | Admitting: Family Medicine

## 2023-06-20 ENCOUNTER — Other Ambulatory Visit: Payer: Self-pay | Admitting: Orthopedic Surgery

## 2023-06-20 DIAGNOSIS — M170 Bilateral primary osteoarthritis of knee: Secondary | ICD-10-CM

## 2023-06-20 DIAGNOSIS — J3089 Other allergic rhinitis: Secondary | ICD-10-CM

## 2023-06-20 DIAGNOSIS — M51369 Other intervertebral disc degeneration, lumbar region without mention of lumbar back pain or lower extremity pain: Secondary | ICD-10-CM

## 2023-08-19 IMAGING — DX DG ABDOMEN ACUTE W/ 1V CHEST
3 series · 3 of 3 positions shown · non-contrast
Comparison: CT Abdomen and Pelvis 12/13/2021.

CLINICAL DATA: 75-year-old female with abdominal pain and
constipation. Small-bowel obstruction on CT yesterday.

EXAM:
DG ABDOMEN ACUTE WITH 1 VIEW CHEST

[abdomen erect ap]
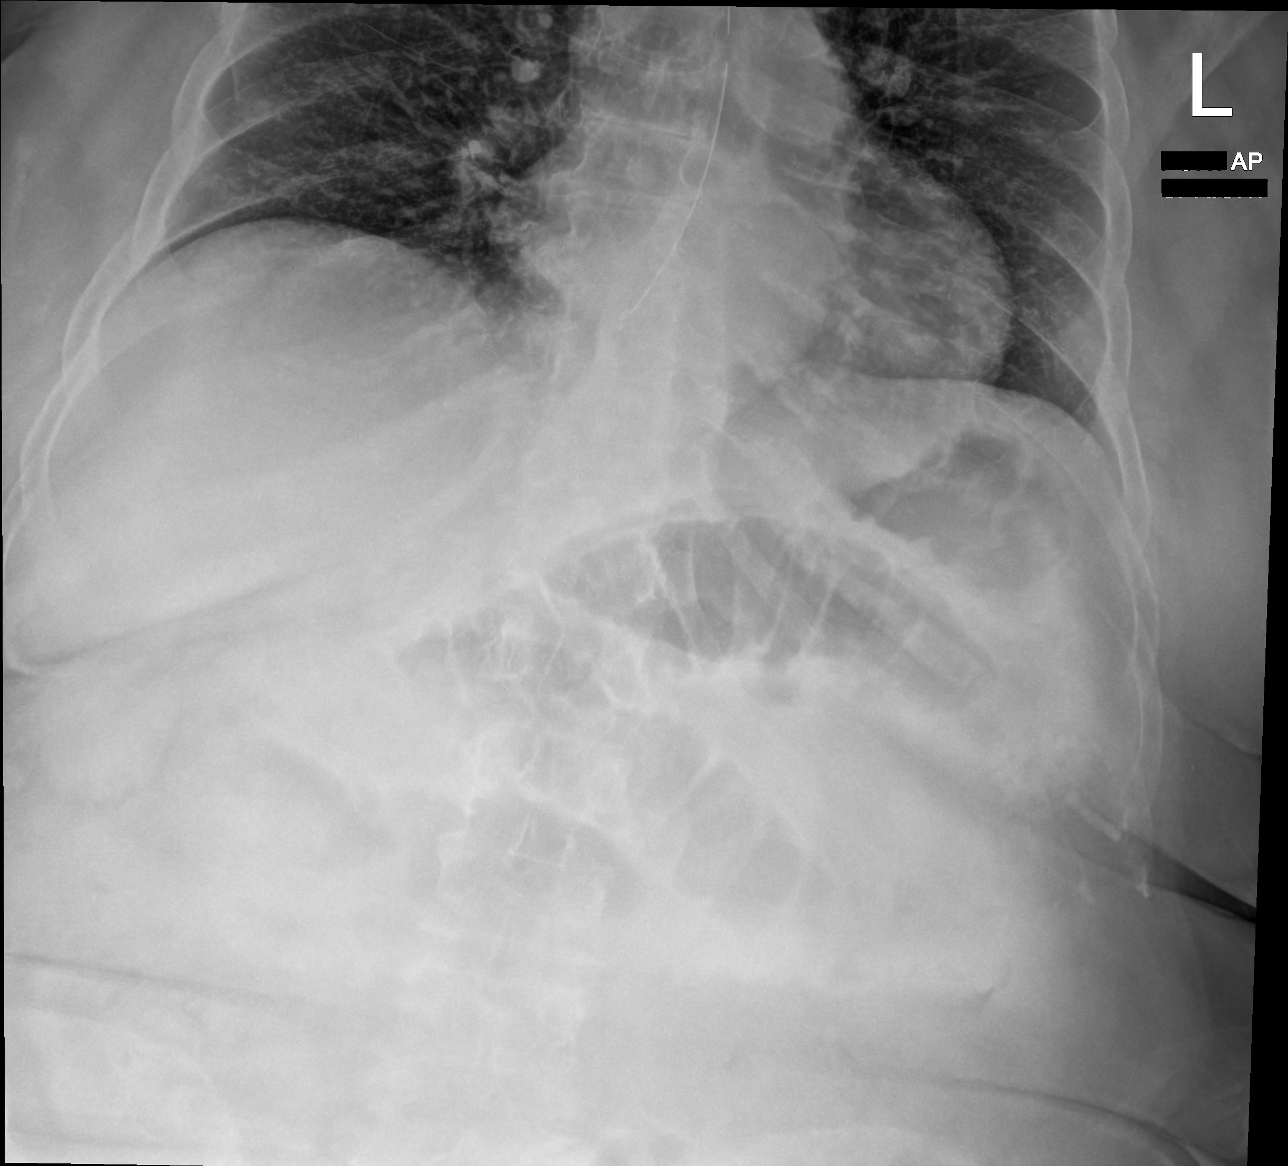

[abdomen supine]
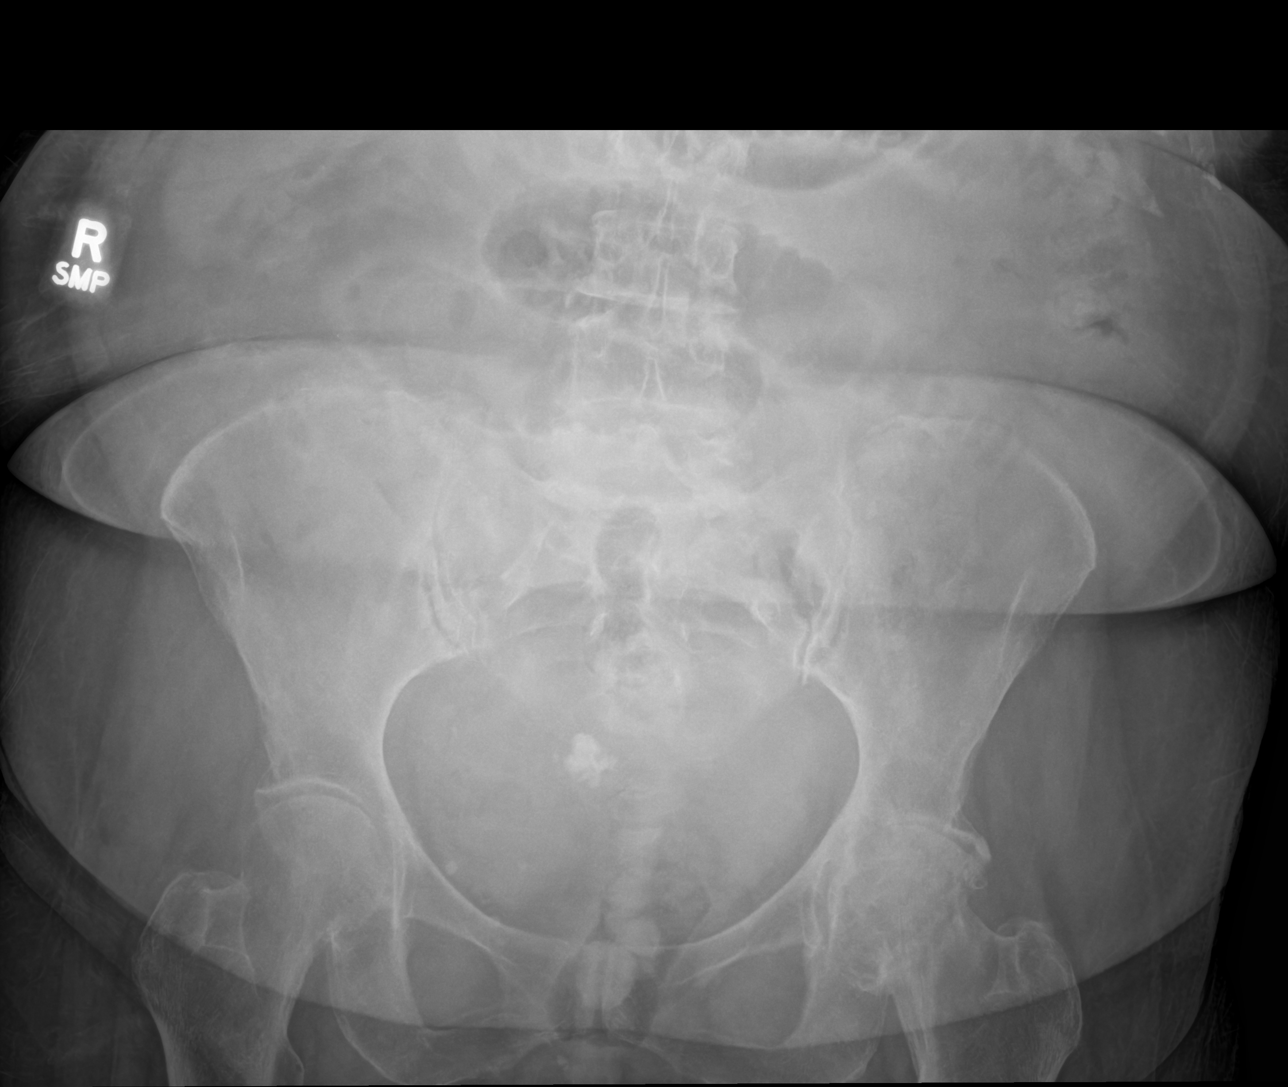

[chest ap grid]
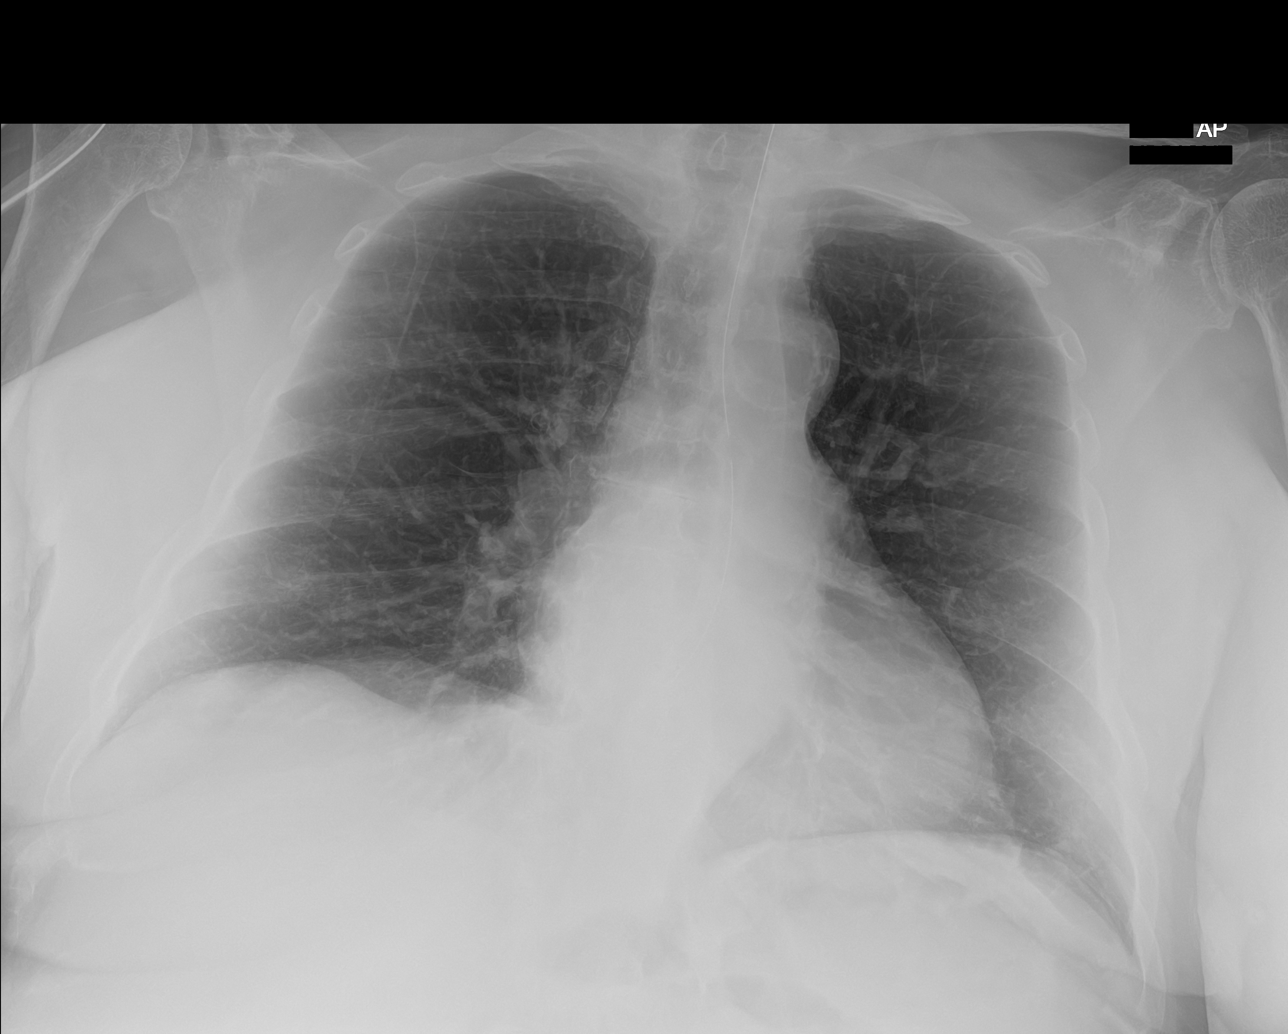

[3 of 3 positions shown; findings below may reference images not displayed]

FINDINGS: Portable upright and supine views of the chest, abdomen, and pelvis.
Enteric tube terminates in the distal esophagus. Side hole is about
8 cm above the diaphragm.

Unchanged small-bowel obstruction gas pattern. No pneumoperitoneum
identified. Calcified uterine fibroid projects over the central
pelvis. Chronic severe left hip osteoarthritis. No acute osseous
abnormality identified.

Lung volumes and mediastinal contours are within normal limits aside
from tortuosity of the thoracic aorta. Visualized tracheal air
column is within normal limits. Allowing for portable technique the
lungs are clear. No pneumothorax.
IMPRESSION: 1. Enteric tube is in the esophagus. Advance 16 cm to ensure side
hole placement within the stomach.
2. Stable small-bowel obstruction.
3.  No acute cardiopulmonary abnormality.

## 2023-08-19 IMAGING — DX DG ABDOMEN 1V
1 series · 1 of 1 positions shown · non-contrast
Comparison: Study done earlier today

CLINICAL DATA: NG tube placement

EXAM:
ABDOMEN - 1 VIEW

[abdomen supine]
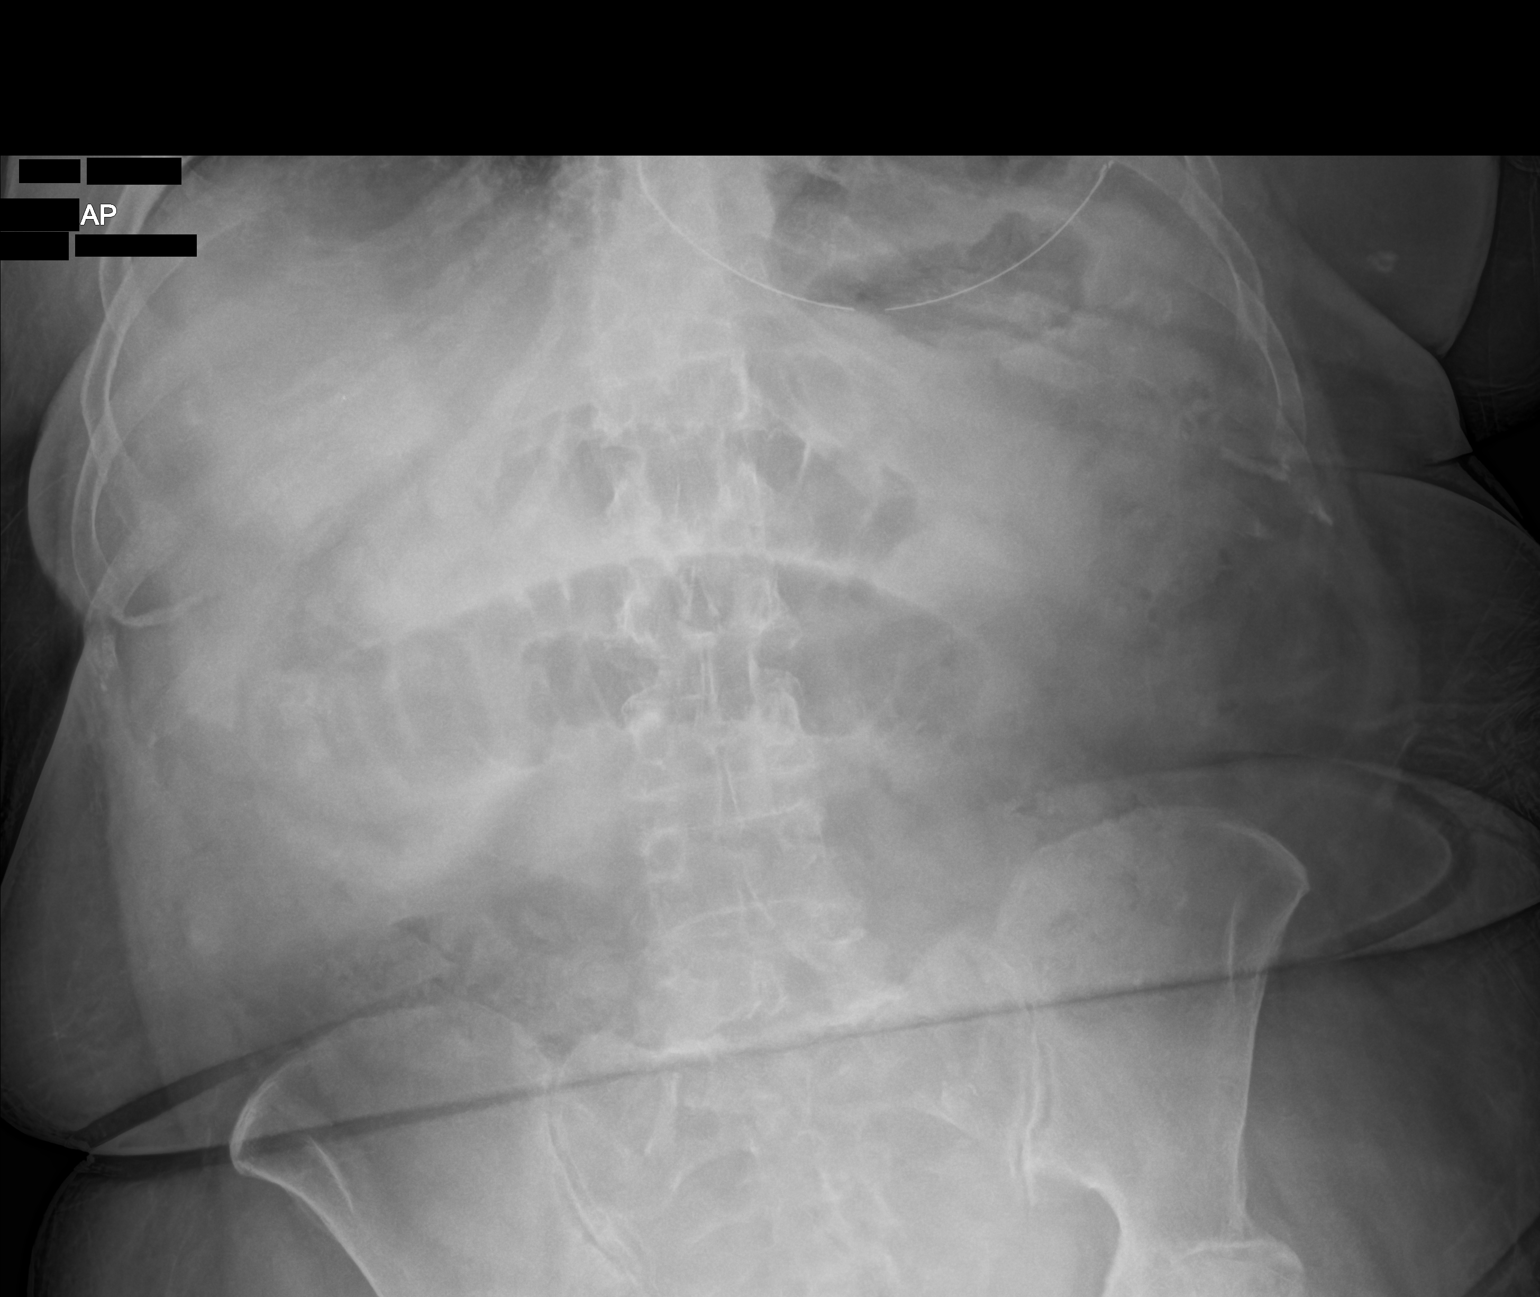

[1 of 1 positions shown; findings below may reference images not displayed]

FINDINGS: There is interval repositioning of enteric tube with its tip in the
lateral aspect of fundus of the stomach. Dilated small bowel loops
are noted measuring up to 4.8 cm in diameter.
IMPRESSION: Tip of enteric tube is noted within fundus of the stomach.

There is dilation of small-bowel loops suggesting small-bowel
obstruction.

## 2023-08-20 IMAGING — DX DG ABD PORTABLE 1V
2 series · 2 of 2 positions shown · non-contrast
Comparison: Abdominal x-ray from yesterday.

CLINICAL DATA: Small bowel obstruction.  8 hour delay.

EXAM:
PORTABLE ABDOMEN - 1 VIEW

[abdomen supine (1 of 2)]
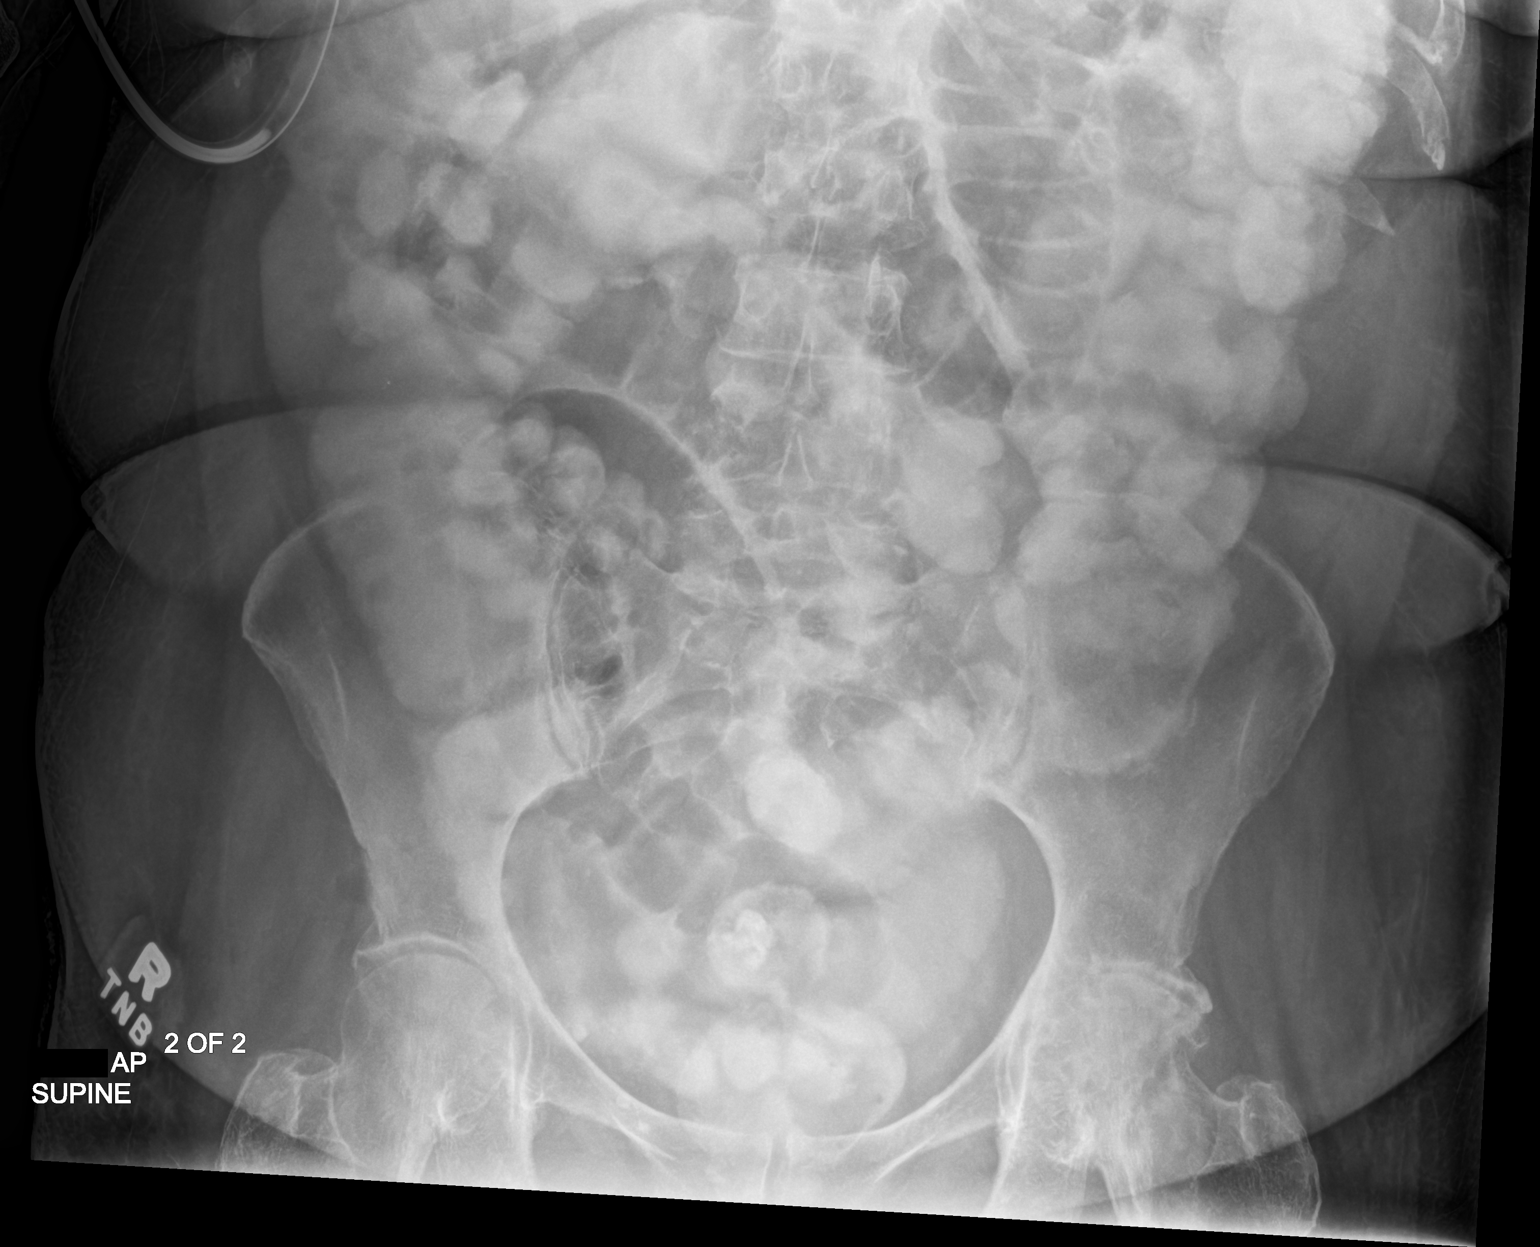

[abdomen supine (2 of 2)]
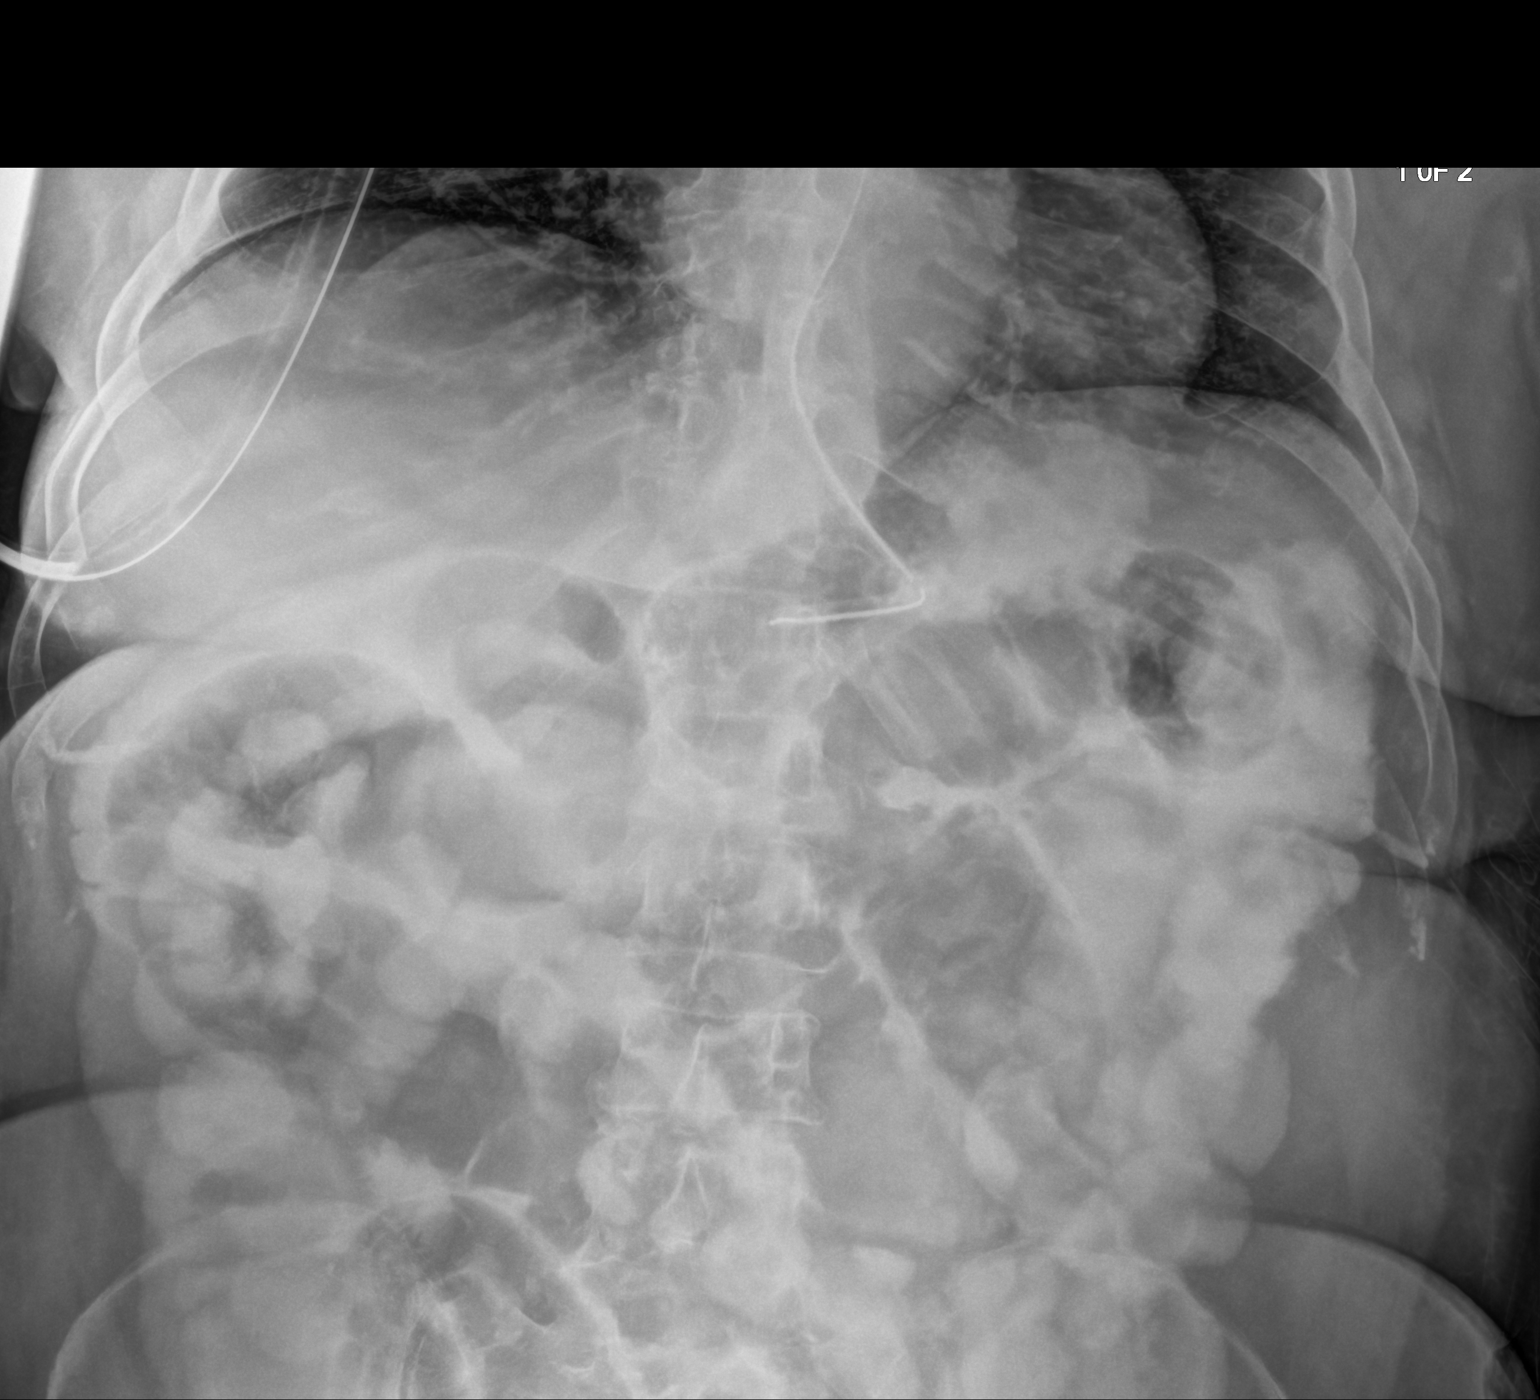

[2 of 2 positions shown; findings below may reference images not displayed]

FINDINGS: Unchanged enteric tube in the stomach. Oral contrast reaches the
rectum. Multiple dilated loops of air-filled small bowel in the
central abdomen and pelvis are unchanged.
IMPRESSION: 1. Unchanged small bowel dilatation suggestive of partial
obstruction, given oral contrast reaches the rectum.

## 2023-08-23 ENCOUNTER — Other Ambulatory Visit: Payer: Self-pay | Admitting: Orthopedic Surgery

## 2023-08-23 DIAGNOSIS — M170 Bilateral primary osteoarthritis of knee: Secondary | ICD-10-CM

## 2023-08-23 DIAGNOSIS — M51369 Other intervertebral disc degeneration, lumbar region without mention of lumbar back pain or lower extremity pain: Secondary | ICD-10-CM

## 2023-09-03 ENCOUNTER — Other Ambulatory Visit: Payer: Self-pay | Admitting: Family Medicine

## 2023-09-03 DIAGNOSIS — M51369 Other intervertebral disc degeneration, lumbar region without mention of lumbar back pain or lower extremity pain: Secondary | ICD-10-CM

## 2023-09-03 DIAGNOSIS — M170 Bilateral primary osteoarthritis of knee: Secondary | ICD-10-CM

## 2023-11-03 ENCOUNTER — Other Ambulatory Visit: Payer: Self-pay | Admitting: Family Medicine

## 2023-11-03 DIAGNOSIS — M170 Bilateral primary osteoarthritis of knee: Secondary | ICD-10-CM

## 2023-11-03 DIAGNOSIS — M51369 Other intervertebral disc degeneration, lumbar region without mention of lumbar back pain or lower extremity pain: Secondary | ICD-10-CM

## 2023-11-10 ENCOUNTER — Other Ambulatory Visit: Payer: Self-pay | Admitting: Orthopedic Surgery

## 2023-11-10 DIAGNOSIS — M51369 Other intervertebral disc degeneration, lumbar region without mention of lumbar back pain or lower extremity pain: Secondary | ICD-10-CM

## 2023-11-10 DIAGNOSIS — M170 Bilateral primary osteoarthritis of knee: Secondary | ICD-10-CM

## 2023-11-30 ENCOUNTER — Ambulatory Visit (INDEPENDENT_AMBULATORY_CARE_PROVIDER_SITE_OTHER): Admitting: Nurse Practitioner

## 2023-11-30 ENCOUNTER — Encounter: Payer: Self-pay | Admitting: Nurse Practitioner

## 2023-11-30 VITALS — BP 127/70 | HR 87 | Temp 98.1°F | Ht 67.0 in | Wt 181.8 lb

## 2023-11-30 DIAGNOSIS — R0981 Nasal congestion: Secondary | ICD-10-CM | POA: Insufficient documentation

## 2023-11-30 DIAGNOSIS — J309 Allergic rhinitis, unspecified: Secondary | ICD-10-CM | POA: Insufficient documentation

## 2023-11-30 DIAGNOSIS — R051 Acute cough: Secondary | ICD-10-CM | POA: Diagnosis not present

## 2023-11-30 LAB — VERITOR FLU A/B WAIVED
Influenza A: NEGATIVE
Influenza B: NEGATIVE

## 2023-11-30 MED ORDER — LEVOCETIRIZINE DIHYDROCHLORIDE 5 MG PO TABS
2.5000 mg | ORAL_TABLET | Freq: Every evening | ORAL | 0 refills | Status: DC
Start: 2023-11-30 — End: 2024-03-07

## 2023-11-30 MED ORDER — FLUTICASONE PROPIONATE 50 MCG/ACT NA SUSP: 2.0000 | Freq: Every day | NASAL | 6 refills | Status: AC

## 2023-11-30 NOTE — Progress Notes (Signed)
 Acute Office Visit  Subjective:     Patient ID: Tiffany Bradley, female    DOB: Jan 08, 1946, 78 y.o.   MRN: [id number]  Chief Complaint  Patient presents with   Nasal Congestion    Symptoms started Friday    Cough    HPI Tiffany Bradley is a 78 y.o. female 78 yrs old female presents 11/30/2023 fro an acute visit who complains of congestion, dry cough, itching in eyes, and watery eyes for 3 days. "Ttok at home COVID test on Friday was negative" She denies a history of chest pain, dizziness, fatigue, fevers, myalgias, nausea, vomiting, wheezing, and sputum production and denies a history of asthma. Patient denies smoke cigarettes. POC Flu A & B order  Active Ambulatory Problems    Diagnosis Date Noted   Essential hypertension 11/03/2016   Primary osteoarthritis of both knees 11/03/2016   GERD (gastroesophageal reflux disease) 11/03/2016   Age-related osteoporosis without current pathological fracture 11/03/2016   Gastritis, erosive    Iron deficiency anemia 04/28/2017   Vitamin D deficiency 04/28/2017   Status post total knee replacement, 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 02/11/2018   PUD (peptic ulcer disease) 05/09/2019   OAB (overactive bladder) 05/09/2019   DDD (degenerative disc disease), lumbar 05/09/2019   History of retinal detachment 03/12/2020   Pure hypercholesterolemia 03/12/2020   Small bowel obstruction (HCC) 12/13/2021   Status post left hip replacement 02/28/2022   Primary osteoarthritis of right knee 10/14/2022   S/P TKR (total knee replacement), 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 11/21/2022   Allergic rhinitis 11/30/2023   Nasal congestion 11/30/2023   Resolved Ambulatory Problems    Diagnosis Date Noted   Perforation of sigmoid colon (HCC) 06/29/2016   Colon cancer screening 11/03/2016   Abdominal pain, epigastric 12/12/2016   Osteopenia of lumbar spine 04/28/2017   Primary osteoarthritis of left knee 11/02/2017   Lumbar back pain 05/09/2019   Viral upper respiratory tract infection 03/05/2021    Unilateral primary osteoarthritis, left hip 12/12/2021   Hyperglycemia 12/13/2021   Unilateral primary osteoarthritis, right knee 10/14/2022   Past Medical History:  Diagnosis Date   Arthritis    Back pain    Cataract    DJD (degenerative joint disease)    Full dentures    Glaucoma    History of bleeding ulcers    History of total left hip replacement    Hypertension    Pre-diabetes    Wears glasses      Review of Systems  Constitutional:  Negative for chills and fever.  HENT:  Positive for congestion. Negative for sore throat.        Clear  Eyes:        Watery, itchy eye  Respiratory:  Positive for cough. Negative for shortness of breath and wheezing.        Clear  Cardiovascular:  Negative for chest pain and leg swelling.  Gastrointestinal:  Negative for nausea and vomiting.  Musculoskeletal:  Negative for myalgias.  Neurological:  Negative for dizziness and headaches.   Negative unless indicated in HPI    Objective:    BP 127/70   Pulse 87   Temp 98.1 F (36.7 C) (Temporal)   Ht 5\' 7"  (1.702 m)   Wt 181 lb 12.8 oz (82.5 kg)   SpO2 96%   BMI 28.47 kg/m  BP Readings from Last 3 Encounters:  11/30/23 127/70  02/26/23 137/86  12/29/22 132/79   Wt Readings from Last 3 Encounters:  11/30/23 181 lb 12.8 oz (82.5 kg)  05/19/23 175 lb (79.4 kg)  12/29/22 171 lb (77.6 kg)      Physical Exam Vitals and nursing note reviewed.  Constitutional:      General: She is not in acute distress. HENT:     Head: Normocephalic and atraumatic.     Right Ear: Tympanic membrane, ear canal and external ear normal. There is no impacted cerumen.     Left Ear: Tympanic membrane, ear canal and external ear normal. There is no impacted cerumen.     Nose: Congestion and rhinorrhea present.     Mouth/Throat:     Mouth: Mucous membranes are moist.  Eyes:     General: No scleral icterus.    Extraocular Movements: Extraocular movements intact.     Conjunctiva/sclera: Conjunctivae  normal.     Pupils: Pupils are equal, round, and reactive to light.  Cardiovascular:     Heart sounds: Normal heart sounds.  Pulmonary:     Effort: Pulmonary effort is normal.     Breath sounds: Normal breath sounds.  Abdominal:     General: Bowel sounds are normal.     Palpations: Abdomen is soft.  Musculoskeletal:        General: Normal range of motion.     Right lower leg: No edema.     Left lower leg: No edema.  Skin:    General: Skin is warm and dry.     Findings: No rash.  Neurological:     Mental Status: She is alert and oriented to person, place, and time.  Psychiatric:        Mood and Affect: Mood normal.        Behavior: Behavior normal.        Thought Content: Thought content normal.        Judgment: Judgment normal.    Influenza A& B negative No results found for any visits on 11/30/23.      Assessment & Plan:  Allergic rhinitis, unspecified seasonality, unspecified trigger -     Levocetirizine Dihydrochloride; Take 0.5 tablets (2.5 mg total) by mouth every evening.  Dispense: 45 tablet; Refill: 0 -     Fluticasone Propionate; Place 2 sprays into both nostrils daily.  Dispense: 16 g; Refill: 6  Acute cough -     Veritor Flu A/B Waived -     Novel Coronavirus, NAA ([facility]) -     Levocetirizine Dihydrochloride; Take 0.5 tablets (2.5 mg total) by mouth every evening.  Dispense: 45 tablet; Refill: 0 -     Fluticasone Propionate; Place 2 sprays into both nostrils daily.  Dispense: 16 g; Refill: 6  Nasal congestion -     Veritor Flu A/B Waived -     Novel Coronavirus, NAA ([facility]) -     Levocetirizine Dihydrochloride; Take 0.5 tablets (2.5 mg total) by mouth every evening.  Dispense: 45 tablet; Refill: 0 -     Fluticasone Propionate; Place 2 sprays into both nostrils daily.  Dispense: 16 g; Refill: 6  He was a 78 year old African-American female seen today for allergic rhinitis, no acute distress Allergic rhinitis: Xyzal 2.5 mg at bedtime, Flonase 2 spray  daily COVID: result pending All question answered, client instructed to call the clinic with any question or concern   The above assessment and management plan was discussed with the patient. The patient verbalized understanding of and has agreed to the management plan. Patient is aware to call the clinic if they develop any new symptoms or if symptoms persist or worsen. Patient  is aware when to return to the clinic for a follow-up visit. Patient educated on when it is appropriate to go to the emergency department.   Return if symptoms worsen or fail to improve.  Arrie Aran Santa Lighter, [facility] [address] [place], [place] [address] [phone]  Note: This document was prepared by [employer] voice dictation technology and any errors that results from this process are unintentional.

## 2023-12-02 LAB — NOVEL CORONAVIRUS, NAA: SARS-CoV-2, NAA: NOT DETECTED

## 2023-12-03 DIAGNOSIS — N189 Chronic kidney disease, unspecified: Secondary | ICD-10-CM | POA: Diagnosis not present

## 2023-12-03 DIAGNOSIS — J301 Allergic rhinitis due to pollen: Secondary | ICD-10-CM | POA: Diagnosis not present

## 2023-12-03 DIAGNOSIS — M1990 Unspecified osteoarthritis, unspecified site: Secondary | ICD-10-CM | POA: Diagnosis not present

## 2023-12-03 DIAGNOSIS — N3941 Urge incontinence: Secondary | ICD-10-CM | POA: Diagnosis not present

## 2023-12-03 DIAGNOSIS — K219 Gastro-esophageal reflux disease without esophagitis: Secondary | ICD-10-CM | POA: Diagnosis not present

## 2023-12-03 DIAGNOSIS — Z8249 Family history of ischemic heart disease and other diseases of the circulatory system: Secondary | ICD-10-CM | POA: Diagnosis not present

## 2023-12-03 DIAGNOSIS — I129 Hypertensive chronic kidney disease with stage 1 through stage 4 chronic kidney disease, or unspecified chronic kidney disease: Secondary | ICD-10-CM | POA: Diagnosis not present

## 2023-12-03 DIAGNOSIS — G629 Polyneuropathy, unspecified: Secondary | ICD-10-CM | POA: Diagnosis not present

## 2023-12-03 DIAGNOSIS — E785 Hyperlipidemia, unspecified: Secondary | ICD-10-CM | POA: Diagnosis not present

## 2023-12-03 DIAGNOSIS — M8580 Other specified disorders of bone density and structure, unspecified site: Secondary | ICD-10-CM | POA: Diagnosis not present

## 2023-12-03 DIAGNOSIS — Z87891 Personal history of nicotine dependence: Secondary | ICD-10-CM | POA: Diagnosis not present

## 2023-12-03 DIAGNOSIS — M62838 Other muscle spasm: Secondary | ICD-10-CM | POA: Diagnosis not present

## 2023-12-29 NOTE — Progress Notes (Unsigned)
 Tiffany Bradley is a 78 y.o. female presents to office today for annual physical exam examination.    Concerns today include: 1. ***  Occupation: ***, Marital status: ***, Substance use: *** Health Maintenance Due  Topic Date Due   Hepatitis C Screening  Never done   Zoster Vaccines- Shingrix (1 of 2) Never done   COVID-19 Vaccine (5 - 2024-25 season) 05/03/2023   DEXA SCAN  12/24/2023   Refills needed today: ***  Immunization History  Administered Date(s) Administered   Moderna Sars-Covid-2 Vaccination 10/06/2019, 11/04/2019, 06/23/2020, 12/13/2020   Past Medical History:  Diagnosis Date   Arthritis    Back pain    Cataract    DJD (degenerative joint disease)    Full dentures    GERD (gastroesophageal reflux disease)    Glaucoma    History of bleeding ulcers    patient reports multiple hospitalizations remotely   History of total left hip replacement    Hypertension    Perforation of sigmoid colon (HCC) 06/29/2016   Pre-diabetes    Unilateral primary osteoarthritis, left hip 12/12/2021   Unilateral primary osteoarthritis, right knee 10/14/2022   Wears glasses    Social History   Socioeconomic History   Marital status: Divorced    Spouse name: Not on file   Number of children: 3   Years of education: 11   Highest education level: Not on file  Occupational History   Occupation: Retired    Associate Professor: [employer]  Tobacco Use   Smoking status: Former    Current packs/day: 0.00    Types: Cigarettes    Quit date: 04/20/1993    Years since quitting: 30.7   Smokeless tobacco: Never  Vaping Use   Vaping status: Never Used  Substance and Sexual Activity   Alcohol use: Not Currently   Drug use: No   Sexual activity: Not Currently  Other Topics Concern   Not on file  Social History Narrative   Tiffany Bradley is retired and  her 11 year old granddaughter lives with her. She has two daughters and one son, all local that she sees regularly. She is active in church and sings  in the choir. She enjoys crafting, yard work, and shopping.    Social Drivers of Corporate investment banker Strain: Low Risk  (05/19/2023)   Overall Financial Resource Strain (CARDIA)    Difficulty of Paying Living Expenses: Not hard at all  Food Insecurity: No Food Insecurity (05/19/2023)   Hunger Vital Sign    Worried About Running Out of Food in the Last Year: Never true    Ran Out of Food in the Last Year: Never true  Transportation Needs: No Transportation Needs (05/19/2023)   PRAPARE - Administrator, Civil Service (Medical): No    Lack of Transportation (Non-Medical): No  Physical Activity: Insufficiently Active (05/19/2023)   Exercise Vital Sign    Days of Exercise per Week: 3 days    Minutes of Exercise per Session: 30 min  Stress: No Stress Concern Present (05/19/2023)   [employer] of Occupational Health - Occupational Stress Questionnaire    Feeling of Stress : Not at all  Social Connections: Moderately Integrated (05/19/2023)   Social Connection and Isolation Panel [NHANES]    Frequency of Communication with Friends and Family: More than three times a week    Frequency of Social Gatherings with Friends and Family: More than three times a week    Attends Religious Services: More than 4  times per year    Active Member of Clubs or Organizations: Yes    Attends Banker Meetings: More than 4 times per year    Marital Status: Divorced  Intimate Partner Violence: Not At Risk (05/19/2023)   Humiliation, Afraid, Rape, and Kick questionnaire    Fear of Current or Ex-Partner: No    Emotionally Abused: No    Physically Abused: No    Sexually Abused: No   Past Surgical History:  Procedure Laterality Date   BREAST REDUCTION SURGERY Bilateral 04/25/2013   Procedure: MAMMARY REDUCTION  (BREAST);  Surgeon: Phyllis Breeze, MD;  Location: [facility];  Service: Plastics;  Laterality: Bilateral;   COLONOSCOPY     2008, normal per patient    COLONOSCOPY N/A 01/16/2017   Procedure: COLONOSCOPY;  Surgeon: Alyce Jubilee, MD;  Location: [facility];  Service: Endoscopy;  Laterality: N/A;  830    COSMETIC SURGERY     ESOPHAGOGASTRODUODENOSCOPY N/A 01/16/2017   Procedure: ESOPHAGOGASTRODUODENOSCOPY (EGD);  Surgeon: Alyce Jubilee, MD;  Location: [facility];  Service: Endoscopy;  Laterality: N/A;   PARTIAL COLECTOMY N/A 06/29/2016   Procedure: PARTIAL COLECTOMY;  Surgeon: Franki Isles, MD;  Location: [facility];  Service: General;  Laterality: N/A;   REDUCTION MAMMAPLASTY     TOTAL HIP ARTHROPLASTY Left 02/28/2022   Procedure: LEFT TOTAL HIP ARTHROPLASTY ANTERIOR APPROACH;  Surgeon: Arnie Lao, MD;  Location: [facility];  Service: Orthopedics;  Laterality: Left;   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   Procedure: TOTAL KNEE ARTHROPLASTY;  Surgeon: Darrin Emerald, MD;  Location: [facility];  Service: Orthopedics;  Laterality: Left;  DePuy    TOTAL KNEE ARTHROPLASTY Right 10/14/2022   Procedure: TOTAL KNEE ARTHROPLASTY;  Surgeon: Darrin Emerald, MD;  Location: [facility];  Service: Orthopedics;  Laterality: Right;   TUBAL LIGATION     Family History  Problem Relation Age of Onset   Kidney disease Mother    Stroke Mother    Heart attack Father    Hyperlipidemia Sister    Hypertension Sister    Diabetes Sister    Hypertension Sister    Hyperlipidemia Sister    Hypertension Brother    Hyperlipidemia Brother    Colon cancer Neg Hx    Breast cancer Neg Hx     Current Outpatient Medications:    amLODipine  (NORVASC ) 10 MG tablet, Take 1 tablet (10 mg total) by mouth daily., Disp: 90 tablet, Rfl: 3   aspirin  325 MG tablet, Take 1 tablet (325 mg total) by mouth daily., Disp: 30 tablet, Rfl: 0   atorvastatin  (LIPITOR) 20 MG tablet, Take 1 tablet (20 mg total) by mouth daily., Disp: 90 tablet, Rfl: 3   Biotin  10 MG CAPS, Take 10 mg of amoxicillin by mouth daily., Disp: , Rfl:    Calcium  Carb-Cholecalciferol (CALCIUM   600+D3 PO), Take 1 tablet by mouth daily., Disp: , Rfl:    Cholecalciferol (VITAMIN D ) 50 MCG (2000 UT) CAPS, Take 2,000 Units by mouth daily., Disp: , Rfl:    ferrous sulfate  325 (65 FE) MG tablet, Take 325 mg by mouth daily., Disp: , Rfl:    fluticasone  (FLONASE ) 50 MCG/ACT nasal spray, Place 2 sprays into both nostrils daily., Disp: 16 g, Rfl: 6   gabapentin  (NEURONTIN ) 300 MG capsule, Take 1 capsule (300 mg total) by mouth 2 (two) times daily., Disp: 180 capsule, Rfl: 3   Glucosamine HCl-MSM (GLUCOSAMINE-MSM PO), Take 2 tablets by mouth daily., Disp: ,  Rfl:    levocetirizine (XYZAL ) 5 MG tablet, Take 0.5 tablets (2.5 mg total) by mouth every evening., Disp: 45 tablet, Rfl: 0   mometasone  (NASONEX ) 50 MCG/ACT nasal spray, Use 2 spray(s) in each nostril once daily, Disp: 17 g, Rfl: 3   Multiple Vitamins-Minerals (MULTIVITAMIN WITH MINERALS) tablet, Take 1 tablet by mouth daily. With Zinc, Disp: , Rfl:    omeprazole  (PRILOSEC) 40 MG capsule, Take 1 capsule (40 mg total) by mouth daily., Disp: 90 capsule, Rfl: 3   polyethylene glycol (MIRALAX ) 17 g packet, Take 17 g by mouth daily., Disp: 28 each, Rfl: 2   tiZANidine  (ZANAFLEX ) 4 MG tablet, TAKE 1 TABLET BY MOUTH EVERY 8 HOURS AS NEEDED FOR MUSCLE SPASM, Disp: 60 tablet, Rfl: 0   tolterodine  (DETROL  LA) 2 MG 24 hr capsule, Take 1 capsule (2 mg total) by mouth daily., Disp: 90 capsule, Rfl: 3   torsemide  (DEMADEX ) 20 MG tablet, Take 20 mg by mouth daily., Disp: , Rfl:    traMADol  (ULTRAM ) 50 MG tablet, Take 1 tablet (50 mg total) by mouth every 6 (six) hours., Disp: 30 tablet, Rfl: 0  No Known Allergies   ROS: Review of Systems {ros; complete:30496}    Physical exam {Exam, [phone]}      05/19/2023    1:04 PM 12/29/2022    9:09 AM 06/27/2022    9:43 AM  Depression screen PHQ 2/9  Decreased Interest 0 0 0  Down, Depressed, Hopeless 0 0 0  PHQ - 2 Score 0 0 0  Altered sleeping  0 0  Tired, decreased energy  0 0  Change in  appetite  0 0  Feeling bad or failure about yourself   0 0  Trouble concentrating  0 0  Moving slowly or fidgety/restless  0 0  Suicidal thoughts  0 0  PHQ-9 Score  0 0  Difficult doing work/chores  Not difficult at all Not difficult at all      12/29/2022    9:09 AM 06/27/2022    9:43 AM 12/25/2021    3:07 PM 11/13/2021    9:19 AM  GAD 7 : Generalized Anxiety Score  Nervous, Anxious, on Edge 0 0 0 0  Control/stop worrying 0 0 0 0  Worry too much - different things 0 0 0 0  Trouble relaxing 0 0 0 0  Restless 0 0 0 0  Easily annoyed or irritable 0 0 0 0  Afraid - awful might happen 0 0 0 0  Total GAD 7 Score 0 0 0 0  Anxiety Difficulty Not difficult at all Not difficult at all Not difficult at all Not difficult at all     Assessment/ Plan: Tiffany Bradley here for annual physical exam.   Annual physical exam  Essential hypertension  Pure hypercholesterolemia  Age-related osteoporosis without current pathological fracture  Iron deficiency anemia, unspecified iron deficiency anemia type  ***  Counseled on healthy lifestyle choices, including diet (rich in fruits, vegetables and lean meats and low in salt and simple carbohydrates) and exercise (at least 30 minutes of moderate physical activity daily).  Patient to follow up ***  Thailan Sava M. Bonnell Butcher, DO

## 2023-12-30 ENCOUNTER — Ambulatory Visit (INDEPENDENT_AMBULATORY_CARE_PROVIDER_SITE_OTHER): Payer: Medicare HMO | Admitting: Family Medicine

## 2023-12-30 ENCOUNTER — Encounter: Payer: Self-pay | Admitting: Family Medicine

## 2023-12-30 VITALS — BP 130/78 | HR 79 | Temp 98.5°F | Ht 67.0 in | Wt 182.0 lb

## 2023-12-30 DIAGNOSIS — Z96652 Presence of left artificial knee joint: Secondary | ICD-10-CM | POA: Diagnosis not present

## 2023-12-30 DIAGNOSIS — M810 Age-related osteoporosis without current pathological fracture: Secondary | ICD-10-CM | POA: Diagnosis not present

## 2023-12-30 DIAGNOSIS — Z1159 Encounter for screening for other viral diseases: Secondary | ICD-10-CM | POA: Diagnosis not present

## 2023-12-30 DIAGNOSIS — I10 Essential (primary) hypertension: Secondary | ICD-10-CM

## 2023-12-30 DIAGNOSIS — Z0001 Encounter for general adult medical examination with abnormal findings: Secondary | ICD-10-CM

## 2023-12-30 DIAGNOSIS — E7800 Pure hypercholesterolemia, unspecified: Secondary | ICD-10-CM

## 2023-12-30 DIAGNOSIS — D509 Iron deficiency anemia, unspecified: Secondary | ICD-10-CM

## 2023-12-30 DIAGNOSIS — Z0000 Encounter for general adult medical examination without abnormal findings: Secondary | ICD-10-CM

## 2023-12-30 MED ORDER — GABAPENTIN 300 MG PO CAPS: 300.0000 mg | ORAL_CAPSULE | Freq: Two times a day (BID) | ORAL | 3 refills | Status: DC

## 2023-12-30 MED ORDER — OMEPRAZOLE 40 MG PO CPDR: 40.0000 mg | DELAYED_RELEASE_CAPSULE | Freq: Every day | ORAL | 3 refills | Status: AC

## 2023-12-30 MED ORDER — AMLODIPINE BESYLATE 10 MG PO TABS: 10.0000 mg | ORAL_TABLET | Freq: Every day | ORAL | 3 refills | Status: AC

## 2023-12-30 MED ORDER — ATORVASTATIN CALCIUM 20 MG PO TABS: 20.0000 mg | ORAL_TABLET | Freq: Every day | ORAL | 3 refills | Status: AC

## 2023-12-31 ENCOUNTER — Telehealth: Payer: Self-pay

## 2023-12-31 ENCOUNTER — Ambulatory Visit (INDEPENDENT_AMBULATORY_CARE_PROVIDER_SITE_OTHER)

## 2023-12-31 DIAGNOSIS — M810 Age-related osteoporosis without current pathological fracture: Secondary | ICD-10-CM | POA: Diagnosis not present

## 2023-12-31 LAB — CBC
Hematocrit: 28.3 % — ABNORMAL LOW (ref 34.0–46.6)
Hemoglobin: 8.5 g/dL — CL (ref 11.1–15.9)
MCH: 22.3 pg — ABNORMAL LOW (ref 26.6–33.0)
MCHC: 30 g/dL — ABNORMAL LOW (ref 31.5–35.7)
MCV: 74 fL — ABNORMAL LOW (ref 79–97)
Platelets: 271 10*3/uL (ref 150–450)
RBC: 3.81 x10E6/uL (ref 3.77–5.28)
RDW: 18.8 % — ABNORMAL HIGH (ref 11.7–15.4)
WBC: 3.8 10*3/uL (ref 3.4–10.8)

## 2023-12-31 LAB — IRON,TIBC AND FERRITIN PANEL
Ferritin: 20 ng/mL (ref 15–150)
Iron Saturation: 7 % — CL (ref 15–55)
Iron: 30 ug/dL (ref 27–139)
Total Iron Binding Capacity: 453 ug/dL — ABNORMAL HIGH (ref 250–450)
UIBC: 423 ug/dL — ABNORMAL HIGH (ref 118–369)

## 2023-12-31 LAB — CMP14+EGFR
ALT: 17 IU/L (ref 0–32)
AST: 34 IU/L (ref 0–40)
Albumin: 4.4 g/dL (ref 3.8–4.8)
Alkaline Phosphatase: 113 IU/L (ref 44–121)
BUN/Creatinine Ratio: 15 (ref 12–28)
BUN: 10 mg/dL (ref 8–27)
Bilirubin Total: 0.6 mg/dL (ref 0.0–1.2)
CO2: 22 mmol/L (ref 20–29)
Calcium: 9.6 mg/dL (ref 8.7–10.3)
Chloride: 104 mmol/L (ref 96–106)
Creatinine, Ser: 0.68 mg/dL (ref 0.57–1.00)
Globulin, Total: 2.6 g/dL (ref 1.5–4.5)
Glucose: 94 mg/dL (ref 70–99)
Potassium: 4.1 mmol/L (ref 3.5–5.2)
Sodium: 144 mmol/L (ref 134–144)
Total Protein: 7 g/dL (ref 6.0–8.5)
eGFR: 90 mL/min/{1.73_m2} (ref >59)

## 2023-12-31 LAB — TSH: TSH: 1.89 u[IU]/mL (ref 0.450–4.500)

## 2023-12-31 LAB — HEPATITIS C ANTIBODY

## 2023-12-31 LAB — VITAMIN D 25 HYDROXY (VIT D DEFICIENCY, FRACTURES): Vit D, 25-Hydroxy: 51.1 ng/mL (ref 30.0–100.0)

## 2023-12-31 NOTE — Telephone Encounter (Signed)
 Telephone encounter created and sent to covering provider.

## 2023-12-31 NOTE — Telephone Encounter (Signed)
 Copied from CRM [id number]. Topic: Clinical - Lab/Test Results >> Dec 31, 2023 10:29 AM Retta Caster wrote: Reason for CRM: Patients from [facility] calling with Critical Lab Results. Called office they stated Nurse was talking to patient already with this. [facility] needs call back on name of nurse to close this out. Needed ASAP [phone] opt 1

## 2023-12-31 NOTE — Telephone Encounter (Signed)
 Made several attempts to reach [facility] Hgb 8.5 Tiffany Bradley gave results - I spoke with [name] and advised  her of Hemoglobin results.

## 2023-12-31 NOTE — Telephone Encounter (Signed)
 Copied from CRM [id number]. Topic: Clinical - Lab/Test Results >> Dec 31, 2023  1:49 PM Arlie Benedict B wrote: Reason for CRM: Mariah Shines from [facility] [phone] called stating she has a critical lab. Tried calling CAL and it stated was unable to reach and a busy signal sign came up on [employer] will try calling CAL and giving the phone number of the contact from [facility]

## 2024-01-05 ENCOUNTER — Encounter: Payer: Self-pay | Admitting: Family Medicine

## 2024-01-12 ENCOUNTER — Ambulatory Visit: Payer: Self-pay

## 2024-01-14 NOTE — Telephone Encounter (Signed)
 fyi

## 2024-01-25 ENCOUNTER — Other Ambulatory Visit: Payer: Self-pay | Admitting: Orthopedic Surgery

## 2024-01-25 DIAGNOSIS — M170 Bilateral primary osteoarthritis of knee: Secondary | ICD-10-CM

## 2024-01-25 DIAGNOSIS — M51369 Other intervertebral disc degeneration, lumbar region without mention of lumbar back pain or lower extremity pain: Secondary | ICD-10-CM

## 2024-02-25 ENCOUNTER — Other Ambulatory Visit: Payer: Self-pay | Admitting: Family Medicine

## 2024-02-25 DIAGNOSIS — Z1231 Encounter for screening mammogram for malignant neoplasm of breast: Secondary | ICD-10-CM

## 2024-03-05 ENCOUNTER — Other Ambulatory Visit: Payer: Self-pay | Admitting: Orthopedic Surgery

## 2024-03-05 ENCOUNTER — Other Ambulatory Visit: Payer: Self-pay | Admitting: Nurse Practitioner

## 2024-03-05 ENCOUNTER — Other Ambulatory Visit: Payer: Self-pay | Admitting: Family Medicine

## 2024-03-05 DIAGNOSIS — J309 Allergic rhinitis, unspecified: Secondary | ICD-10-CM

## 2024-03-05 DIAGNOSIS — R051 Acute cough: Secondary | ICD-10-CM

## 2024-03-05 DIAGNOSIS — R0981 Nasal congestion: Secondary | ICD-10-CM

## 2024-03-05 DIAGNOSIS — M51369 Other intervertebral disc degeneration, lumbar region without mention of lumbar back pain or lower extremity pain: Secondary | ICD-10-CM

## 2024-03-05 DIAGNOSIS — M170 Bilateral primary osteoarthritis of knee: Secondary | ICD-10-CM

## 2024-03-05 DIAGNOSIS — N3281 Overactive bladder: Secondary | ICD-10-CM

## 2024-03-14 ENCOUNTER — Ambulatory Visit
Admission: RE | Admit: 2024-03-14 | Discharge: 2024-03-14 | Disposition: A | Discharge status: Home / Self Care | Source: Ambulatory Visit | Attending: Family Medicine | Admitting: Family Medicine

## 2024-03-14 DIAGNOSIS — Z1231 Encounter for screening mammogram for malignant neoplasm of breast: Secondary | ICD-10-CM

## 2024-05-19 ENCOUNTER — Ambulatory Visit (INDEPENDENT_AMBULATORY_CARE_PROVIDER_SITE_OTHER): Payer: Medicare HMO

## 2024-05-19 VITALS — BP 130/78 | HR 79 | Ht 67.0 in | Wt 182.0 lb

## 2024-05-19 DIAGNOSIS — Z0000 Encounter for general adult medical examination without abnormal findings: Secondary | ICD-10-CM | POA: Diagnosis not present

## 2024-05-19 NOTE — Patient Instructions (Signed)
 Tiffany Bradley,  Thank you for taking the time for your Medicare Wellness Visit. I appreciate your continued commitment to your health goals. Please review the care plan we discussed, and feel free to reach out if I can assist you further.  Medicare recommends these wellness visits once per year to help you and your care team stay ahead of potential health issues. These visits are designed to focus on prevention, allowing your provider to concentrate on managing your acute and chronic conditions during your regular appointments.  Please note that Annual Wellness Visits do not include a physical exam. Some assessments may be limited, especially if the visit was conducted virtually. If needed, we may recommend a separate in-person follow-up with your provider.  Ongoing Care Seeing your primary care provider every 3 to 6 months helps us  monitor your health and provide consistent, personalized care.   Referrals If a referral was made during today's visit and you haven't received any updates within two weeks, please contact the referred provider directly to check on the status.  Recommended Screenings:  Health Maintenance  Topic Date Due   Flu Shot  Never done   COVID-19 Vaccine (5 - 2025-26 season) 05/02/2024   Medicare Annual Wellness Visit  05/18/2024   DTaP/Tdap/Td vaccine (1 - Tdap) 12/29/2024*   Breast Cancer Screening  03/14/2025   DEXA scan (bone density measurement)  12/30/2025   Colon Cancer Screening  01/17/2027   Hepatitis C Screening  Completed   HPV Vaccine  Aged Out   Meningitis B Vaccine  Aged Out   Pneumococcal Vaccine for age over 27  Discontinued   Zoster (Shingles) Vaccine  Discontinued  *Topic was postponed. The date shown is not the original due date.       05/19/2024   10:45 AM  Advanced Directives  Does Patient Have a Medical Advance Directive? No   Advance Care Planning is important because it: Ensures you receive medical care that aligns with your values, goals,  and preferences. Provides guidance to your family and loved ones, reducing the emotional burden of decision-making during critical moments.  Vision: Annual vision screenings are recommended for early detection of glaucoma, cataracts, and diabetic retinopathy. These exams can also reveal signs of chronic conditions such as diabetes and high blood pressure.  Dental: Annual dental screenings help detect early signs of oral cancer, gum disease, and other conditions linked to overall health, including heart disease and diabetes.  Please see the attached documents for additional preventive care recommendations.

## 2024-05-19 NOTE — Progress Notes (Signed)
 Subjective:   Tiffany Bradley is a 78 y.o. who presents for a Medicare Wellness preventive visit.  As a reminder, Annual Wellness Visits don't include a physical exam, and some assessments may be limited, especially if this visit is performed virtually. We may recommend an in-person follow-up visit with your provider if needed.  Visit Complete: Virtual I connected with  Tiffany Bradley on 05/19/24 by a audio enabled telemedicine application and verified that I am speaking with the correct person using two identifiers.  Patient Location: Home  Provider Location: Home Office  I discussed the limitations of evaluation and management by telemedicine. The patient expressed understanding and agreed to proceed.  Vital Signs: Because this visit was a virtual/telehealth visit, some criteria may be missing or patient reported. Any vitals not documented were not able to be obtained and vitals that have been documented are patient reported.  VideoDeclined- This patient declined Librarian, academic. Therefore the visit was completed with audio only.  Persons Participating in Visit: Patient.  AWV Questionnaire: No: Patient Medicare AWV questionnaire was not completed prior to this visit.  Cardiac Risk Factors include: advanced age (>63men, >29 women);hypertension     Objective:    Today's Vitals   05/19/24 1038 05/19/24 1040  BP: 130/78   Pulse: 79   Weight: 182 lb (82.6 kg)   Height: 5' 7 (1.702 m)   PainSc:  6    Body mass index is 28.51 kg/m.     05/19/2024   10:45 AM 05/19/2023    1:05 PM 10/18/2022   11:16 AM 10/14/2022    8:44 AM 10/10/2022   12:04 PM 05/19/2022    8:32 AM 02/28/2022    4:35 PM  Advanced Directives  Does Patient Have a Medical Advance Directive? No No No No No No No  Would patient like information on creating a medical advance directive?  Yes (MAU/Ambulatory/Procedural Areas - Information given)  No - Patient declined No - Patient declined No -  Patient declined No - Patient declined    Current Medications (verified) Outpatient Encounter Medications as of 05/19/2024  Medication Sig   amLODipine  (NORVASC ) 10 MG tablet Take 1 tablet (10 mg total) by mouth daily.   aspirin  325 MG tablet Take 1 tablet (325 mg total) by mouth daily.   atorvastatin  (LIPITOR) 20 MG tablet Take 1 tablet (20 mg total) by mouth daily.   Biotin  10 MG CAPS Take 10 mg of amoxicillin by mouth daily.   Calcium  Carb-Cholecalciferol (CALCIUM  600+D3 PO) Take 1 tablet by mouth daily.   Cholecalciferol (VITAMIN D ) 50 MCG (2000 UT) CAPS Take 2,000 Units by mouth daily.   ferrous sulfate  325 (65 FE) MG tablet Take 325 mg by mouth daily.   fluticasone  (FLONASE ) 50 MCG/ACT nasal spray Place 2 sprays into both nostrils daily.   Glucosamine HCl-MSM (GLUCOSAMINE-MSM PO) Take 2 tablets by mouth daily.   levocetirizine (XYZAL ) 5 MG tablet TAKE 1/2 (ONE-HALF) TABLET BY MOUTH IN THE EVENING   Multiple Vitamins-Minerals (MULTIVITAMIN WITH MINERALS) tablet Take 1 tablet by mouth daily. With Zinc   omeprazole  (PRILOSEC) 40 MG capsule Take 1 capsule (40 mg total) by mouth daily.   polyethylene glycol (MIRALAX ) 17 g packet Take 17 g by mouth daily.   tiZANidine  (ZANAFLEX ) 4 MG tablet TAKE 1 TABLET BY MOUTH EVERY 8 HOURS AS NEEDED FOR MUSCLE SPASM   tolterodine  (DETROL  LA) 2 MG 24 hr capsule Take 1 capsule by mouth once daily   torsemide  (DEMADEX )  20 MG tablet Take 20 mg by mouth daily.   No facility-administered encounter medications on file as of 05/19/2024.    Allergies (verified) Patient has no known allergies.   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   Unilateral primary osteoarthritis, right knee 10/14/2022   Wears glasses    Past Surgical History:  Procedure Laterality Date   BREAST REDUCTION SURGERY Bilateral 04/25/2013   Procedure: MAMMARY REDUCTION  (BREAST);  Surgeon: Elna Pick, MD;  Location: [facility];  Service: Plastics;  Laterality: Bilateral;   COLONOSCOPY     2008, normal per patient   COLONOSCOPY N/A 01/16/2017   Procedure: COLONOSCOPY;  Surgeon: Harvey Margo CROME, MD;  [facility];  Service: Endoscopy;  Laterality: N/A;  830    COSMETIC SURGERY     ESOPHAGOGASTRODUODENOSCOPY N/A 01/16/2017   Procedure: ESOPHAGOGASTRODUODENOSCOPY (EGD);  Surgeon: Harvey Margo CROME, MD;  Location: [facility];  Service: Endoscopy;  Laterality: N/A;   PARTIAL COLECTOMY N/A 06/29/2016   Procedure: PARTIAL COLECTOMY;  Surgeon: Selinda Artist Moats, MD;  Location: [facility];  Service: General;  Laterality: N/A;   REDUCTION MAMMAPLASTY     TOTAL HIP ARTHROPLASTY Left 02/28/2022   Procedure: LEFT TOTAL HIP ARTHROPLASTY ANTERIOR APPROACH;  Surgeon: Vernetta Lonni GRADE, MD;  Location: [facility];  Service: Orthopedics;  Laterality: Left;   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   Procedure: TOTAL KNEE ARTHROPLASTY;  Surgeon: Margrette Taft BRAVO, MD;  Location: [facility];  Service: Orthopedics;  Laterality: Left;  DePuy    TOTAL KNEE ARTHROPLASTY Right 10/14/2022   Procedure: TOTAL KNEE ARTHROPLASTY;  Surgeon: Margrette Taft BRAVO, MD;  Location: [facility];  Service: Orthopedics;  Laterality: Right;   TUBAL LIGATION     Family History  Problem Relation Age of Onset   Kidney disease Mother    Stroke Mother    Heart attack Father    Hyperlipidemia Sister    Hypertension Sister    Diabetes Sister    Hypertension Sister    Hyperlipidemia Sister    Hypertension Brother    Hyperlipidemia Brother    Diabetes Son    Colon cancer  Neg Hx    Breast cancer Neg Hx    Social History   Socioeconomic History   Marital status: Divorced    Spouse name: Not on file   Number of children: 3   Years of education: 11   Highest education level: Not on file  Occupational History   Occupation: Retired    Associate Professor: [employer]  Tobacco Use   Smoking status: Former    Current packs/day: 0.00    Types: Cigarettes    Quit date: 04/20/1993    Years since quitting: 31.1   Smokeless tobacco: Never  Vaping Use   Vaping status: Never Used  Substance and Sexual Activity   Alcohol use: Not Currently   Drug use: No   Sexual activity: Not Currently  Other Topics Concern   Not on file  Social History Narrative  Atlas is retired and  her 90 year old granddaughter lives with her. She has two daughters and one son, all local that she sees regularly. She is active in church and sings in the choir. She enjoys crafting, yard work, and shopping.    Social Drivers of Corporate investment banker Strain: Low Risk  (05/19/2024)   Overall Financial Resource Strain (CARDIA)    Difficulty of Paying Living Expenses: Not very hard  Food Insecurity: No Food Insecurity (05/19/2024)   Hunger Vital Sign    Worried About Running Out of Food in the Last Year: Never true    Ran Out of Food in the Last Year: Never true  Transportation Needs: No Transportation Needs (05/19/2024)   PRAPARE - Administrator, Civil Service (Medical): No    Lack of Transportation (Non-Medical): No  Physical Activity: Insufficiently Active (05/19/2024)   Exercise Vital Sign    Days of Exercise per Week: 3 days    Minutes of Exercise per Session: 30 min  Stress: No Stress Concern Present (05/19/2024)   [employer] of Occupational Health - Occupational Stress Questionnaire    Feeling of Stress: Not at all  Social Connections: Moderately Isolated (05/19/2024)   Social Connection and Isolation Panel    Frequency of Communication with Friends and Family:  More than three times a week    Frequency of Social Gatherings with Friends and Family: More than three times a week    Attends Religious Services: More than 4 times per year    Active Member of [employer] or Organizations: No    Attends Engineer, structural: Never    Marital Status: Divorced    Tobacco Counseling Counseling given: Yes    Clinical Intake:  Pre-visit preparation completed: Yes  Pain : 0-10 Pain Score: 6  Pain Type: Chronic pain Pain Location: Back Pain Orientation: Lower Pain Descriptors / Indicators: Constant Pain Onset: Other (comment) Pain Frequency: Constant Pain Relieving Factors: tylenol   Pain Relieving Factors: tylenol   BMI - recorded: 28.51 Nutritional Status: BMI 25 -29 Overweight Nutritional Risks: None Diabetes: No  Lab Results  Component Value Date   HGBA1C 5.3 10/10/2022   HGBA1C 5.3 02/18/2022   HGBA1C 5.8 (H) 12/14/2021     How often do you need to have someone help you when you read instructions, pamphlets, or other written materials from your doctor or pharmacy?: 1 - Never  Interpreter Needed?: No  Information entered by :: alia t/cma   Activities of Daily Living     05/19/2024   10:43 AM  In your present state of health, do you have any difficulty performing the following activities:  Hearing? 0  Vision? 0  Difficulty concentrating or making decisions? 0  Walking or climbing stairs? 0  Dressing or bathing? 0  Doing errands, shopping? 0  Preparing Food and eating ? N  Using the Toilet? N  In the past six months, have you accidently leaked urine? Y  Do you have problems with loss of bowel control? N  Managing your Medications? N  Managing your Finances? N  Housekeeping or managing your Housekeeping? N    Patient Care Team: Jolinda Norene HERO, DO as PCP - General (Family Medicine) Harvey Margo CROME, MD (Inactive) as Consulting Physician (Gastroenterology) Margrette Taft BRAVO, MD as Consulting Physician (Orthopedic  Surgery) Vicci Mcardle, OD (Optometry)  I have updated your Care Teams any recent Medical Services you may have received from other providers in the past year.  Assessment:   This is a routine wellness examination for Tiffany Bradley.  Hearing/Vision screen Hearing Screening - Comments:: Pt denies hearing dif Vision Screening - Comments:: Pt wear glasses/pt goes to [facility] [address] in [place]/last ov yr ago   Goals Addressed             This Visit's Progress    Patient Stated   On track    11/09/2019 AWV Goal: Exercise for General Health  Patient will verbalize understanding of the benefits of increased physical activity: Exercising regularly is important. It will improve your overall fitness, flexibility, and endurance. Regular exercise also will improve your overall health. It can help you control your weight, reduce stress, and improve your bone density. Over the next year, patient will increase physical activity as tolerated with a goal of at least 150 minutes of moderate physical activity per week.  You can tell that you are exercising at a moderate intensity if your heart starts beating faster and you start breathing faster but can still hold a conversation. Moderate-intensity exercise ideas include: Walking 1 mile (1.6 km) in about 15 minutes Biking Hiking Golfing Dancing Water  aerobics Patient will verbalize understanding of everyday activities that increase physical activity by providing examples like the following: Yard work, such as: Insurance underwriter Gardening Washing windows or floors Patient will be able to explain general safety guidelines for exercising:  Before you start a new exercise program, talk with your health care provider. Do not exercise so much that you hurt yourself, feel dizzy, or get very short of breath. Wear comfortable clothes and wear shoes with good support. Drink  plenty of water  while you exercise to prevent dehydration or heat stroke. Work out until your breathing and your heartbeat get faster.      Patient Stated   On track    05/19/2022 AWV Goal: Fall Prevention  Over the next year, patient will decrease their risk for falls by: Using assistive devices, such as a cane or walker, as needed Identifying fall risks within their home and correcting them by: Removing throw rugs Adding handrails to stairs or ramps Removing clutter and keeping a clear pathway throughout the home Increasing light, especially at night Adding shower handles/bars Raising toilet seat Identifying potential personal risk factors for falls: Medication side effects Incontinence/urgency Vestibular dysfunction Hearing loss Musculoskeletal disorders Neurological disorders Orthostatic hypotension         Depression Screen     05/19/2024   10:48 AM 12/30/2023    9:25 AM 05/19/2023    1:04 PM 12/29/2022    9:09 AM 06/27/2022    9:43 AM 05/19/2022    8:39 AM 12/25/2021    3:07 PM  PHQ 2/9 Scores  PHQ - 2 Score 0 0 0 0 0 0 0  PHQ- 9 Score  0  0 0  0    Fall Risk     05/19/2024   10:40 AM 12/30/2023    9:25 AM 05/19/2023    1:02 PM 06/27/2022    9:43 AM 05/19/2022    8:39 AM  Fall Risk   Falls in the past year? 0 0 0 0 0  Number falls in past yr: 0 0 0    Injury with Fall? 0 0 0    Risk for fall due to : No Fall Risks No Fall Risks No Fall Risks    Follow up Falls evaluation completed Falls evaluation completed Falls  prevention discussed  Falls evaluation completed      Data saved with a previous flowsheet row definition    MEDICARE RISK AT HOME:  Medicare Risk at Home Any stairs in or around the home?: Yes If so, are there any without handrails?: Yes Home free of loose throw rugs in walkways, pet beds, electrical cords, etc?: Yes Adequate lighting in your home to reduce risk of falls?: Yes Life alert?: No Use of a cane, walker or w/c?: No Grab bars in the  bathroom?: Yes Shower chair or bench in shower?: Yes Elevated toilet seat or a handicapped toilet?: Yes  TIMED UP AND GO:  Was the test performed?  No  Cognitive Function: 6CIT completed    10/18/2018    1:34 PM  MMSE - Mini Mental State Exam  Orientation to time 5  Orientation to Place 5  Registration 3  Attention/ Calculation 5  Recall 2  Language- name 2 objects 2  Language- repeat 1  Language- follow 3 step command 3  Language- read & follow direction 1  Write a sentence 1  Copy design 1  Total score 29        05/19/2024   10:47 AM 05/19/2023    1:05 PM 05/19/2022    8:35 AM 11/09/2019   10:38 AM  6CIT Screen  What Year? 0 points 0 points 0 points 0 points  What month? 0 points 0 points 0 points 0 points  What time? 0 points 0 points 0 points 0 points  Count back from 20 0 points 0 points 0 points 0 points  Months in reverse 0 points 0 points 0 points 0 points  Repeat phrase 0 points 0 points 0 points 0 points  Total Score 0 points 0 points 0 points 0 points    Immunizations Immunization History  Administered Date(s) Administered   Moderna Sars-Covid-2 Vaccination 10/06/2019, 11/04/2019, 06/23/2020, 12/13/2020    Screening Tests Health Maintenance  Topic Date Due   Influenza Vaccine  Never done   COVID-19 Vaccine (5 - 2025-26 season) 05/02/2024   DTaP/Tdap/Td (1 - Tdap) 12/29/2024 (Originally 02/07/1965)   Mammogram  03/14/2025   Medicare Annual Wellness (AWV)  05/19/2025   DEXA SCAN  12/30/2025   Colonoscopy  01/17/2027   Hepatitis C Screening  Completed   HPV VACCINES  Aged Out   Meningococcal B Vaccine  Aged Out   Pneumococcal Vaccine: 50+ Years  Discontinued   Zoster Vaccines- Shingrix  Discontinued    Health Maintenance Items Addressed: See Nurse Notes at the end of this note  Additional Screening:  Vision Screening: Recommended annual ophthalmology exams for early detection of glaucoma and other disorders of the eye. Is the patient up to date  with their annual eye exam?  Yes  Who is the provider or what is the name of the office in which the patient attends annual eye exams? [facility]   Dental Screening: Recommended annual dental exams for proper oral hygiene  Community Resource Referral / Chronic Care Management: CRR required this visit?  No   CCM required this visit?  No   Plan:    I have personally reviewed and noted the following in the patient's chart:   Medical and social history Use of alcohol, tobacco or illicit drugs  Current medications and supplements including opioid prescriptions. Patient is not currently taking opioid prescriptions. Functional ability and status Nutritional status Physical activity Advanced directives List of other physicians Hospitalizations, surgeries, and ER visits in previous  12 months Vitals Screenings to include cognitive, depression, and falls Referrals and appointments  In addition, I have reviewed and discussed with patient certain preventive protocols, quality metrics, and best practice recommendations. A written personalized care plan for preventive services as well as general preventive health recommendations were provided to patient.   Ozie Ned, CMA   05/19/2024   After Visit Summary: (MyChart) Due to this being a telephonic visit, the after visit summary with patients personalized plan was offered to patient via MyChart   Notes: Nothing significant to report at this time.

## 2024-05-31 ENCOUNTER — Other Ambulatory Visit: Payer: Self-pay | Admitting: Family Medicine

## 2024-05-31 DIAGNOSIS — N3281 Overactive bladder: Secondary | ICD-10-CM

## 2024-06-07 ENCOUNTER — Encounter: Payer: Self-pay | Admitting: *Deleted

## 2024-07-05 DIAGNOSIS — M2041 Other hammer toe(s) (acquired), right foot: Secondary | ICD-10-CM | POA: Diagnosis not present

## 2024-07-14 ENCOUNTER — Other Ambulatory Visit: Payer: Self-pay | Admitting: *Deleted

## 2024-07-14 ENCOUNTER — Other Ambulatory Visit: Payer: Self-pay | Admitting: Orthopedic Surgery

## 2024-07-14 DIAGNOSIS — M51369 Other intervertebral disc degeneration, lumbar region without mention of lumbar back pain or lower extremity pain: Secondary | ICD-10-CM

## 2024-07-14 DIAGNOSIS — J309 Allergic rhinitis, unspecified: Secondary | ICD-10-CM

## 2024-07-14 DIAGNOSIS — R051 Acute cough: Secondary | ICD-10-CM

## 2024-07-14 DIAGNOSIS — R0981 Nasal congestion: Secondary | ICD-10-CM

## 2024-07-14 DIAGNOSIS — M170 Bilateral primary osteoarthritis of knee: Secondary | ICD-10-CM

## 2024-09-05 ENCOUNTER — Ambulatory Visit: Payer: Self-pay

## 2024-09-05 ENCOUNTER — Ambulatory Visit
Admission: EM | Admit: 2024-09-05 | Discharge: 2024-09-05 | Disposition: A | Discharge status: Home / Self Care | Payer: Medicare (Managed Care) | Attending: Family Medicine | Admitting: Family Medicine

## 2024-09-05 DIAGNOSIS — R1011 Right upper quadrant pain: Secondary | ICD-10-CM

## 2024-09-05 DIAGNOSIS — J069 Acute upper respiratory infection, unspecified: Secondary | ICD-10-CM

## 2024-09-05 DIAGNOSIS — R030 Elevated blood-pressure reading, without diagnosis of hypertension: Secondary | ICD-10-CM | POA: Diagnosis not present

## 2024-09-05 MED ORDER — ALBUTEROL SULFATE HFA 108 (90 BASE) MCG/ACT IN AERS: 2.0000 | INHALATION_SPRAY | RESPIRATORY_TRACT | 0 refills | Status: AC | PRN

## 2024-09-05 MED ORDER — PROMETHAZINE-DM 6.25-15 MG/5ML PO SYRP: 5.0000 mL | ORAL_SOLUTION | Freq: Four times a day (QID) | ORAL | 0 refills | Status: AC | PRN

## 2024-09-05 NOTE — Telephone Encounter (Signed)
 Pt seen at urgent care today.   Copied from CRM [id number]. Topic: Clinical - Red Word Triage >> Sep 05, 2024  7:36 AM Harlene ORN wrote: Red Word that prompted transfer to Nurse Triage: sharp abdominal pain everytime she coughs; been coughing since Friday. Been getting worse >> Sep 05, 2024  9:25 AM Farrel B wrote: Pt called back stating she was still hurting and the place the NT had suggested was too far for her to drive, she stated she wanted to be seen today, placed on a wait list, then she changed her mind an requested the phone number for the urgent care clinic in [place]

## 2024-09-05 NOTE — Discharge Instructions (Signed)
 In addition to the prescribed medications, continue your Flonase  twice daily, use saline sinus rinses, humidifiers.  May also take Coricidin HBP, plain Mucinex  as needed.  Return for worsening or unresolving symptoms.

## 2024-09-05 NOTE — Telephone Encounter (Signed)
 FYI Only or Action Required?: Action required by provider: request for appointment. Wants to be seen in office only.   Patient was last seen in primary care on 12/30/2023 by Jolinda Norene HERO, DO.  Called Nurse Triage reporting sharp Abdominal Pain with cough. Pt started with cough on Friday - no fever. Pt has a very sharp intense pain with each cough. Abdominal swelling.  Symptoms began several days ago.  Interventions attempted: OTC medications: Mucinex  and Nyquil, also allergy medication.  Symptoms are: unchanged.  Triage Disposition: See HCP Within 4 Hours (Or PCP Triage)  Patient/caregiver understands and will follow disposition?: No, refuses disposition. Pt does not want to go to UC or another clinic.                 Copied from CRM [id number]. Topic: Clinical - Red Word Triage >> Sep 05, 2024  7:36 AM Harlene ORN wrote: Red Word that prompted transfer to Nurse Triage: sharp abdominal pain everytime she coughs; been coughing since Friday. Been getting worse Reason for Disposition  [1] MILD-MODERATE pain AND [2] constant AND [3] age > 60 years  Answer Assessment - Initial Assessment Questions 1. LOCATION: Where does it hurt?      Just above stomach 2. RADIATION: Does the pain shoot anywhere else? (e.g., chest, back)     Did not ask  3. ONSET: When did the pain begin? (e.g., minutes, hours or days ago)      Friday cough started. 4. SUDDEN: Gradual or sudden onset?     Sudden with each cough 5. PATTERN Does the pain come and go, or is it constant?     Comes and goes 6. SEVERITY: How bad is the pain?  (e.g., Scale 1-10; mild, moderate, or severe)     Moderate - severe 7. RECURRENT SYMPTOM: Have you ever had this type of stomach pain before? If Yes, ask: When was the last time? and What happened that time?      no 8. CAUSE: What do you think is causing the stomach pain? (e.g., gallstones, recent abdominal surgery)     cough 9.  RELIEVING/AGGRAVATING FACTORS: What makes it better or worse? (e.g., antacids, bending or twisting motion, bowel movement)     Did not ask 10. OTHER SYMPTOMS: Do you have any other symptoms? (e.g., back pain, diarrhea, fever, urination pain, vomiting)       Abdomen looks swollen  Protocols used: Abdominal Pain - Female-A-AH

## 2024-09-05 NOTE — Telephone Encounter (Signed)
 Returned patient call to advise urgent care, no voicemail.

## 2024-09-05 NOTE — ED Triage Notes (Signed)
 Pt reports pain in the right upper abdomen, has cough and sinus drainage that is causing the pain feels like being stabbed in the abdomen x 3 days.

## 2024-09-05 NOTE — Telephone Encounter (Signed)
Noted  -LS

## 2024-09-06 NOTE — ED Provider Notes (Signed)
 " [facility]    CSN: [id number] Arrival date & time: 09/05/24  1110      History   Chief Complaint No chief complaint on file.   HPI Tiffany Bradley is a 79 y.o. female.   Patient presenting today with 3-day history of cough, congestion, sinus drainage, chest tightness, and right upper quadrant stabbing pain during coughing episodes leaving after coughing episodes.  Denies fever, chest pain, nausea, vomiting, diarrhea, constipation, severe shortness of breath.  So far tried over-the-counter remedies with minimal relief.  No known history of chronic pulmonary disease.    Past Medical History:  Diagnosis Date   Arthritis    Back pain    Cataract    DJD (degenerative joint disease)    Full dentures    GERD (gastroesophageal reflux disease)    Glaucoma    History of bleeding ulcers    patient reports multiple hospitalizations remotely   History of total left hip replacement    Hypertension    Perforation of sigmoid colon (HCC) 06/29/2016   Pre-diabetes    Unilateral primary osteoarthritis, left hip 12/12/2021   Unilateral primary osteoarthritis, right knee 10/14/2022   Wears glasses     Patient Active Problem List   Diagnosis Date Noted   Allergic rhinitis 11/30/2023   Nasal congestion 11/30/2023   S/P TKR (total knee replacement), 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 11/21/2022   Primary osteoarthritis of right knee 10/14/2022   Status post left hip replacement 02/28/2022   Small bowel obstruction (HCC) 12/13/2021   History of retinal detachment 03/12/2020   Pure hypercholesterolemia 03/12/2020   PUD (peptic ulcer disease) 05/09/2019   OAB (overactive bladder) 05/09/2019   DDD (degenerative disc disease), lumbar 05/09/2019   Status post total knee replacement, 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 02/11/2018   Iron deficiency anemia 04/28/2017   Vitamin D  deficiency 04/28/2017   Gastritis, erosive    Essential hypertension 11/03/2016   Primary osteoarthritis of both knees 11/03/2016   GERD  (gastroesophageal reflux disease) 11/03/2016   Age-related osteoporosis without current pathological fracture 11/03/2016    Past Surgical History:  Procedure Laterality Date   BREAST REDUCTION SURGERY Bilateral 04/25/2013   Procedure: MAMMARY REDUCTION  (BREAST);  Surgeon: Elna Pick, MD;  Location: [facility];  Service: Plastics;  Laterality: Bilateral;   COLONOSCOPY     2008, normal per patient   COLONOSCOPY N/A 01/16/2017   Procedure: COLONOSCOPY;  Surgeon: Harvey Margo CROME, MD;  Location: [facility];  Service: Endoscopy;  Laterality: N/A;  830    COSMETIC SURGERY     ESOPHAGOGASTRODUODENOSCOPY N/A 01/16/2017   Procedure: ESOPHAGOGASTRODUODENOSCOPY (EGD);  Surgeon: Harvey Margo CROME, MD;  Location: [facility];  Service: Endoscopy;  Laterality: N/A;   PARTIAL COLECTOMY N/A 06/29/2016   Procedure: PARTIAL COLECTOMY;  Surgeon: Selinda Artist Moats, MD;  Location: [facility];  Service: General;  Laterality: N/A;   REDUCTION MAMMAPLASTY     TOTAL HIP ARTHROPLASTY Left 02/28/2022   Procedure: LEFT TOTAL HIP ARTHROPLASTY ANTERIOR APPROACH;  Surgeon: Vernetta Lonni GRADE, MD;  Location: [facility];  Service: Orthopedics;  Laterality: Left;   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   Procedure: TOTAL KNEE ARTHROPLASTY;  Surgeon: Margrette Taft BRAVO, MD;  Location: [facility];  Service: Orthopedics;  Laterality: Left;  DePuy    TOTAL KNEE ARTHROPLASTY Right 10/14/2022   Procedure: TOTAL KNEE ARTHROPLASTY;  Surgeon: Margrette Taft BRAVO, MD;  Location: [facility];  Service: Orthopedics;  Laterality: Right;   TUBAL LIGATION      OB  History     Gravida  3   Para  3   Term  3   Preterm      AB      Living  3      SAB      IAB      Ectopic      Multiple      Live Births               Home Medications    Prior to Admission medications  Medication Sig Start Date End Date Taking? Authorizing Provider  albuterol  (VENTOLIN  HFA) 108 (90 Base) MCG/ACT inhaler Inhale 2  puffs into the lungs every 4 (four) hours as needed. 09/05/24  Yes Stuart Vernell Norris, PA-C  promethazine -dextromethorphan (PROMETHAZINE -DM) 6.25-15 MG/5ML syrup Take 5 mLs by mouth 4 (four) times daily as needed. 09/05/24  Yes Stuart Vernell Norris, PA-C  amLODipine  (NORVASC ) 10 MG tablet Take 1 tablet (10 mg total) by mouth daily. 12/30/23   Jolinda Norene HERO, DO  aspirin  325 MG tablet Take 1 tablet (325 mg total) by mouth daily. 10/30/22   Margrette Taft BRAVO, MD  atorvastatin  (LIPITOR) 20 MG tablet Take 1 tablet (20 mg total) by mouth daily. 12/30/23   Jolinda Norene HERO, DO  Biotin  10 MG CAPS Take 10 mg of amoxicillin by mouth daily.    [provider]  Calcium  Carb-Cholecalciferol (CALCIUM  600+D3 PO) Take 1 tablet by mouth daily.    [provider]  Cholecalciferol (VITAMIN D ) 50 MCG (2000 UT) CAPS Take 2,000 Units by mouth daily.    [provider]  ferrous sulfate  325 (65 FE) MG tablet Take 325 mg by mouth daily.    [provider]  fluticasone  (FLONASE ) 50 MCG/ACT nasal spray Place 2 sprays into both nostrils daily. 11/30/23   [facility] Sebastian Pool, NP  Glucosamine HCl-MSM (GLUCOSAMINE-MSM PO) Take 2 tablets by mouth daily.    [provider]  levocetirizine (XYZAL ) 5 MG tablet TAKE 1/2 (ONE-HALF) TABLET BY MOUTH IN THE EVENING 07/15/24   Jolinda Norene M, DO  Multiple Vitamins-Minerals (MULTIVITAMIN WITH MINERALS) tablet Take 1 tablet by mouth daily. With Zinc    [provider]  omeprazole  (PRILOSEC) 40 MG capsule Take 1 capsule (40 mg total) by mouth daily. 12/30/23   Jolinda Norene HERO, DO  polyethylene glycol (MIRALAX ) 17 g packet Take 17 g by mouth daily. 12/16/21   Ricky Fines, MD  tiZANidine  (ZANAFLEX ) 4 MG tablet TAKE 1 TABLET BY MOUTH EVERY 8 HOURS AS NEEDED FOR MUSCLE SPASM 07/16/24   Margrette Taft BRAVO, MD  tolterodine  (DETROL  LA) 2 MG 24 hr capsule Take 1 capsule by mouth once daily 05/31/24   Jolinda Norene M, DO   torsemide  (DEMADEX ) 20 MG tablet Take 20 mg by mouth daily.    [provider]    Family History Family History  Problem Relation Age of Onset   Kidney disease Mother    Stroke Mother    Heart attack Father    Hyperlipidemia Sister    Hypertension Sister    Diabetes Sister    Hypertension Sister    Hyperlipidemia Sister    Hypertension Brother    Hyperlipidemia Brother    Diabetes Son    Colon cancer Neg Hx    Breast cancer Neg Hx     Social History Social History[1]   Allergies   Patient has no known allergies.   Review of Systems Review of Systems PER HPI  Physical Exam  Triage Vital Signs ED Triage Vitals  Encounter Vitals Group     BP 09/05/24 1241 (!) 144/81     Girls Systolic BP Percentile --      Girls Diastolic BP Percentile --      Boys Systolic BP Percentile --      Boys Diastolic BP Percentile --      Pulse Rate 09/05/24 1241 95     Resp 09/05/24 1241 18     Temp 09/05/24 1241 98.3 F (36.8 C)     Temp src --      SpO2 09/05/24 1241 93 %     Weight --      Height --      Head Circumference --      Peak Flow --      Pain Score 09/05/24 1243 0     Pain Loc --      Pain Education --      Exclude from Growth Chart --    No data found.  Updated Vital Signs BP (!) 144/81 (BP Location: Right Arm)   Pulse 95   Temp 98.3 F (36.8 C)   Resp 18   SpO2 93%   Visual Acuity Right Eye Distance:   Left Eye Distance:   Bilateral Distance:    Right Eye Near:   Left Eye Near:    Bilateral Near:     Physical Exam Vitals and nursing note reviewed.  Constitutional:      Appearance: Normal appearance.  HENT:     Head: Atraumatic.     Right Ear: Tympanic membrane and external ear normal.     Left Ear: Tympanic membrane and external ear normal.     Nose: Rhinorrhea present.     Mouth/Throat:     Mouth: Mucous membranes are moist.     Pharynx: Posterior oropharyngeal erythema present.  Eyes:     Extraocular Movements: Extraocular  movements intact.     Conjunctiva/sclera: Conjunctivae normal.  Cardiovascular:     Rate and Rhythm: Normal rate.  Pulmonary:     Effort: Pulmonary effort is normal.     Breath sounds: Normal breath sounds. No wheezing.  Abdominal:     General: Bowel sounds are normal. There is no distension.     Palpations: Abdomen is soft.     Tenderness: There is no abdominal tenderness. There is no right CVA tenderness, left CVA tenderness or guarding.  Musculoskeletal:        General: Normal range of motion.     Cervical back: Normal range of motion and neck supple.  Skin:    General: Skin is warm and dry.  Neurological:     Mental Status: She is alert and oriented to person, place, and time.  Psychiatric:        Mood and Affect: Mood normal.        Thought Content: Thought content normal.      UC Treatments / Results  Labs (all labs ordered are listed, but only abnormal results are displayed) Labs Reviewed - No data to display  EKG   Radiology No results found.  Procedures Procedures (including critical care time)  Medications Ordered in UC Medications - No data to display  Initial Impression / Assessment and Plan / UC Course  I have reviewed the triage vital signs and the nursing notes.  Pertinent labs & imaging results that were available during my care of the patient were reviewed by me and considered in my medical decision making (  see chart for details).     Minimally hypertensive in triage, otherwise vital signs reassuring.  List given of safe over-the-counter cold congestion medications that will not further elevate blood pressure.  Suspect right upper quadrant pain may be related to muscular soreness from coughing.  Exam reassuring today, no red flag findings.  Suspect viral respiratory infection.  Treat with albuterol , Phenergan  DM, supportive over-the-counter medications and home care.  Return for worsening or unresolving symptoms.  Final Clinical Impressions(s) / UC  Diagnoses   Final diagnoses:  Viral URI with cough  RUQ pain  Elevated blood pressure reading     Discharge Instructions      In addition to the prescribed medications, continue your Flonase  twice daily, use saline sinus rinses, humidifiers.  May also take Coricidin HBP, plain Mucinex  as needed.  Return for worsening or unresolving symptoms.    ED Prescriptions     Medication Sig Dispense Auth. Provider   albuterol  (VENTOLIN  HFA) 108 (90 Base) MCG/ACT inhaler Inhale 2 puffs into the lungs every 4 (four) hours as needed. 18 g Stuart Vernell Norris, PA-C   promethazine -dextromethorphan (PROMETHAZINE -DM) 6.25-15 MG/5ML syrup Take 5 mLs by mouth 4 (four) times daily as needed. 100 mL Stuart Vernell Norris, [place]      PDMP not reviewed this encounter.    [1]  Social History Tobacco Use   Smoking status: Former    Current packs/day: 0.00    Average packs/day: 3.0 packs/day    Types: Cigarettes    Quit date: 04/20/1993    Years since quitting: 31.4   Smokeless tobacco: Never  Vaping Use   Vaping status: Never Used  Substance Use Topics   Alcohol use: Not Currently   Drug use: No     Stuart Vernell Norris, PA-C 09/06/24 0801  "

## 2024-12-30 ENCOUNTER — Encounter: Admitting: Family Medicine

## 2025-01-11 ENCOUNTER — Encounter: Payer: Self-pay | Admitting: Family Medicine

## 2025-05-22 ENCOUNTER — Ambulatory Visit: Payer: Self-pay
